# Patient Record
Sex: Male | Born: 1945 | Race: White | Hispanic: No | Marital: Married | Smoking: Never smoker
Health system: Southern US, Community
[De-identification: ages and names within clinical notes are randomized; demographics above are authoritative.]

## PROBLEM LIST (undated history)

## (undated) DIAGNOSIS — I471 Supraventricular tachycardia: Secondary | ICD-10-CM

## (undated) DIAGNOSIS — C61 Malignant neoplasm of prostate: Secondary | ICD-10-CM

## (undated) DIAGNOSIS — G43909 Migraine, unspecified, not intractable, without status migrainosus: Secondary | ICD-10-CM

## (undated) DIAGNOSIS — I10 Essential (primary) hypertension: Secondary | ICD-10-CM

## (undated) DIAGNOSIS — I495 Sick sinus syndrome: Secondary | ICD-10-CM

## (undated) DIAGNOSIS — G250 Essential tremor: Secondary | ICD-10-CM

## (undated) DIAGNOSIS — E785 Hyperlipidemia, unspecified: Secondary | ICD-10-CM

## (undated) DIAGNOSIS — H8110 Benign paroxysmal vertigo, unspecified ear: Secondary | ICD-10-CM

## (undated) DIAGNOSIS — Z9289 Personal history of other medical treatment: Secondary | ICD-10-CM

## (undated) DIAGNOSIS — R32 Unspecified urinary incontinence: Secondary | ICD-10-CM

## (undated) DIAGNOSIS — I440 Atrioventricular block, first degree: Secondary | ICD-10-CM

## (undated) DIAGNOSIS — C801 Malignant (primary) neoplasm, unspecified: Secondary | ICD-10-CM

## (undated) DIAGNOSIS — H93A9 Pulsatile tinnitus, unspecified ear: Secondary | ICD-10-CM

## (undated) DIAGNOSIS — I4892 Unspecified atrial flutter: Principal | ICD-10-CM

## (undated) DIAGNOSIS — I2510 Atherosclerotic heart disease of native coronary artery without angina pectoris: Secondary | ICD-10-CM

## (undated) DIAGNOSIS — Z950 Presence of cardiac pacemaker: Secondary | ICD-10-CM

## (undated) HISTORY — PX: INGUINAL HERNIA REPAIR: SUR1180

## (undated) HISTORY — DX: Pulsatile tinnitus, unspecified ear: H93.A9

## (undated) HISTORY — DX: Hyperlipidemia, unspecified: E78.5

## (undated) HISTORY — DX: Unspecified urinary incontinence: R32

## (undated) HISTORY — PX: CARDIAC CATHETERIZATION: SHX172

## (undated) HISTORY — PX: TONSILLECTOMY AND ADENOIDECTOMY: SUR1326

## (undated) HISTORY — DX: Supraventricular tachycardia: I47.1

## (undated) HISTORY — PX: COLONOSCOPY: SHX174

## (undated) HISTORY — PX: SKIN CANCER EXCISION: SHX779

## (undated) HISTORY — DX: Essential (primary) hypertension: I10

## (undated) HISTORY — PX: SHOULDER SURGERY: SHX246

## (undated) HISTORY — DX: Essential tremor: G25.0

---

## 1979-07-18 HISTORY — PX: HERNIA REPAIR: SHX51

## 1984-02-25 ENCOUNTER — Encounter: Payer: Self-pay | Admitting: Internal Medicine

## 1984-08-19 ENCOUNTER — Encounter: Payer: Self-pay | Admitting: Internal Medicine

## 1984-08-29 ENCOUNTER — Encounter: Payer: Self-pay | Admitting: Internal Medicine

## 2001-01-21 ENCOUNTER — Ambulatory Visit (HOSPITAL_COMMUNITY): Admission: RE | Admit: 2001-01-21 | Discharge: 2001-01-21 | Payer: Self-pay | Admitting: Orthopedic Surgery

## 2001-01-21 ENCOUNTER — Encounter: Payer: Self-pay | Admitting: Orthopedic Surgery

## 2002-12-05 ENCOUNTER — Ambulatory Visit (HOSPITAL_COMMUNITY): Admission: RE | Admit: 2002-12-05 | Discharge: 2002-12-05 | Payer: Self-pay | Admitting: Orthopedic Surgery

## 2002-12-05 ENCOUNTER — Encounter: Payer: Self-pay | Admitting: Orthopedic Surgery

## 2003-02-06 ENCOUNTER — Encounter (INDEPENDENT_AMBULATORY_CARE_PROVIDER_SITE_OTHER): Payer: Self-pay | Admitting: *Deleted

## 2003-02-06 ENCOUNTER — Ambulatory Visit (HOSPITAL_BASED_OUTPATIENT_CLINIC_OR_DEPARTMENT_OTHER): Admission: RE | Admit: 2003-02-06 | Discharge: 2003-02-06 | Payer: Self-pay | Admitting: Orthopedic Surgery

## 2005-06-16 ENCOUNTER — Encounter: Admission: RE | Admit: 2005-06-16 | Discharge: 2005-06-16 | Payer: Self-pay | Admitting: Cardiology

## 2005-11-16 DIAGNOSIS — I471 Supraventricular tachycardia: Secondary | ICD-10-CM

## 2005-11-16 DIAGNOSIS — I4719 Other supraventricular tachycardia: Secondary | ICD-10-CM

## 2005-11-16 DIAGNOSIS — I4892 Unspecified atrial flutter: Principal | ICD-10-CM

## 2005-11-16 HISTORY — DX: Supraventricular tachycardia: I47.1

## 2005-11-16 HISTORY — DX: Other supraventricular tachycardia: I47.19

## 2005-11-16 HISTORY — DX: Unspecified atrial flutter: I48.92

## 2006-01-25 ENCOUNTER — Ambulatory Visit: Payer: Self-pay | Admitting: Gastroenterology

## 2006-01-29 ENCOUNTER — Encounter (INDEPENDENT_AMBULATORY_CARE_PROVIDER_SITE_OTHER): Payer: Self-pay | Admitting: Specialist

## 2006-01-29 ENCOUNTER — Ambulatory Visit: Payer: Self-pay | Admitting: Gastroenterology

## 2006-05-14 ENCOUNTER — Ambulatory Visit: Payer: Self-pay | Admitting: Sports Medicine

## 2006-06-11 ENCOUNTER — Ambulatory Visit (HOSPITAL_COMMUNITY): Admission: RE | Admit: 2006-06-11 | Discharge: 2006-06-11 | Payer: Self-pay | Admitting: Family Medicine

## 2006-06-11 ENCOUNTER — Ambulatory Visit: Payer: Self-pay | Admitting: Sports Medicine

## 2008-11-16 DIAGNOSIS — Z9289 Personal history of other medical treatment: Secondary | ICD-10-CM

## 2008-11-16 HISTORY — DX: Personal history of other medical treatment: Z92.89

## 2009-01-02 ENCOUNTER — Ambulatory Visit (HOSPITAL_COMMUNITY): Admission: RE | Admit: 2009-01-02 | Discharge: 2009-01-02 | Payer: Self-pay | Admitting: Urology

## 2009-02-20 ENCOUNTER — Inpatient Hospital Stay (HOSPITAL_COMMUNITY): Admission: RE | Admit: 2009-02-20 | Discharge: 2009-02-25 | Payer: Self-pay | Admitting: Urology

## 2009-02-20 ENCOUNTER — Encounter (INDEPENDENT_AMBULATORY_CARE_PROVIDER_SITE_OTHER): Payer: Self-pay | Admitting: Urology

## 2009-02-20 HISTORY — PX: PROSTATECTOMY: SHX69

## 2009-06-21 ENCOUNTER — Ambulatory Visit (HOSPITAL_COMMUNITY): Admission: RE | Admit: 2009-06-21 | Discharge: 2009-06-21 | Payer: Self-pay | Admitting: Cardiology

## 2010-02-11 ENCOUNTER — Encounter: Admission: RE | Admit: 2010-02-11 | Discharge: 2010-02-11 | Payer: Self-pay | Admitting: Otolaryngology

## 2010-08-22 ENCOUNTER — Ambulatory Visit: Payer: Self-pay | Admitting: Cardiology

## 2010-10-06 ENCOUNTER — Ambulatory Visit: Payer: Self-pay | Admitting: Cardiology

## 2011-02-22 LAB — PROTIME-INR: INR: 1.5 (ref 0.00–1.49)

## 2011-02-23 ENCOUNTER — Encounter: Payer: Self-pay | Admitting: Gastroenterology

## 2011-02-25 LAB — CBC
HCT: 28.7 % — ABNORMAL LOW (ref 39.0–52.0)
HCT: 29.8 % — ABNORMAL LOW (ref 39.0–52.0)
Hemoglobin: 9.8 g/dL — ABNORMAL LOW (ref 13.0–17.0)
MCHC: 34 g/dL (ref 30.0–36.0)
MCHC: 34.2 g/dL (ref 30.0–36.0)
MCHC: 34.5 g/dL (ref 30.0–36.0)
MCV: 90.4 fL (ref 78.0–100.0)
MCV: 90.5 fL (ref 78.0–100.0)
MCV: 90.9 fL (ref 78.0–100.0)
Platelets: 145 10*3/uL — ABNORMAL LOW (ref 150–400)
Platelets: 161 10*3/uL (ref 150–400)
RBC: 3.18 MIL/uL — ABNORMAL LOW (ref 4.22–5.81)
RBC: 3.2 MIL/uL — ABNORMAL LOW (ref 4.22–5.81)
RBC: 4.5 MIL/uL (ref 4.22–5.81)
RDW: 13.4 % (ref 11.5–15.5)
RDW: 13.4 % (ref 11.5–15.5)
RDW: 13.7 % (ref 11.5–15.5)
WBC: 12.7 10*3/uL — ABNORMAL HIGH (ref 4.0–10.5)
WBC: 8 10*3/uL (ref 4.0–10.5)

## 2011-02-25 LAB — HEMOGLOBIN AND HEMATOCRIT, BLOOD
HCT: 29.5 % — ABNORMAL LOW (ref 39.0–52.0)
Hemoglobin: 10 g/dL — ABNORMAL LOW (ref 13.0–17.0)
Hemoglobin: 11.1 g/dL — ABNORMAL LOW (ref 13.0–17.0)
Hemoglobin: 12 g/dL — ABNORMAL LOW (ref 13.0–17.0)

## 2011-02-25 LAB — DIFFERENTIAL
Basophils Absolute: 0 10*3/uL (ref 0.0–0.1)
Basophils Relative: 0 % (ref 0–1)
Eosinophils Relative: 0 % (ref 0–5)
Lymphocytes Relative: 10 % — ABNORMAL LOW (ref 12–46)
Lymphocytes Relative: 11 % — ABNORMAL LOW (ref 12–46)
Lymphs Abs: 1.4 10*3/uL (ref 0.7–4.0)
Monocytes Relative: 6 % (ref 3–12)
Neutro Abs: 10.6 10*3/uL — ABNORMAL HIGH (ref 1.7–7.7)
Neutrophils Relative %: 83 % — ABNORMAL HIGH (ref 43–77)

## 2011-02-25 LAB — BASIC METABOLIC PANEL
BUN: 8 mg/dL (ref 6–23)
BUN: 9 mg/dL (ref 6–23)
CO2: 27 mEq/L (ref 19–32)
CO2: 27 mEq/L (ref 19–32)
Calcium: 8.1 mg/dL — ABNORMAL LOW (ref 8.4–10.5)
Calcium: 8.3 mg/dL — ABNORMAL LOW (ref 8.4–10.5)
Calcium: 8.5 mg/dL (ref 8.4–10.5)
Chloride: 103 mEq/L (ref 96–112)
Chloride: 105 mEq/L (ref 96–112)
Chloride: 106 mEq/L (ref 96–112)
Creatinine, Ser: 1.11 mg/dL (ref 0.4–1.5)
Creatinine, Ser: 1.14 mg/dL (ref 0.4–1.5)
GFR calc Af Amer: >60 mL/min (ref >60)
GFR calc Af Amer: >60 mL/min (ref >60)
GFR calc Af Amer: >60 mL/min (ref >60)
GFR calc non Af Amer: >60 mL/min (ref >60)
GFR calc non Af Amer: >60 mL/min (ref >60)
GFR calc non Af Amer: >60 mL/min (ref >60)
Glucose, Bld: 135 mg/dL — ABNORMAL HIGH (ref 70–99)
Glucose, Bld: 147 mg/dL — ABNORMAL HIGH (ref 70–99)
Potassium: 4.1 mEq/L (ref 3.5–5.1)
Sodium: 136 mEq/L (ref 135–145)

## 2011-02-25 LAB — CLOSTRIDIUM DIFFICILE EIA

## 2011-02-25 LAB — HEMATOCRIT, BODY FLUID: Hematocrit, Fluid: 16.8 %

## 2011-02-25 LAB — TYPE AND SCREEN

## 2011-02-25 LAB — ABO/RH: ABO/RH(D): O NEG

## 2011-02-25 LAB — PREPARE RBC (CROSSMATCH)

## 2011-02-25 LAB — APTT: aPTT: 26 seconds (ref 24–37)

## 2011-02-26 LAB — URINALYSIS, ROUTINE W REFLEX MICROSCOPIC
Glucose, UA: NEGATIVE mg/dL
Hgb urine dipstick: NEGATIVE
Protein, ur: NEGATIVE mg/dL
Specific Gravity, Urine: 1.013 (ref 1.005–1.030)
pH: 7 (ref 5.0–8.0)

## 2011-02-26 LAB — APTT: aPTT: 31 seconds (ref 24–37)

## 2011-02-26 LAB — CBC
HCT: 45.3 % (ref 39.0–52.0)
Hemoglobin: 15.1 g/dL (ref 13.0–17.0)
MCV: 90.8 fL (ref 78.0–100.0)
RBC: 4.99 MIL/uL (ref 4.22–5.81)
WBC: 4.6 10*3/uL (ref 4.0–10.5)

## 2011-02-26 LAB — COMPREHENSIVE METABOLIC PANEL
Alkaline Phosphatase: 102 U/L (ref 39–117)
BUN: 13 mg/dL (ref 6–23)
CO2: 30 mEq/L (ref 19–32)
Chloride: 104 mEq/L (ref 96–112)
Creatinine, Ser: 1.11 mg/dL (ref 0.4–1.5)
GFR calc non Af Amer: >60 mL/min (ref >60)
Glucose, Bld: 103 mg/dL — ABNORMAL HIGH (ref 70–99)
Total Bilirubin: 2.1 mg/dL — ABNORMAL HIGH (ref 0.3–1.2)

## 2011-02-26 LAB — PROTIME-INR: INR: 1 (ref 0.00–1.49)

## 2011-03-16 ENCOUNTER — Encounter: Payer: Self-pay | Admitting: Cardiology

## 2011-03-16 DIAGNOSIS — I10 Essential (primary) hypertension: Secondary | ICD-10-CM | POA: Insufficient documentation

## 2011-03-16 DIAGNOSIS — R32 Unspecified urinary incontinence: Secondary | ICD-10-CM | POA: Insufficient documentation

## 2011-03-16 DIAGNOSIS — E785 Hyperlipidemia, unspecified: Secondary | ICD-10-CM | POA: Insufficient documentation

## 2011-03-16 DIAGNOSIS — G250 Essential tremor: Secondary | ICD-10-CM | POA: Insufficient documentation

## 2011-03-16 DIAGNOSIS — H93A9 Pulsatile tinnitus, unspecified ear: Secondary | ICD-10-CM | POA: Insufficient documentation

## 2011-03-16 DIAGNOSIS — I471 Supraventricular tachycardia: Secondary | ICD-10-CM | POA: Insufficient documentation

## 2011-03-16 DIAGNOSIS — I4892 Unspecified atrial flutter: Secondary | ICD-10-CM | POA: Insufficient documentation

## 2011-03-23 ENCOUNTER — Ambulatory Visit (INDEPENDENT_AMBULATORY_CARE_PROVIDER_SITE_OTHER): Payer: Medicare Other | Admitting: Cardiology

## 2011-03-23 ENCOUNTER — Encounter: Payer: Self-pay | Admitting: Cardiology

## 2011-03-23 DIAGNOSIS — Z8679 Personal history of other diseases of the circulatory system: Secondary | ICD-10-CM

## 2011-03-23 DIAGNOSIS — I10 Essential (primary) hypertension: Secondary | ICD-10-CM

## 2011-03-23 MED ORDER — PROPRANOLOL HCL 10 MG PO TABS: ORAL_TABLET | ORAL | Status: DC

## 2011-03-23 MED ORDER — TRIAMTERENE-HCTZ 37.5-25 MG PO TABS: 1.0000 | ORAL_TABLET | ORAL | Status: DC

## 2011-03-23 NOTE — Progress Notes (Signed)
Subjective:   [name] is seen today for followup visit. He has a history of atrial flutter with persistence sinus rhythm after cardioversion and flutter ablation in 2007. He's had a history of hyperlipemia as well as hypertension. In general, he's done well. He did have a complicated prostatectomy in April of 2010 and has more or less recovered. He noted that lisinopril caused an unusual side effect of anal sphincter tone reduction. In general, he's been doing well and exercising regularly without complaint .  Few months ago, he had some pauses in his heart rhythm without recurrence.  Current Outpatient Prescriptions  Medication Sig Dispense Refill  . aspirin 325 MG tablet Take 81 mg by mouth daily.       . Cholecalciferol (VITAMIN D PO) Take 1,000 mg by mouth daily.       . Coenzyme Q10 (COQ10 PO) Take by mouth daily.        . Cyanocobalamin (B-12 PO) Take 1,000 mg by mouth daily.       . propranolol (INDERAL) 10 MG tablet Take 10 mg by mouth daily.        Marland Kitchen triamterene-hydrochlorothiazide (MAXZIDE-25) 37.5-25 MG per tablet Take 1 tablet by mouth every other day.          No Known Allergies  Patient Active Problem List  Diagnoses  . Hyperlipidemia  . Pulsatile tinnitus  . Incontinence  . History of atrial flutter  . Familial tremor  . Atrial tachycardia  . Hypertension    History  Smoking status  . Never Smoker   Smokeless tobacco  . Not on file    History  Alcohol Use No    No family history on file.  Review of Systems:   The patient denies any heat or cold intolerance.  No weight gain or weight loss.  The patient denies headaches or blurry vision.  There is no cough or sputum production.  The patient denies dizziness.  There is no hematuria or hematochezia.  The patient denies any muscle aches or arthritis.  The patient denies any rash.  The patient denies frequent falling or instability.  There is no history of depression or anxiety.  All other systems were reviewed and are  negative.   Physical Exam:   Weight is 193. Blood pressure is 130/78 sitting, heart rate 56.The head is normocephalic and atraumatic.  Pupils are equally round and reactive to light.  Sclerae nonicteric.  Conjunctiva is clear.  Oropharynx is unremarkable.  There's adequate oral airway.  Neck is supple there are no masses.  Thyroid is not enlarged.  There is no lymphadenopathy.  Lungs are clear.  Chest is symmetric.  Heart shows a regular rate and rhythm.  S1 and S2 are normal.  There is no murmur click or gallop.  Abdomen is soft normal bowel sounds.  There is no organomegaly.  Genital and rectal deferred.  Extremities are without edema.  Peripheral pulses are adequate.  Neurologically intact.  Full range of motion.  The patient is not depressed.  Skin is warm and dry.  Assessment / Plan:

## 2011-03-23 NOTE — Assessment & Plan Note (Signed)
I will ask him to see Dr. Graciela Husbands in followup in 6 or 8 months. He sees Dr. Theressa Millard for primary care otherwise. No change her current medicines.

## 2011-03-23 NOTE — Assessment & Plan Note (Signed)
Currently stable.

## 2011-03-31 NOTE — Op Note (Signed)
Walter, [name] NO.:  [id number]   MEDICAL RECORD NO.:  [id number]          PATIENT TYPE:  INP   LOCATION:  1443                         FACILITY:  [facility]   PHYSICIAN:  Ronald L. Earlene Plater, M.D.  DATE OF BIRTH:  02/13/1946   DATE OF PROCEDURE:  02/20/2009  DATE OF DISCHARGE:                               OPERATIVE REPORT   DIAGNOSIS:  Adenocarcinoma of the prostate.   OPERATIVE PROCEDURE:  Robotic-assisted laparoscopic radical  prostatectomy with bilateral pelvic lymphadenectomy.   ASSISTANT:  Delia Chimes, NPC.   ANESTHESIA:  General endotracheal.   ESTIMATED BLOOD LOSS:  100 mL.   TUBES:  A large round Blake drain and a 20-French Foley catheter.   COMPLICATIONS:  None.   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  He had a free-to-total  ratio of 19.3%.  he subsequently underwent ultrasound and biopsy of the  prostate which revealed multiple foci of adenocarcinoma.  On the right  side it was primarily Gleason score 6 = 3 +3, and on the left side at  the left lateral mid there was a 10% in one core of 3 + 4 = 7.  There  was another 3 + 3 = 6 from the left side.  His metastatic workup was  negative.  He has been cleared for surgery by Dr. Roger Shelter, his  cardiologist and after understanding the risks, benefits, and  alternatives has elected to proceed with the above procedure.   PROCEDURE IN DETAIL:  The patient was placed in the supine position,  after proper general endotracheal anesthesia, was placed in the  exaggerated lithotomy position and prepped and draped with Betadine in a  sterile fashion.  A 24-French Foley catheter was inserted with a 30-mL  balloon and the bladder was drained.  A periumbilical incision was made  at the proper level.  Sharp dissection was carried down through the  fasciae, and the peritoneum was punctured.  A 12-mm cannula was placed  for the  camera port, and the peritoneum was examined.  There were some  adhesions approximately, but they were not interfering with the  procedure, and no significant abnormalities were noted.  Under direct  vision utilizing the normal procedure, both right and left robotic arms  were placed along with 5-mm and 12-mm right-sided working ports and a  fourth arm was placed in the left abdominal wall.  The robot was  coupled, and the procedure was begun.  Approximately 300 mL of sterile  saline was placed into the bladder.  Both the median and medial  umbilical ligaments were taken down with the hot shears, and the bladder  flap was created.  The space of Retzius was entered and dissected free.  The endopelvic fascia was entered bilaterally and dissected to the apex  of the prostate.  At the left apex there was an accessory artery that  was penetrating, and this had to be clipped and taken to free up the  apex of the prostate.  The  puboprostatic ligaments were partially  incised.  The deep dorsal vein complex was then clamped and cut  utilizing an endovascular stapler, and the prostate was released.  One  ampule of indigo carmine was given IV.   The bladder neck was then taken from the prostate carefully, both  anteriorly and posteriorly.  The ampullae of vas deferens were  identified and incised and the seminal vesicles were carefully  dissected, coagulating them, but carefully avoiding overcoagulation to  prevent damage to the nerves.  The Denonvillier fascia was then taken  down posteriorly, lifting separate to the apex of the prostate.  Both  the right and left nerve spare was then performed with the cold shears  down under the pedicle of the prostate and dissected to the apex of the  prostate.  The pedicles were then taken in serial packets, clipped, and  dissection was carried to the apex of the prostatic.  The urethra was  then sharply incised, and the specimen was removed.  Good hemostasis  was  present.  The area was irrigated.  The rectum was insufflated, and there  were no leaks noted.   Next, the right and left pelvic lymphadenectomy was performed.  Both  obturator and external iliac and hypogastric lymph nodes were taken.  They were clipped proximally and distally.  The vessels and the  obturator nerves were identified and protected bilaterally.  Following  this, the bladder neck/urethral anastomosis was then performed.  The  bladder neck was approximated to the urethra with a holding stitch.  It  might be noted that the bladder neck began to tear.  Therefore, a figure-  of-eight 3-0 Vicryl suture was placed in the bladder neck to buttress it  up, and then with a 2-0 Vicryl suture in the 6 o'clock position, the  bladder neck was approximated to the urethra.  The anastomosis was then  performed with 3-0 Monocryl suture, both dyed and undyed, tied  posteriorly on to the bladder and they were tied anteriorly.  The  anastomosis appeared to be watertight.  It was irrigated.  Blue dye was  seen coming from the bladder.  Good hemostasis was noted to be present.   The specimen was grasped with an EndoCatch bag.  A large round Blake  drain was placed through the fourth arm port and sutured in place with  nylon.  The right 12-mm working port was then closed under direct vision  with 2-0 Vicryl suture with a suture passer, and all ports were removed  under direct vision and good hemostasis was present.  There were no  other abnormalities noted throughout the abdomen, and the drain was in  good position.  The camera was removed, and the specimen was removed  through the camera port.  The fascia of the periumbilical incision was  then closed with a running 2-0 Vicryl suture.  All wounds were  irrigated, injected with 0.25% Marcaine, and closed with skin staples,  and dressed sterilely.  The bladder was noted again easily irrigated  with blue dye from the indigo carmine.  The  patient tolerated the  procedure well.  No complications.  The specimen was submitted  pathology.      Ronald L. Earlene Plater, M.D.  Electronically Signed     RLD/MEDQ  D:  02/20/2009  T:  02/20/2009  Job:  [id number]

## 2011-03-31 NOTE — Cardiovascular Report (Signed)
NAMEKONG, PACKETT NO.:  [id number]   MEDICAL RECORD NO.:  [id number]          PATIENT TYPE:  OIB   LOCATION:  2899                         FACILITY:  [facility]   PHYSICIAN:  Colleen Can. Deborah Chalk, M.D.DATE OF BIRTH:  [birth year]   DATE OF PROCEDURE:  06/21/2009  DATE OF DISCHARGE:                            CARDIAC CATHETERIZATION   PROCEDURE:  Cardioversion.   ANESTHESIA:  Maren Beach, MD, with 60 mg propofol.   PROCEDURE:  Using anterior posterior pads, 20 watt seconds of biphasic  energy were delivered in synchronized manner with conversion to normal  sinus rhythm.  He had underlying first-degree AV block.  Overall, he  tolerated the procedure well.      Colleen Can. Deborah Chalk, M.D.  Electronically Signed     SNT/MEDQ  D:  06/21/2009  T:  06/21/2009  Job:  [id number]

## 2011-03-31 NOTE — H&P (Signed)
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 Deborah Chalk, M.D.DATE OF BIRTH:  [birth year]   DATE OF ADMISSION:  06/21/2009  DATE OF DISCHARGE:                              HISTORY & PHYSICAL   CHIEF COMPLAINT:  Recurrent atrial flutter.   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  He has had previous  ablations in the past.  He presented to our office as a work-in  appointment with recurrent arrhythmia on Wednesday, June 19, 2009.  His  rate has been controlled.  Clinically, he feels fairly well.  He does  note a sensation of palpitations and irregular heartbeat.  He is now  referred for cardioversion.  He has not had any chest pain or shortness  of breath.   PAST MEDICAL HISTORY:  1. He has had a history of atypical chest pain in the past and had      cardiac catheterization many years ago because of a positive stress      study in 1985.  He has had a CT of the coronaries in 2008 which      showed mild nonocclusive calcified plaque in the right coronary and      mild nonocclusive plaque in the mid LAD with distal circumflex      coronary plaque.  2. Hyperlipidemia.  3. Atrial arrhythmias.  He has been referred to [facility] for what was felt to be an ectopic atrial arrhythmia and      had an ablative procedure in 1997.  He has subsequently had an      ablation in 2006 and again in 2007 per Dr. Ronn Melena in [place].  4. Kyphosis.  5. History of T&A in 1950.  6. Inguinal hernia repair in 1946.  7. Umbilical hernia repair in 1989.  8. Prostate cancer status post prostatectomy in April 2010.   ALLERGIES:  Questionable ADVIL but within the setting of taking CECLOR  he subsequently had a rash.   CURRENT MEDICATIONS:  1. Inderal 10  mg a day.  2. Baby aspirin daily.   FAMILY HISTORY:  Father died at 31 with suicide following a stroke.  He  had had a heart attack at the age 45.   SOCIAL HISTORY:  He is married.  He is a retired [employer] social research officer.  He has  no tobacco use.  He has rare alcohol use.   REVIEW OF SYSTEMS:  He denies chest pain or shortness of breath.  He  does note irregular heartbeat and palpitations.  He has had some  incontinence and diarrhea since his prostate surgery.  He has had some  issues with rash.  All other review of systems are negative.   PHYSICAL EXAMINATION:  GENERAL:  He is in no acute distress, he is  pleasant, and conversive.  VITAL SIGNS:  Weight is 195 pounds, blood pressure 120/80 sitting,  118/76 standing.  Heart rate in the 80s and irregular, respirations 80,  and is afebrile.  SKIN:  Warm and dry.  Color is unremarkable.  HEENT:  He is normocephalic and atraumatic.  Pupils are equal and  reactive.  Sclerae is nonicteric.  NECK:  Supple.  No masses.  No JVD.  LUNGS:  Clear.  HEART:  Irregularly irregular rhythm.  ABDOMEN:  Soft, positive bowel sounds, and nontender.  EXTREMITIES:  Without edema.  NEUROLOGIC:  No gross focal deficits.  MUSCULOSKELETAL:  Gait and range of motion to be intact.  Strength is  symmetric.   LABORATORY DATA:  Pertinent labs are pending.   OVERALL IMPRESSION:  1. Recurrent atrial flutter with controlled ventricular response.  2. Past history of ablation.  3. History of prostate cancer status post recent prostatectomy.   PLAN:  We will proceed on with attempts at cardioversion.  We will try  to arrange that for this coming Friday.  He is started on Lovenox 100 mg  b.i.d. as well as Coumadin 5 mg a day.  Procedure has been reviewed in  full detail and he is willing to proceed on Friday, June 21, 2009.      Sharlee Blew, N.P.      Colleen Can. Deborah Chalk, M.D.  Electronically Signed    LC/MEDQ  D:  06/19/2009  T:  06/20/2009  Job:   [id number]

## 2011-03-31 NOTE — Op Note (Signed)
[name], BONEBRAKE NO.:  [id number]   MEDICAL RECORD NO.:  [id number]          PATIENT TYPE:  INP   LOCATION:  1443                         FACILITY:  [facility]   PHYSICIAN:  Ronald L. Earlene Plater, M.D.  DATE OF BIRTH:  July 08, 1946   DATE OF PROCEDURE:  02/21/2009  DATE OF DISCHARGE:                               OPERATIVE REPORT   DIAGNOSIS:  Hematoma post robotic assisted laparoscopic radical  prostatectomy.   OPERATIVE PROCEDURE:  Exploratory laparotomy with evacuation of clot.   SURGEON:  Dr. Gaynelle Arabian.   ASSISTANTS:  Heloise Purpura, MD and Alessandra Bevels. Chase Picket, FNP-C.   ANESTHESIA:  General endotracheal.   ESTIMATED BLOOD LOSS:  500 mL of old clot.   COMPLICATIONS:  None.   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  He underwent a robotic  assisted laparoscopic radical prostatectomy and bilateral pelvic  lymphadenectomy yesterday.  Over the night, his drainage had increased  and his hemoglobin fell to approximately 10.  He became orthostatic and  was transfused.  CT scan revealed a moderate to large hematoma overlying  the bladder.  He had discomfort and with orthostasis and the fact that  we needed to transfuse him, after understanding the risks, benefits and  alternatives, he has elected to proceed with the above procedure.   PROCEDURE IN DETAIL:  The patient was placed in the supine position.  After proper general endotracheal anesthesia, he was placed in the  supine position and prepped and draped with Betadine in a sterile  fashion.  A midline lower abdominal incision was made.  Sharp dissection  was carried down through subcutaneous tissue.  The fascia was incised.  The rectus muscle bellies were retracted laterally and the peritoneum  was entered.  Approximately 500 mL of old clots were noted in the right  and left gutters and posterior to the bladder.  Anterior bladder  and  bladder neck area and anastomotic area appeared to be without clots.  Clots were evacuated and all areas of surgery were examined.  There was  no clot noted in the area of lymph node dissection.  All port sites were  examined and noted to be without clots and there was no active bleeding  noted to be coming from the bladder or the bladder flap.  Thorough  irrigation was performed with warm normal saline and the drain was  maintained in place.  The drain site was also identified and noted be  not actively bleeding.  The bladder flap was placed over the  retroperitoneum.  Again irrigation was performed and the wound was  closed.  The rectus muscle bellies were approximated in the midline with  interrupted 2-0 chromic catgut.  The fascia was closed with a running #1  PDS.  The area was irrigated.  The skin was closed with skin staples.  The  wounds were dressed sterilely and the patient was taken to the recovery  room stable.  All lap, sponge, instrument and needle counts were  correct.  The  Foley was irrigated and noted to be clear.  The patient  was taken to the recovery room stable.      Ronald L. Earlene Plater, M.D.  Electronically Signed     RLD/MEDQ  D:  02/21/2009  T:  02/21/2009  Job:  [id number]

## 2011-04-03 NOTE — Op Note (Signed)
[name], [name]                              ACCOUNT NO.:  [id number]   MEDICAL RECORD NO.:  [id number]                   PATIENT TYPE:  AMB   LOCATION:  DSC                                  FACILITY:  [facility]   PHYSICIAN:  Katy Fitch. Naaman Plummer., M.D.          DATE OF BIRTH:  September 20, 1946   DATE OF PROCEDURE:  02/06/2003  DATE OF DISCHARGE:                                 OPERATIVE REPORT   PREOPERATIVE DIAGNOSIS:  Chronic stage II or III impingement, right shoulder  with plain film and MRI evidence of significant acromioclavicular  arthropathy and significant tendinopathy of the supraspinatus and  infraspinatus tendons.   POSTOPERATIVE DIAGNOSIS:  1. Chronic stage II or III impingement, right shoulder with plain film and     MRI evidence of significant acromioclavicular arthropathy and significant     tendinopathy of the supraspinatus and infraspinatus tendons.  2. Minor adhesive capsulitis and partial deep surface tear of supraspinatus     and infraspinatus tendons.   OPERATION PERFORMED:  1. Diagnostic arthroscopy, right glenohumeral joint with arthroscopic     debridement of minor labral debris, anterior capsular adhesions and     debridement of deep surface supraspinatus and infraspinatus rotator cuff     tears.  2. Arthroscopic subacromial decompression with coracoacromial ligament     resection, acromioplasty and bursectomy.  3. Open resection of distal clavicle, i.e. Mumford procedure.   SURGEON:  Katy Fitch. Sypher, M.D.   ASSISTANT:  Jonni Sanger, P.A.   ANESTHESIA:  General orotracheal anesthesia supplemented by interscalene  block.   SUPERVISING ANESTHESIOLOGIST:  Janetta Hora. Gelene Mink, M.D.   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  He has had a history of  bilateral impingement symptoms and is status post arthroscopic subacromial  decompression and distal clavicle resection on the  left.  He had a  satisfactory response to surgery on the left and recently returned with  symptoms of impingement on the right.   Clinical examination revealed signs of significant AC arthropathy and  chronic stage II impingement.  An MRI of his shoulder was obtained December 09, 2002 at [facility].  This was interpreted to reveal  subacromial spurring with acromial sloping anteriorly and signs of chronic  impingement.  Due to a chronic stage II impingement predicament, the patient  requested proceeding with arthroscopic decompression at this time.   Give the fact that he is right-hand dominant and would like to return to  surgery as soon as possible, I recommended proceeding with open resection of  the distal clavicle and repair of the dead space between the deltoid and  trapezius utilizing Kevlar sutures to facilitate early mobilization of the  shoulder.   DESCRIPTION OF PROCEDURE:  The patient was brought to the operating room and  placed in supine position upon the operating table.  Following interscalene  block in the holding area, anesthesia was satisfactory in the right  forequarter.  Following the induction of general orotracheal anesthesia, he  was carefully positioned in a beach chair position with the aid of a torso  and headholder designed for shoulder arthroscopy.  The right upper extremity  and forequarter were prepped with DuraPrep and draped with impervious  arthroscopy drapes.   The procedure commenced with the open Mumford procedure.  A 2 cm incision  was fashioned directly over the distal clavicle.  The subcutaneous tissue  revealed the capsule of the [facility] joint.  An incision was made and a periosteal  elevator used to expose the distal 2 cm of clavicle.  The clavicle was  carefully exposed with Glorious Peach elevators and a Art therapist followed by  placement of baby Radiation protection practitioner.  An oscillating saw was used to remove  the distal 18 mm of clavicle.   The clavicle was quite degenerative.  The  meniscus was completely lost.  The space created by the distal clavicle  resection was closed with a mattress suture of #2 Kevlar vertical mattress  with knots buried deep.  The wound was then repaired with subdermal sutures  of 2-0 Vicryl and intradermal 3-0 Prolene.   Attention was then directed to the shoulder.  The arthroscope was introduced  with standard blunt technique through a posterior portal.  Diagnostic  arthroscopy revealed intact hyaline intra-articular cartilage surfaces from  the humeral head and glenoid.  The labrum had minor debris anteriorly that  was cleaned with a suction shaver brought into an anterior portal under  direct vision.   Each surface of the rotator cuff had degenerative tearing and some free  fragments of tendon having within the joint.  These were debrided with a 4.5  mm suction shaver.  The anterior glenohumeral ligaments were intact.  The  labrum was intact 360 degrees.  The biceps anchor was intact and the biceps  tendon was normal to the bicipital interval.  The inferior recess was  normal.   The arthroscopic equipment was removed from the glenohumeral joint and  placed in the subacromial space.  A florid bursitis was noted.  After  bursectomy, the anatomy of the coracoacromial arch was reviewed.  The medial  spur at acromioclavicular joint was removed with the suction shaver followed  by release of the coracoacromial ligament.  Hemostasis was achieved with  bipolar cautery.   The acromion was leveled to a type 1 morphology using a suction and bur.  This was accomplished both through lateral and posterior approach.  The  bursa was then debrided and the cuff inspected.  There was considerable  abrasion on the cuff at the site of the previous impingement.  The  hypertrophic bursa was excised.  There was no sign of a full thickness cuff  tear.  Hemostasis was achieved with bipolar cautery followed by  removal of the  arthroscopic equipment.  The portals were repaired with 3-0 Prolene.  0.25%  Marcaine was infiltrated in the portals for postoperative analgesia.  The  patient was then awakened from anesthesia and transferred to recovery room  with stable vital signs.  There were no apparent complications.                                                Katy Fitch Sypher Montez Hageman.,  M.D.    RVS/MEDQ  D:  02/06/2003  T:  02/06/2003  Job:  [id number]

## 2011-04-03 NOTE — Discharge Summary (Signed)
NAMERAFFAELE, DERISE NO.:  [id number]   MEDICAL RECORD NO.:  [id number]          PATIENT TYPE:  INP   LOCATION:  1443                         FACILITY:  [facility]   PHYSICIAN:  Ronald L. Earlene Plater, M.D.  DATE OF BIRTH:  28-Nov-1945   DATE OF ADMISSION:  02/20/2009  DATE OF DISCHARGE:  02/25/2009                               DISCHARGE SUMMARY   ADMISSION DIAGNOSIS:  Adenocarcinoma of the prostate.   DISCHARGE DIAGNOSES:  1. Adenocarcinoma of prostate.  2. Post-hemorrhagic anemia which has resolved.  3. Hyperkalemia which has resolved.   PROCEDURES:  1. Robotic assisted laparoscopic radical prostatectomy.  2. Bilateral pelvic lymphadenectomy.  3. Laparotomy for evacuation of clot hematoma.   HISTORY AND PHYSICAL:  For full details please see admission history and  physical.  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  After  careful consideration regarding management options for treatment, he  elected to proceed with surgical therapy and a robotic-assisted  laparoscopic radical prostatectomy.   HOSPITAL COURSE:  On February 20, 2009, he was taken to the operating room  and underwent the above-named procedure, which he tolerated well and  without complications.  Postoperatively he was able to be transferred to  a regular hospital room following recovery from anesthesia.  Postoperatively he voiced no complaints and vital signs were stable,  although he had an increased output from his pelvic drain and decreased  urine output.  He was hemodynamically stable as noted by his hemoglobin  of 13.8.  Because of the increased output from the pelvic drain, he  remained on bed rest overnight and his hemoglobin and hematocrit were  rechecked later that evening and found to be slightly low at 11.1.  Overnight, he continued to have increased output from his pelvic drain.  Therefore, the Foley catheter was  placed on EZ traction and ultimately  the drainage from the pelvic drain decreased.  His hemoglobin was  rechecked on postoperative day #1 and found to be again slightly lower  than postoperatively at 10.2.  His potassium was also found to be  elevated at 5.5 and his creatinine was normal at 1.20.  Therefore, his  IV fluids were changed to D5-1/2 normal saline to bring down his  potassium.  He was reassessed on the afternoon of postoperative day #1  and had complaints of dizzy and lightheadedness.  His blood pressure was  low, 100s/low 60s, and hemoglobin and hematocrit were rechecked and  found to be lower than earlier that morning at 10.0.  CT of the abdomen  and pelvis was performed, which showed moderate amount of hemoperitoneum  in the urinary bladder dome.  Because of his complaints of  lightheadedness and low blood pressures as well as decreasing  hemoglobin, he was transfused with 2 units of packed red blood cells.  Later that evening he was taken back to the operating room where he  underwent a laparotomy for evacuation of hematoma.  Approximately 500 mL  of clot were removed at that time.  On  postoperative day #2, he was  feeling much better and had no complaints.  He was able to begin  ambulation that day.  His lab work was checked and hemoglobin had  remained stable at 9.8.  His potassium was also stable at 4.3.  He was  able to be transitioned to a clear liquid diet, which he tolerated  without nausea or vomiting.  He also continued to ambulate.  His  hemoglobin remained stable throughout the rest of the hospitalization at  9.9.  The drainage from his pelvic drain did continue to decrease.  Therefore, the drain was removed prior to going home.  He was  transitioned from his PCA for pain medication to oral pain medicine,  which he tolerated well.  His urine output, blood pressure and pulse all  continued to remain stable.  Therefore, he was felt to be stable for  discharge as  he had met all discharge criteria.   DISPOSITION:   DISCHARGE MEDICATIONS:  He was instructed to resume his regular home  medications, consisting of propranolol and his aspirin.  He was to  restart his multivitamins and saw palmetto in 7 days.  He was also  provided a prescription for Vicodin to take as needed for pain and told  the use Colace as a stool softener.  In addition, he was provided a  prescription for antibiotic to take for the next 7 days.   DISCHARGE INSTRUCTIONS:  He was instructed to be ambulatory but  specifically told to refrain from any heavy lifting, strenuous activity  or driving.  He was instructed on routine Foley catheter care and told  to gradually advance his diet over the course of the next few days.   FOLLOWUP:  He will follow up in 5 days for removal of Foley catheter and  skin staples.      Delia Chimes, NP      Lucrezia Starch. Earlene Plater, M.D.  Electronically Signed    MA/MEDQ  D:  04/17/2009  T:  04/17/2009  Job:  [id number]

## 2011-04-28 ENCOUNTER — Encounter: Payer: Self-pay | Admitting: Gastroenterology

## 2011-05-26 ENCOUNTER — Ambulatory Visit (AMBULATORY_SURGERY_CENTER): Payer: Medicare Other

## 2011-05-26 VITALS — Ht 72.0 in | Wt 196.1 lb

## 2011-05-26 DIAGNOSIS — Z800 Family history of malignant neoplasm of digestive organs: Secondary | ICD-10-CM

## 2011-05-26 MED ORDER — PEG-KCL-NACL-NASULF-NA ASC-C 100 G PO SOLR: 1.0000 | Freq: Once | ORAL | Status: AC

## 2011-06-09 ENCOUNTER — Ambulatory Visit (AMBULATORY_SURGERY_CENTER): Payer: Medicare Other | Admitting: Gastroenterology

## 2011-06-09 ENCOUNTER — Encounter: Payer: Self-pay | Admitting: Gastroenterology

## 2011-06-09 VITALS — BP 132/84 | HR 55 | Temp 97.4°F | Resp 18 | Ht 72.0 in | Wt 190.0 lb

## 2011-06-09 DIAGNOSIS — Z800 Family history of malignant neoplasm of digestive organs: Secondary | ICD-10-CM

## 2011-06-09 DIAGNOSIS — D126 Benign neoplasm of colon, unspecified: Secondary | ICD-10-CM

## 2011-06-09 DIAGNOSIS — Z1211 Encounter for screening for malignant neoplasm of colon: Secondary | ICD-10-CM

## 2011-06-09 DIAGNOSIS — K5730 Diverticulosis of large intestine without perforation or abscess without bleeding: Secondary | ICD-10-CM

## 2011-06-09 MED ORDER — SODIUM CHLORIDE 0.9 % IV SOLN: 500.0000 mL | INTRAVENOUS | Status: DC

## 2011-06-09 NOTE — Patient Instructions (Signed)
Please refer to your blue and neon green sheets for instructions regarding diet and activity for the rest of today.  Please eat a diet that is high in fiber.  Repeat Exam in 5 years (2017).

## 2011-06-10 ENCOUNTER — Telehealth: Payer: Self-pay | Admitting: *Deleted

## 2011-06-10 NOTE — Telephone Encounter (Signed)

## 2011-11-02 DIAGNOSIS — N393 Stress incontinence (female) (male): Secondary | ICD-10-CM | POA: Insufficient documentation

## 2011-11-02 DIAGNOSIS — N529 Male erectile dysfunction, unspecified: Secondary | ICD-10-CM | POA: Insufficient documentation

## 2011-11-27 ENCOUNTER — Encounter: Payer: Self-pay | Admitting: Internal Medicine

## 2011-11-27 ENCOUNTER — Ambulatory Visit (INDEPENDENT_AMBULATORY_CARE_PROVIDER_SITE_OTHER): Payer: Medicare Other | Admitting: Internal Medicine

## 2011-11-27 VITALS — BP 140/88 | HR 48 | Ht 72.0 in | Wt 197.8 lb

## 2011-11-27 DIAGNOSIS — I471 Supraventricular tachycardia: Secondary | ICD-10-CM

## 2011-11-27 DIAGNOSIS — I498 Other specified cardiac arrhythmias: Secondary | ICD-10-CM

## 2011-11-27 NOTE — Progress Notes (Signed)
  HPI  [name] is a 66 y.o. male Is seen today to establish arrhythmia followup.  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 Marcus Wharton. He then developed an atrial flutter which was apparently somehow related to the prior ablation underwent catheter ablation and and required re\re ablation 2 years later 2007. He has had recurrent flutter on one occasion in 2010 prompting cardioversion.  He also has multiple other arrhythmia things in the context of his bradycardia. He's had PVCs and other flutters.  His exercise tolerance is quite good not withstanding his resting bradycardia. And first degree AV block His peak heart rate is up to the 130s. His left ventricular function is normal he has no known coronary disease  Past Medical History  Diagnosis Date  . Hyperlipidemia   . Pulsatile tinnitus   . 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  . History of atrial flutter 2007    ABLATION  . Familial tremor   . Atrial tachycardia 2007    ANTEROSEPTAL ABLATION  . Hypertension     Past Surgical History  Procedure Date  . Prostatectomy 02/20/2009    COMPLICATED  . Hernia repair     left side  . Umbilical hernia repair   . Shoulder surgery     Bilateral  . Atrial ablation surgery   . Tonsillectomy and adenoidectomy   . Colonoscopy     Current Outpatient Prescriptions  Medication Sig Dispense Refill  . aspirin 81 MG tablet Take 160 mg by mouth daily.      . Cholecalciferol (VITAMIN D PO) Take 1,000 mg by mouth daily.       . Coenzyme Q10 (COQ10 PO) Take by mouth daily.        . Cyanocobalamin (B-12 PO) Take 1,000 mg by mouth daily.       . doxycycline (DORYX) 100 MG DR capsule Take 100 mg by mouth 2 (two) times daily.      . propranolol (INDERAL) 10 MG tablet 1 tablet three times daily as directed  90 tablet  4  . triamterene-hydrochlorothiazide (MAXZIDE-25)  37.5-25 MG per tablet Take 1 tablet by mouth every other day.  30 tablet  11   Current Facility-Administered Medications  Medication Dose Route Frequency Provider Last Rate Last Dose  . 0.9 %  sodium chloride infusion  500 mL Intravenous Continuous Robert D Kaplan, MD        Allergies  Allergen Reactions  . Ceclor (Cefaclor) Hives    Review of Systems negative except from HPI and PMH  Physical Exam BP 140/88  Pulse 48  Ht 6' (1.829 m)  Wt 197 lb 12.8 oz (89.721 kg)  BMI 26.83 kg/m2 Well developed and well nourished in no acute distress HENT normal E scleral and icterus clear Neck Supple JVP flat; carotids brisk and full Clear to ausculation Regular rate and rhythm, no murmurs gallops or rub Soft with active bowel sounds No clubbing cyanosis none Edema Alert and oriented, grossly normal motor and sensory function Skin Warm and Dry  Sinus rhtyhm with 1AVB Assessment and  Plan  

## 2011-11-27 NOTE — Patient Instructions (Signed)
Your physician recommends that you continue on your current medications as directed. Please refer to the Current Medication list given to you today.  Your physician wants you to follow-up in: 6 months with Dr. Klein. You will receive a reminder letter in the mail two months in advance. If you don't receive a letter, please call our office to schedule the follow-up appointment.  

## 2011-12-03 ENCOUNTER — Encounter (HOSPITAL_COMMUNITY): Payer: Self-pay | Admitting: Pharmacy Technician

## 2011-12-03 ENCOUNTER — Other Ambulatory Visit: Payer: Self-pay

## 2011-12-03 ENCOUNTER — Encounter (HOSPITAL_COMMUNITY): Payer: Self-pay | Admitting: Certified Registered"

## 2011-12-03 ENCOUNTER — Encounter (HOSPITAL_COMMUNITY)
Admission: RE | Disposition: A | Discharge status: Home / Self Care | Payer: Self-pay | Source: Ambulatory Visit | Attending: Internal Medicine

## 2011-12-03 ENCOUNTER — Telehealth: Payer: Self-pay | Admitting: Internal Medicine

## 2011-12-03 ENCOUNTER — Ambulatory Visit (HOSPITAL_COMMUNITY): Payer: Medicare Other | Admitting: Certified Registered"

## 2011-12-03 ENCOUNTER — Ambulatory Visit (INDEPENDENT_AMBULATORY_CARE_PROVIDER_SITE_OTHER): Payer: Medicare Other

## 2011-12-03 ENCOUNTER — Ambulatory Visit (HOSPITAL_COMMUNITY): Payer: Medicare Other

## 2011-12-03 ENCOUNTER — Encounter: Payer: Self-pay | Admitting: Internal Medicine

## 2011-12-03 ENCOUNTER — Encounter: Payer: Self-pay | Admitting: *Deleted

## 2011-12-03 ENCOUNTER — Ambulatory Visit (HOSPITAL_COMMUNITY)
Admission: RE | Admit: 2011-12-03 | Discharge: 2011-12-03 | Disposition: A | Discharge status: Home / Self Care | Payer: Medicare Other | Source: Ambulatory Visit | Attending: Internal Medicine | Admitting: Internal Medicine

## 2011-12-03 VITALS — BP 122/78 | HR 69 | Wt 198.5 lb

## 2011-12-03 DIAGNOSIS — I4892 Unspecified atrial flutter: Secondary | ICD-10-CM | POA: Insufficient documentation

## 2011-12-03 DIAGNOSIS — Z8679 Personal history of other diseases of the circulatory system: Secondary | ICD-10-CM

## 2011-12-03 HISTORY — PX: CARDIOVERSION: SHX1299

## 2011-12-03 LAB — PROTIME-INR: Prothrombin Time: 14.1 seconds (ref 11.6–15.2)

## 2011-12-03 LAB — BASIC METABOLIC PANEL
Chloride: 101 mEq/L (ref 96–112)
GFR calc Af Amer: 88 mL/min — ABNORMAL LOW (ref >90)
Potassium: 4.4 mEq/L (ref 3.5–5.1)
Sodium: 136 mEq/L (ref 135–145)

## 2011-12-03 LAB — DIFFERENTIAL
Basophils Absolute: 0 10*3/uL (ref 0.0–0.1)
Basophils Relative: 0 % (ref 0–1)
Neutro Abs: 3.7 10*3/uL (ref 1.7–7.7)
Neutrophils Relative %: 69 % (ref 43–77)

## 2011-12-03 LAB — CBC
Hemoglobin: 15.3 g/dL (ref 13.0–17.0)
MCHC: 35.8 g/dL (ref 30.0–36.0)
Platelets: 176 10*3/uL (ref 150–400)
RDW: 12.6 % (ref 11.5–15.5)

## 2011-12-03 LAB — MAGNESIUM: Magnesium: 2.1 mg/dL (ref 1.5–2.5)

## 2011-12-03 SURGERY — CARDIOVERSION
Anesthesia: Monitor Anesthesia Care | Wound class: Clean

## 2011-12-03 MED ORDER — PROPOFOL 10 MG/ML IV EMUL
INTRAVENOUS | Status: DC | PRN
  Administered 2011-12-03: 50 mg via INTRAVENOUS

## 2011-12-03 MED ORDER — LACTATED RINGERS IV SOLN
INTRAVENOUS | Status: DC | PRN
  Administered 2011-12-03: 14:00:00 via INTRAVENOUS

## 2011-12-03 MED ORDER — HYDROCORTISONE 1 % EX CREA: 1.0000 "application " | TOPICAL_CREAM | Freq: Three times a day (TID) | CUTANEOUS | Status: DC | PRN

## 2011-12-03 MED ORDER — HYDROCORTISONE 1 % EX CREA
TOPICAL_CREAM | CUTANEOUS | Status: AC
  Filled 2011-12-03: qty 28

## 2011-12-03 MED ORDER — SODIUM CHLORIDE 0.9 % IV SOLN: 250.0000 mL | INTRAVENOUS | Status: DC

## 2011-12-03 MED ORDER — SODIUM CHLORIDE 0.9 % IJ SOLN: 3.0000 mL | INTRAMUSCULAR | Status: DC | PRN

## 2011-12-03 MED ORDER — SODIUM CHLORIDE 0.9 % IV SOLN
INTRAVENOUS | Status: DC
  Administered 2011-12-03: 13:00:00 via INTRAVENOUS

## 2011-12-03 MED ORDER — SODIUM CHLORIDE 0.9 % IJ SOLN: 3.0000 mL | Freq: Two times a day (BID) | INTRAMUSCULAR | Status: DC

## 2011-12-03 NOTE — Transfer of Care (Signed)
Immediate Anesthesia Transfer of Care Note  Patient: [name]  Procedure(s) Performed:  CARDIOVERSION  Patient Location: PACU and Short Stay  Anesthesia Type: MAC  Level of Consciousness: awake  Airway & Oxygen Therapy: Patient Spontanous Breathing  Post-op Assessment: Post -op Vital signs reviewed and stable  Post vital signs: stable Filed Vitals:   12/03/11 1248  BP: 107/71  Pulse: 67  Temp: 36.1 C  Resp: 18    Complications: No apparent anesthesia complications

## 2011-12-03 NOTE — Telephone Encounter (Signed)
The patient walked in to the office. Will document in the nurse room.

## 2011-12-03 NOTE — Anesthesia Preprocedure Evaluation (Addendum)
Anesthesia Evaluation  Patient identified by MRN, date of birth, ID band Patient awake    Reviewed: Allergy & Precautions, H&P , NPO status , Patient's Chart, lab work & pertinent test results  Airway Mallampati: II TM Distance: >3 FB Neck ROM: Full    Dental  (+) Teeth Intact and Dental Advisory Given   Pulmonary neg pulmonary ROS,    Pulmonary exam normal       Cardiovascular hypertension, Pt. on medications + dysrhythmias irregular Normal    Neuro/Psych Negative Neurological ROS  Negative Psych ROS   GI/Hepatic negative GI ROS, Neg liver ROS,   Endo/Other  Negative Endocrine ROS  Renal/GU   Genitourinary negative   Musculoskeletal   Abdominal Normal abdominal exam  (+)   Peds  Hematology negative hematology ROS (+)   Anesthesia Other Findings   Reproductive/Obstetrics                          Anesthesia Physical Anesthesia Plan  ASA: II  Anesthesia Plan: General   Post-op Pain Management:    Induction:   Airway Management Planned:   Additional Equipment:   Intra-op Plan:   Post-operative Plan:   Informed Consent: I have reviewed the patients History and Physical, chart, labs and discussed the procedure including the risks, benefits and alternatives for the proposed anesthesia with the patient or authorized representative who has indicated his/her understanding and acceptance.     Plan Discussed with: Anesthesiologist, CRNA and Surgeon  Anesthesia Plan Comments:        Anesthesia Quick Evaluation

## 2011-12-03 NOTE — Assessment & Plan Note (Signed)
Recurrent but infrequent episodes  Continue current strategy

## 2011-12-03 NOTE — H&P (View-Only) (Signed)
  HPI  [name] is a 66 y.o. male Is seen today to establish arrhythmia followup.  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 Ronn Melena. He then developed an atrial flutter which was apparently somehow related to the prior ablation underwent catheter ablation and and required re\re ablation 2 years later 2007. He has had recurrent flutter on one occasion in 2010 prompting cardioversion.  He also has multiple other arrhythmia things in the context of his bradycardia. He's had PVCs and other flutters.  His exercise tolerance is quite good not withstanding his resting bradycardia. And first degree AV block His peak heart rate is up to the 130s. His left ventricular function is normal he has no known coronary disease  Past Medical History  Diagnosis Date  . Hyperlipidemia   . Pulsatile tinnitus   . 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  . History of atrial flutter 2007    ABLATION  . Familial tremor   . Atrial tachycardia 2007    ANTEROSEPTAL ABLATION  . Hypertension     Past Surgical History  Procedure Date  . Prostatectomy 02/20/2009    COMPLICATED  . Hernia repair     left side  . Umbilical hernia repair   . Shoulder surgery     Bilateral  . Atrial ablation surgery   . Tonsillectomy and adenoidectomy   . Colonoscopy     Current Outpatient Prescriptions  Medication Sig Dispense Refill  . aspirin 81 MG tablet Take 160 mg by mouth daily.      . Cholecalciferol (VITAMIN D PO) Take 1,000 mg by mouth daily.       . Coenzyme Q10 (COQ10 PO) Take by mouth daily.        . Cyanocobalamin (B-12 PO) Take 1,000 mg by mouth daily.       Marland Kitchen doxycycline (DORYX) 100 MG DR capsule Take 100 mg by mouth 2 (two) times daily.      . propranolol (INDERAL) 10 MG tablet 1 tablet three times daily as directed  90 tablet  4  . triamterene-hydrochlorothiazide (MAXZIDE-25)  37.5-25 MG per tablet Take 1 tablet by mouth every other day.  30 tablet  11   Current Facility-Administered Medications  Medication Dose Route Frequency Provider Last Rate Last Dose  . 0.9 %  sodium chloride infusion  500 mL Intravenous Continuous Louis Meckel, MD        Allergies  Allergen Reactions  . Ceclor (Cefaclor) Hives    Review of Systems negative except from HPI and PMH  Physical Exam BP 140/88  Pulse 48  Ht 6' (1.829 m)  Wt 197 lb 12.8 oz (89.721 kg)  BMI 26.83 kg/m2 Well developed and well nourished in no acute distress HENT normal E scleral and icterus clear Neck Supple JVP flat; carotids brisk and full Clear to ausculation Regular rate and rhythm, no murmurs gallops or rub Soft with active bowel sounds No clubbing cyanosis none Edema Alert and oriented, grossly normal motor and sensory function Skin Warm and Dry  Sinus rhtyhm with 1AVB Assessment and  Plan

## 2011-12-03 NOTE — Preoperative (Signed)
Beta Blockers   Reason not to administer Beta Blockers:Not Applicable 

## 2011-12-03 NOTE — Telephone Encounter (Signed)
I spoke with Dr. Lenard Forth. He will follow up with Dr. Graciela Husbands on 01/12/12 at 2:30pm.

## 2011-12-03 NOTE — Telephone Encounter (Signed)
New Msg: Pt calling stating that he is in a-fib/flutter and slightly sob. Please return pt call to discuss further.

## 2011-12-03 NOTE — Interval H&P Note (Signed)
History and Physical Interval Note:  12/03/2011 2:24 PM  [name]  has presented today for surgery, with the diagnosis of a flutter  The various methods of treatment have been discussed with the patient and family. After consideration of risks, benefits and other options for treatment, the patient has consented to  Procedure(s): CARDIOVERSION as a surgical intervention .  The patients' history has been reviewed, patient examined, no change in status, stable for surgery.  I have reviewed the patients' chart and labs.  Questions were answered to the patient's satisfaction.     Sherryl Manges

## 2011-12-03 NOTE — Telephone Encounter (Signed)
New Problem   Please return call to patient who has questions about procedure. Patient can be reach on hm#

## 2011-12-03 NOTE — Telephone Encounter (Signed)
I left a message for the patient to call. 

## 2011-12-03 NOTE — Op Note (Signed)
preop doiagnosis  Aflutter  Post op Afib  Propafol 50 Mg  DCCV synch  50J >>>>SR

## 2011-12-03 NOTE — Anesthesia Postprocedure Evaluation (Signed)
  Anesthesia Post-op Note  Patient: [name]  Procedure(s) Performed:  CARDIOVERSION  Patient Location: PACU and Short Stay  Anesthesia Type: MAC  Level of Consciousness: awake  Airway and Oxygen Therapy: Patient Spontanous Breathing  Post-op Pain: none  Post-op Assessment: Post-op Vital signs reviewed  Post-op Vital Signs: stable  Complications: No apparent anesthesia complications

## 2011-12-03 NOTE — Patient Instructions (Signed)
Your physician has recommended that you have a Cardioversion (DCCV) today. Electrical Cardioversion uses a jolt of electricity to your heart either through paddles or wired patches attached to your chest. This is a controlled, usually prescheduled, procedure. Defibrillation is done under light anesthesia in the hospital, and you usually go home the day of the procedure. This is done to get your heart back into a normal rhythm. You are not awake for the procedure. Please see the instruction sheet given to you today.

## 2011-12-03 NOTE — Progress Notes (Signed)
The patient called the office this morning and then walked in with complaints of not feeling well and being out of rhythm. He states that about 4 hours ago, he woke up feeling like he couldn't breath well and that his heart rate was irregular. EKG today reveals a-flutter with 4:1 AV conduction. I reviewed with  Dr. Graciela Husbands over the phone. He recommends that the patient come for cardioversion today. The patient is willing to proceed with this and has been NPO. He will be cardioverted today at 2:00pm at [facility] Stay.

## 2011-12-04 ENCOUNTER — Encounter (HOSPITAL_COMMUNITY): Payer: Self-pay | Admitting: Internal Medicine

## 2012-01-12 ENCOUNTER — Encounter: Payer: Self-pay | Admitting: Internal Medicine

## 2012-01-12 ENCOUNTER — Ambulatory Visit (INDEPENDENT_AMBULATORY_CARE_PROVIDER_SITE_OTHER): Payer: Medicare Other | Admitting: Internal Medicine

## 2012-01-12 VITALS — BP 132/84 | HR 61 | Ht 72.0 in | Wt 201.0 lb

## 2012-01-12 DIAGNOSIS — R002 Palpitations: Secondary | ICD-10-CM | POA: Insufficient documentation

## 2012-01-12 DIAGNOSIS — I4892 Unspecified atrial flutter: Secondary | ICD-10-CM

## 2012-01-12 DIAGNOSIS — I4891 Unspecified atrial fibrillation: Secondary | ICD-10-CM

## 2012-01-12 NOTE — Patient Instructions (Signed)
Your physician wants you to follow-up in:  6 months. You will receive a reminder letter in the mail two months in advance. If you don't receive a letter, please call our office to schedule the follow-up appointment.   

## 2012-01-12 NOTE — Assessment & Plan Note (Signed)
Recurrent atrial flutter status post cardioversion

## 2012-01-12 NOTE — Progress Notes (Signed)
HPI  [name] is a 66 y.o. male Is seen today to establish arrhythmia followup.  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 Ronn Melena. He then developed an atrial flutter which was apparently somehow related to the prior ablation underwent catheter ablation and and required re\re ablation 2 years later 2007. He has had recurrent flutter on one occasion in 2010 prompting cardioversion and again last month  He also has multiple other arrhythmia things in the context of his bradycardia. He's had PVCs and other flutters.  He also describes  in the evening a sensation of fluttering. When he checks his monitor with the he sees a pause on associated with a change in QRS morphology that appears to be approximately 2 times the PTT in  His exercise tolerance is quite good not withstanding his resting bradycardia. And first degree AV block His peak heart rate is up to the 130s. His left ventricular function is normal he has no known coronary disease  Past Medical History  Diagnosis Date  . Hyperlipidemia   . Pulsatile tinnitus   . 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  . History of atrial flutter 2007    ABLATION  . Familial tremor   . Atrial tachycardia 2007    ANTEROSEPTAL ABLATION  . Hypertension     Past Surgical History  Procedure Date  . Prostatectomy 02/20/2009    COMPLICATED  . Hernia repair     left side  . Umbilical hernia repair   . Shoulder surgery     Bilateral  . Atrial ablation surgery   . Tonsillectomy and adenoidectomy   . Colonoscopy   . Cardioversion 12/03/2011    Procedure: CARDIOVERSION;  Surgeon: Duke Salvia, MD;  Location: [facility] OR;  Service: Cardiovascular;  Laterality: [name];    Current Outpatient Prescriptions  Medication Sig Dispense Refill  . aspirin 81 MG tablet Take 81 mg by mouth daily.       . Cholecalciferol (VITAMIN D  PO) Take 1,000 mg by mouth daily.       . Coenzyme Q10 (COQ10 PO) Take by mouth daily.        . Cyanocobalamin (B-12 PO) Take 1,000 mg by mouth daily.       . propranolol (INDERAL) 10 MG tablet Take 10 mg by mouth every morning. 1 tablet three times daily as directed      . triamterene-hydrochlorothiazide (MAXZIDE-25) 37.5-25 MG per tablet Take 0.5 tablets by mouth daily.       Current Facility-Administered Medications  Medication Dose Route Frequency Provider Last Rate Last Dose  . 0.9 %  sodium chloride infusion  500 mL Intravenous Continuous Louis Meckel, MD        Allergies  Allergen Reactions  . Ceclor (Cefaclor) Hives  . Other     Does not tolerate Statins well    Review of Systems negative except from HPI and PMH  Physical Exam BP 132/84  Pulse 61  Ht 6' (1.829 m)  Wt 201 lb (91.173 kg)  BMI 27.26 kg/m2 Well developed and well nourished in no acute distress HENT normal E scleral and icterus clear Neck Supple JVP flat; carotids brisk and full Clear to ausculation Regular rate and rhythm, quiet S1 Soft with active bowel sounds No clubbing cyanosis none Edema Alert and oriented, grossly normal motor and sensory function Skin Warm and Dry  Sinus rhtyhm with 1AVB PR interval is 350  Assessment and  Plan

## 2012-01-13 ENCOUNTER — Encounter (HOSPITAL_COMMUNITY): Payer: Self-pay | Admitting: Anesthesiology

## 2012-01-13 ENCOUNTER — Other Ambulatory Visit: Payer: Self-pay

## 2012-01-13 ENCOUNTER — Encounter (HOSPITAL_COMMUNITY)
Admission: AD | Disposition: A | Discharge status: Home / Self Care | Payer: Self-pay | Source: Ambulatory Visit | Attending: Cardiology

## 2012-01-13 ENCOUNTER — Encounter: Payer: Self-pay | Admitting: Internal Medicine

## 2012-01-13 ENCOUNTER — Telehealth: Payer: Self-pay | Admitting: Internal Medicine

## 2012-01-13 ENCOUNTER — Ambulatory Visit (HOSPITAL_COMMUNITY)
Admission: AD | Admit: 2012-01-13 | Discharge: 2012-01-13 | Disposition: A | Discharge status: Home / Self Care | Payer: Medicare Other | Source: Ambulatory Visit | Attending: Cardiology | Admitting: Cardiology

## 2012-01-13 ENCOUNTER — Ambulatory Visit (INDEPENDENT_AMBULATORY_CARE_PROVIDER_SITE_OTHER): Payer: Medicare Other | Admitting: Internal Medicine

## 2012-01-13 ENCOUNTER — Telehealth: Payer: Self-pay | Admitting: Physician Assistant

## 2012-01-13 ENCOUNTER — Ambulatory Visit (HOSPITAL_COMMUNITY): Payer: Medicare Other | Admitting: Anesthesiology

## 2012-01-13 VITALS — BP 126/88 | HR 66 | Ht 72.0 in | Wt 201.0 lb

## 2012-01-13 DIAGNOSIS — I4892 Unspecified atrial flutter: Secondary | ICD-10-CM

## 2012-01-13 DIAGNOSIS — E785 Hyperlipidemia, unspecified: Secondary | ICD-10-CM | POA: Insufficient documentation

## 2012-01-13 DIAGNOSIS — I10 Essential (primary) hypertension: Secondary | ICD-10-CM | POA: Insufficient documentation

## 2012-01-13 HISTORY — PX: CARDIOVERSION: SHX1299

## 2012-01-13 LAB — CBC
HCT: 43.3 % (ref 39.0–52.0)
Hemoglobin: 15.3 g/dL (ref 13.0–17.0)
MCV: 86.3 fL (ref 78.0–100.0)
RBC: 5.02 MIL/uL (ref 4.22–5.81)
RDW: 12.7 % (ref 11.5–15.5)
WBC: 7.1 10*3/uL (ref 4.0–10.5)

## 2012-01-13 LAB — BASIC METABOLIC PANEL
CO2: 23 mEq/L (ref 19–32)
Chloride: 101 mEq/L (ref 96–112)
Creatinine, Ser: 0.93 mg/dL (ref 0.50–1.35)
GFR calc Af Amer: >90 mL/min (ref >90)
Potassium: 4.4 mEq/L (ref 3.5–5.1)
Sodium: 135 mEq/L (ref 135–145)

## 2012-01-13 LAB — DIFFERENTIAL
Basophils Absolute: 0 10*3/uL (ref 0.0–0.1)
Lymphocytes Relative: 21 % (ref 12–46)
Lymphs Abs: 1.5 10*3/uL (ref 0.7–4.0)
Monocytes Absolute: 0.6 10*3/uL (ref 0.1–1.0)
Neutro Abs: 4.9 10*3/uL (ref 1.7–7.7)

## 2012-01-13 SURGERY — CARDIOVERSION
Anesthesia: Monitor Anesthesia Care | Wound class: Clean

## 2012-01-13 MED ORDER — SODIUM CHLORIDE 0.9 % IV SOLN
INTRAVENOUS | Status: DC | PRN
  Administered 2012-01-13: 14:00:00 via INTRAVENOUS

## 2012-01-13 MED ORDER — SODIUM CHLORIDE 0.9 % IV SOLN: 250.0000 mL | INTRAVENOUS | Status: DC

## 2012-01-13 MED ORDER — HYDROCORTISONE 1 % EX CREA
1.0000 "application " | TOPICAL_CREAM | Freq: Three times a day (TID) | CUTANEOUS | Status: DC | PRN
  Filled 2012-01-13 (×2): qty 28

## 2012-01-13 MED ORDER — SODIUM CHLORIDE 0.9 % IJ SOLN: 3.0000 mL | INTRAMUSCULAR | Status: DC | PRN

## 2012-01-13 MED ORDER — SODIUM CHLORIDE 0.9 % IJ SOLN: 3.0000 mL | Freq: Two times a day (BID) | INTRAMUSCULAR | Status: DC

## 2012-01-13 MED ORDER — SODIUM CHLORIDE 0.9 % IV SOLN: INTRAVENOUS | Status: DC

## 2012-01-13 MED ORDER — PROPOFOL 10 MG/ML IV BOLUS
INTRAVENOUS | Status: DC | PRN
  Administered 2012-01-13: 80 mg via INTRAVENOUS

## 2012-01-13 NOTE — Preoperative (Signed)
Beta Blockers   Reason not to administer Beta Blockers: not prescribed 

## 2012-01-13 NOTE — Anesthesia Postprocedure Evaluation (Signed)
  Anesthesia Post-op Note  Patient: [name]  Procedure(s) Performed: Procedure(s) (LRB): CARDIOVERSION ([name])  Patient Location: Short Stay  Anesthesia Type: General  Level of Consciousness: awake, alert  and oriented  Airway and Oxygen Therapy: Patient Spontanous Breathing  Post-op Pain: none  Post-op Assessment: Post-op Vital signs reviewed, Patient's Cardiovascular Status Stable, Respiratory Function Stable, Patent Airway, No signs of Nausea or vomiting and Pain level controlled  Post-op Vital Signs: Reviewed and stable  Complications: No apparent anesthesia complications

## 2012-01-13 NOTE — Discharge Instructions (Signed)
General Anesthetic, Adult A doctor specialized in giving anesthesia (anesthesiologist) or a nurse specialized in giving anesthesia (nurse anesthetist) gives medicine that makes you sleep while a procedure is performed (general anesthetic). Once the general anesthetic has been administered, you will be in a sleeplike state in which you feel no pain. After having a general anestheticyou may feel:   Dizzy.   Weak.   Drowsy.   Confused.  These feelings are normal and can be expected to last for up to 24 hours after the procedure is completed.  LET YOUR CAREGIVER KNOW ABOUT:  Allergies you have.   Medications you are taking, including herbs, eye drops, over the counter medications, dietary supplements, and creams.   Previous problems you have had with anesthetics or numbing medicines.   Use of cigarettes, alcohol, or illicit drugs.   Possibility of pregnancy, if this applies.   History of bleeding or blood disorders, including blood clots and clotting disorders.   Previous surgeries you have had and types of anesthetics you have received.   Family medical history, especially anesthetic problems.   Other health problems.  BEFORE THE PROCEDURE  You may brush your teeth on the morning of surgery but you should have no solid food or non-clear liquids for a minimum of 8 hours prior to your procedure. Clear liquids (water, apple juice, black coffee, and tea) are acceptable in small amounts until 2 hours prior to your procedure.   You may take your regular medications the morning of your procedure unless your caregiver indicates otherwise.  AFTER THE PROCEDURE  After surgery, you will be taken to the recovery area where a nurse will monitor your progress. You will be allowed to go home when you are awake, stable, taking fluids well, and without serious pain or complications.   For the first 24 hours following an anesthetic:   Have a responsible person with you.   Do not drive a car.  If you are alone, do not take public transportation.   Do not engage in strenuous activity. You may usually resume normal activities the next day, or as advised by your caregiver.   Do not drink alcohol.   Do not take medicine that has not been prescribed by your caregiver.   Do not sign important papers or make important decisions as your judgement may be impaired.   You may resume a normal diet as directed.   Change bandages (dressings) as directed.   Only take over-the-counter or prescription medicines for pain, discomfort, or fever as directed by your caregiver.  If you have questions or problems that seem related to the anesthetic, call the hospital and ask for the anesthetist, anesthesiologist, or anesthesia department. SEEK IMMEDIATE MEDICAL CARE IF:   You develop a rash.   You have difficulty breathing.   You have chest pain.   You have allergic problems.   You have uncontrolled nausea.   You have uncontrolled vomiting.   You develop any serious bleeding, especially from the incision site.  Document Released: 02/09/2008 Document Revised: 07/15/2011 Document Reviewed: 03/05/2011 [facility] Patient Information 2012 [place], [place].

## 2012-01-13 NOTE — Telephone Encounter (Signed)
Pt wants Dr. Graciela Husbands to know that he would like to go into the hospital on Friday or Saturday of this week to start Tikosyn.  He would like a call back as to what day and other details.

## 2012-01-13 NOTE — Transfer of Care (Signed)
Immediate Anesthesia Transfer of Care Note  Patient: [name]  Procedure(s) Performed: Procedure(s) (LRB): CARDIOVERSION ([name])  Patient Location: Short Stay  Anesthesia Type: General  Level of Consciousness: awake, alert  and oriented  Airway & Oxygen Therapy: Patient Spontanous Breathing  Post-op Assessment: Report given to PACU RN, Post -op Vital signs reviewed and stable and Patient moving all extremities  Post vital signs: Reviewed and stable  Complications: No apparent anesthesia complications

## 2012-01-13 NOTE — Procedures (Signed)
EP Procedure Note  DCCV carried out without immediate complication. [id number].

## 2012-01-13 NOTE — Anesthesia Postprocedure Evaluation (Signed)
  Anesthesia Post-op Note  Patient: [name]  Procedure(s) Performed: Procedure(s) (LRB): CARDIOVERSION ([name])  Patient Location: PACU and Short Stay  Anesthesia Type: General  Level of Consciousness: awake and alert   Airway and Oxygen Therapy: Patient Spontanous Breathing  Post-op Pain: none  Post-op Assessment: Post-op Vital signs reviewed, Patient's Cardiovascular Status Stable, Respiratory Function Stable, Patent Airway, No signs of Nausea or vomiting and Pain level controlled  Post-op Vital Signs: stable  Complications: No apparent anesthesia complications

## 2012-01-13 NOTE — Telephone Encounter (Signed)
New Msg: pt calling to speak to [facility] regarding pt upcoming procedure.  Pt is going in for cardioversion now and stated please return pt call later in the afternoon.

## 2012-01-13 NOTE — Interval H&P Note (Signed)
History and Physical Interval Note:  01/13/2012 2:30 PM  [name]  has presented today for surgery, with the diagnosis of a fib  The various methods of treatment have been discussed with the patient and family. After consideration of risks, benefits and other options for treatment, the patient has consented to  Procedure(s) (LRB): CARDIOVERSION ([name]) as a surgical intervention .  The patients' history has been reviewed, patient examined, no change in status, stable for surgery.  I have reviewed the patients' chart and labs.  Questions were answered to the patient's satisfaction.     Lewayne Bunting

## 2012-01-13 NOTE — Telephone Encounter (Signed)
F/U  Patient [phone] is at the hospital waiting to have a cardioversion at [facility] hospital.  Patient need med info to get started on hospital medication.  Patient asks that the called returned by 1:30pm 01/13/12.

## 2012-01-13 NOTE — Anesthesia Preprocedure Evaluation (Addendum)
Anesthesia Evaluation  Patient identified by MRN, date of birth, ID band Patient awake    Reviewed: Allergy & Precautions, H&P , NPO status , Patient's Chart, lab work & pertinent test results, reviewed documented beta blocker date and time   Airway Mallampati: II TM Distance: >3 FB Neck ROM: Full    Dental  (+) Teeth Intact and Dental Advisory Given   Pulmonary          Cardiovascular hypertension, Pt. on medications + dysrhythmias Atrial Fibrillation     Neuro/Psych    GI/Hepatic   Endo/Other    Renal/GU      Musculoskeletal   Abdominal   Peds  Hematology   Anesthesia Other Findings   Reproductive/Obstetrics                           Anesthesia Physical Anesthesia Plan  ASA: II  Anesthesia Plan: General   Post-op Pain Management:    Induction: Intravenous  Airway Management Planned: Mask  Additional Equipment:   Intra-op Plan:   Post-operative Plan:   Informed Consent: I have reviewed the patients History and Physical, chart, labs and discussed the procedure including the risks, benefits and alternatives for the proposed anesthesia with the patient or authorized representative who has indicated his/her understanding and acceptance.   Dental advisory given  Plan Discussed with: CRNA and Anesthesiologist  Anesthesia Plan Comments:         Anesthesia Quick Evaluation

## 2012-01-13 NOTE — H&P (View-Only) (Signed)
HPI  [name] is a 66 y.o. male Is seen today to establish arrhythmia followup.  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 Marcus Wharton. He then developed an atrial flutter which was apparently somehow related to the prior ablation underwent catheter ablation and and required re\re ablation 2 years later 2007. He has had recurrent flutter on one occasion in 2010 prompting cardioversion and again last month.  That occurred the day after a office visit. He comes in today having been seen yesterday again in an atypical flutter     He also described in the evening a sensation of fluttering. When he checks his monitor with the he sees a pause on associated with a change in QRS morphology that appears to be approximately 2 times the PTT in  His exercise tolerance is quite good not withstanding his resting bradycardia. And first degree AV block His peak heart rate is up to the 130s. His left ventricular function is normal he has no known coronary disease  Past Medical History  Diagnosis Date  . Hyperlipidemia   . Pulsatile tinnitus   . 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  . History of atrial flutter 2007    ABLATION  . Familial tremor   . Atrial tachycardia 2007    ANTEROSEPTAL ABLATION  . Hypertension     Past Surgical History  Procedure Date  . Prostatectomy 02/20/2009    COMPLICATED  . Hernia repair     left side  . Umbilical hernia repair   . Shoulder surgery     Bilateral  . Atrial ablation surgery   . Tonsillectomy and adenoidectomy   . Colonoscopy   . Cardioversion 12/03/2011    Procedure: CARDIOVERSION;  Surgeon: Tarika Mckethan C Daymon Hora, MD;  Location: [facility] OR;  Service: Cardiovascular;  Laterality: [name];    Current Outpatient Prescriptions  Medication Sig Dispense Refill  . aspirin 81 MG tablet Take 81 mg by mouth daily.       . Cholecalciferol  (VITAMIN D PO) Take 1,000 mg by mouth daily.       . Coenzyme Q10 (COQ10 PO) Take by mouth daily.        . Cyanocobalamin (B-12 PO) Take 1,000 mg by mouth daily.       . propranolol (INDERAL) 10 MG tablet Take 10 mg by mouth every morning. 1 tablet three times daily as directed      . triamterene-hydrochlorothiazide (MAXZIDE-25) 37.5-25 MG per tablet Take 0.5 tablets by mouth daily.       Current Facility-Administered Medications  Medication Dose Route Frequency Provider Last Rate Last Dose  . 0.9 %  sodium chloride infusion  500 mL Intravenous Continuous Robert D Kaplan, MD        Allergies  Allergen Reactions  . Ceclor (Cefaclor) Hives  . Other     Does not tolerate Statins well    Review of Systems negative except from HPI and PMH  Physical Exam BP 126/88  Pulse 66  Ht 6' (1.829 m)  Wt 201 lb (91.173 kg)  BMI 27.26 kg/m2 Well developed and well nourished in no acute distress HENT normal E scleral and icterus clear Neck Supple JVP flat with futter waves Clear to ausculation Regular rate and rhythm, quiet S1 Soft with active bowel sounds No clubbing cyanosis none Edema Alert and oriented, grossly normal motor and sensory function Skin   Warm and Dry  Atrial flutter with a flutter cycle length of 240 ms with 4:1 conduction  Assessment and  Plan  

## 2012-01-13 NOTE — Assessment & Plan Note (Signed)
The patient has recurrent post ablation atypical flutter. He has had 3 prior ablation procedures. His quite symptomatic. He notes that this started last night. We will plan to undertake cardioversion today. We've also discussed antiarrhythmic therapy as an alternative to repeated cardioversions. Given the increasing frequency of the events, we will plan next week and to admit him for dofetilide. He is not a candidate for 1C therapy or amiodarone or sotalol because of the conduction slowing effects that we would anticipate. I am a little bit concerned about these being aggravated also by dofetilide and we'll have to keep an eye on this at discharge.

## 2012-01-13 NOTE — Patient Instructions (Signed)
Do not eat or drink anything this morning.  Go to the short stay center at [facility]  Cardioversion scheduled for today at [facility].

## 2012-01-13 NOTE — Telephone Encounter (Signed)
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 He feels generally very bad but no presyncope/syncope and no chest pain. Offered to get the ofc to call him and get him in to be seen or let the ER know he was coming but he states he will come to the ofc at 8 am and wait to be worked in.

## 2012-01-13 NOTE — Progress Notes (Signed)
HPI  [name] is a 66 y.o. male Is seen today to establish arrhythmia followup.  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 Ronn Melena. He then developed an atrial flutter which was apparently somehow related to the prior ablation underwent catheter ablation and and required re\re ablation 2 years later 2007. He has had recurrent flutter on one occasion in 2010 prompting cardioversion and again last month.  That occurred the day after a office visit. He comes in today having been seen yesterday again in an atypical flutter     He also described in the evening a sensation of fluttering. When he checks his monitor with the he sees a pause on associated with a change in QRS morphology that appears to be approximately 2 times the PTT in  His exercise tolerance is quite good not withstanding his resting bradycardia. And first degree AV block His peak heart rate is up to the 130s. His left ventricular function is normal he has no known coronary disease  Past Medical History  Diagnosis Date  . Hyperlipidemia   . Pulsatile tinnitus   . 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  . History of atrial flutter 2007    ABLATION  . Familial tremor   . Atrial tachycardia 2007    ANTEROSEPTAL ABLATION  . Hypertension     Past Surgical History  Procedure Date  . Prostatectomy 02/20/2009    COMPLICATED  . Hernia repair     left side  . Umbilical hernia repair   . Shoulder surgery     Bilateral  . Atrial ablation surgery   . Tonsillectomy and adenoidectomy   . Colonoscopy   . Cardioversion 12/03/2011    Procedure: CARDIOVERSION;  Surgeon: Duke Salvia, MD;  Location: [facility] OR;  Service: Cardiovascular;  Laterality: [name];    Current Outpatient Prescriptions  Medication Sig Dispense Refill  . aspirin 81 MG tablet Take 81 mg by mouth daily.       . Cholecalciferol  (VITAMIN D PO) Take 1,000 mg by mouth daily.       . Coenzyme Q10 (COQ10 PO) Take by mouth daily.        . Cyanocobalamin (B-12 PO) Take 1,000 mg by mouth daily.       . propranolol (INDERAL) 10 MG tablet Take 10 mg by mouth every morning. 1 tablet three times daily as directed      . triamterene-hydrochlorothiazide (MAXZIDE-25) 37.5-25 MG per tablet Take 0.5 tablets by mouth daily.       Current Facility-Administered Medications  Medication Dose Route Frequency Provider Last Rate Last Dose  . 0.9 %  sodium chloride infusion  500 mL Intravenous Continuous Louis Meckel, MD        Allergies  Allergen Reactions  . Ceclor (Cefaclor) Hives  . Other     Does not tolerate Statins well    Review of Systems negative except from HPI and PMH  Physical Exam BP 126/88  Pulse 66  Ht 6' (1.829 m)  Wt 201 lb (91.173 kg)  BMI 27.26 kg/m2 Well developed and well nourished in no acute distress HENT normal E scleral and icterus clear Neck Supple JVP flat with futter waves Clear to ausculation Regular rate and rhythm, quiet S1 Soft with active bowel sounds No clubbing cyanosis none Edema Alert and oriented, grossly normal motor and sensory function Skin  Warm and Dry  Atrial flutter with a flutter cycle length of 240 ms with 4:1 conduction  Assessment and  Plan

## 2012-01-13 NOTE — Addendum Note (Signed)
Addendum  created 01/13/12 1428 by Elizbeth Squires, CRNA   Modules edited:Anesthesia Events, Anesthesia Flowsheet

## 2012-01-14 ENCOUNTER — Encounter (HOSPITAL_COMMUNITY): Payer: Self-pay | Admitting: Cardiology

## 2012-01-14 NOTE — Telephone Encounter (Signed)
Spoke with pt.  I have answered several of his questions regarding Tikosyn including which abx he will be able to take and the use of local anesthetics during an upcoming eye procedure.  He is going to come to the clinic on 3/8 at 8:30 to do pre-hospitalization work-up with Tikosyn clinic.

## 2012-01-14 NOTE — Telephone Encounter (Signed)
I spoke with Dr. Graciela Husbands to find out what is going on with the patient. He states he had spoken with him about Tikosyn and explained he would be on next weekend, but if he wants to go this weekend, would be ok. Sherri Rad, RN, BSN  I spoke with the patient. He states he will wait until next weekend when Dr. Graciela Husbands in around to initiate Tikosyn. He will go in on Friday 3/8. He has several questions regarding Tikosyn. I explained that Dr. Graciela Husbands is not in the office today, but I will forward him a message and ask that he call the patient. I explained that Weston Brass, pharm D also handles our Tikosyn admissions and she may can answer his questions. He would like a call from her as well. I will ask that she call him regarding Tikosyn, and also let her know that he would like to go in on 3/8 for Tikosyn initiation. He had a bmp yesterday at the hospital, but I do not see a magnesium level. Sherri Rad, RN, BSN

## 2012-01-14 NOTE — Telephone Encounter (Signed)
Fu call Pt had questions Please call him back

## 2012-01-14 NOTE — Op Note (Signed)
[name], BRASHIER NO.:  [id number]  MEDICAL RECORD NO.:  [id number]  LOCATION:  [facility]                         FACILITY:  [facility]  PHYSICIAN:  Doylene Canning. [name], MD    DATE OF BIRTH:  November 22, 1945  DATE OF PROCEDURE:  01/13/2012 DATE OF DISCHARGE:  01/13/2012                              OPERATIVE REPORT   SURGEON:  Doylene Canning. [name], MD  PROCEDURE PERFORMED:  DC cardioversion.  INDICATION:  Symptomatic atrial flutter.  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  He underwent cardioversion approximately 6-8 weeks ago and has developed recurrent atrial flutter.  He is pending initiation of Tikosyn therapy in several days.  He is brought in now to undergo DC cardioversion because of symptomatic atrial flutter.  PROCEDURE:  After informed consent was obtained, the patient was prepped in an usual manner.  The electrodispersive pad was placed in the anterior-posterior position.  He was sedated under the direction of Dr. Gypsy Balsam with propofol.  A 200 joules of synchronized biphasic energy was subsequently delivered through the electrodispersive pad, placed in the anterior-posterior position resulting in termination of atrial flutter and restoration of sinus rhythm.  The patient tolerated this quite nicely and was recovered and discharged home in satisfactory condition.  COMPLICATIONS:  There were no immediate procedure complications.  RESULTS:  This demonstrate successful DC cardioversion in a patient with atrial flutter.     Doylene Canning. [name], MD     GWT/MEDQ  D:  01/13/2012  T:  01/14/2012  Job:  [id number]  cc:   Duke Salvia, MD, [facility]

## 2012-01-20 ENCOUNTER — Inpatient Hospital Stay (HOSPITAL_COMMUNITY)
Admission: AD | Admit: 2012-01-20 | Discharge: 2012-01-23 | DRG: 310 | Disposition: A | Discharge status: Home / Self Care | Payer: Medicare Other | Source: Ambulatory Visit | Attending: Internal Medicine | Admitting: Internal Medicine

## 2012-01-20 ENCOUNTER — Other Ambulatory Visit: Payer: Self-pay

## 2012-01-20 ENCOUNTER — Encounter (HOSPITAL_COMMUNITY): Payer: Self-pay | Admitting: Internal Medicine

## 2012-01-20 ENCOUNTER — Ambulatory Visit (INDEPENDENT_AMBULATORY_CARE_PROVIDER_SITE_OTHER): Payer: Medicare Other | Admitting: Pharmacist

## 2012-01-20 VITALS — HR 52 | Wt 201.0 lb

## 2012-01-20 DIAGNOSIS — E785 Hyperlipidemia, unspecified: Secondary | ICD-10-CM | POA: Diagnosis present

## 2012-01-20 DIAGNOSIS — I4891 Unspecified atrial fibrillation: Secondary | ICD-10-CM

## 2012-01-20 DIAGNOSIS — G250 Essential tremor: Secondary | ICD-10-CM | POA: Diagnosis present

## 2012-01-20 DIAGNOSIS — R002 Palpitations: Secondary | ICD-10-CM | POA: Insufficient documentation

## 2012-01-20 DIAGNOSIS — I442 Atrioventricular block, complete: Secondary | ICD-10-CM | POA: Insufficient documentation

## 2012-01-20 DIAGNOSIS — Z888 Allergy status to other drugs, medicaments and biological substances status: Secondary | ICD-10-CM

## 2012-01-20 DIAGNOSIS — I471 Supraventricular tachycardia: Secondary | ICD-10-CM

## 2012-01-20 DIAGNOSIS — Z79899 Other long term (current) drug therapy: Secondary | ICD-10-CM

## 2012-01-20 DIAGNOSIS — I4892 Unspecified atrial flutter: Principal | ICD-10-CM | POA: Insufficient documentation

## 2012-01-20 DIAGNOSIS — I10 Essential (primary) hypertension: Secondary | ICD-10-CM | POA: Insufficient documentation

## 2012-01-20 DIAGNOSIS — I440 Atrioventricular block, first degree: Secondary | ICD-10-CM | POA: Diagnosis present

## 2012-01-20 HISTORY — DX: Atrioventricular block, first degree: I44.0

## 2012-01-20 HISTORY — DX: Benign paroxysmal vertigo, unspecified ear: H81.10

## 2012-01-20 HISTORY — DX: Unspecified atrial flutter: I48.92

## 2012-01-20 LAB — BASIC METABOLIC PANEL
BUN: 11 mg/dL (ref 6–23)
CO2: 27 mEq/L (ref 19–32)
Calcium: 9.4 mg/dL (ref 8.4–10.5)
Chloride: 104 mEq/L (ref 96–112)
Creat: 1.09 mg/dL (ref 0.50–1.35)
GFR calc Af Amer: 82 mL/min — ABNORMAL LOW (ref >90)
GFR calc non Af Amer: 71 mL/min — ABNORMAL LOW (ref >90)
Glucose, Bld: 87 mg/dL (ref 70–99)
Potassium: 4.4 mEq/L (ref 3.5–5.1)
Sodium: 137 mEq/L (ref 135–145)

## 2012-01-20 MED ORDER — ZOLPIDEM TARTRATE 5 MG PO TABS: 5.0000 mg | ORAL_TABLET | Freq: Every evening | ORAL | Status: DC | PRN

## 2012-01-20 MED ORDER — ACETAMINOPHEN 325 MG PO TABS: 650.0000 mg | ORAL_TABLET | Freq: Four times a day (QID) | ORAL | Status: DC | PRN

## 2012-01-20 MED ORDER — SODIUM CHLORIDE 0.9 % IV SOLN: 250.0000 mL | INTRAVENOUS | Status: DC | PRN

## 2012-01-20 MED ORDER — MECLIZINE HCL 25 MG PO TABS
25.0000 mg | ORAL_TABLET | Freq: Three times a day (TID) | ORAL | Status: DC | PRN
  Filled 2012-01-20: qty 1

## 2012-01-20 MED ORDER — DOFETILIDE 500 MCG PO CAPS
500.0000 ug | ORAL_CAPSULE | Freq: Two times a day (BID) | ORAL | Status: DC
  Administered 2012-01-20: 500 ug via ORAL
  Filled 2012-01-20 (×4): qty 1

## 2012-01-20 MED ORDER — SODIUM CHLORIDE 0.9 % IJ SOLN: 3.0000 mL | Freq: Two times a day (BID) | INTRAMUSCULAR | Status: DC

## 2012-01-20 MED ORDER — SODIUM CHLORIDE 0.9 % IJ SOLN: 3.0000 mL | INTRAMUSCULAR | Status: DC | PRN

## 2012-01-20 MED ORDER — ACETAMINOPHEN 650 MG RE SUPP: 650.0000 mg | Freq: Four times a day (QID) | RECTAL | Status: DC | PRN

## 2012-01-20 MED ORDER — SODIUM CHLORIDE 0.9 % IJ SOLN
3.0000 mL | Freq: Two times a day (BID) | INTRAMUSCULAR | Status: DC
  Administered 2012-01-20 – 2012-01-22 (×5): 3 mL via INTRAVENOUS

## 2012-01-20 NOTE — H&P (Signed)
History and Physical  Patient ID: [name] MRN: [id number], SOB: 06-Sep-1946 66 y.o. Date of Encounter: 01/20/2012, 5:04 PM  Primary Physician: Darnelle Bos, MD, MD Primary Cardiologist: sk Primary Electrophysiologist:  sk  Chief Complaint: [name] loading  History of Present Illness: [name] is a 66 y.o. male  admitted for the initiation of cephalad.  He has a history of ectopic atrial tachycardia and is status post catheter ablation initially in the late 90s by Dr. Gwenevere Abbot at [facility]. He then developed atrial flutter and underwent catheter ablation in 2005 and in 2007. He had repeat arrhythmia and 2010 and in t he last 2 months has had 2 episodes of recurrent atrial flutter. He is now admitted for Tikosyn.  He also has symptomatic pauses which have been identified on a That he uses on his forearm. The mechanism is not clear (oral medicines until now).  His exercise tolerance has been quite good not withstanding his resting bradycardia and first degree AV block. A heart rate of 130 has been noted. Left ventricular function is normal in the past; it has not been reevaluated recently  Past Medical History  Diagnosis Date  . Hyperlipidemia   . Pulsatile tinnitus   . 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  . History of atrial flutter 2007    ABLATION  . Familial tremor   . Atrial tachycardia 2007    ANTEROSEPTAL ABLATION  . Hypertension      Past Surgical History  Procedure Date  . Prostatectomy 02/20/2009    COMPLICATED  . Hernia repair     left side  . Umbilical hernia repair   . Shoulder surgery     Bilateral  . Atrial ablation surgery   . Tonsillectomy and adenoidectomy   . Colonoscopy   . Cardioversion 12/03/2011    Procedure: CARDIOVERSION;  Surgeon: Duke Salvia, MD;  Location: [facility] OR;  Service: Cardiovascular;  Laterality: [name];  . Cardioversion 01/13/2012    Procedure: CARDIOVERSION;  Surgeon: Lewayne Bunting, MD;   Location: [facility] OR;  Service: Cardiovascular;  Laterality: [name];      No current facility-administered medications for this encounter.     Allergies: Allergies  Allergen Reactions  . Ace Inhibitors     Does not take due to incontinence   . Ceclor (Cefaclor) Hives  . Other     Does not tolerate Statins well  . Statins     Muscle weakness     History  Substance Use Topics  . Smoking status: Never Smoker   . Smokeless tobacco: Never Used  . Alcohol Use: 1.5 oz/week    3 drink(s) per week      Family History  Problem Relation Age of Onset  . Colon cancer Sister       ROS:  Please see the history of present illness.     All other systems reviewed and negative.   Vital Signs: Blood pressure 139/89, pulse 53, temperature 98.4 F (36.9 C), temperature source Oral, resp. rate 18, height 6' (1.829 m), weight 195 lb 1.7 oz (88.5 kg), SpO2 98.00%.  PHYSICAL EXAM: General:  Well nourished, well developed male in no acute distress  HEENT: normal Lymph: no adenopathy Neck: no JVD Endocrine:  No thryomegaly Vascular: No carotid bruits;   Cardiac:  Diminished S1 normal S2; RRR; no murmur Back: without kyphosis/scoliosis,   Lungs:  clear to auscultation bilaterally, no wheezing, rhonchi or rales Abd: soft, nontender,   Ext: no  edema Musculoskeletal:  No deformities, BUE and BLE strength normal and equal Skin: warm and dry Neuro:  CNs 2-12 intact, no focal abnormalities noted Psych:  Normal affect   EKG:  Pending but QT presumable checked in the office today and within normal limits for tikosyn  Repeat pending  Labs:   Lab Results  Component Value Date   WBC 7.1 01/13/2012   HGB 15.3 01/13/2012   HCT 43.3 01/13/2012   MCV 86.3 01/13/2012   PLT 213 01/13/2012    Lab 01/20/12 0940  NA 139  K 4.3  CL 104  CO2 27  BUN 11  CREATININE 1.09  CALCIUM 9.6  PROT --  BILITOT --  ALKPHOS --  ALT --  AST --  GLUCOSE 87   Mg 2.3   ASSESSMENT AND PLAN  Patient Active  Hospital Problem List: Atrial flutter ()   Assessment: RECURREENT     Plan: begin tikosyn  Alternative agents are not available as bradycardia precludes some and 1AVB precludes the others   Palpitations (01/12/2012)   Assessment: these turn out to be related to blocked PACs   Plan: observe on tikosyn First degree AV block ()   Assessment: this may worsen, although it shouldn't with tikosyn, a drug affecting K currents and primarily repolarization   Plan: this conduction issue will almost certainly buy pacing at some and hopefully more distant point.  This would solve the problem(probably) of symptoms related to his pauses although he may be one of those people who unfortunately is aware of ventricular pacing. This is another reason not to rush in here  Hypertension ()   Assessment: Stay    Plan: *follow

## 2012-01-20 NOTE — Assessment & Plan Note (Signed)
Reviewed pt's labwork.  K- 4.3 (goal>4.0), Magnesium- 2.3 (goal>1.8).  Pt is not currently on anticoagulation so INR monitoring not necessary.  QTc acceptable at 396 msec.  SCr- 1.09.  CrCl- 84.6 mL/min.  Will start pt on BID and adjust dose based on QTc. ```

## 2012-01-20 NOTE — Progress Notes (Signed)
HPI [name] is a 66 y.o. male who is at the clinic today for Tikosyn work-up prior to admission.  He was diagnosed with atrial fibrillation in the mid 1990s and has undergone several ablations and cardioversions.  His most recent cardioversion was 2/27. At that time, Dr. Graciela Husbands discussed the addition of an antiarrhythmic medication to help prevent frequent need for cardioversions.  Dr. Graciela Husbands preferred dofetilide over class 1c agents or amiodarone and sotalol due to their conduction slowing effects.   Reviewed pt's medication list.  He stopped his triamterene/HCTZ last week after his cardioversion.   He is not currently taking any QTc prolongating medications. Discussed compliance and potential side effects with pt.    EKG today showed sinus bradycardia with 1st degree AV block.  HR- 52.  QTc- 396.   Will check K, Mg and SCr today at lab.   Current Outpatient Prescriptions  Medication Sig Dispense Refill  . aspirin 81 MG tablet Take 81 mg by mouth daily.       . Cholecalciferol (VITAMIN D PO) Take 1,000 mg by mouth daily.       . Coenzyme Q10 (COQ10 PO) Take by mouth daily.        . Cyanocobalamin (B-12 PO) Take 1,000 mg by mouth daily.       . propranolol (INDERAL) 10 MG tablet Take 10 mg by mouth daily. For tremor      . propranolol (INDERAL) 10 MG tablet Take 5 mg by mouth as needed. For PVCs       Current Facility-Administered Medications  Medication Dose Route Frequency Provider Last Rate Last Dose  . DISCONTD: 0.9 %  sodium chloride infusion  500 mL Intravenous Continuous Louis Meckel, MD       Allergies  Allergen Reactions  . Ace Inhibitors     Does not take due to incontinence   . Ceclor (Cefaclor) Hives  . Other     Does not tolerate Statins well  . Statins     Muscle weakness

## 2012-01-21 ENCOUNTER — Encounter (HOSPITAL_COMMUNITY): Payer: Self-pay | Admitting: Internal Medicine

## 2012-01-21 ENCOUNTER — Other Ambulatory Visit: Payer: Self-pay

## 2012-01-21 LAB — BASIC METABOLIC PANEL
Calcium: 8.4 mg/dL (ref 8.4–10.5)
Creatinine, Ser: 1.17 mg/dL (ref 0.50–1.35)
GFR calc non Af Amer: 63 mL/min — ABNORMAL LOW (ref >90)
Glucose, Bld: 98 mg/dL (ref 70–99)
Sodium: 138 mEq/L (ref 135–145)

## 2012-01-21 LAB — MAGNESIUM: Magnesium: 2.2 mg/dL (ref 1.5–2.5)

## 2012-01-21 MED ORDER — DOFETILIDE 250 MCG PO CAPS
375.0000 ug | ORAL_CAPSULE | Freq: Two times a day (BID) | ORAL | Status: DC
  Administered 2012-01-21 (×2): 375 ug via ORAL
  Filled 2012-01-21 (×4): qty 1

## 2012-01-21 MED ORDER — POTASSIUM CHLORIDE CRYS ER 20 MEQ PO TBCR
40.0000 meq | EXTENDED_RELEASE_TABLET | Freq: Once | ORAL | Status: AC
  Administered 2012-01-21: 40 meq via ORAL
  Filled 2012-01-21: qty 2

## 2012-01-21 NOTE — Progress Notes (Signed)
  Patient Name: [name]      SUBJECTIVE:dizziness following dose of Tikosyn  With vertigo  Has history of BPV  Past Medical History  Diagnosis Date  . Hyperlipidemia   . Pulsatile tinnitus   . 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  . Atrial flutter -atypical 2007    ABLATION x2 [facility]  . Familial tremor   . Atrial tachycardia 2007    ANTEROSEPTAL ABLATION [facility]  . Hypertension   . First degree AV block   . Sinus pause assoc with blocked pwave   . Benign paroxysmal vertigo     PHYSICAL EXAM Filed Vitals:   01/20/12 1531 01/20/12 1933 01/20/12 2100 01/21/12 0520  BP: 139/89 146/82 144/78 127/80  Pulse: 53  56 57  Temp: 98.4 F (36.9 C)  97.3 F (36.3 C) 98.2 F (36.8 C)  TempSrc: Oral  Oral Oral  Resp: 18  18 16   Height:      Weight: 195 lb 1.7 oz (88.5 kg)     SpO2: 98%  99% 97%    Well developed and nourished in no acute distress HENT normal Neck supple with JVP-flat Clear Regular rate and rhythm, no murmurs or gallops Abd-soft with active BS No Clubbing cyanosis edema Skin-warm and dry A & Oriented  Grossly normal sensory and motor function   TELEMETRY: Reviewed telemetry pt in nsr with blcoked pacs:    Intake/Output Summary (Last 24 hours) at 01/21/12 0841 Last data filed at 01/20/12 2100  Gross per 24 hour  Intake    600 ml  Output      0 ml  Net    600 ml    LABS: Basic Metabolic Panel:  Lab 01/21/12 1610 01/20/12 1833 01/20/12 0940  NA 138 137 139  K 3.9 4.4 4.3  CL 104 102 104  CO2 28 28 27   GLUCOSE 98 94 87  BUN 11 11 11   CREATININE 1.17 1.06 1.09  CALCIUM 8.4 9.4 --  MG 2.2 2.2 --  PHOS -- -- --     ASSESSMENT AND PLAN:  Patient Active Hospital Problem List: Atrial flutter ()  Palpitations (01/12/2012)  First degree AV block ()  Hypertension ()  Dizziness following tikosyn  Dizziness may be 2/2 to [place]  Will try a lower dose and see what happens Pt agrreable    Signed, Sherryl Manges MD  01/21/2012

## 2012-01-21 NOTE — Progress Notes (Signed)
Patient QTC 460 after dose of tikosyn this am.  Notified PA of increase in QTC. Also notified of potassium 3.9 and orders for supplement given. Will continue to monitor. Nickolas Madrid

## 2012-01-21 NOTE — Progress Notes (Signed)
Pt felt dizzy for approximately 45 minutes-1hr along with short spells of vertigo between 7:30-8:30.  Pt stated that he is sensitive to medications and feels this is a reaction to Tikosyn.  He would feel more comfortable with a decrease in the dose.  Notified Ward Givens who stated that it is ok to hold 0600 dose of Tikosyn and re-assess a dose change this morning.  Will continue to monitor.  Arva Chafe

## 2012-01-21 NOTE — Progress Notes (Signed)
UR Completed. [name], Walter Blau F [phone]  

## 2012-01-21 NOTE — Progress Notes (Signed)
   CARE MANAGEMENT NOTE 01/21/2012  Patient:  [name]   Account Number:  [id number]  Date Initiated:  01/21/2012  Documentation initiated by:  GRAVES-BIGELOW,Rowe Warman  Subjective/Objective Assessment:   Pt admitted for Tikosyn initiation. Pt uses [facility] [address] will call to make sure they can order or have medication available. [facility] is not enrolled in [place] program.     Action/Plan:   Anticipated DC Date:  01/24/2012   Anticipated DC Plan:  HOME/SELF CARE      DC Planning Services  CM consult      Choice offered to / List presented to:             Status of service:  Completed, signed off Medicare Important Message given?   (If response is "NO", the following Medicare IM given date fields will be blank) Date Medicare IM given:   Date Additional Medicare IM given:    Discharge Disposition:  HOME/SELF CARE  Per UR Regulation:    Comments:  01-21-12 [address] Tomi Bamberger, RN,BSN [phone] [facility] [address] and [facility] are enrolled in the program. CM will make pt aware of location and benefits check when completed. Pt will receive a 7 day supply when he leaves via the main pharmacy tikosyn fund. MD please write rx for tikosyn  no refills and then the original  rx with refills. RN please call care manager for assistance with 7 day supply of tikosyn when pt is ready to d/c. Thanks

## 2012-01-22 ENCOUNTER — Ambulatory Visit: Payer: Medicare Other

## 2012-01-22 ENCOUNTER — Other Ambulatory Visit: Payer: Self-pay

## 2012-01-22 DIAGNOSIS — I4892 Unspecified atrial flutter: Principal | ICD-10-CM

## 2012-01-22 DIAGNOSIS — I498 Other specified cardiac arrhythmias: Secondary | ICD-10-CM

## 2012-01-22 DIAGNOSIS — I440 Atrioventricular block, first degree: Secondary | ICD-10-CM

## 2012-01-22 LAB — BASIC METABOLIC PANEL
Calcium: 8.6 mg/dL (ref 8.4–10.5)
GFR calc Af Amer: 75 mL/min — ABNORMAL LOW (ref >90)
GFR calc non Af Amer: 65 mL/min — ABNORMAL LOW (ref >90)
Glucose, Bld: 97 mg/dL (ref 70–99)
Potassium: 4.1 mEq/L (ref 3.5–5.1)
Sodium: 138 mEq/L (ref 135–145)

## 2012-01-22 MED ORDER — DOFETILIDE 250 MCG PO CAPS
375.0000 ug | ORAL_CAPSULE | Freq: Two times a day (BID) | ORAL | Status: DC
  Administered 2012-01-22 – 2012-01-23 (×3): 375 ug via ORAL
  Filled 2012-01-22 (×5): qty 1

## 2012-01-22 NOTE — Progress Notes (Signed)
Patient ID: [name], male   DOB: September 16, 1946, 66 y.o.   MRN: [id number] @ Subjective:  Denies SSCP, palpitations or Dyspnea No dizzyness on lower dose Tikosyn  Objective:  Filed Vitals:   01/21/12 0520 01/21/12 1355 01/21/12 2137 01/22/12 0546  BP: 127/80 157/83 145/90 116/66  Pulse: 57 55 63 51  Temp: 98.2 F (36.8 C) 97.6 F (36.4 C) 97.3 F (36.3 C)   TempSrc: Oral Oral Oral   Resp: 16 18 18 18   Height:      Weight:      SpO2: 97% 99% 98% 94%    Intake/Output from previous day:  Intake/Output Summary (Last 24 hours) at 01/22/12 0841 Last data filed at 01/21/12 2207  Gross per 24 hour  Intake   1083 ml  Output      0 ml  Net   1083 ml    Physical Exam: Affect appropriate Healthy:  appears stated age HEENT: normal Neck supple with no adenopathy JVP normal no bruits no thyromegaly Lungs clear with no wheezing and good diaphragmatic motion Heart:  S1/S2 no murmur, no rub, gallop or click PMI normal Abdomen: benighn, BS positve, no tenderness, no AAA no bruit.  No HSM or HJR Distal pulses intact with no bruits No edema Neuro non-focal Skin warm and dry No muscular weakness   Lab Results: Basic Metabolic Panel:  Basename 01/22/12 0600 01/21/12 0520  NA 138 138  K 4.1 3.9  CL 104 104  CO2 27 28  GLUCOSE 97 98  BUN 11 11  CREATININE 1.14 1.17  CALCIUM 8.6 8.4  MG 2.1 2.2  PHOS -- --    Imaging: No results found.  Cardiac Studies:  ECG: 3/7  SB 54 PR 320 QTc 462   3/8 SB 54 PR 320 QTc 468   Telemetry: Still with pauses caused by PAC;s  Medications:     . dofetilide  375 mcg Oral Q12H  . potassium chloride  40 mEq Oral Once  . sodium chloride  3 mL Intravenous Q12H  . DISCONTD: dofetilide  375 mcg Oral Q12H  . DISCONTD: dofetilide  500 mcg Oral Q12H       Assessment/Plan:  Arrhythmia:  Tolerating lower dose of Tikosyn with no dizzyness.  QT ok.  Multiple previous ablations with long PR makes blocked PAC;s leading to pauses more likely.   Patient leary of taking too much K supplements as he thinks he use to run high Constipation:  Told patient he could take fibercon  Charlton Haws 01/22/2012, 8:41 AM

## 2012-01-23 ENCOUNTER — Other Ambulatory Visit: Payer: Self-pay

## 2012-01-23 LAB — BASIC METABOLIC PANEL
CO2: 29 mEq/L (ref 19–32)
Chloride: 104 mEq/L (ref 96–112)
GFR calc Af Amer: 79 mL/min — ABNORMAL LOW (ref >90)
Potassium: 4.6 mEq/L (ref 3.5–5.1)
Sodium: 139 mEq/L (ref 135–145)

## 2012-01-23 LAB — MAGNESIUM: Magnesium: 2.1 mg/dL (ref 1.5–2.5)

## 2012-01-23 MED ORDER — DOFETILIDE 125 MCG PO CAPS: 125.0000 ug | ORAL_CAPSULE | Freq: Two times a day (BID) | ORAL | Status: DC

## 2012-01-23 MED ORDER — DOFETILIDE 250 MCG PO CAPS: 250.0000 ug | ORAL_CAPSULE | Freq: Two times a day (BID) | ORAL | Status: DC

## 2012-01-23 NOTE — Discharge Instructions (Signed)
***  PLEASE REMEMBER TO BRING ALL OF YOUR MEDICATIONS TO EACH OF YOUR FOLLOW-UP OFFICE VISITS.  

## 2012-01-23 NOTE — Progress Notes (Signed)
Pt IV and monitor D/C'd pt education completed, discharge instructions and follow up appointment given, medication given, pt verbalized understanding, denies questions or needs. pt d/c to home with wife

## 2012-01-23 NOTE — Progress Notes (Signed)
   Patient Name: [name] Date of Encounter: 01/23/2012     Principal Problem:  *Atrial flutter Active Problems:  Hypertension  Palpitations  First degree AV block    SUBJECTIVE  No c/p, sob.  Says that after doses of tikosyn he feels a little "drugged," but that this is tolerable.  QTc stable last night @ 443.  CURRENT MEDS    . dofetilide  375 mcg Oral Q12H  . sodium chloride  3 mL Intravenous Q12H    OBJECTIVE  Filed Vitals:   01/22/12 0546 01/22/12 1420 01/22/12 2202 01/23/12 0530  BP: 116/66 146/85 149/94 116/73  Pulse: 51 53 56 54  Temp:  98 F (36.7 C) 97.7 F (36.5 C) 97.8 F (36.6 C)  TempSrc:  Oral Oral Oral  Resp: 18 18 18 18   Height:      Weight:      SpO2: 94% 97% 97% 96%    Intake/Output Summary (Last 24 hours) at 01/23/12 0914 Last data filed at 01/22/12 1700  Gross per 24 hour  Intake    720 ml  Output      7 ml  Net    713 ml   Filed Weights   01/20/12 1531  Weight: 195 lb 1.7 oz (88.5 kg)    PHYSICAL EXAM  General: Pleasant, NAD. Neuro: Alert and oriented X 3. Moves all extremities spontaneously. Psych: Normal affect. HEENT:  Normal  Neck: Supple without bruits or JVD. Lungs:  Resp regular and unlabored, CTA. Heart: RRR no s3, s4, or murmurs. Abdomen: Soft, non-tender, non-distended, BS + x 4.  Extremities: No clubbing, cyanosis or edema. DP/PT/Radials 2+ and equal bilaterally.  Accessory Clinical Findings  Basic Metabolic Panel  Basename 01/23/12 0700 01/22/12 0600  NA 139 138  K 4.6 4.1  CL 104 104  CO2 29 27  GLUCOSE 98 97  BUN 11 11  CREATININE 1.09 1.14  CALCIUM 9.3 8.6  MG 2.1 2.1  PHOS -- --    TELE  Sinus, 1st deg avb, pac's w/ pauses < 3 secs following pac's.  ECG  Sinus brady, qtc 443 2 hrs after last nights dose.  Radiology/Studies  No results found.  ASSESSMENT AND PLAN  1. Paroxysmal atrial flutter:  Maintaining sinus on tikosyn.  QTc stable last night @ 443.  Received dose this am and  f/u ECG pending @ 10:15 am.  Provided that this looks ok, plan to d/c home today on current dose (375 mcg q12h).  Resume home dose of asa (not on any other anticoagulation).  Signed, Nicolasa Ducking NP  Patient seen and examined with Ward Givens, NP. We discussed all aspects of the encounter. I agree with the assessment and plan as stated above. ECG reviewed. QT is fine. Will plan d/c home today.   Alma Mohiuddin,MD 10:51 AM

## 2012-01-23 NOTE — Discharge Summary (Signed)
Patient ID: [name],  MRN: [id number], DOB/AGE: 1946-06-10 66 y.o.  Admit date: 01/20/2012 Discharge date: 01/23/2012  Primary Care Provider: Darnelle Bos, MD Primary Cardiologist: Odessa Fleming, MD  Discharge Diagnoses Principal Problem:  *Atrial flutter Active Problems:  Hypertension  Palpitations  First degree AV block   Allergies Allergies  Allergen Reactions  . Ace Inhibitors     Does not take due to incontinence   . Ceclor (Cefaclor) Hives  . Other     Does not tolerate Statins well  . Statins     Muscle weakness    Procedures  None  History of Present Illness  66 y/o male with h/o atrial arrhythmias including paroxysmal atrial flutter who was recently seen in clinic by Dr. Graciela Husbands with decision made to admit for elective Tikosyn loading.  Hospital Course  Pt was admitted on 3/6.  Baseline QTc was acceptable and pt was placed on tikosyn 500 mcg q 12 hours.  Unfortunately, following his first dose, he developed vertiginous symptoms and requested a lower dose.  His dose was reduced to 375 mcg q 12 hours.  He has tolerated this dose and his QTc has remained stable.  Following his sixth dose this am, his QTc is 431 msec.  He will be discharged home today in good condition.  Other than PAC's pt has not had any sustained atrial arrhythmias during this admission.  Discharge Vitals Blood pressure 116/73, pulse 54, temperature 97.8 F (36.6 C), temperature source Oral, resp. rate 18, height 6' (1.829 m), weight 195 lb 1.7 oz (88.5 kg), SpO2 96.00%.  Filed Weights   01/20/12 1531  Weight: 195 lb 1.7 oz (88.5 kg)   Labs  Basic Metabolic Panel  Basename 01/23/12 0700 01/22/12 0600  NA 139 138  K 4.6 4.1  CL 104 104  CO2 29 27  GLUCOSE 98 97  BUN 11 11  CREATININE 1.09 1.14  CALCIUM 9.3 8.6  MG 2.1 2.1  PHOS -- --   Disposition  Pt is being discharged home today in good condition.  Follow-up Plans & Appointments  Follow-up Information    Follow up with  Sherryl Manges, MD. (3-4 wks, we will arrange)    Contact information:   [address], [address] [place] [address] [phone]         Discharge Medications  Medication List  As of 01/23/2012 10:55 AM   TAKE these medications         aspirin 81 MG tablet   Take 81 mg by mouth daily.      B-12 PO   Take 1,000 mg by mouth daily.      COQ10 PO   Take by mouth daily.      dofetilide 125 MCG capsule   Commonly known as: TIKOSYN   Take 1 capsule (125 mcg total) by mouth every 12 (twelve) hours. Take in addition to 250 mcg tablet for a total of 375 mcg every 12 hrs      dofetilide 250 MCG capsule   Commonly known as: TIKOSYN   Take 1 capsule (250 mcg total) by mouth every 12 (twelve) hours. Take in addition to 125 mcg tablet for a total of 375 mcg every 12 hrs      propranolol 10 MG tablet   Commonly known as: INDERAL   Take 5 mg by mouth as needed. For PVCs      propranolol 10 MG tablet   Commonly known as: INDERAL  Take 10 mg by mouth daily. For tremor      VITAMIN D PO   Take 1,000 mg by mouth daily.           Outstanding Labs/Studies  None  Duration of Discharge Encounter   Greater than 30 minutes including physician time.  Signed, Nicolasa Ducking NP 01/23/2012, 10:55 AM   Patient seen and examined independently. Gilford Raid, NP note reviewed carefully - agree with his assessment and plan. I have edited the note based on my findings. Please see my rounding note from today for full details.  Teresina Bugaj,MD 5:40 PM

## 2012-01-24 NOTE — Progress Notes (Signed)
   CARE MANAGEMENT NOTE 01/24/2012  Patient:  [name], [name]   Account Number:  [id number]  Date Initiated:  01/21/2012  Documentation initiated by:  GRAVES-BIGELOW,BRENDA  Subjective/Objective Assessment:   Pt admitted for Tikosyn initiation. [facility] uses [facility] on [address] will call to make sure they can order or have medication available. [facility] is not enrolled in [place] program.     Action/Plan:   Anticipated DC Date:  01/24/2012   Anticipated DC Plan:  HOME/SELF CARE      DC Planning Services  CM consult      Choice offered to / List presented to:             Status of service:  Completed, signed off Medicare Important Message given?   (If response is "NO", the following Medicare IM given date fields will be blank) Date Medicare IM given:   Date Additional Medicare IM given:    Discharge Disposition:  HOME/SELF CARE  Per UR Regulation:    Comments:  01/24/2012 1515 (late entry 01/23/2012) Pt was given 7 day Tikosyn from main pharmacy by Unit RN at time of d/c. Verified with main pharmacy.  Isidoro Donning RN CCM Case Mgmt phone [phone]  01-21-12 [address], [place] [phone] Per rep @ [employer] [place], medication(Tier 4) is covered with a copay of $70.00 for a 30 day supply. No prior auth required. Pt had Blue Medicare Enhanced. Will make pt aware.  01-21-12 [address], [place] [phone] [facility] [address] and [facility] are enrolled in the program. CM will make pt aware of location and benefits check when completed. Pt will receive a 7 day supply when he leaves via the main pharmacy tikosyn fund. MD please write rx for tikosyn  no refills and then the original  rx with refills. RN please call care manager for assistance with 7 day supply of tikosyn when pt is ready to d/c. Thanks

## 2012-01-25 ENCOUNTER — Telehealth: Payer: Self-pay | Admitting: Internal Medicine

## 2012-01-25 DIAGNOSIS — I10 Essential (primary) hypertension: Secondary | ICD-10-CM

## 2012-01-25 DIAGNOSIS — I4891 Unspecified atrial fibrillation: Secondary | ICD-10-CM

## 2012-01-25 MED ORDER — DOFETILIDE 125 MCG PO CAPS: ORAL_CAPSULE | ORAL | Status: DC

## 2012-01-25 NOTE — Telephone Encounter (Signed)
I spoke with the patient. He states that he was given a prescription for Tikosyn 250 mcg BID and Tikosyn 125 mcg BID for a total of 375 mcg BID. He states that is giving him 2 co-pays. He would a prescription for Tikosyn 125 mcg three capsules BID. I will print this and mail to him.  He has other questions: 1) His maxide was discontinued. He reports am BP's in the hospital were ok, but the pm was in the 160-170's systolic. He is continuing with this problem at home. He states he has lisinopril 10 mg at home, but due to a prostatectomy ACE-I cause stress incontinence. He would be willing to try lisinopril 5 mg daily. He wants to avoid beta blockers to due his AV block? 2) He wants to know when he can undergo cataract surgery? 3) He wants to know does he need a stress test as he thought Dr. Graciela Husbands had mentioned that to him? 4) When should he follow up?

## 2012-01-25 NOTE — Telephone Encounter (Signed)
New msg Pt wants to talk to you about his meds. He said he just got out of hospital

## 2012-01-25 NOTE — Telephone Encounter (Signed)
Per Dr. Graciela Husbands, 1) start Cozaar 25 mg once daily if he has no problems with ARB's, if this is a problem, start amlodipine 2.5 mg once daily.  2) He may undergo cataract surgery at any time.  3) we will order a stress myoview for the patient.  4) follow up in 3-4 weeks with Dr. Graciela Husbands.  I have left a message for the patient to call.

## 2012-01-26 NOTE — Telephone Encounter (Signed)
I spoke with the patient about Dr. Odessa Fleming recommendations. He would like for me to review a letter he brought to the office today with Dr. Graciela Husbands regarding BP meds. I will call him back after reviewing with Dr. Graciela Husbands.

## 2012-01-28 ENCOUNTER — Telehealth: Payer: Self-pay | Admitting: Internal Medicine

## 2012-01-28 NOTE — Telephone Encounter (Signed)
Previous message open regarding this same thing. Will close this encounter.

## 2012-01-28 NOTE — Telephone Encounter (Signed)
Pt calling re medication, pls call [phone]

## 2012-01-28 NOTE — Telephone Encounter (Signed)
[name] 01/28/2012 2:17 PM Signed  Pt calling re medication, pls call [phone]  I left a message for the patient that Dr. Graciela Husbands reviewed his letter regarding ARB's. Per Dr. Graciela Husbands, he is uncertain what would be the most appropriate therapy for him at this point. We will review calcium channel blockers and A II blockers with [name]. She is aware and will let me know what might be appropriate. I will call the patient back next week with this information. Sherri Rad, RN, BSN

## 2012-02-02 ENCOUNTER — Telehealth: Payer: Self-pay | Admitting: Internal Medicine

## 2012-02-02 ENCOUNTER — Encounter (HOSPITAL_COMMUNITY): Payer: Medicare Other

## 2012-02-02 NOTE — Telephone Encounter (Signed)
Previous phone note already opened regarding this issue. Will close this and continue on previous note.

## 2012-02-02 NOTE — Telephone Encounter (Signed)
Melissa A Tatum 02/02/2012 10:09 AM Signed  Pt calling re still waiting to hear about his BP med, pls call

## 2012-02-02 NOTE — Telephone Encounter (Signed)
Pt calling re still waiting to hear about his BP med, pls call

## 2012-02-03 MED ORDER — AMLODIPINE BESYLATE 2.5 MG PO TABS: 2.5000 mg | ORAL_TABLET | Freq: Every day | ORAL | Status: DC

## 2012-02-03 NOTE — Telephone Encounter (Signed)
[name] spoke with me today and stated she found a study where calcium channel blockers should not affect stress incontinence. I have called and spoken with the patient's wife and made her aware as the patient is out and has been waiting for this call. I explained we will start amlodipine 2.5 mg once daily. I have advised the patient keep a track on his BP readings twice daily and call us should his night time readings still read a elevated.

## 2012-02-08 ENCOUNTER — Telehealth: Payer: Self-pay | Admitting: Internal Medicine

## 2012-02-08 DIAGNOSIS — I10 Essential (primary) hypertension: Secondary | ICD-10-CM

## 2012-02-08 DIAGNOSIS — I4891 Unspecified atrial fibrillation: Secondary | ICD-10-CM

## 2012-02-08 NOTE — Telephone Encounter (Signed)
I spoke with the patient and made him aware that Dr. Graciela Husbands had reviewed his BP readings that he dropped off this morning:  Thursday (am)- 140/92   (pm)- 150/85 Friday      (am)- 130/70   (pm)- 155/95 Saturday  (am)- 120/80   (pm)- 155/90 Sunday    (am)- 130/80   (pm)- 175/100 Monday    (am)- 150/90  The patient's note also questioned if Dr. Graciela Husbands would be ok with him increasing his amlodipine to 5 mg daily and then 7.5 mg daily if needed for BP control. Per Dr. Graciela Husbands, he is agreeable with this. I also spoke with the nuc med dept regarding BP cut off's for testing. Per Brett Canales in nuc med, if the DBP is > 100, they can convert him to a chemical stress test. I have relayed this to the patient as he states he called the nuc dept this morning, but wasn't told about his test being converted. He will go ahead and take an additional 2.5 mg of amlodipine now and he can take 5 mg in the morning. He is agreeable.

## 2012-02-08 NOTE — Telephone Encounter (Signed)
Pt states he left message re BP and may need to cxl stress test, pls call

## 2012-02-09 ENCOUNTER — Ambulatory Visit (HOSPITAL_COMMUNITY): Payer: Medicare Other | Attending: Cardiology | Admitting: Radiology

## 2012-02-09 VITALS — BP 144/87 | Ht 72.0 in | Wt 192.0 lb

## 2012-02-09 DIAGNOSIS — I10 Essential (primary) hypertension: Secondary | ICD-10-CM | POA: Insufficient documentation

## 2012-02-09 DIAGNOSIS — I4949 Other premature depolarization: Secondary | ICD-10-CM

## 2012-02-09 DIAGNOSIS — I4892 Unspecified atrial flutter: Secondary | ICD-10-CM | POA: Insufficient documentation

## 2012-02-09 DIAGNOSIS — R002 Palpitations: Secondary | ICD-10-CM | POA: Insufficient documentation

## 2012-02-09 DIAGNOSIS — I4891 Unspecified atrial fibrillation: Secondary | ICD-10-CM | POA: Insufficient documentation

## 2012-02-09 DIAGNOSIS — E785 Hyperlipidemia, unspecified: Secondary | ICD-10-CM | POA: Insufficient documentation

## 2012-02-09 MED ORDER — TECHNETIUM TC 99M TETROFOSMIN IV KIT
11.0000 | PACK | Freq: Once | INTRAVENOUS | Status: AC | PRN
  Administered 2012-02-09: 11 via INTRAVENOUS

## 2012-02-09 MED ORDER — TECHNETIUM TC 99M TETROFOSMIN IV KIT
33.0000 | PACK | Freq: Once | INTRAVENOUS | Status: AC | PRN
  Administered 2012-02-09: 33 via INTRAVENOUS

## 2012-02-09 NOTE — Progress Notes (Signed)
[facility] SITE 3 NUCLEAR MED [address] [place] [address] [phone]  Cardiology Nuclear Med Study  [name] is a 66 y.o. male     MRN : [id number]     DOB: Sep 30, 1946  Procedure Date: 02/09/2012  Nuclear Med Background Indication for Stress Test:  Evaluation for Ischemia History:  1980's QMV:HQION (+)>Cath:Normal; Multiple Ablations, Last '07; 2/13 Cardioversion; Atrial  Flutter  Cardiac Risk Factors: Hypertension and Lipids  Symptoms:  Palpitations   Nuclear Pre-Procedure Caffeine/Decaff Intake:  None NPO After: 8:00pm   Lungs:  Clear. IV 0.9% NS with Angio Cath:  20g  IV Site: R Antecubital  IV Started by:  Stanton Kidney, EMT-P  Chest Size (in):  44 Cup Size: n/a  Height: 6' (1.829 m)  Weight:  192 lb (87.091 kg)  BMI:  Body mass index is 26.04 kg/(m^2). Tech Comments:  Propranolol Held > 24 hours, per patient.    Nuclear Med Study 1 or 2 day study: 1 day  Stress Test Type:  Stress  Reading MD: Olga Millers, MD  Order Authorizing Provider:  [name], MD  Resting Radionuclide: Technetium 30m Tetrofosmin  Resting Radionuclide Dose: 11.0 mCi   Stress Radionuclide:  Technetium 2m Tetrofosmin  Stress Radionuclide Dose: 33.0 mCi           Stress Protocol Rest HR: 45 Stress HR: 148  Rest BP: 144/87 Stress BP: 212/95  Exercise Time (min): 12:00 METS: 13.4   Predicted Max HR: 153 bpm % Max HR: 96.73 bpm Rate Pressure Product: 62952   Dose of Adenosine (mg):  n/a Dose of Lexiscan: n/a mg  Dose of Atropine (mg): n/a Dose of Dobutamine: n/a mcg/kg/min (at max HR)  Stress Test Technologist: Smiley Houseman, CMA-N  Nuclear Technologist:  Doyne Keel, CNMT     Rest Procedure:  Myocardial perfusion imaging was performed at rest 45 minutes following the intravenous administration of Technetium 18m Tetrofosmin.  Rest ECG: Marked sinus bradycardia with blocked PAC's and nonspecific T-wave changes.  Stress Procedure:  The patient walked on the  treadmill for twelve minutes utilizing the Bruce protocol. He then stopped due to fatigue and denied any chest pain.  There were nonspecific ST-T wave changes, occasional PAC's, PVC's with couplets and atrial fibrillation immediately post exercise.  He also had a hypertensive response, 212/95.  Technetium 73m Tetrofosmin was injected at peak exercise and myocardial perfusion imaging was performed after a brief delay.  Stress ECG: No significant ST segment change suggestive of ischemia.  QPS Raw Data Images:  Acquisition technically good; normal left ventricular size. Stress Images:  There is decreased uptake in the distal anterior wall. Rest Images:  There is decreased uptake in the distal anterior wall, less prominent compared to the stress images. Subtraction (SDS):  These findings are consistent with mild distal anterior ischemia. Transient Ischemic Dilatation (Normal <1.22):  0.90 Lung/Heart Ratio (Normal <0.45):  0.26  Quantitative Gated Spect Images QGS EDV:  112 ml QGS ESV:  42 ml  Impression Exercise Capacity:  Excellent exercise capacity. BP Response:  Hypertensive blood pressure response. Clinical Symptoms:  No significant symptoms noted. ECG Impression:  No significant ST segment change suggestive of ischemia. Comparison with Prior Nuclear Study: No images to compare  Overall Impression:  Abnormal stress nuclear study with a small, mild, partially reversible distal anterior wall defect consistent with soft tissue attenuation and mild distal anterior ischemia.  LV Ejection Fraction: 62%.  LV Wall Motion:  NL LV Function; NL Wall  Motion    Olga Millers

## 2012-02-16 ENCOUNTER — Telehealth: Payer: Self-pay | Admitting: Internal Medicine

## 2012-02-16 DIAGNOSIS — I10 Essential (primary) hypertension: Secondary | ICD-10-CM

## 2012-02-16 DIAGNOSIS — I4891 Unspecified atrial fibrillation: Secondary | ICD-10-CM

## 2012-02-16 MED ORDER — AMLODIPINE BESYLATE 2.5 MG PO TABS: ORAL_TABLET | ORAL | Status: DC

## 2012-02-16 NOTE — Telephone Encounter (Signed)
New msg Pt said he will run out of amlodipine before refill is due because dose was adjusted Pt said he left BP readings at front desk  on Monday Please call him back

## 2012-02-16 NOTE — Telephone Encounter (Signed)
I spoke with the patient. I explained I had his BP readings for Dr. Graciela Husbands to review, but he was unfortunately called to the hospital for an emergency. In reviewing his BP readings, he is still ranging from 125-160/85-95. I explained we could have him increase his amlodipine to 7.5 mg daily. He would like to do this. I will send in his RX for amlodipine 2.5 mg three tablets daily. He is agreeable.

## 2012-02-25 ENCOUNTER — Encounter: Payer: Medicare Other | Admitting: Internal Medicine

## 2012-02-25 ENCOUNTER — Encounter: Payer: Self-pay | Admitting: Internal Medicine

## 2012-02-25 ENCOUNTER — Ambulatory Visit (INDEPENDENT_AMBULATORY_CARE_PROVIDER_SITE_OTHER): Payer: Medicare Other | Admitting: Internal Medicine

## 2012-02-25 VITALS — BP 133/74 | HR 48 | Ht 72.0 in | Wt 198.8 lb

## 2012-02-25 DIAGNOSIS — I10 Essential (primary) hypertension: Secondary | ICD-10-CM

## 2012-02-25 DIAGNOSIS — R9439 Abnormal result of other cardiovascular function study: Secondary | ICD-10-CM | POA: Insufficient documentation

## 2012-02-25 DIAGNOSIS — I4891 Unspecified atrial fibrillation: Secondary | ICD-10-CM

## 2012-02-25 NOTE — Progress Notes (Signed)
HPI  [name] is a 66 y.o. male Seen in [place] for atrial arrhythmias   He was recently admitted for tikosyn initiation which he his not tolerating well  He is having less blocked PACs.    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 Ronn Melena. He then developed an atrial flutter which was apparently somehow related to the prior ablation underwent catheter ablation and and required re\re ablation 2 years later 2007. He has had recurrent flutter on one occasion in 2010 prompting cardioversion and again last month  He also has multiple other arrhythmia things in the context of his bradycardia. He's had PVCs and other flutters.  He also describes in the evening a sensation of fluttering. When he checks his monitor with the he sees a pause on associated with a change in QRS morphology that appears to be approximately 2 times the PTT in  His exercise tolerance is quite good not withstanding his resting bradycardia. And first degree AV block His peak heart rate is up to the 130s. His left ventricular function is normal he has no known coronary disease  He under went thallium scanning in the 80s. False positive test and underwent catheterization that was normal.   Past Medical History  Diagnosis Date  . Hyperlipidemia   . Pulsatile tinnitus   . 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  . Atrial flutter -atypical 2007    ABLATION x2 [facility]  . Familial tremor   . Atrial tachycardia 2007    ANTEROSEPTAL ABLATION [facility]  . Hypertension   . First degree AV block   . Sinus pause assoc with blocked pwave   . Benign paroxysmal vertigo     Past Surgical History  Procedure Date  . Prostatectomy 02/20/2009    COMPLICATED  . Hernia repair     left side  . Umbilical hernia repair   . Shoulder surgery     Bilateral  . Atrial ablation surgery   . Tonsillectomy and  adenoidectomy   . Colonoscopy   . Cardioversion 12/03/2011    Procedure: CARDIOVERSION;  Surgeon: Duke Salvia, MD;  Location: [facility] OR;  Service: Cardiovascular;  Laterality: [name];  . Cardioversion 01/13/2012    Procedure: CARDIOVERSION;  Surgeon: Lewayne Bunting, MD;  Location: [facility] OR;  Service: Cardiovascular;  Laterality: [name];    Current Outpatient Prescriptions  Medication Sig Dispense Refill  . amLODipine (NORVASC) 2.5 MG tablet Take three tablets by mouth once daily.  90 tablet  6  . aspirin 81 MG tablet Take 81 mg by mouth daily.       . Cholecalciferol (VITAMIN D PO) Take 1,000 mg by mouth daily.       . Coenzyme Q10 (COQ10 PO) Take by mouth daily.        . Cyanocobalamin (B-12 PO) Take 1,000 mg by mouth daily.       Marland Kitchen dofetilide (TIKOSYN) 125 MCG capsule Take three capsules by mouth twice daily  180 capsule  6  . propranolol (INDERAL) 10 MG tablet Take 10 mg by mouth daily. For tremor      . propranolol (INDERAL) 10 MG tablet Take 5 mg by mouth as needed. For PVCs        Allergies  Allergen Reactions  . Ace Inhibitors     Does not take due to incontinence   . Ceclor (Cefaclor) Hives  .  Other     Does not tolerate Statins well  . Statins     Muscle weakness    Review of Systems negative except from HPI and PMH  Physical Exam There were no vitals taken for this visit. Well developed and well nourished in no acute distress HENT normal E scleral and icterus clear Neck Supple JVP flat; carotids brisk and full Clear to ausculation Regular rate and rhythm, no murmurs gallops or rub Soft with active bowel sounds No clubbing cyanosis none Edema Alert and oriented, grossly normal motor and sensory function Skin Warm and Dry  Sinus rhythm  At 44   Intervals .35/10/48  QTc42  Assessment and  Plan

## 2012-02-25 NOTE — Assessment & Plan Note (Signed)
The patient has had recurrent atrial arrhythmias currently on dofetilide. He thinks that he is tolerating very poorly. I have been in contact with Dr. Delena Serve. To be considered for ablation.  In terms of the fatigue that he attributes the dofetilide, we will try a three-day exclusion of his amlodipine first, in the event that this has not worked ecreae th  dofetilede from 375-250 twice daily

## 2012-02-25 NOTE — Patient Instructions (Signed)
Your physician recommends that you continue on your current medications as directed. Please refer to the Current Medication list given to you today.  We will be back in touch with you after Dr. Graciela Husbands speaks to Dr. Delena Serve and also discusses your stress test further.

## 2012-02-25 NOTE — Assessment & Plan Note (Signed)
We will try an exclusion of amlodipine. The event that this is associated with improvement in symptoms he did try to find a different antihypertensive but does not involve the angiotensin system because of bladder relaxation, conduction system because of first degree AV block.

## 2012-02-25 NOTE — Assessment & Plan Note (Signed)
I will review his Myoview scan with Dr. Marsa Aris. He has known coronary artery calcification for catheterization might be the next step Next

## 2012-02-29 ENCOUNTER — Telehealth: Payer: Self-pay | Admitting: Internal Medicine

## 2012-02-29 NOTE — Telephone Encounter (Signed)
Patient called no answer.LMTC. 

## 2012-02-29 NOTE — Telephone Encounter (Signed)
LMTCB ./CY 

## 2012-02-29 NOTE — Telephone Encounter (Signed)
Patient called,stated he has been holding norvasc and his side effects are better.States he was told Dr.Klein will be checking to see what B/P medication to prescribe for him.Patient wants to be called back today.

## 2012-02-29 NOTE — Telephone Encounter (Signed)
Pt calling requesting a call, did not wish to elaborate as to what the call is regarding

## 2012-03-01 ENCOUNTER — Other Ambulatory Visit: Payer: Self-pay | Admitting: *Deleted

## 2012-03-01 DIAGNOSIS — I10 Essential (primary) hypertension: Secondary | ICD-10-CM

## 2012-03-01 MED ORDER — NIFEDIPINE ER OSMOTIC RELEASE 30 MG PO TB24: 30.0000 mg | ORAL_TABLET | Freq: Every day | ORAL | Status: DC

## 2012-03-01 NOTE — Telephone Encounter (Signed)
Started on procardia xl 30 mg once daily.

## 2012-03-01 NOTE — Telephone Encounter (Signed)
Need problem:  patient was seen on yesterday, returning call back to nurse Wynona Canes

## 2012-03-01 NOTE — Telephone Encounter (Signed)
Will forward to Dr. Klein. 

## 2012-03-02 NOTE — Telephone Encounter (Signed)
He called me this am and said he found data regarding drug interaction between nifedipine and dofetilide  I am unaware of this and haviin gdone a pubmed search with these twpo drugs i can find nothing Dr Abner Greenspan has graciously picked up the ball;  She will also search and get up with me and Dr Lenard Forth

## 2012-03-02 NOTE — Telephone Encounter (Signed)
I have done a literature review as well and cannot find any information on this interaction.  I called the patient.  He found the information on drugs.com.  I reviewed what the website said "Coadministration with inhibitors of CYP450 3A4 may increase the plasma concentrations of dofetilide, which is partially metabolized by the isoenzyme".  I was unable to determine what reference this information came from, but suspect it is inferred due to common routes of metabolism.  Dofetilide is metabolized by a small extent by the CYP3A4 isoenzyme but is not an inducer.  Nifedipine is a major substrate of CYP3A4 but does not induce or inhibit 3A4.  Based on this information, I would expected the potential interaction to be clinically insignificant.   The patient has called [employer] as well and they were unable to identify any reports of an interaction in their database.   Dr. Lenard Forth is agreeable to start nifedipine and will fill the prescription today.  He will let us know if he receives any differing information from [employer].

## 2012-03-07 ENCOUNTER — Telehealth: Payer: Self-pay | Admitting: Internal Medicine

## 2012-03-07 NOTE — Telephone Encounter (Signed)
Pt to See Dr Delena Serve @ [facility] app 5/16 and has ablation 5/17, he will be starting Xarelto 20 mg this week and stopping tikosyn 5 days prior to ablation. He wants to know if he can change/stop  his Nifedipine and go back to diazide at that point? Pt aware Dr/Nurse out of office today and back tomorrow, please call pt to advise.

## 2012-03-07 NOTE — Telephone Encounter (Signed)
Please return call to patient at [phone]  Patient has questions concerning upcoming ablation in [facility], he can be reached at [phone].

## 2012-03-07 NOTE — Telephone Encounter (Signed)
Yes  Once off tikosyn he can resume his dzyaide

## 2012-03-08 NOTE — Telephone Encounter (Signed)
Bid is a good idea

## 2012-03-08 NOTE — Telephone Encounter (Signed)
I spoke with the patient regarding Dr. Odessa Fleming recommendations. He states that in the interim, his morning BP's have started to rise along with his evening BP. He reports running 140/90 in the mornings. He relates that Dr. Graciela Husbands had discussed possibly increasing his nifedipine to twice daily. He is currently on 30 mg once daily. I will forward to Dr. Graciela Husbands. I will also forward records to Dr. Delena Serve.

## 2012-03-09 NOTE — Telephone Encounter (Addendum)
I spoke with the patient. He is aware of Dr. Odessa Fleming recommendations to increase his nifedipine to 30 mg BID. His records have also been faxed to Dr. Alfonso Patten office at [facility].

## 2012-03-16 HISTORY — PX: INSERT / REPLACE / REMOVE PACEMAKER: SUR710

## 2012-03-18 ENCOUNTER — Telehealth: Payer: Self-pay | Admitting: Internal Medicine

## 2012-03-18 DIAGNOSIS — I10 Essential (primary) hypertension: Secondary | ICD-10-CM

## 2012-03-18 MED ORDER — NIFEDIPINE ER OSMOTIC RELEASE 30 MG PO TB24: 60.0000 mg | ORAL_TABLET | Freq: Every day | ORAL | Status: DC

## 2012-03-18 NOTE — Telephone Encounter (Signed)
Refill-NIFEdipine (PROCARDIA XL/ADALAT-CC) 30 MG 24 hr tablet   Patient was told to increase med to 2x daily about 10 days ago.  Patient running out of medication due to new dosage instructions. Verified preferred as [facility], Patient can be reached at [phone] today  for additional information.

## 2012-03-31 ENCOUNTER — Encounter: Payer: Self-pay | Admitting: *Deleted

## 2012-03-31 ENCOUNTER — Encounter: Payer: Self-pay | Admitting: Internal Medicine

## 2012-03-31 DIAGNOSIS — H8110 Benign paroxysmal vertigo, unspecified ear: Secondary | ICD-10-CM | POA: Insufficient documentation

## 2012-03-31 DIAGNOSIS — I4819 Other persistent atrial fibrillation: Secondary | ICD-10-CM | POA: Insufficient documentation

## 2012-03-31 DIAGNOSIS — I440 Atrioventricular block, first degree: Secondary | ICD-10-CM | POA: Insufficient documentation

## 2012-03-31 DIAGNOSIS — I495 Sick sinus syndrome: Secondary | ICD-10-CM | POA: Insufficient documentation

## 2012-04-01 HISTORY — PX: ATRIAL ABLATION SURGERY: SHX560

## 2012-04-04 ENCOUNTER — Telehealth: Payer: Self-pay | Admitting: Internal Medicine

## 2012-04-04 NOTE — Telephone Encounter (Signed)
I spoke with the patient. He is s/p a-fib ablation and PPM implant on 5/17 with Dr. Delena Serve at [facility]. He states he is supposed to f/u within 2 weeks with Dr. Graciela Husbands. [place] for the beach on 6[place]. We will have the patient come on 5/31 for his appointment with Dr. Graciela Husbands at 9 am.

## 2012-04-04 NOTE — Telephone Encounter (Signed)
Pt had ablation and pacer in [place] on the 17th and he needs to talk about f/u appt

## 2012-04-05 ENCOUNTER — Other Ambulatory Visit: Payer: Self-pay | Admitting: Internal Medicine

## 2012-04-05 DIAGNOSIS — I10 Essential (primary) hypertension: Secondary | ICD-10-CM

## 2012-04-05 MED ORDER — NIFEDIPINE ER OSMOTIC RELEASE 30 MG PO TB24
60.0000 mg | ORAL_TABLET | Freq: Every day | ORAL | Status: DC
Start: 2012-04-05 — End: 2012-08-17

## 2012-04-05 NOTE — Telephone Encounter (Signed)
New msg Pt called about last nifedipine 30 mg was two tablets daily . He needs it resent for 60 tablets to [facility]

## 2012-04-15 ENCOUNTER — Encounter: Payer: Self-pay | Admitting: Internal Medicine

## 2012-04-15 ENCOUNTER — Ambulatory Visit (INDEPENDENT_AMBULATORY_CARE_PROVIDER_SITE_OTHER): Payer: Medicare Other | Admitting: Internal Medicine

## 2012-04-15 ENCOUNTER — Encounter: Payer: Self-pay | Admitting: *Deleted

## 2012-04-15 VITALS — BP 136/96 | HR 63 | Ht 72.0 in | Wt 195.8 lb

## 2012-04-15 DIAGNOSIS — Z950 Presence of cardiac pacemaker: Secondary | ICD-10-CM

## 2012-04-15 DIAGNOSIS — I10 Essential (primary) hypertension: Secondary | ICD-10-CM

## 2012-04-15 DIAGNOSIS — I4891 Unspecified atrial fibrillation: Secondary | ICD-10-CM

## 2012-04-15 DIAGNOSIS — I4892 Unspecified atrial flutter: Secondary | ICD-10-CM

## 2012-04-15 DIAGNOSIS — I498 Other specified cardiac arrhythmias: Secondary | ICD-10-CM

## 2012-04-15 LAB — PACEMAKER DEVICE OBSERVATION
AL AMPLITUDE: 2.5 mv
ATRIAL PACING PM: 39.9
BAMS-0001: 170 {beats}/min
BATTERY VOLTAGE: 3.0603 V
VENTRICULAR PACING PM: 4.16

## 2012-04-15 NOTE — Assessment & Plan Note (Signed)
The patient underwent complex multi-target ablation at [facility]. Thereafter he had no further spontaneous firing. He is on dofetilide is to stay on it for another 4 weeks or so. He is tolerating it okay from a dizziness point of view. He is on Rivaroxaban and this became continued for 4 weeks.

## 2012-04-15 NOTE — Progress Notes (Signed)
HPI  [name] is a 66 y.o. male Seen in followup for recurrent atrial arrhythmias. He has intercurrently been in [facility] and undergone another ablation and subsequent pacemaker implantation. The target of his ablation included a left anteroseptal cyst. Crista terminalis, mid right atrial septum, coronary sinus, wide circumferential encircling and of the pulmonary veins with a left atrial isthmus and roof line as well as isolation of the superior vena cava.  PR prolongation was noted following the procedure associated with intra-atrial conduction delay with normal AH HV intervals and he underwent pacemaker implantation.  His dofetilide dose was changed from 375->>250 secondary to lightheadedness. With recommendations that he continue it for 6 weeks post ablation and  Rivaroxaban for now. Past Medical History  Diagnosis Date  . Hyperlipidemia   . Pulsatile tinnitus   . 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  . Atrial flutter -atypical 2007    ABLATION x2 [facility]  . Familial tremor   . Atrial tachycardia 2007    ANTEROSEPTAL ABLATION [facility]  . Hypertension   . First degree AV block   . Sinus pause assoc with blocked pwave   . Benign paroxysmal vertigo     Past Surgical History  Procedure Date  . Prostatectomy 02/20/2009    COMPLICATED  . Hernia repair     left side  . Umbilical hernia repair   . Shoulder surgery     Bilateral  . Atrial ablation surgery   . Tonsillectomy and adenoidectomy   . Colonoscopy   . Cardioversion 12/03/2011    Procedure: CARDIOVERSION;  Surgeon: Duke Salvia, MD;  Location: [facility] OR;  Service: Cardiovascular;  Laterality: [name];  . Cardioversion 01/13/2012    Procedure: CARDIOVERSION;  Surgeon: Lewayne Bunting, MD;  Location: [facility] OR;  Service: Cardiovascular;  Laterality: [name];    Current Outpatient Prescriptions  Medication Sig Dispense Refill  . acyclovir (ZOVIRAX) 200 MG capsule Take 200 mg by mouth as needed.       .  Cholecalciferol (VITAMIN D PO) Take 1,000 mg by mouth daily.       . Coenzyme Q10 (COQ10 PO) Take 200 mg by mouth daily.       . Cyanocobalamin (B-12 PO) Take 1,000 mg by mouth daily.       Marland Kitchen dofetilide (TIKOSYN) 125 MCG capsule Take 250 mcg by mouth 2 (two) times daily. Take three capsules by mouth twice daily      . NIFEdipine (PROCARDIA XL/ADALAT-CC) 30 MG 24 hr tablet Take 2 tablets (60 mg total) by mouth daily.  60 tablet  6  . propranolol (INDERAL) 10 MG tablet Take 10 mg by mouth daily. For tremor      . XARELTO 20 MG TABS Take 20 mg by mouth daily.       Marland Kitchen DISCONTD: dofetilide (TIKOSYN) 125 MCG capsule Take three capsules by mouth twice daily  180 capsule  6  . aspirin 81 MG tablet Take 81 mg by mouth daily.       Marland Kitchen DISCONTD: propranolol (INDERAL) 10 MG tablet Take 5 mg by mouth as needed. For PVCs        Allergies  Allergen Reactions  . Ace Inhibitors     Does not take due to incontinence   . Ceclor (Cefaclor) Hives  . Other     Does not tolerate Statins well  . Statins     Muscle weakness    Review of Systems negative except from HPI and PMH  Physical Exam BP 136/96  Pulse 63  Ht 6' (1.829 m)  Wt 195 lb 12.8 oz (88.814 kg)  BMI 26.56 kg/m2 Well developed and well nourished in no acute distress HENT normal E scleral and icterus clear Neck Supple JVP flat; carotids brisk and full Clear to ausculation Pocket pocket well-healed  Regular rhythm  no murmurs gallops or rub Soft with active bowel sounds No clubbing cyanosis none Edema Alert and oriented, grossly normal motor and sensory function Skin Warm and Dry   ECG today demonstrates sinus rhythm at 63 intervals 0.40/09/42   Assessment and  Plan \

## 2012-04-15 NOTE — Assessment & Plan Note (Signed)
Evaluated and has 4% ventricular pacing at the lower rate limit. I am not sure this is related to blocked PACs or intermittent heart block.

## 2012-04-15 NOTE — Patient Instructions (Signed)
Your physician recommends that you continue on your current medications as directed. Please refer to the Current Medication list given to you today.  Your physician recommends that you schedule a follow-up appointment in: 6 weeks with Dr. Klein.   

## 2012-04-15 NOTE — Assessment & Plan Note (Signed)
We'll habit changes nifedinpine to bid

## 2012-04-25 ENCOUNTER — Ambulatory Visit (HOSPITAL_COMMUNITY): Payer: Medicare Other | Attending: Cardiology | Admitting: Radiology

## 2012-04-25 ENCOUNTER — Telehealth: Payer: Self-pay | Admitting: *Deleted

## 2012-04-25 DIAGNOSIS — R011 Cardiac murmur, unspecified: Secondary | ICD-10-CM

## 2012-04-25 DIAGNOSIS — I4891 Unspecified atrial fibrillation: Secondary | ICD-10-CM | POA: Insufficient documentation

## 2012-04-25 DIAGNOSIS — I440 Atrioventricular block, first degree: Secondary | ICD-10-CM | POA: Insufficient documentation

## 2012-04-25 DIAGNOSIS — I10 Essential (primary) hypertension: Secondary | ICD-10-CM | POA: Insufficient documentation

## 2012-04-25 DIAGNOSIS — E785 Hyperlipidemia, unspecified: Secondary | ICD-10-CM | POA: Insufficient documentation

## 2012-04-25 DIAGNOSIS — I4892 Unspecified atrial flutter: Secondary | ICD-10-CM | POA: Insufficient documentation

## 2012-04-25 DIAGNOSIS — I517 Cardiomegaly: Secondary | ICD-10-CM | POA: Insufficient documentation

## 2012-04-25 NOTE — Telephone Encounter (Signed)
Follow up on previous call:  Patient spoke with Dr. Graciela Husbands over the weekend regarding set up an echo appt.

## 2012-04-25 NOTE — Telephone Encounter (Signed)
Per Dr. Graciela Husbands, Dr. Lenard Forth discovered a new heart murmur on himself this weekend. He needs to have an echo done. I will contact the patient to schedule. I have left a message for Falecha in echo scheduling to see if I can have the open 2:00 pm slot today.

## 2012-04-25 NOTE — Telephone Encounter (Signed)
OK per Falecha to use the 2:00 pm echo slot today. The patient is aware and agreeable.

## 2012-04-25 NOTE — Progress Notes (Signed)
Echocardiogram performed.  

## 2012-04-27 ENCOUNTER — Encounter (HOSPITAL_COMMUNITY)
Admission: AD | Disposition: A | Discharge status: Home / Self Care | Payer: Self-pay | Source: Ambulatory Visit | Attending: Cardiology

## 2012-04-27 ENCOUNTER — Encounter: Payer: Self-pay | Admitting: Physician Assistant

## 2012-04-27 ENCOUNTER — Encounter: Payer: Self-pay | Admitting: Internal Medicine

## 2012-04-27 ENCOUNTER — Other Ambulatory Visit: Payer: Self-pay

## 2012-04-27 ENCOUNTER — Telehealth: Payer: Self-pay | Admitting: Nurse Practitioner

## 2012-04-27 ENCOUNTER — Encounter (HOSPITAL_COMMUNITY): Payer: Self-pay | Admitting: Anesthesiology

## 2012-04-27 ENCOUNTER — Ambulatory Visit (HOSPITAL_COMMUNITY): Payer: Medicare Other | Admitting: Anesthesiology

## 2012-04-27 ENCOUNTER — Ambulatory Visit (INDEPENDENT_AMBULATORY_CARE_PROVIDER_SITE_OTHER): Payer: Medicare Other | Admitting: Physician Assistant

## 2012-04-27 ENCOUNTER — Ambulatory Visit (HOSPITAL_COMMUNITY)
Admission: AD | Admit: 2012-04-27 | Discharge: 2012-04-27 | Disposition: A | Discharge status: Home / Self Care | Payer: Medicare Other | Source: Ambulatory Visit | Attending: Cardiology | Admitting: Cardiology

## 2012-04-27 ENCOUNTER — Ambulatory Visit (INDEPENDENT_AMBULATORY_CARE_PROVIDER_SITE_OTHER): Payer: Medicare Other | Admitting: *Deleted

## 2012-04-27 VITALS — BP 112/77 | HR 85 | Ht 72.0 in | Wt 191.0 lb

## 2012-04-27 DIAGNOSIS — I471 Supraventricular tachycardia: Secondary | ICD-10-CM

## 2012-04-27 DIAGNOSIS — I498 Other specified cardiac arrhythmias: Secondary | ICD-10-CM

## 2012-04-27 DIAGNOSIS — I4892 Unspecified atrial flutter: Secondary | ICD-10-CM

## 2012-04-27 HISTORY — PX: CARDIOVERSION: SHX1299

## 2012-04-27 LAB — PACEMAKER DEVICE OBSERVATION
AL AMPLITUDE: 0.5 mv
AL IMPEDENCE PM: 456 Ohm
ATRIAL PACING PM: 33.3
BAMS-0001: 170 {beats}/min
RV LEAD AMPLITUDE: 12 mv
RV LEAD IMPEDENCE PM: 551 Ohm
RV LEAD THRESHOLD: 1 V

## 2012-04-27 SURGERY — CARDIOVERSION
Anesthesia: Monitor Anesthesia Care

## 2012-04-27 SURGERY — CARDIOVERSION
Anesthesia: Monitor Anesthesia Care | Wound class: Clean

## 2012-04-27 MED ORDER — LACTATED RINGERS IV SOLN
INTRAVENOUS | Status: DC | PRN
  Administered 2012-04-27: 14:00:00 via INTRAVENOUS

## 2012-04-27 MED ORDER — SODIUM CHLORIDE 0.9 % IJ SOLN: 3.0000 mL | INTRAMUSCULAR | Status: DC | PRN

## 2012-04-27 MED ORDER — PROPOFOL 10 MG/ML IV EMUL
INTRAVENOUS | Status: DC | PRN
  Administered 2012-04-27 (×2): 50 mg via INTRAVENOUS

## 2012-04-27 MED ORDER — HYDROCORTISONE 1 % EX CREA
1.0000 "application " | TOPICAL_CREAM | Freq: Three times a day (TID) | CUTANEOUS | Status: DC | PRN
  Filled 2012-04-27 (×2): qty 28

## 2012-04-27 MED ORDER — SODIUM CHLORIDE 0.9 % IV SOLN: 250.0000 mL | INTRAVENOUS | Status: DC

## 2012-04-27 MED ORDER — LIDOCAINE HCL 1 % IJ SOLN
INTRAMUSCULAR | Status: DC | PRN
  Administered 2012-04-27: 50 mg via INTRADERMAL

## 2012-04-27 MED ORDER — SODIUM CHLORIDE 0.9 % IJ SOLN: 3.0000 mL | Freq: Two times a day (BID) | INTRAMUSCULAR | Status: DC

## 2012-04-27 NOTE — Patient Instructions (Addendum)
YOU HAVE BEEN INSTRUCTED TODAY TO GO TO SHORT STAY AT [place] AND REGISTER AND THEN GO TO THE CATH LAB HOLDING AREA. NOTHING TO EAT OR DRINK AS OF NOW

## 2012-04-27 NOTE — Transfer of Care (Signed)
Immediate Anesthesia Transfer of Care Note  Patient: [name]  Procedure(s) Performed: Procedure(s) (LRB): CARDIOVERSION ([name])  Patient Location: PACU and Short Stay  Anesthesia Type: MAC  Level of Consciousness: awake, alert  and oriented  Airway & Oxygen Therapy: Patient Spontanous Breathing and Patient connected to nasal cannula oxygen  Post-op Assessment: Report given to PACU RN  Post vital signs: Reviewed and stable  Complications: No apparent anesthesia complications

## 2012-04-27 NOTE — Discharge Instructions (Signed)
Electrical Cardioversion Cardioversion is the delivery of a jolt of electricity to change the rhythm of the heart. Sticky patches or metal paddles are placed on the chest to deliver the electricity from a special device. This is done to restore a normal rhythm. A rhythm that is too fast or not regular keeps the heart from pumping well. Compared to medicines used to change an abnormal rhythm, cardioversion is faster and works better. It is also unpleasant and may dislodge blood clots from the heart. WHEN WOULD THIS BE DONE?  In an emergency:   There is low or no blood pressure as a result of the heart rhythm.   Normal rhythm must be restored as fast as possible to protect the brain and heart from further damage.   It may save a life.   For less serious heart rhythms, such as atrial fibrillation or flutter, in which:   The heart is beating too fast or is not regular.   The heart is still able to pump enough blood, but not as well as it should.   Medicine to change the rhythm has not worked.   It is safe to wait in order to allow time for preparation.  LET YOUR CAREGIVER KNOW ABOUT:   Every medicine you are taking. It is very important to do this! Know when to take or stop taking any of them.   Any time in the past that you have felt your heart was not beating normally.  RISKS AND COMPLICATIONS   Clots may form in the chambers of the heart if it is beating too fast. These clots may be dislodged during the procedure and travel to other parts of the body.   There is risk of a stroke during and after the procedure if a clot moves. Blood thinners lower this risk.   You may have a special test of your heart (TEE) to make sure there are no clots in your heart.  BEFORE THE PROCEDURE   You may have some tests to see how well your heart is working.   You may start taking blood thinners so your blood does not clot as easily.   Other drugs may be given to help your heart work better.   PROCEDURE (SCHEDULED)  The procedure is typically done in a hospital by a heart doctor (cardiologist).   You will be told when and where to go.   You may be given some medicine through an intravenous (IV) access to reduce discomfort and make you sleepy before the procedure.   Your whole body may move when the shock is delivered. Your chest may feel sore.   You may be able to go home after a few hours. Your heart rhythm will be watched to make sure it does not change.  HOME CARE INSTRUCTIONS   Only take medicine as directed by your caregiver. Be sure you understand how and when to take your medicine.   Learn how to feel your pulse and check it often.   Limit your activity for 48 hours.   Avoid caffeine and other stimulants as directed.  SEEK MEDICAL CARE IF:   You feel like your heart is beating too fast or your pulse is not regular.   You have any questions about your medicines.   You have bleeding that will not stop.  SEEK IMMEDIATE MEDICAL CARE IF:   You are dizzy or feel faint.   It is hard to breathe or you feel short of breath.     There is a change in discomfort in your chest.   Your speech is slurred or you have trouble moving your arm or leg on one side.   You get a muscle cramp.   Your fingers or toes turn cold or blue.  MAKE SURE YOU:   Understand these instructions.   Will watch your condition.   Will get help right away if you are not doing well or get worse.  Document Released: 10/23/2002 Document Revised: 10/22/2011 Document Reviewed: 02/22/2008 [employer] Patient Information 2012 [employer]. 

## 2012-04-27 NOTE — Preoperative (Signed)
Beta Blockers   Reason not to administer Beta Blockers:Not Applicable 

## 2012-04-27 NOTE — Progress Notes (Signed)
[address] [place], [place]  [address] Phone: [phone] Fax:  [phone]  Date:  04/27/2012   Name:  [name]   DOB:  1945-12-13   MRN:  [id number]  PCP:  Darnelle Bos, MD  Primary Cardiologist/Primary Electrophysiologist:  Dr. Sherryl Manges    History of Present Illness: [name] is a 66 y.o. male who returns for evaluation of palpitations.    He has a h/o recurrent atrial arrhythmias.  He has had several ablative procedures and DCCVs.  Placed on Tikosyn in 01/2012.  Recently underwent another ablation procedure and subsequent pacer implant at [facility]. His Tikosyn dose at [facility] was changed from 375 to 250 due to lightheadedness.  He is on Rivaroxaban.    Last echo 04/25/12: EF 55-60%, grade 2 diast dysfxn, trivial AI, dilated ao root (43 mm), trivial MR, mod LAE, mild RAE, PASP 26.  Myoview 01/2012:  EF 62%, small mild partially reversible distal ant defect c/w soft tissue atten and mild distal ant ischemia.  Notes indicate Dr. Sherryl Manges would review findings with Dr. Olga Millers.  Of note, patient has a hx of a false + nuclear test with subsequent cardiac cath without significant obstructive CAD in the 1980s.    Patient noted feeling fatigued this am around 5.  Has a home tele monitor and looked to be out of rhythm.  Also noted a heart murmur himself last week and had the above echo done.  He denies chest pain or significant dyspnea.  No syncope.  No orthopnea, PND or edema.  Feels his heart is irregular.  Did have pacer interrogated this am.  It was suspected he is in 2:1 AFlutter with VP and he was added on to my schedule.  He has not missed his Rivaroxaban since his ablation last month.    Wt Readings from Last 3 Encounters:  04/27/12 191 lb (86.637 kg)  04/15/12 195 lb 12.8 oz (88.814 kg)  02/25/12 198 lb 12.8 oz (90.175 kg)     Potassium  Date/Time Value Range Status  01/23/2012  7:00 AM 4.6  3.5 - 5.1 mEq/L Final     Creat    Date/Time Value Range Status  01/20/2012  9:40 AM 1.09  0.50 - 1.35 mg/dL Final     Creatinine, Ser  Date/Time Value Range Status  01/23/2012  7:00 AM 1.09  0.50 - 1.35 mg/dL Final     ALT  Date/Time Value Range Status  02/12/2009  9:22 AM 21  0 - 53 U/L Final     Hemoglobin  Date/Time Value Range Status  01/13/2012 12:29 PM 15.3  13.0 - 17.0 g/dL Final   Magnesium  Date/Time Value Range Status  01/23/2012  7:00 AM 2.1  1.5 - 2.5 mg/dL Final    Past Medical History  Diagnosis Date  . Hyperlipidemia   . Pulsatile tinnitus   . 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  . Atrial flutter -atypical 2007    ABLATION x2 [facility]  . Familial tremor   . Atrial tachycardia 2007    ANTEROSEPTAL ABLATION [facility]  . Hypertension   . First degree AV block   . Sinus pause assoc with blocked pwave   . Benign paroxysmal vertigo     Current Outpatient Prescriptions  Medication Sig Dispense Refill  . acyclovir (ZOVIRAX) 200 MG capsule Take 200 mg by mouth as needed.       Marland Kitchen aspirin 81 MG tablet Take 81  mg by mouth daily.       . Cholecalciferol (VITAMIN D PO) Take 1,000 mg by mouth daily.       . Coenzyme Q10 (COQ10 PO) Take 200 mg by mouth daily.       . Cyanocobalamin (B-12 PO) Take 1,000 mg by mouth daily.       Marland Kitchen dofetilide (TIKOSYN) 125 MCG capsule Take 250 mcg by mouth 2 (two) times daily. Take three capsules by mouth twice daily      . NIFEdipine (PROCARDIA XL/ADALAT-CC) 30 MG 24 hr tablet Take 2 tablets (60 mg total) by mouth daily.  60 tablet  6  . propranolol (INDERAL) 10 MG tablet Take 10 mg by mouth daily. For tremor      . XARELTO 20 MG TABS Take 20 mg by mouth daily.         Allergies: Allergies  Allergen Reactions  . Ace Inhibitors     Does not take due to incontinence   . Ceclor (Cefaclor) Hives  . Other     Does not tolerate Statins well  . Statins     Muscle weakness    History  Substance Use Topics  . Smoking status: Never Smoker   .  Smokeless tobacco: Never Used  . Alcohol Use: 1.5 oz/week    3 drink(s) per week     ROS:  Please see the history of present illness.   No fevers, chills, cough, melena, hematochezia, vomiting or diarrhea.  All other systems reviewed and negative.   PHYSICAL EXAM: VS:  BP 112/77  Pulse 85  Ht 6' (1.829 m)  Wt 191 lb (86.637 kg)  BMI 25.90 kg/m2 Well nourished, well developed, in no acute distress HEENT: normal Neck: no JVD Cardiac:  normal S1, S2; irregular rhythm; 1/6 systolic murmur along LSB Lungs:  clear to auscultation bilaterally, no wheezing, rhonchi or rales Abd: soft, nontender, no hepatomegaly Ext: no edema Skin: warm and dry Neuro:  CNs 2-12 intact, no focal abnormalities noted  EKG:   V paced, HR 80   ASSESSMENT AND PLAN:  1.  Atrial Fibrillation/Flutter His device was interrogated again.  He is still in 2:1 Flutter. Seen with Dr. Hillis Range.  He tried to pace him out of AFlutter into NSR several times without success. He has remained on Xarelto. Patient is interested in proceeding with DCCV today. Will review with our hospital team and try to get DCCV arrange for today. . . Will proceed with DCCV today. Follow up with Dr. Sherryl Manges as directed.   2.  Hypertension Controlled.  Continue current therapy.    [name], PA-C  10:52 AM 04/27/2012

## 2012-04-27 NOTE — Anesthesia Postprocedure Evaluation (Signed)
  Anesthesia Post-op Note  Patient: [name]  Procedure(s) Performed: Procedure(s) (LRB): CARDIOVERSION ([name])  Patient Location: PACU and Short Stay  Anesthesia Type: MAC  Level of Consciousness: awake, alert  and oriented  Airway and Oxygen Therapy: Patient Spontanous Breathing and Patient connected to nasal cannula oxygen  Post-op Pain: none  Post-op Assessment: Post-op Vital signs reviewed, Patient's Cardiovascular Status Stable, Respiratory Function Stable, Patent Airway and No signs of Nausea or vomiting  Post-op Vital Signs: Reviewed and stable  Complications: No apparent anesthesia complications

## 2012-04-27 NOTE — Telephone Encounter (Signed)
Received call from [name] this AM stating that he noted on his home tele monitor this AM that his rate was elevated in the 80's without evidence of p waves.  He's concerned that he had afib.  Later he noted V pacing above his lower set rate.  He plans to walk-in @ the office this AM for interrogation.  He is currently asymptomatic.  I advised that that would be appropriate.

## 2012-04-27 NOTE — CV Procedure (Signed)
Electrical Cardioversion Procedure Note [name] [id number] 1945/12/25  Procedure: Electrical Cardioversion Indications:  Atrial Flutter  Procedure Details Consent: Risks of procedure as well as the alternatives and risks of each were explained to the (patient/caregiver).  Consent for procedure obtained. Time Out: Verified patient identification, verified procedure, site/side was marked, verified correct patient position, special equipment/implants available, medications/allergies/relevent history reviewed, required imaging and test results available.  Performed  Patient placed on cardiac monitor, pulse oximetry, supplemental oxygen as necessary.  Sedation given: Etomidate Pacer pads placed anterior and posterior chest.  Cardioverted 2 time(s).  Cardioverted at 150J. Unsuccessful.  Evaluation Findings: Post procedure EKG shows: Atrial Flutter Complications: None Patient did tolerate procedure well.   Cassell Clement 04/27/2012, 2:40 PM

## 2012-04-27 NOTE — Progress Notes (Signed)
Pacer interrogation only   

## 2012-04-27 NOTE — Anesthesia Preprocedure Evaluation (Addendum)
Anesthesia Evaluation  Patient identified by MRN, date of birth, ID band Patient awake    Reviewed: Allergy & Precautions, H&P , NPO status , Patient's Chart, lab work & pertinent test results, reviewed documented beta blocker date and time   Airway Mallampati: II TM Distance: >3 FB Neck ROM: full    Dental  (+) Dental Advidsory Given and Teeth Intact   Pulmonary          Cardiovascular hypertension, On Home Beta Blockers + dysrhythmias Atrial Fibrillation + pacemaker Rhythm:irregular Rate:Normal     Neuro/Psych    GI/Hepatic   Endo/Other    Renal/GU      Musculoskeletal   Abdominal   Peds  Hematology   Anesthesia Other Findings   Reproductive/Obstetrics                          Anesthesia Physical Anesthesia Plan  ASA: III  Anesthesia Plan: General   Post-op Pain Management:    Induction: Intravenous  Airway Management Planned: Mask  Additional Equipment:   Intra-op Plan:   Post-operative Plan:   Informed Consent: I have reviewed the patients History and Physical, chart, labs and discussed the procedure including the risks, benefits and alternatives for the proposed anesthesia with the patient or authorized representative who has indicated his/her understanding and acceptance.   Dental Advisory Given  Plan Discussed with: CRNA, Anesthesiologist and Surgeon  Anesthesia Plan Comments:        Anesthesia Quick Evaluation

## 2012-04-28 ENCOUNTER — Telehealth: Payer: Self-pay | Admitting: Internal Medicine

## 2012-04-28 NOTE — Anesthesia Postprocedure Evaluation (Signed)
  Anesthesia Post-op Note  Patient: [name]  Procedure(s) Performed: Procedure(s) (LRB): CARDIOVERSION ([name])  Patient Location: Short Stay  Anesthesia Type: General  Level of Consciousness: awake, alert , oriented and patient cooperative  Airway and Oxygen Therapy: Patient Spontanous Breathing and Patient connected to nasal cannula oxygen  Post-op Pain: none  Post-op Assessment: Post-op Vital signs reviewed, Patient's Cardiovascular Status Stable, Respiratory Function Stable, Patent Airway, No signs of Nausea or vomiting and Pain level controlled  Post-op Vital Signs: stable  Complications: No apparent anesthesia complications

## 2012-04-28 NOTE — Telephone Encounter (Signed)
New Problem:    Patient called because Dr. Delena Serve would like Dr. Graciela Husbands to call him. Cell [phone].  Patient would like to know when Dr. Graciela Husbands would like to follow-up with him because he had a cardioversion.  Please call back.

## 2012-04-28 NOTE — Telephone Encounter (Signed)
Dr. Graciela Husbands called and spoke with Dr. Delena Serve. Dr. Delena Serve will call the patient and have him increase his Tikosyn. The patient will need to be cardioverted next week. Dr. Graciela Husbands would like to do this on Tuesday. This will need to be set up with the reader. Dr. Graciela Husbands will call and speak with the patient about this and his echo results. I will forward to triage and ask that they call Dr. Lenard Forth tomorrow to set up a DCCV for 6/18 since I am out of the office tomorrow. Dr. Graciela Husbands will want to know what time this is set for.

## 2012-04-29 ENCOUNTER — Encounter (HOSPITAL_COMMUNITY): Payer: Self-pay | Admitting: Cardiology

## 2012-04-29 ENCOUNTER — Encounter: Payer: Self-pay | Admitting: *Deleted

## 2012-04-29 ENCOUNTER — Other Ambulatory Visit: Payer: Medicare Other

## 2012-04-29 NOTE — Telephone Encounter (Signed)
Pt very reluctant to have cardioversion, states he just had ablation and was cardioverted within three weeks at [facility]. Pt reluctant to have necessary lab work since he has had done last week, I don't see record of it here- it was done elsewhere, pt very frustrated with process. Pt agreed to have labs if he goes through with procedure, he is to meet today with Pacer rep at hospital for interrogation and a discission will be made, pt aware to have labs done here and get his cardioversion letter. Dr Graciela Husbands informed of change of date for procedure due to unavailability of anesthesia for Tuesday.

## 2012-04-29 NOTE — Telephone Encounter (Signed)
I will cancel cardioversion for Monday, I will forward msg to Sherri Rad RN to see when to put pt into schedule/ Dr Graciela Husbands only here Monday. Discussed with Marlowe Kays, send to nurse.

## 2012-04-29 NOTE — Telephone Encounter (Signed)
Pt came to hosp  Aflutte/tach at HR 180 with 2:1 conduction  Atrial undersensing at 0.34mv and oversensing at 0.15 mv .  In last 48 hrs, pt has atrially paced about 40% of the time which I infer is sinus as his own sinus rate is normally in the 50s. Device programmed DDI at 70 on the higher dose of tikosyn and we will see next week as to if he is in and out.  If so, Joice Lofts would be a failure and will try a IC agent.  If not will cardiovert on friday

## 2012-05-02 NOTE — H&P (Signed)
History and Physical  Date: 04/27/2012   Name: [name]   DOB: 1946/08/29   MRN: [id number]   PCP: Darnelle Bos, MD  Primary Cardiologist/Primary Electrophysiologist: Dr. Sherryl Manges   History of Present Illness:  [name] is a 66 y.o. male who returns for evaluation of palpitations.   He has a h/o recurrent atrial arrhythmias. He has had several ablative procedures and DCCVs. Placed on Tikosyn in 01/2012. Recently underwent another ablation procedure and subsequent pacer implant at [facility]. His Tikosyn dose at [facility] was changed from 375 to 250 due to lightheadedness. He is on Rivaroxaban.   Last echo 04/25/12: EF 55-60%, grade 2 diast dysfxn, trivial AI, dilated ao root (43 mm), trivial MR, mod LAE, mild RAE, PASP 26.  Myoview 01/2012: EF 62%, small mild partially reversible distal ant defect c/w soft tissue atten and mild distal ant ischemia. Notes indicate Dr. Sherryl Manges would review findings with Dr. Olga Millers. Of note, patient has a hx of a false + nuclear test with subsequent cardiac cath without significant obstructive CAD in the 1980s.   Patient noted feeling fatigued this am around 5. Has a home tele monitor and looked to be out of rhythm. Also noted a heart murmur himself last week and had the above echo done. He denies chest pain or significant dyspnea. No syncope. No orthopnea, PND or edema. Feels his heart is irregular. Did have pacer interrogated this am. It was suspected he is in 2:1 AFlutter with VP and he was added on to my schedule. He has not missed his Rivaroxaban since his ablation last month.    Wt Readings from Last 3 Encounters:   04/27/12  191 lb (86.637 kg)   04/15/12  195 lb 12.8 oz (88.814 kg)   02/25/12  198 lb 12.8 oz (90.175 kg)    Potassium   Date/Time  Value  Range  Status   01/23/2012 7:00 AM  4.6  3.5 - 5.1 mEq/L  Final      Creat   Date/Time  Value  Range  Status   01/20/2012 9:40 AM  1.09  0.50 - 1.35 mg/dL  Final      Creatinine, Ser    Date/Time  Value  Range  Status   01/23/2012 7:00 AM  1.09  0.50 - 1.35 mg/dL  Final      ALT   Date/Time  Value  Range  Status   02/12/2009 9:22 AM  21  0 - 53 U/L  Final      Hemoglobin   Date/Time  Value  Range  Status   01/13/2012 12:29 PM  15.3  13.0 - 17.0 g/dL  Final    Magnesium   Date/Time  Value  Range  Status   01/23/2012 7:00 AM  2.1  1.5 - 2.5 mg/dL  Final     Past Medical History   Diagnosis  Date   .  Hyperlipidemia    .  Pulsatile tinnitus    .  Incontinence      DECREASING INCONTINENCE RELATED TO PROSTATECTOMY.   .  Atrial flutter -atypical  2007     ABLATION x2 [facility]   .  Familial tremor    .  Atrial tachycardia  2007     ANTEROSEPTAL ABLATION [facility]   .  Hypertension    .  First degree AV block    .  Sinus pause assoc with blocked pwave    .  Benign paroxysmal vertigo  Current Outpatient Prescriptions   Medication  Sig  Dispense  Refill   .  acyclovir (ZOVIRAX) 200 MG capsule  Take 200 mg by mouth as needed.     Marland Kitchen  aspirin 81 MG tablet  Take 81 mg by mouth daily.     .  Cholecalciferol (VITAMIN D PO)  Take 1,000 mg by mouth daily.     .  Coenzyme Q10 (COQ10 PO)  Take 200 mg by mouth daily.     .  Cyanocobalamin (B-12 PO)  Take 1,000 mg by mouth daily.     Marland Kitchen  dofetilide (TIKOSYN) 125 MCG capsule  Take 250 mcg by mouth 2 (two) times daily. Take three capsules by mouth twice daily     .  NIFEdipine (PROCARDIA XL/ADALAT-CC) 30 MG 24 hr tablet  Take 2 tablets (60 mg total) by mouth daily.  60 tablet  6   .  propranolol (INDERAL) 10 MG tablet  Take 10 mg by mouth daily. For tremor     .  XARELTO 20 MG TABS  Take 20 mg by mouth daily.       Allergies:  Allergies   Allergen  Reactions   .  Ace Inhibitors      Does not take due to incontinence   .  Ceclor (Cefaclor)  Hives   .  Other      Does not tolerate Statins well   .  Statins      Muscle weakness     History   Substance Use Topics   .  Smoking status:  Never Smoker   .  Smokeless tobacco:   Never Used   .  Alcohol Use:  1.5 oz/week     3 drink(s) per week     ROS: Please see the history of present illness. No fevers, chills, cough, melena, hematochezia, vomiting or diarrhea. All other systems reviewed and negative.   PHYSICAL EXAM:  VS: BP 112/77  Pulse 85  Ht 6' (1.829 m)  Wt 191 lb (86.637 kg)  BMI 25.90 kg/m2  Well nourished, well developed, in no acute distress  HEENT: normal  Neck: no JVD  Cardiac: normal S1, S2; irregular rhythm; 1/6 systolic murmur along LSB  Lungs: clear to auscultation bilaterally, no wheezing, rhonchi or rales  Abd: soft, nontender, no hepatomegaly  Ext: no edema  Skin: warm and dry  Neuro: CNs 2-12 intact, no focal abnormalities noted   EKG: V paced, HR 80   ASSESSMENT AND PLAN:   1. Atrial Fibrillation/Flutter  His device was interrogated again. He is still in 2:1 Flutter.  Seen with Dr. Hillis Range. He tried to pace him out of AFlutter into NSR several times without success.  He has remained on Xarelto.  Patient is interested in proceeding with DCCV today.  Will review with our hospital team and try to get DCCV arrange for today. . . Will proceed with DCCV today.  Follow up with Dr. Sherryl Manges as directed.   2. Hypertension  Controlled. Continue current therapy.    [name], PA-C 10:52 AM 04/27/2012  Jarold Song

## 2012-05-02 NOTE — Telephone Encounter (Signed)
Per my discussion with Dr. Graciela Husbands today, the patient is supposed to be in touch with Gwendolyn Grant on Thursday to reassess his a-fib.

## 2012-05-05 ENCOUNTER — Telehealth: Payer: Self-pay | Admitting: *Deleted

## 2012-05-05 DIAGNOSIS — I4891 Unspecified atrial fibrillation: Secondary | ICD-10-CM

## 2012-05-05 NOTE — Telephone Encounter (Signed)
Call received from Dr. Graciela Husbands stating the patient needs a 48 hour holter for a-fib today if possible. I have spoken with Windell Moulding and she is waiting on holters to come in hopefully today. I have notified the patient of this. He is aware he may not get this until tomorrow. I will place the order and have Windell Moulding call him back.

## 2012-05-06 ENCOUNTER — Telehealth: Payer: Self-pay | Admitting: Internal Medicine

## 2012-05-06 NOTE — Telephone Encounter (Signed)
Pt calling back, saying he left a message at 900a today for ruth to call him, I do not see this message, pt would like a call today re monitor

## 2012-05-10 ENCOUNTER — Encounter (INDEPENDENT_AMBULATORY_CARE_PROVIDER_SITE_OTHER): Payer: Medicare Other

## 2012-05-10 DIAGNOSIS — I4891 Unspecified atrial fibrillation: Secondary | ICD-10-CM

## 2012-05-12 ENCOUNTER — Telehealth: Payer: Self-pay | Admitting: Internal Medicine

## 2012-05-12 DIAGNOSIS — I4891 Unspecified atrial fibrillation: Secondary | ICD-10-CM

## 2012-05-12 NOTE — Telephone Encounter (Signed)
I left a message at the patient's home # that I have his monitor for Dr. Graciela Husbands to review. He will be in tomorrow.

## 2012-05-12 NOTE — Telephone Encounter (Signed)
Please return call to patient at [phone] or [phone]  Patient returning holter monitor 05/12/12, would like to hear from Dr. Graciela Husbands his decision on cardioversion or other treatment.

## 2012-05-13 ENCOUNTER — Encounter: Payer: Self-pay | Admitting: Internal Medicine

## 2012-05-13 NOTE — Telephone Encounter (Signed)
I discussed the Myoview results in the context of his Holter. The latter shows intermittent atrial pacing consistent with intermittent atrial arrhythmias supporting the hypothesis that dofetilide is ineffective or incompletely effective in managing his atrial fibrillation.  I will be in touch with Dr. Johnnette Litter regarding continuing that dofetilide or considering an alternative agent. Because of the abnormal Myoview scan, he will need to undergo catheterization prior to utilizing a 1C antiarrhythmic. He is agreeable and we'll schedule with Dr. [name]

## 2012-05-17 ENCOUNTER — Encounter: Payer: Self-pay | Admitting: *Deleted

## 2012-05-17 NOTE — Telephone Encounter (Signed)
I spoke with the patient regarding his heart catheterization. He is scheduled for 05/23/12 at 9:30 am with Dr. Riley Kill. He will come on 7/3 for his pre-procedure labs and to pick up a copy of his instructions. Sherri Rad, RN, BSN  The patient also wanted to know if Dr. Graciela Husbands was able to speak with Dr. Delena Serve at [facility]. I explained he had not mentioned to me that he had spoken with him. I will ask him tomorrow. He also wanted to know if Dr. Graciela Husbands had found out if there is a contraindication between Tikosyn and Xylocaine/Epi for dental work. Sherri Rad, RN, BSN

## 2012-05-18 ENCOUNTER — Telehealth: Payer: Self-pay | Admitting: Internal Medicine

## 2012-05-18 ENCOUNTER — Other Ambulatory Visit (INDEPENDENT_AMBULATORY_CARE_PROVIDER_SITE_OTHER): Payer: Medicare Other

## 2012-05-18 DIAGNOSIS — I4891 Unspecified atrial fibrillation: Secondary | ICD-10-CM

## 2012-05-18 LAB — CBC WITH DIFFERENTIAL/PLATELET
Basophils Relative: 0.6 % (ref 0.0–3.0)
Hemoglobin: 13.8 g/dL (ref 13.0–17.0)
Lymphocytes Relative: 18.9 % (ref 12.0–46.0)
MCHC: 33.1 g/dL (ref 30.0–36.0)
Monocytes Relative: 9.9 % (ref 3.0–12.0)
Neutro Abs: 3.6 10*3/uL (ref 1.4–7.7)
RBC: 4.55 Mil/uL (ref 4.22–5.81)

## 2012-05-18 LAB — BASIC METABOLIC PANEL
BUN: 15 mg/dL (ref 6–23)
CO2: 28 mEq/L (ref 19–32)
Chloride: 104 mEq/L (ref 96–112)
Creatinine, Ser: 1.2 mg/dL (ref 0.4–1.5)
Glucose, Bld: 99 mg/dL (ref 70–99)

## 2012-05-18 NOTE — Telephone Encounter (Signed)
Prior phone note open. Will close this encounter.

## 2012-05-18 NOTE — Telephone Encounter (Signed)
[name] 05/18/2012 8:09 AM Signed  Please return call to patient at [phone] regarding Tikosyn dosage reduction.   The patient is aware of Dr. Odessa Fleming recommendations. I will be back in touch with him on Friday to let him know if Dr. Graciela Husbands has spoken with Dr. Delena Serve.

## 2012-05-18 NOTE — Telephone Encounter (Signed)
Have left message with Dr. Delena Serve. Right now I would keep his dofetilide dose is tolerated.   I think 3 literature compliance although 1 agents together i.e. !a 1b  and 1c;  the concern of QT prolongation as Tikosyn is a class III drug. 1a prolonged QT intervals but class IB and class IC drugs do not. He should be able to take a lidocaine with his Tikosyn.

## 2012-05-18 NOTE — Telephone Encounter (Signed)
Please return call to patient at [phone] regarding Tikosyn dosage reduction.

## 2012-05-20 NOTE — Telephone Encounter (Signed)
Per Dr. Graciela Husbands, he has not heard from Dr. Delena Serve as of today. I inquired if the patient could drop his dose of Tikosyn back down to 250 mcg BID if he felt better on this dose, per Dr. Graciela Husbands, this should be ok since just changing the dose should not affect his a-fib very much. He has already failed the tikosyn, but Dr. Graciela Husbands would like to consult with Dr. Delena Serve to see if leaving him on it a little longer may help with his a-fib after he is completely healed from his ablation. I have left a message for the patient to call.

## 2012-05-23 ENCOUNTER — Inpatient Hospital Stay (HOSPITAL_BASED_OUTPATIENT_CLINIC_OR_DEPARTMENT_OTHER)
Admission: RE | Admit: 2012-05-23 | Discharge: 2012-05-23 | Disposition: A | Discharge status: Home / Self Care | Payer: Medicare Other | Source: Ambulatory Visit | Attending: Cardiology | Admitting: Cardiology

## 2012-05-23 ENCOUNTER — Encounter (HOSPITAL_BASED_OUTPATIENT_CLINIC_OR_DEPARTMENT_OTHER)
Admission: RE | Disposition: A | Discharge status: Home / Self Care | Payer: Self-pay | Source: Ambulatory Visit | Attending: Cardiology

## 2012-05-23 DIAGNOSIS — I2510 Atherosclerotic heart disease of native coronary artery without angina pectoris: Secondary | ICD-10-CM

## 2012-05-23 DIAGNOSIS — I10 Essential (primary) hypertension: Secondary | ICD-10-CM | POA: Insufficient documentation

## 2012-05-23 DIAGNOSIS — Z950 Presence of cardiac pacemaker: Secondary | ICD-10-CM | POA: Insufficient documentation

## 2012-05-23 DIAGNOSIS — I440 Atrioventricular block, first degree: Secondary | ICD-10-CM | POA: Insufficient documentation

## 2012-05-23 DIAGNOSIS — I4891 Unspecified atrial fibrillation: Secondary | ICD-10-CM | POA: Insufficient documentation

## 2012-05-23 DIAGNOSIS — E785 Hyperlipidemia, unspecified: Secondary | ICD-10-CM | POA: Insufficient documentation

## 2012-05-23 SURGERY — JV LEFT HEART CATHETERIZATION WITH CORONARY ANGIOGRAM
Anesthesia: Moderate Sedation

## 2012-05-23 MED ORDER — ACETAMINOPHEN 325 MG PO TABS: 650.0000 mg | ORAL_TABLET | ORAL | Status: DC | PRN

## 2012-05-23 MED ORDER — SODIUM CHLORIDE 0.9 % IV SOLN
INTRAVENOUS | Status: DC
  Administered 2012-05-23: 09:00:00 via INTRAVENOUS

## 2012-05-23 MED ORDER — SODIUM CHLORIDE 0.9 % IV SOLN: INTRAVENOUS | Status: DC

## 2012-05-23 NOTE — Progress Notes (Signed)
Bedrest begins @ 1100.  Tegaderm dressing applied by Army Melia

## 2012-05-23 NOTE — Interval H&P Note (Signed)
History and Physical Interval Note:  05/23/2012 9:50 AM  [name]  has presented today for surgery, with the diagnosis of abn ST  The various methods of treatment have been discussed with the patient. After consideration of risks, benefits and other options for treatment, the patient has consented to  * No procedures listed * as a surgical intervention .  The patient's history has been reviewed, patient examined, no change in status, stable for surgery.  I have reviewed the patients' chart and labs.  Questions were answered to the patient's satisfaction.     [name]

## 2012-05-23 NOTE — H&P (Signed)
HPI: [name] was sent in by [name] for cardiac cath.  He has an abnormal radionuclide study, similar to that many years ago.  He is asymptomatic.  He has had four prior ablations for atrial fib, but the last did not work, and Dr. Graciela Husbands has decided to use a 1C drug, necessitating the exclusion of CAD.  He spoke with him by phone.  [name] has had a prior cath study.  He stopped his Xarelto on Friday and has not had any since.   Current Facility-Administered Medications  Medication Dose Route Frequency Provider Last Rate Last Dose  . 0.9 %  sodium chloride infusion   Intravenous Continuous Herby Abraham, MD 75 mL/hr at 05/23/12 0900      Allergies  Allergen Reactions  . Ace Inhibitors Other (See Comments)    Does not take due to incontinence   . Ceclor (Cefaclor) Hives  . Statins Other (See Comments)    Muscle weakness    Past Medical History  Diagnosis Date  . Hyperlipidemia   . Pulsatile tinnitus   . 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  . Atrial flutter -atypical 2007    ABLATION x2 [facility]  . Familial tremor   . Atrial tachycardia 2007    ANTEROSEPTAL ABLATION [facility]  . Hypertension   . First degree AV block   . Sinus pause assoc with blocked pwave   . Benign paroxysmal vertigo   . Abnormal stress test     3/13    Past Surgical History  Procedure Date  . Prostatectomy 02/20/2009    COMPLICATED  . Hernia repair     left side  . Umbilical hernia repair   . Shoulder surgery     Bilateral  . Atrial ablation surgery   . Tonsillectomy and adenoidectomy   . Colonoscopy   . Cardioversion 12/03/2011    Procedure: CARDIOVERSION;  Surgeon: [facility] Salvia, MD;  Location: [facility] OR;  Service: Cardiovascular;  Laterality: [name];  . Cardioversion 01/13/2012    Procedure: CARDIOVERSION;  Surgeon: Lewayne Bunting, MD;  Location: [facility] OR;  Service: Cardiovascular;  Laterality: [name];  . Cardioversion 04/27/2012    Procedure: CARDIOVERSION;  Surgeon: Cassell Clement, MD;  Location: [facility] OR;  Service: Cardiovascular;  Laterality: [name];    Family History  Problem Relation Age of Onset  . Colon cancer Sister     History   Social History  . Marital Status: Married    Spouse Name: [name]    Number of Children: [name]  . Years of Education: [name]   Occupational History  . Not on file.   Social History Main Topics  . Smoking status: Never Smoker   . Smokeless tobacco: Never Used  . Alcohol Use: 1.5 oz/week    3 drink(s) per week  . Drug Use: No  . Sexually Active: Not on file   Other Topics Concern  . Not on file   Social History Narrative  . No narrative on file    ROS: Please see the HPI.  All other systems reviewed and negative.  PHYSICAL EXAM:  BP 123/96  Pulse 79  Resp 16  Ht 6' (1.829 m)  Wt 191 lb (86.637 kg)  BMI 25.90 kg/m2  SpO2 99%  General: Well developed, well nourished, in no acute distress. Head:  Normocephalic and atraumatic. Neck: no JVD.  No carotid bruits.   Lungs: Clear to auscultation and percussion. Heart: Normal S1 and S2.  No murmur,  rubs or gallops.  Abdomen:  Normal bowel sounds; soft; non tender; no organomegaly Pulses: Pulses normal in all 4 extremities. Extremities: No clubbing or cyanosis. No edema. Neurologic: Alert and oriented x 3.  EKG:  ASSESSMENT AND PLAN:  1.  Recurrent atrial fibrillation sp ablation times four.  2.  Abnormal myoview scan 3.  History of some coronary calcification by cath in past.    Plan cardiac cath study.  He is agreeable to proceed.

## 2012-05-23 NOTE — CV Procedure (Signed)
   Cardiac Catheterization Procedure Note  Name: [name] MRN: [id number] DOB: 1946/11/11  Procedure: Left Heart Cath, Selective Coronary Angiography, LV angiography  Indication: abnormal nuclear scan in asymptomatic patient with need for consideration of 1C AA drug therapy.  Patient stopped Xarelto several days ago.    Procedural details: The right groin was prepped, draped, and anesthetized with 1% lidocaine. Using modified Seldinger technique, a 4 French sheath was introduced into the right femoral artery. Standard Judkins catheters were used for coronary angiography and left ventriculography. Catheter exchanges were performed over a guidewire. There were no immediate procedural complications. The patient was transferred to the post catheterization recovery area for further monitoring.  Dr. Graciela Husbands reviewed the films and suggested possible lead dislodgment of the atrial lead.    Procedural Findings: Hemodynamics:  AO 116/72 (91) LV 106/10   Coronary angiography: Coronary dominance: right  Left mainstem: Mild ostial tapering without obstruction  Left anterior descending (LAD): Mild luminal irregularity with segmental plaque of 50% or less near the proximal mid junction.  The distal vessel is smaller  Left circumflex (LCx): Provides a very large OM and then modest size AV circumflex.  The circumflex has an 80% leading into and crossing the bifurcation and the AV circumflex has a 95% eccentric proximal stenosis.    Right coronary artery (RCA): large caliber vessel with heavily calcified 60% eccentric plaque in the distal vessel.    Left ventriculography: Left ventricular systolic function is normal, LVEF is estimated at 55-65%, there is no significant mitral regurgitation   Final Conclusions:   1.  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  2.  Moderate stenosis of the RCA 3.  Mild diffuse plaque of the LAD 4.  Recurrent atrial  fibrillation unresponsive to ablation and AA therapy 5.  Possible lead dislodgment of the atrial lead post pacer implant.    Recommendations:  1.  Have reviewed with Dr. Graciela Husbands and Clifton James, and will review with Dr. Fuller Mandril today for the team approach to decision making 2.  He will likely need lead revision.    [name] 05/23/2012, 12:09 PM

## 2012-05-23 NOTE — Telephone Encounter (Signed)
I spoke with the patient and he is aware of Dr. Graciela Husbands stating it is ok to decrease the dose of Tikosyn back to 250 mcg bid if he feels better on that dose. I explained Dr. Graciela Husbands had not heard back from Dr. Delena Serve as of Friday afternoon. We will be back in touch with the patient after hearing from Dr. Delena Serve. He is agreeable. He is for cath today with Dr. Riley Kill.

## 2012-05-24 ENCOUNTER — Telehealth: Payer: Self-pay | Admitting: *Deleted

## 2012-05-24 ENCOUNTER — Other Ambulatory Visit: Payer: Self-pay | Admitting: Internal Medicine

## 2012-05-24 DIAGNOSIS — I4891 Unspecified atrial fibrillation: Secondary | ICD-10-CM

## 2012-05-24 DIAGNOSIS — T82190A Other mechanical complication of cardiac electrode, initial encounter: Secondary | ICD-10-CM

## 2012-05-24 MED ORDER — PROPRANOLOL HCL 10 MG PO TABS: ORAL_TABLET | ORAL | Status: DC

## 2012-05-24 NOTE — Telephone Encounter (Signed)
Dr. Graciela Husbands left another message for Dr. Delena Serve today to please call and discuss Tikosyn.

## 2012-05-24 NOTE — Telephone Encounter (Signed)
Will forward to Dr. Graciela Husbands- is it ok to refill propranolol 10 mg one tablet TID as needed?

## 2012-05-24 NOTE — Telephone Encounter (Signed)
Left message for patient to call.  RA lead revision scheduled for 05-25-2012 with Dr [name]. Patient should be NPO after midnight tonight.  He should be at short stay at [facility] tomorrow.  Ok to take medications with water morning of procedure.  When patient calls back, please forward message to Julieta Gutting, RN to review instructions.

## 2012-05-24 NOTE — Telephone Encounter (Signed)
I spoke with the pt and made him aware of instructions that Amber had given in previous message.  The pt also said that he wanted to get a new Rx sent to [facility]) for a 30 day supply of Propranolol 10mg  take one by mouth three times a day as needed. The pt states Dr Graciela Husbands has filled propranolol this way in the past because the pt also uses it to treat tremors and migraines.  I will forward this message to [facility] and Dr Graciela Husbands to review and then authorize prescription.

## 2012-05-24 NOTE — Telephone Encounter (Signed)
OK per Dr. Graciela Husbands to fill propranolol 10 mg TID as needed.

## 2012-05-25 ENCOUNTER — Encounter (HOSPITAL_COMMUNITY): Payer: Self-pay | Admitting: Pharmacy Technician

## 2012-05-25 ENCOUNTER — Ambulatory Visit (HOSPITAL_COMMUNITY)
Admission: RE | Admit: 2012-05-25 | Discharge: 2012-05-26 | Disposition: A | Discharge status: Home / Self Care | Payer: Medicare Other | Source: Ambulatory Visit | Attending: Internal Medicine | Admitting: Internal Medicine

## 2012-05-25 ENCOUNTER — Encounter (HOSPITAL_COMMUNITY)
Admission: RE | Disposition: A | Discharge status: Home / Self Care | Payer: Self-pay | Source: Ambulatory Visit | Attending: Internal Medicine

## 2012-05-25 ENCOUNTER — Encounter (HOSPITAL_COMMUNITY): Payer: Self-pay | Admitting: General Practice

## 2012-05-25 DIAGNOSIS — I471 Supraventricular tachycardia: Secondary | ICD-10-CM | POA: Diagnosis present

## 2012-05-25 DIAGNOSIS — I4891 Unspecified atrial fibrillation: Secondary | ICD-10-CM

## 2012-05-25 DIAGNOSIS — T82190A Other mechanical complication of cardiac electrode, initial encounter: Secondary | ICD-10-CM

## 2012-05-25 DIAGNOSIS — I4892 Unspecified atrial flutter: Secondary | ICD-10-CM

## 2012-05-25 DIAGNOSIS — I440 Atrioventricular block, first degree: Secondary | ICD-10-CM | POA: Insufficient documentation

## 2012-05-25 DIAGNOSIS — I2510 Atherosclerotic heart disease of native coronary artery without angina pectoris: Secondary | ICD-10-CM

## 2012-05-25 DIAGNOSIS — Y831 Surgical operation with implant of artificial internal device as the cause of abnormal reaction of the patient, or of later complication, without mention of misadventure at the time of the procedure: Secondary | ICD-10-CM | POA: Insufficient documentation

## 2012-05-25 DIAGNOSIS — E785 Hyperlipidemia, unspecified: Secondary | ICD-10-CM | POA: Diagnosis present

## 2012-05-25 DIAGNOSIS — I10 Essential (primary) hypertension: Secondary | ICD-10-CM

## 2012-05-25 DIAGNOSIS — T82120A Displacement of cardiac electrode, initial encounter: Secondary | ICD-10-CM

## 2012-05-25 HISTORY — PX: LEAD REVISION: SHX5945

## 2012-05-25 HISTORY — PX: INSERT / REPLACE / REMOVE PACEMAKER: SUR710

## 2012-05-25 HISTORY — DX: Sick sinus syndrome: I49.5

## 2012-05-25 HISTORY — DX: Personal history of other medical treatment: Z92.89

## 2012-05-25 HISTORY — DX: Atherosclerotic heart disease of native coronary artery without angina pectoris: I25.10

## 2012-05-25 HISTORY — DX: Malignant neoplasm of prostate: C61

## 2012-05-25 HISTORY — DX: Migraine, unspecified, not intractable, without status migrainosus: G43.909

## 2012-05-25 HISTORY — DX: Malignant (primary) neoplasm, unspecified: C80.1

## 2012-05-25 LAB — SURGICAL PCR SCREEN: Staphylococcus aureus: NEGATIVE

## 2012-05-25 SURGERY — LEAD REVISION
Anesthesia: LOCAL

## 2012-05-25 MED ORDER — PROPRANOLOL HCL 40 MG PO TABS
40.0000 mg | ORAL_TABLET | Freq: Two times a day (BID) | ORAL | Status: DC
  Filled 2012-05-25 (×3): qty 1

## 2012-05-25 MED ORDER — VITAMIN B-12 1000 MCG PO TABS
1000.0000 ug | ORAL_TABLET | Freq: Every day | ORAL | Status: DC
  Administered 2012-05-26: 1000 ug via ORAL
  Filled 2012-05-25: qty 1

## 2012-05-25 MED ORDER — MIDAZOLAM HCL 5 MG/5ML IJ SOLN
INTRAMUSCULAR | Status: AC
  Filled 2012-05-25: qty 5

## 2012-05-25 MED ORDER — SODIUM CHLORIDE 0.9 % IV SOLN
250.0000 mL | INTRAVENOUS | Status: DC
  Administered 2012-05-25: 15:00:00 via INTRAVENOUS

## 2012-05-25 MED ORDER — FENTANYL CITRATE 0.05 MG/ML IJ SOLN
INTRAMUSCULAR | Status: AC
  Filled 2012-05-25: qty 2

## 2012-05-25 MED ORDER — SODIUM CHLORIDE 0.9 % IJ SOLN: 3.0000 mL | Freq: Two times a day (BID) | INTRAMUSCULAR | Status: DC

## 2012-05-25 MED ORDER — ASPIRIN EC 81 MG PO TBEC
81.0000 mg | DELAYED_RELEASE_TABLET | Freq: Every day | ORAL | Status: DC
  Administered 2012-05-26: 81 mg via ORAL
  Filled 2012-05-25: qty 1

## 2012-05-25 MED ORDER — VITAMIN D3 25 MCG (1000 UNIT) PO TABS
1000.0000 [IU] | ORAL_TABLET | Freq: Every day | ORAL | Status: DC
  Administered 2012-05-26: 1000 [IU] via ORAL
  Filled 2012-05-25: qty 1

## 2012-05-25 MED ORDER — CHLORHEXIDINE GLUCONATE 4 % EX LIQD: 60.0000 mL | Freq: Once | CUTANEOUS | Status: DC

## 2012-05-25 MED ORDER — MUPIROCIN 2 % EX OINT
TOPICAL_OINTMENT | CUTANEOUS | Status: AC
  Administered 2012-05-25: 1
  Filled 2012-05-25: qty 22

## 2012-05-25 MED ORDER — MUPIROCIN 2 % EX OINT
TOPICAL_OINTMENT | Freq: Two times a day (BID) | CUTANEOUS | Status: DC
  Filled 2012-05-25: qty 22

## 2012-05-25 MED ORDER — OXYCODONE-ACETAMINOPHEN 5-325 MG PO TABS
1.0000 | ORAL_TABLET | Freq: Four times a day (QID) | ORAL | Status: DC | PRN
  Administered 2012-05-25 – 2012-05-26 (×2): 1 via ORAL
  Filled 2012-05-25 (×2): qty 1

## 2012-05-25 MED ORDER — LIDOCAINE HCL (PF) 1 % IJ SOLN
INTRAMUSCULAR | Status: AC
  Filled 2012-05-25: qty 60

## 2012-05-25 MED ORDER — DOFETILIDE 250 MCG PO CAPS
250.0000 ug | ORAL_CAPSULE | Freq: Two times a day (BID) | ORAL | Status: DC
  Administered 2012-05-25 – 2012-05-26 (×2): 250 ug via ORAL
  Filled 2012-05-25 (×3): qty 1

## 2012-05-25 MED ORDER — NIFEDIPINE ER 60 MG PO TB24
60.0000 mg | ORAL_TABLET | Freq: Every day | ORAL | Status: DC
  Administered 2012-05-26: 60 mg via ORAL
  Filled 2012-05-25: qty 1

## 2012-05-25 MED ORDER — SODIUM CHLORIDE 0.45 % IV SOLN
INTRAVENOUS | Status: DC
  Administered 2012-05-25: 15:00:00 via INTRAVENOUS

## 2012-05-25 MED ORDER — GENTAMICIN SULFATE 40 MG/ML IJ SOLN
80.0000 mg | INTRAMUSCULAR | Status: DC
  Filled 2012-05-25: qty 2

## 2012-05-25 MED ORDER — VANCOMYCIN HCL IN DEXTROSE 1-5 GM/200ML-% IV SOLN: 1000.0000 mg | INTRAVENOUS | Status: DC

## 2012-05-25 MED ORDER — ACETAMINOPHEN 325 MG PO TABS: 325.0000 mg | ORAL_TABLET | ORAL | Status: DC | PRN

## 2012-05-25 MED ORDER — VANCOMYCIN HCL 1000 MG IV SOLR
1500.0000 mg | Freq: Two times a day (BID) | INTRAVENOUS | Status: AC
  Administered 2012-05-26: 1500 mg via INTRAVENOUS
  Filled 2012-05-25: qty 1500

## 2012-05-25 MED ORDER — PROPRANOLOL HCL 10 MG PO TABS
10.0000 mg | ORAL_TABLET | Freq: Once | ORAL | Status: AC
  Administered 2012-05-25: 23:00:00 10 mg via ORAL
  Filled 2012-05-25: qty 1

## 2012-05-25 MED ORDER — SODIUM CHLORIDE 0.9 % IJ SOLN: 3.0000 mL | INTRAMUSCULAR | Status: DC | PRN

## 2012-05-25 MED ORDER — ACYCLOVIR 200 MG PO CAPS
200.0000 mg | ORAL_CAPSULE | Freq: Two times a day (BID) | ORAL | Status: DC
  Filled 2012-05-25 (×3): qty 1

## 2012-05-25 MED ORDER — ONDANSETRON HCL 4 MG/2ML IJ SOLN: 4.0000 mg | Freq: Four times a day (QID) | INTRAMUSCULAR | Status: DC | PRN

## 2012-05-25 NOTE — H&P (Signed)
[name] is an 66 y.o. male.   Chief Complaint: " I am here for PPM lead revision" HPI: 66 yo man with a h/o recurrent atrial arrhythmias who has undergone 4 catheter ablations and on the last developed heart block. He underwent PPM insertion and has developed an atrial lead dislodgement and now prevents for atrial lead revision.  Past Medical History  Diagnosis Date  . Hyperlipidemia   . Pulsatile tinnitus   . 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  . Atrial flutter -atypical 2007    ABLATION x2 [facility]  . Familial tremor   . Atrial tachycardia 2007    ANTEROSEPTAL ABLATION [facility]  . Hypertension   . First degree AV block   . Sinus pause assoc with blocked pwave   . Benign paroxysmal vertigo   . Abnormal stress test     3/13    Past Surgical History  Procedure Date  . Prostatectomy 02/20/2009    COMPLICATED  . Hernia repair     left side  . Umbilical hernia repair   . Shoulder surgery     Bilateral  . Atrial ablation surgery   . Tonsillectomy and adenoidectomy   . Colonoscopy   . Cardioversion 12/03/2011    Procedure: CARDIOVERSION;  Surgeon: [facility] Salvia, MD;  Location: [facility] OR;  Service: Cardiovascular;  Laterality: [name];  . Cardioversion 01/13/2012    Procedure: CARDIOVERSION;  Surgeon: Lewayne Bunting, MD;  Location: [facility] OR;  Service: Cardiovascular;  Laterality: [name];  . Cardioversion 04/27/2012    Procedure: CARDIOVERSION;  Surgeon: Cassell Clement, MD;  Location: [facility] OR;  Service: Cardiovascular;  Laterality: [name];    Family History  Problem Relation Age of Onset  . Colon cancer Sister    Social History:  reports that he has never smoked. He has never used smokeless tobacco. He reports that he drinks about 1.5 ounces of alcohol per week. He reports that he does not use illicit drugs.  Allergies:  Allergies  Allergen Reactions  . Ace Inhibitors Other (See Comments)    Does not take due to incontinence   . Ceclor (Cefaclor) Hives  .  Statins Other (See Comments)    Muscle weakness    Medications Prior to Admission  Medication Sig Dispense Refill  . acyclovir (ZOVIRAX) 200 MG capsule Take 200 mg by mouth as needed. For outbreak      . aspirin EC 81 MG tablet Take 81 mg by mouth daily.      . cholecalciferol (VITAMIN D) 1000 UNITS tablet Take 1,000 Units by mouth daily.      Marland Kitchen dofetilide (TIKOSYN) 250 MCG capsule Take 250 mcg by mouth 2 (two) times daily.      Marland Kitchen NIFEdipine (PROCARDIA XL/ADALAT-CC) 30 MG 24 hr tablet Take 2 tablets (60 mg total) by mouth daily.  60 tablet  6  . propranolol (INDERAL) 10 MG tablet Take one tablet by mouth three times daily as needed  90 tablet  2  . vitamin B-12 (CYANOCOBALAMIN) 1000 MCG tablet Take 1,000 mcg by mouth daily.        No results found for this or any previous visit (from the past 48 hour(s)). No results found.    See PACU blood pressures Well appearing NAD HEENT: Unremarkable Neck:  No JVD, no thyromegally Lymphatics:  No adenopathy Back:  No CVA tenderness Lungs:  Clear HEART:  Regular rate rhythm, no murmurs, no rubs, no clicks Abd:  Flat, positive bowel sounds, no  organomegally, no rebound, no guarding Ext:  2 plus pulses, no edema, no cyanosis, no clubbing Skin:  No rashes no nodules Neuro:  CN II through XII intact, motor grossly intact  Assessment/Plan 1. Atrial lead dislodgement - I have discussed the risks/benefits/goals/expectations of PPM lead revision with the patient and he wishes to proceed. These include but are not limited to perforation, pneumothorax, infection and bleeding.  Lewayne Bunting, M.D.  Lewayne Bunting 05/25/2012, 5:36 PM

## 2012-05-25 NOTE — Op Note (Signed)
DDD PPM lead revision carried out without immediate complication. R#[id number].

## 2012-05-26 ENCOUNTER — Ambulatory Visit (HOSPITAL_COMMUNITY): Payer: Medicare Other

## 2012-05-26 ENCOUNTER — Encounter (HOSPITAL_COMMUNITY): Payer: Self-pay | Admitting: Nurse Practitioner

## 2012-05-26 DIAGNOSIS — I2510 Atherosclerotic heart disease of native coronary artery without angina pectoris: Secondary | ICD-10-CM

## 2012-05-26 DIAGNOSIS — T82120A Displacement of cardiac electrode, initial encounter: Secondary | ICD-10-CM

## 2012-05-26 DIAGNOSIS — I4892 Unspecified atrial flutter: Secondary | ICD-10-CM

## 2012-05-26 MED ORDER — YOU HAVE A PACEMAKER BOOK
Freq: Once | Status: AC
  Administered 2012-05-26: 12:00:00
  Filled 2012-05-26: qty 1

## 2012-05-26 NOTE — Discharge Summary (Signed)
Patient ID: [name],  MRN: [id number], DOB/AGE: 08-06-1946 66 y.o.  Admit date: 05/25/2012 Discharge date: 05/26/2012  Primary Care Provider: Darnelle Bos Primary Cardiologist: Odessa Fleming, MD  Discharge Diagnoses Principal Problem:  *Atrial pacemaker lead displacement  **s/p revision and placement of new atrial lead this admission.  Active Problems: Symptomatic tachy-brady syndrome  Hyperlipidemia  Atypical Atrial flutter  **s/p RFCA x 2 @ [facility]   Atrial tachycardia  **s/p RFCA @ [facility]  Hypertension  CAD (coronary artery disease)  **known LCX/OM dzs pending PCI  Allergies Allergies  Allergen Reactions  . Ceclor (Cefaclor) Hives  . Statins Other (See Comments)    Muscle weakness  . Ace Inhibitors Other (See Comments)    Does not take due to "angiotensin is responsible for maintaining bladder tone"    Procedures  Right Atrial Lead revision with placement of a new Medtronic 52-cm 5086 MRI compatible active fixation pacing lead, serial number [id number]  History of Present Illness  66 y/o male with the above problem list.  He has a h/o atrial arrhythmias and earlier this year underwent RFCA @ [facility] complicated by heart block requiring PPM placement. Unfortunately, pt has continued to have intermittent atrial arrhythmias and required repeat DCCV in mid June, which was unsuccessful.  He was then discovered to have undersensing on his RA lead with the finding that it had dislodged.  Decision was made to pursue RA lead revision however in the setting of abnormal myoview earlier this year it was felt prudent to pursue cath first.  This revealed complex left circumflex and obtuse marginal disease.  He is pending PCI next week.    Hospital Course  Pt presented to the [facility] EP lab on 7/10 and underwent successful RA lead revision with placement of a new lead.  He tolerated procedure well and post-procedure cxr shows stable lead placement and no evidence of pneumothorax.  He is  stable for d/c today.  We have arranged for f/u in our office with Dr. Riley Kill on 7/15 with plan for PCI later next week.  Of note, pt has been on xarelto related to his atrial arrhythmias however this has been on hold since prior to his catheterization on 7/8.  Discharge Vitals Blood pressure 126/82, pulse 61, temperature 97.5 F (36.4 C), temperature source Oral, resp. rate 15, height 6' (1.829 m), weight 187 lb 13.3 oz (85.2 kg), SpO2 98.00%.  Filed Weights   05/26/12 0820  Weight: 187 lb 13.3 oz (85.2 kg)   Labs  None  Disposition  Pt is being discharged home today in good condition.  Follow-up Plans & Appointments  Follow-up Information    Follow up with Sherryl Manges, MD on 07/15/2012. (3:30)    Contact information:   [address] [place] [address] [phone]       Follow up with [name], MD on 05/30/2012. (9:15)    Contact information:   [address] [place] [address] [phone]       Follow up with [facility] on 06/06/2012. (10 am)    Contact information:   [phone]       Discharge Medications  Medication List  As of 05/26/2012  6:33 PM   TAKE these medications         acyclovir 200 MG capsule   Commonly known as: ZOVIRAX   Take 200 mg by mouth as needed. For outbreak      aspirin  EC 81 MG tablet   Take 81 mg by mouth daily.      cholecalciferol 1000 UNITS tablet   Commonly known as: VITAMIN D   Take 1,000 Units by mouth daily.      dofetilide 250 MCG capsule   Commonly known as: TIKOSYN   Take 250 mcg by mouth 2 (two) times daily.      NIFEdipine 30 MG 24 hr tablet   Commonly known as: PROCARDIA-XL/ADALAT-CC/NIFEDICAL-XL   Take 2 tablets (60 mg total) by mouth daily.      propranolol 10 MG tablet   Commonly known as: INDERAL   Take one tablet by mouth three times daily as needed      vitamin B-12 1000 MCG tablet   Commonly known as:  CYANOCOBALAMIN   Take 1,000 mcg by mouth daily.          Outstanding Labs/Studies  None  Duration of Discharge Encounter   Greater than 30 minutes including physician time.  Signed, Nicolasa Ducking NP 05/26/2012, 6:33 PM

## 2012-05-26 NOTE — Op Note (Signed)
[name], Walter NO.:  [id number]  MEDICAL RECORD NO.:  [id number]  LOCATION:  6525                         FACILITY:  [facility]  PHYSICIAN:  Doylene Canning. Walter Ridgel, MD    DATE OF BIRTH:  02-06-1946  DATE OF PROCEDURE:  05/25/2012 DATE OF DISCHARGE:                              OPERATIVE REPORT   PROCEDURE PERFORMED:  Removal of a previously implanted atrial pacing lead, which had dislodged and was sitting on the tricuspid valve anulus followed by insertion of a new atrial pacing lead with pacemaker pocket revision.  INTRODUCTION:  The patient is a 66 year old man who has a history of recurrent atrial arrhythmias including atrial fibrillation and atrial tachycardia.  He has undergone four catheter ablation procedures at outside institution.  The patient developed high-grade heart block after his most recent procedure and underwent permanent pacemaker insertion at the [facility].  Postoperatively, he was stable.  However, he developed recurrent atrial arrhythmias and was subsequently found to have recurrent atrial fibrillation.  Consideration for initiation of flecainide therapy was carried out, but a stress test was obtained beforehand, which was positive and the catheterization demonstrated that he had very high-grade left circumflex disease.  In addition, he was found to have dislodgement of the atrial lead and is now referred for atrial lead revision.  PROCEDURE:  After informed consent was obtained, the patient was taken to the Diagnostic EP Lab in the fasting state.  After usual preparation and draping, intravenous fentanyl and midazolam was given for sedation. A 30 mL of lidocaine was infiltrated into the left infraclavicular region.  A 5-cm incision was carried out near the old pacemaker incision and electrocautery was utilized to dissect down to the fascial plane. Care was taken initially not to enter the pacemaker pocket.  The  left subclavian vein was then punctured and the Medtronic 52-cm 5086 MRI compatible active fixation pacing lead, serial number [id number] was advanced into the right atrium.  Mapping was carried out at the anterolateral portion of the right atrium.  P-waves measured 5 millivolts and with the lead actively fixed, the pacing threshold was a volt at 0.5 milliseconds.  The 10-volt pacing did not stimulate the diaphragm, and there was a modest injury current present with active fixation of the lead.  The pacing impedance was 600 ohms and 10-volt pacing did not stimulate the diaphragm.  With these satisfactory parameters, the lead was secured to the subpectoral fascia with a figure- of-eight silk suture.  Sewing sleeve was secured with silk suture.  At this point, electrocautery was utilized to enter the pacemaker pocket. The generator was removed with gentle traction.  Evaluation of the atrial and ventricular leads demonstrated very marked fibrous scar tissue present.  At least, half of the lead was scarred down.  At this point, electrocautery was carefully utilized to free up the dense fibrous adhesions.  The atrial lead was then removed after the active fixation mechanism was retracted back.  There were no hemodynamic consequences with this.  The new atrial lead was connected to the old pacemaker and ventricular pacing leads were placed back into the subcutaneous pocket.  The pocket  was irrigated with antibiotic irrigation, and electrocautery was utilized to assure hemostasis and the incision was closed with 2-0 and 3-0 Vicryl.  Benzoin and Steri-Strips were painted on the skin, pressure dressing was applied, and the patient was returned to his room in satisfactory condition.  COMPLICATIONS:  There were no immediate procedure complications.  RESULTS:  This demonstrates successful atrial lead revision in the patient with symptomatic tachybrady syndrome with bradycardia developing following  his most recent catheter ablation procedure.     Doylene Canning. Walter Ridgel, MD     GWT/MEDQ  D:  05/25/2012  T:  05/26/2012  Job:  [id number]  cc:   [facility] Salvia, MD, [facility]

## 2012-05-26 NOTE — Progress Notes (Signed)
Patient ID: [name], male   DOB: Sep 10, 1946, 66 y.o.   MRN: [id number] Subjective:  No chest pain. Minimal incisional pain.  Objective:  Vital Signs in the last 24 hours: Temp:  [97.5 F (36.4 C)-98.2 F (36.8 C)] 97.5 F (36.4 C) (07/11 0820) Pulse Rate:  [60-69] 61  (07/11 0820) Resp:  [11-19] 15  (07/11 0820) BP: (96-127)/(62-85) 126/82 mmHg (07/11 0820) SpO2:  [96 %-98 %] 98 % (07/11 0820) Weight:  [187 lb 13.3 oz (85.2 kg)] 187 lb 13.3 oz (85.2 kg) (07/11 0820)  Intake/Output from previous day: 07/10 0701 - 07/11 0700 In: 1196.7 [I.V.:696.7; IV Piggyback:500] Out: 650 [Urine:650] Intake/Output from this shift:    Physical Exam: Well appearing NAD HEENT: Unremarkable Neck:  No JVD, no thyromegally Lungs:  Clear with no wheezes. No hematoma. HEART:  Regular rate rhythm, no murmurs, no rubs, no clicks Abd:  Flat, positive bowel sounds, no organomegally, no rebound, no guarding Ext:  2 plus pulses, no edema, no cyanosis, no clubbing Skin:  No rashes no nodules Neuro:  CN II through XII intact, motor grossly intact  Lab Results: No results found for this basename: WBC:2,HGB:2,PLT:2 in the last 72 hours No results found for this basename: NA:2,K:2,CL:2,CO2:2,GLUCOSE:2,BUN:2,CREATININE:2 in the last 72 hours No results found for this basename: TROPONINI:2,CK,MB:2 in the last 72 hours Hepatic Function Panel No results found for this basename: PROT,ALBUMIN,AST,ALT,ALKPHOS,BILITOT,BILIDIR,IBILI in the last 72 hours No results found for this basename: CHOL in the last 72 hours No results found for this basename: PROTIME in the last 72 hours  Imaging: Dg Chest 2 View  05/26/2012  *RADIOLOGY REPORT*  Clinical Data: Post pacemaker insertion  CHEST - 2 VIEW  Comparison: Chest x-ray of 12/03/2011  Findings: A dual lead permanent pacemaker is now present.  No pneumothorax is seen.  Mild left basilar atelectasis is noted. Cardiomegaly is stable.  Thoracolumbar scoliosis is noted.   IMPRESSION: Permanent pacemaker now present.  No pneumothorax.  Original Report Authenticated By: Juline Patch, M.D.    Cardiac Studies: Tele - NSR with atrial pacing Assessment/Plan:  1. Atrial lead dislodgement 2. S/p lead revision 3. Atrial tachy/fib Rec: ok to discharge home. His device is working normally and CXR demonstrated no dislodgement.  LOS: 1 day    Buel Ream.D. 05/26/2012, 11:03 AM

## 2012-05-30 ENCOUNTER — Encounter: Payer: Self-pay | Admitting: *Deleted

## 2012-05-30 ENCOUNTER — Encounter (HOSPITAL_COMMUNITY): Payer: Self-pay | Admitting: Pharmacy Technician

## 2012-05-30 ENCOUNTER — Encounter: Payer: Self-pay | Admitting: Cardiology

## 2012-05-30 ENCOUNTER — Ambulatory Visit (INDEPENDENT_AMBULATORY_CARE_PROVIDER_SITE_OTHER): Payer: Medicare Other | Admitting: Cardiology

## 2012-05-30 VITALS — BP 114/72 | HR 70 | Ht 72.0 in | Wt 193.0 lb

## 2012-05-30 DIAGNOSIS — I2510 Atherosclerotic heart disease of native coronary artery without angina pectoris: Secondary | ICD-10-CM

## 2012-05-30 LAB — BASIC METABOLIC PANEL
BUN: 11 mg/dL (ref 6–23)
Calcium: 9.4 mg/dL (ref 8.4–10.5)
Creatinine, Ser: 1 mg/dL (ref 0.4–1.5)
GFR: 78.05 mL/min (ref >60.00)

## 2012-05-30 LAB — CBC WITH DIFFERENTIAL/PLATELET
Basophils Absolute: 0 10*3/uL (ref 0.0–0.1)
Basophils Relative: 0.5 % (ref 0.0–3.0)
Eosinophils Absolute: 0.3 10*3/uL (ref 0.0–0.7)
HCT: 41.7 % (ref 39.0–52.0)
Lymphocytes Relative: 14.6 % (ref 12.0–46.0)
Lymphs Abs: 0.8 10*3/uL (ref 0.7–4.0)
MCHC: 33.5 g/dL (ref 30.0–36.0)
MCV: 91.3 fl (ref 78.0–100.0)
Monocytes Absolute: 0.6 10*3/uL (ref 0.1–1.0)
Neutro Abs: 3.8 10*3/uL (ref 1.4–7.7)
RDW: 13.3 % (ref 11.5–14.6)
WBC: 5.6 10*3/uL (ref 4.5–10.5)

## 2012-05-30 LAB — PROTIME-INR: INR: 1 ratio (ref 0.8–1.0)

## 2012-05-30 MED ORDER — CLOPIDOGREL BISULFATE 75 MG PO TABS: 75.0000 mg | ORAL_TABLET | Freq: Every day | ORAL | Status: DC

## 2012-05-30 NOTE — Telephone Encounter (Signed)
Patient was calling to clarify Plavix instructions given today by Dr Riley Kill.  Verified with Dr Riley Kill and patient will get Plavix 75 mg 2 tablets until stent as discussed at office visit

## 2012-05-30 NOTE — Progress Notes (Signed)
HPI:  The patient comes in today for a followup visit, in preparation for possible percutaneous coronary intervention. He's undergone multiple ablation procedures for atrial fibrillation in the past. More recently, he has had recurrence of atrial fibrillation after her recent ablation procedure. He also had a permanent pacemaker placed for sinus node dysfunction. Because of a goal of using Flecainide, he underwent nuclear imaging, and then last week cardiac cath procedure.  He had anterior ischemia, and notably, at cath had a high grade bifurcation stenosis noted at the OM, AV circ interface.  The AV Circ is 95% obstructed and appears as though it would be hard to enter.  It supplies a PL area.  The large OM has 80-90% daylight focal narrowing leading into a large area.  I reviewed the films with Dr. Graciela Husbands, then with Drs. Elyn Peers, and [place].  The patient had been purportedly asymtpomatic, but has had intermittent chest pan.  Most recently, with emotional stress, he felt it again.  However, he has been able to walk.  The territory supplied by this is quite large.  The consensus, shared with and by the patient, has been to avoid CABG, and also consider an attempt at complex intervention.  The patient at cath also had noted a lead dislodgement, and on Thursday Dr. [name] did a lead extraction, replacement.  He has done well since, and the site looks good. We brought him back today in preparation.  He and I, along with his wife, reviewed the films in the room, and I reviewed with him the interventional strategy, possible outcomes, and planned approach.  We agreed to put this off until Thursday to minimized the risk of pocket hematoma, especially since he has been stable.  He will plavix load during that period.  He agreed he would be uncomfortable with a simple medical approach to his lesions, given the size, presence of ischemia, etc.  I have reviewed with him elements of the [facility] data.   Current  Outpatient Prescriptions  Medication Sig Dispense Refill  . acyclovir (ZOVIRAX) 200 MG capsule Take 200 mg by mouth as needed. For outbreak      . aspirin EC 81 MG tablet Take 81 mg by mouth daily.      . cholecalciferol (VITAMIN D) 1000 UNITS tablet Take 1,000 Units by mouth daily.      Marland Kitchen dofetilide (TIKOSYN) 250 MCG capsule Take 250 mcg by mouth 2 (two) times daily.      Marland Kitchen NIFEdipine (PROCARDIA XL/ADALAT-CC) 30 MG 24 hr tablet Take 2 tablets (60 mg total) by mouth daily.  60 tablet  6  . propranolol (INDERAL) 10 MG tablet Take one tablet by mouth three times daily as needed  90 tablet  2  . vitamin B-12 (CYANOCOBALAMIN) 1000 MCG tablet Take 1,000 mcg by mouth daily.      . clopidogrel (PLAVIX) 75 MG tablet Take 1 tablet (75 mg total) by mouth daily.  30 tablet  6    Allergies  Allergen Reactions  . Ceclor (Cefaclor) Hives  . Statins Other (See Comments)    Muscle weakness  . Ace Inhibitors Other (See Comments)    Does not take due to "angiotensin is responsible for maintaining bladder tone"  . Chlorhexidine Itching and Rash    Past Medical History  Diagnosis Date  . Hyperlipidemia   . Pulsatile tinnitus   . 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  . Familial tremor   . Hypertension   .  Benign paroxysmal vertigo   . Heart murmur   . Atrial flutter -atypical 2007    ABLATION x2 [facility]  . Atrial tachycardia 2007    ABLATION [facility]  . First degree AV block   . Tachy-brady syndrome     a. developed after RFCA @ [facility] 2013-> MDT PPM;  b. s/p RA lead revision 05/2012  . History of blood transfusion 2010    "2nd day after prostatectomy"  . Migraines   . Prostate cancer   . Carcinoma     "off my chest"  . CAD (coronary artery disease)     a. abnl mv 01/2012;  b. cath 05/23/2012 LCX 80@ bifurcation w/ OM, 95% @ AV groove, otw nonobs dzs, EF 55-65%;    Past Surgical History  Procedure Date  . Prostatectomy 02/20/2009    COMPLICATED  . Shoulder surgery      Bilateral; "boney spurs removed"  . Atrial ablation surgery 04/01/12    "4th time I had one"  . Colonoscopy   . Cardioversion 12/03/2011    Procedure: CARDIOVERSION;  Surgeon: Duke Salvia, MD;  Location: [facility] OR;  Service: Cardiovascular;  Laterality: [name];  . Cardioversion 01/13/2012    Procedure: CARDIOVERSION;  Surgeon: Lewayne Bunting, MD;  Location: [facility] OR;  Service: Cardiovascular;  Laterality: [name];  . Cardioversion 04/27/2012    Procedure: CARDIOVERSION;  Surgeon: Cassell Clement, MD;  Location: [facility] OR;  Service: Cardiovascular;  Laterality: [name];  . Insert / replace / remove pacemaker 03/2012    initial placement  . Insert / replace / remove pacemaker 05/25/12    "atrial lead change"  . Inguinal hernia repair 1945    left side  . Hernia repair 1980's  . Tonsillectomy and adenoidectomy     "as a child"  . Cardiac catheterization   . Skin cancer excision     "in situ; right chest"    Family History  Problem Relation Age of Onset  . Colon cancer Sister     History   Social History  . Marital Status: Married    Spouse Name: [name]    Number of Children: [name]  . Years of Education: [name]   Occupational History  . Not on file.   Social History Main Topics  . Smoking status: Never Smoker   . Smokeless tobacco: Never Used  . Alcohol Use: 1.5 oz/week    3 drink(s) per week     05/25/12 "last alcohol 02/2012"  . Drug Use: No  . Sexually Active: Not Currently   Other Topics Concern  . Not on file   Social History Narrative  . No narrative on file    ROS: Please see the HPI.  All other systems reviewed and negative.  PHYSICAL EXAM:  BP 114/72  Pulse 70  Ht 6' (1.829 m)  Wt 193 lb (87.544 kg)  BMI 26.18 kg/m2  General: Well developed, well nourished, in no acute distress. Head:  Normocephalic and atraumatic. Neck: no JVD. Pacer site looks good.    EKG:  ASSESSMENT AND PLAN:  This is somewhat complex situation. The patient clearly does not have class III  symptoms, although he has had some difficulty in separating out some symptoms that he has had recently. He does have an abnormal nuclear study demonstrating ischemia anteriorly, most likely in the distribution of the circumflex vessel. Complex bifurcational stenosis is a grey-white lesion leading into a very large myocardial territory. The patient does need to be on anti-coagulant therapy going forward because  of his multiple ablations, and we discussed the strategy of bare-metal versus drug-eluting. More than likely, we will lean towards a drug-eluting stent. He's been loaded with aspirin and Plavix. I reviewed extensively the films with the patient and his wife, we talked about the various strategies. I reviewed the films with my colleagues as well.  The consensus opinion has been to attempt percutaneous intervention with the understanding that there is a slight increased risk. However, the entire territory is really quite large and the area ischemic.  As such, the patient and his wife wish to proceed with the full understanding of the nature of the procedure, and options available.  He will be loaded with clopidogrel over the next three days, then we will plan a femoral based procedure on Thursday, likely with a 54F Guiding catheter.  He is agreeable.  TS

## 2012-05-30 NOTE — Telephone Encounter (Signed)
New problem:  Has a question regarding dosage of rx that was given.

## 2012-05-30 NOTE — Patient Instructions (Addendum)
Your physician has requested that you have a cardiac catheterization. Cardiac catheterization is used to diagnose and/or treat various heart conditions. Doctors may recommend this procedure for a number of different reasons. The most common reason is to evaluate chest pain. Chest pain can be a symptom of coronary artery disease (CAD), and cardiac catheterization can show whether plaque is narrowing or blocking your heart's arteries. This procedure is also used to evaluate the valves, as well as measure the blood flow and oxygen levels in different parts of your heart. For further information please visit [email or web address] Please follow instruction sheet, as given. Scheduled for July 18,2013   Your physician has recommended you make the following change in your medication: Start Plavix 75 mg by mouth daily.

## 2012-05-31 ENCOUNTER — Encounter: Payer: Medicare Other | Admitting: Internal Medicine

## 2012-06-01 ENCOUNTER — Telehealth: Payer: Self-pay | Admitting: Cardiology

## 2012-06-01 NOTE — Telephone Encounter (Signed)
Spoke with pt who states he took 2 tablets Plavix on Monday, Tuesday and Wednesday. He is asking how many he should take tomorrow prior to procedure. I reviewed with Weston Brass, PharmD and pt should take 2 tablets on Thursday to total 600 mg and to let cath lab staff know amount taken. I spoke with pt and gave him this information.

## 2012-06-01 NOTE — Telephone Encounter (Signed)
Please return call to patient at [phone], he has a question about his medication before surgery.

## 2012-06-02 ENCOUNTER — Encounter (HOSPITAL_COMMUNITY)
Admission: RE | Disposition: A | Discharge status: Home / Self Care | Payer: Self-pay | Source: Ambulatory Visit | Attending: Cardiology

## 2012-06-02 ENCOUNTER — Encounter (HOSPITAL_COMMUNITY): Payer: Self-pay | Admitting: General Practice

## 2012-06-02 ENCOUNTER — Ambulatory Visit (HOSPITAL_COMMUNITY)
Admission: RE | Admit: 2012-06-02 | Discharge: 2012-06-03 | Disposition: A | Discharge status: Home / Self Care | Payer: Medicare Other | Source: Ambulatory Visit | Attending: Cardiology | Admitting: Cardiology

## 2012-06-02 DIAGNOSIS — Z955 Presence of coronary angioplasty implant and graft: Secondary | ICD-10-CM

## 2012-06-02 DIAGNOSIS — T82120A Displacement of cardiac electrode, initial encounter: Secondary | ICD-10-CM

## 2012-06-02 DIAGNOSIS — I10 Essential (primary) hypertension: Secondary | ICD-10-CM | POA: Insufficient documentation

## 2012-06-02 DIAGNOSIS — I4891 Unspecified atrial fibrillation: Secondary | ICD-10-CM

## 2012-06-02 DIAGNOSIS — E785 Hyperlipidemia, unspecified: Secondary | ICD-10-CM

## 2012-06-02 DIAGNOSIS — Z950 Presence of cardiac pacemaker: Secondary | ICD-10-CM | POA: Insufficient documentation

## 2012-06-02 DIAGNOSIS — I2510 Atherosclerotic heart disease of native coronary artery without angina pectoris: Secondary | ICD-10-CM

## 2012-06-02 DIAGNOSIS — I4892 Unspecified atrial flutter: Secondary | ICD-10-CM

## 2012-06-02 HISTORY — PX: PERCUTANEOUS CORONARY STENT INTERVENTION (PCI-S): SHX5485

## 2012-06-02 HISTORY — PX: CORONARY ANGIOPLASTY WITH STENT PLACEMENT: SHX49

## 2012-06-02 LAB — POCT ACTIVATED CLOTTING TIME: Activated Clotting Time: 334 seconds

## 2012-06-02 SURGERY — PERCUTANEOUS CORONARY STENT INTERVENTION (PCI-S)
Anesthesia: LOCAL

## 2012-06-02 MED ORDER — SODIUM CHLORIDE 0.9 % IV SOLN
1.7500 mg/kg/h | INTRAVENOUS | Status: DC
  Filled 2012-06-02: qty 250

## 2012-06-02 MED ORDER — SODIUM CHLORIDE 0.9 % IV SOLN: INTRAVENOUS | Status: AC

## 2012-06-02 MED ORDER — CLOPIDOGREL BISULFATE 75 MG PO TABS
75.0000 mg | ORAL_TABLET | Freq: Every day | ORAL | Status: DC
  Filled 2012-06-02: qty 1

## 2012-06-02 MED ORDER — ONDANSETRON HCL 4 MG/2ML IJ SOLN: 4.0000 mg | Freq: Four times a day (QID) | INTRAMUSCULAR | Status: DC | PRN

## 2012-06-02 MED ORDER — BIVALIRUDIN 250 MG IV SOLR
INTRAVENOUS | Status: AC
  Filled 2012-06-02: qty 250

## 2012-06-02 MED ORDER — SODIUM CHLORIDE 0.9 % IJ SOLN: 3.0000 mL | Freq: Two times a day (BID) | INTRAMUSCULAR | Status: DC

## 2012-06-02 MED ORDER — HEPARIN (PORCINE) IN NACL 2-0.9 UNIT/ML-% IJ SOLN
INTRAMUSCULAR | Status: AC
  Filled 2012-06-02: qty 2000

## 2012-06-02 MED ORDER — ASPIRIN EC 81 MG PO TBEC
81.0000 mg | DELAYED_RELEASE_TABLET | Freq: Every day | ORAL | Status: DC
  Filled 2012-06-02: qty 1

## 2012-06-02 MED ORDER — LIDOCAINE HCL (PF) 1 % IJ SOLN
INTRAMUSCULAR | Status: AC
  Filled 2012-06-02: qty 30

## 2012-06-02 MED ORDER — ASPIRIN 81 MG PO CHEW
CHEWABLE_TABLET | ORAL | Status: AC
  Filled 2012-06-02: qty 4

## 2012-06-02 MED ORDER — VITAMIN D3 25 MCG (1000 UNIT) PO TABS
1000.0000 [IU] | ORAL_TABLET | Freq: Every day | ORAL | Status: DC
Start: 2012-06-03 — End: 2012-06-03
  Filled 2012-06-02: qty 1

## 2012-06-02 MED ORDER — SODIUM CHLORIDE 0.9 % IV SOLN: INTRAVENOUS | Status: DC

## 2012-06-02 MED ORDER — ASPIRIN 81 MG PO CHEW
324.0000 mg | CHEWABLE_TABLET | ORAL | Status: AC
  Administered 2012-06-02: 324 mg via ORAL

## 2012-06-02 MED ORDER — NITROGLYCERIN 0.2 MG/ML ON CALL CATH LAB
INTRAVENOUS | Status: AC
  Filled 2012-06-02: qty 1

## 2012-06-02 MED ORDER — SODIUM CHLORIDE 0.9 % IJ SOLN: 3.0000 mL | INTRAMUSCULAR | Status: DC | PRN

## 2012-06-02 MED ORDER — MIDAZOLAM HCL 2 MG/2ML IJ SOLN
INTRAMUSCULAR | Status: AC
  Filled 2012-06-02: qty 2

## 2012-06-02 MED ORDER — VITAMIN B-12 1000 MCG PO TABS
1000.0000 ug | ORAL_TABLET | Freq: Every day | ORAL | Status: DC
  Filled 2012-06-02: qty 1

## 2012-06-02 MED ORDER — ACETAMINOPHEN 325 MG PO TABS: 650.0000 mg | ORAL_TABLET | ORAL | Status: DC | PRN

## 2012-06-02 MED ORDER — FENTANYL CITRATE 0.05 MG/ML IJ SOLN
INTRAMUSCULAR | Status: AC
  Filled 2012-06-02: qty 2

## 2012-06-02 MED ORDER — SODIUM CHLORIDE 0.9 % IV SOLN: 250.0000 mL | INTRAVENOUS | Status: DC | PRN

## 2012-06-02 MED ORDER — ACYCLOVIR 200 MG PO CAPS
200.0000 mg | ORAL_CAPSULE | Freq: Every day | ORAL | Status: DC | PRN
  Filled 2012-06-02: qty 1

## 2012-06-02 MED ORDER — ASPIRIN 81 MG PO CHEW: 324.0000 mg | CHEWABLE_TABLET | ORAL | Status: DC

## 2012-06-02 NOTE — CV Procedure (Signed)
   CARDIAC CATH NOTE  Name: [name] MRN: [id number] DOB: 1946/09/03  Procedure: PTCA and stenting of the mid CFX artery  Indication: Please see prior notes.  The patient has had multiple ablation procedures.  More recently, in an attempt to use Flecainide, a nuclear study was done and demonstrated anterior ischemia. Cath was recommended by Dr. Graciela Husbands, and this revealed a high grade bifurcation lesion in a large vascular territory.  He has had some intermittent chest pain, especially with stress, but it has been hard to determine an anginal class.  Multiple providers reviewed the films, and the consensus was to consider PCI given the very large vascular territory.  The patient also favored this after discussion of options of medical therapy or PCI.  The interventional approach, and possible outcomes were laid out to the patient and his wife, and it was his desire to proceed.    Procedural Details: The right groin was prepped, draped, and anesthetized with 1% lidocaine. Using the modified Seldinger technique, a 7 Fr sheath was introduced into the right femoral artery.  We used a 39F approach in order to be prepared for possible bifurcation stenting.    Weight-based bivalirudin was given for anticoagulation. Once a therapeutic ACT was achieved, a 7 [place] CLS 3.5  guide catheter was inserted.  A short [place] coronary guidewire was used to cross the lesion.   We then attempt to cross the side branch, which we knew was difficult, with a [place] wire.  Bends were fashioned on the wire in multiple shapes, but we were unable to cross.   The lesion was predilated with a 2.10mm balloon, and then a 2.5 [place] balloon.  We then again tried to recross with this wire, and then with a sharp bend Luge wire.  We attempt for about 7-10 minutes of fluoro time.  This strategy was then abandoned. There was TIMI 2 flow at this point in the smaller AV branch  with no pain. The lesion was then stented with a 2.75 by 20 Promus  Element stent.  The stent was postdilated with a 2.75, 3.0 and 3.25  noncompliant balloon to higher pressures.  Following PCI, there was 10% residual stenosis  (maybe due to bend at lesion site and TIMI-3 flow. Final angiography confirmed an excellent result.   The AV branch was occluded but appeared to be collateralized from the main OM, and there was no pain.  The patient tolerated the procedure well. There were no immediate procedural complications. Femoral sheath was sutured into place and he was pain free.  I reviewed the films with his wife in detail.  . The patient was transferred to the post catheterization recovery area for further monitoring.  Lesion Data: Vessel: CFX Percent stenosis (pre): 80-90 TIMI-flow (pre):  3 Stent:  Promus Element  (2.75 post dil to 3.25  ---    20mm length Percent stenosis (post): 0-10% TIMI-flow (post): 3 Side branch occlusion with collaterals to the side branch  Conclusions:  1.  Successful PCI with DES stenting of the large OM. 2.  Side branch occlusion with left to left collateral filling 3.  Recurrent atrial arrhythmias requiring multiple ablations and medications   Recommendations:  1.  DAPT for 6 months-12 months, then single APT thereafter in combination with antithrombin.  [name] 06/02/2012, 9:50 AM

## 2012-06-02 NOTE — Progress Notes (Signed)
Patient stable post procedure.  Groin no hematoma.   PRU consistent with responsiveness to clopidogrel. Would consider delaying use of xarelto for longer period.   Plan dc in am

## 2012-06-02 NOTE — Interval H&P Note (Signed)
History and Physical Interval Note:  06/02/2012 9:43 AM  [name]  has presented today for surgery, with the diagnosis of CAD  The various methods of treatment have been discussed with the patient and family. After consideration of risks, benefits and other options for treatment, the patient has consented to  Procedure(s) (LRB): PERCUTANEOUS CORONARY STENT INTERVENTION (PCI-S) ([name]) as a surgical intervention .  The patient's history has been reviewed, patient examined, no change in status, stable for surgery.  I have reviewed the patients' chart and labs.  Questions were answered to the patient's satisfaction.   See my notes.  I met with them in the office and again this am to review the procedure and plans, and possible outcomes.  The patient wanted to proceed.     [name]

## 2012-06-02 NOTE — Progress Notes (Signed)
Site area: right groin  Site Prior to Removal:  Level 0  Pressure Applied For 65 MINUTES    Minutes Beginning at 1149  Manual:   yes  Patient Status During Pull:  stable  Post Pull Groin Site:  Level 0  Post Pull Instructions Given:  yes  Post Pull Pulses Present:  yes  Dressing Applied:  yes  Comments:  Dr. Riley Kill pulled sheath and held pressure for first 20 minutes then continued holding an additional 45 minutes. Patient tolerated well, blood pressure stable and site level 0.  Dr. Riley Kill into see patient at 12 and site assessed.

## 2012-06-02 NOTE — H&P (Signed)
[name], MD 05/30/2012 6:47 PM Signed  HPI: The patient comes in today for a followup visit, in preparation for possible percutaneous coronary intervention. He's undergone multiple ablation procedures for atrial fibrillation in the past. More recently, he has had recurrence of atrial fibrillation after her recent ablation procedure. He also had a permanent pacemaker placed for sinus node dysfunction. Because of a goal of using Flecainide, he underwent nuclear imaging, and then last week cardiac cath procedure. He had anterior ischemia, and notably, at cath had a high grade bifurcation stenosis noted at the OM, AV circ interface. The AV Circ is 95% obstructed and appears as though it would be hard to enter. It supplies a PL area. The large OM has 80-90% daylight focal narrowing leading into a large area. I reviewed the films with Dr. Graciela Husbands, then with Drs. Elyn Peers, and [place]. The patient had been purportedly asymtpomatic, but has had intermittent chest pan. Most recently, with emotional stress, he felt it again. However, he has been able to walk. The territory supplied by this is quite large. The consensus, shared with and by the patient, has been to avoid CABG, and also consider an attempt at complex intervention. The patient at cath also had noted a lead dislodgement, and on Thursday Dr. [name] did a lead extraction, replacement. He has done well since, and the site looks good. We brought him back today in preparation. He and I, along with his wife, reviewed the films in the room, and I reviewed with him the interventional strategy, possible outcomes, and planned approach. We agreed to put this off until Thursday to minimized the risk of pocket hematoma, especially since he has been stable. He will plavix load during that period. He agreed he would be uncomfortable with a simple medical approach to his lesions, given the size, presence of ischemia, etc. I have reviewed with him elements of the  [facility] data.  Current Outpatient Prescriptions   Medication  Sig  Dispense  Refill   .  acyclovir (ZOVIRAX) 200 MG capsule  Take 200 mg by mouth as needed. For outbreak     .  aspirin EC 81 MG tablet  Take 81 mg by mouth daily.     .  cholecalciferol (VITAMIN D) 1000 UNITS tablet  Take 1,000 Units by mouth daily.     Marland Kitchen  dofetilide (TIKOSYN) 250 MCG capsule  Take 250 mcg by mouth 2 (two) times daily.     Marland Kitchen  NIFEdipine (PROCARDIA XL/ADALAT-CC) 30 MG 24 hr tablet  Take 2 tablets (60 mg total) by mouth daily.  60 tablet  6   .  propranolol (INDERAL) 10 MG tablet  Take one tablet by mouth three times daily as needed  90 tablet  2   .  vitamin B-12 (CYANOCOBALAMIN) 1000 MCG tablet  Take 1,000 mcg by mouth daily.     .  clopidogrel (PLAVIX) 75 MG tablet  Take 1 tablet (75 mg total) by mouth daily.  30 tablet  6    Allergies   Allergen  Reactions   .  Ceclor (Cefaclor)  Hives   .  Statins  Other (See Comments)     Muscle weakness   .  Ace Inhibitors  Other (See Comments)     Does not take due to "angiotensin is responsible for maintaining bladder tone"   .  Chlorhexidine  Itching and Rash    Past Medical History   Diagnosis  Date   .  Hyperlipidemia    .  Pulsatile tinnitus    .  Incontinence      DECREASING INCONTINENCE RELATED TO PROSTATECTOMY.   .  Familial tremor    .  Hypertension    .  Benign paroxysmal vertigo    .  Heart murmur    .  Atrial flutter -atypical  2007     ABLATION x2 [facility]   .  Atrial tachycardia  2007     ABLATION [facility]   .  First degree AV block    .  Tachy-brady syndrome      a. developed after RFCA @ [facility] 2013-> MDT PPM; b. s/p RA lead revision 05/2012   .  History of blood transfusion  2010     "2nd day after prostatectomy"   .  Migraines    .  Prostate cancer    .  Carcinoma      "off my chest"   .  CAD (coronary artery disease)      a. abnl mv 01/2012; b. cath 05/23/2012 LCX 80@ bifurcation w/ OM, 95% @ AV groove, otw nonobs dzs, EF 55-65%;    Past  Surgical History   Procedure  Date   .  Prostatectomy  02/20/2009     COMPLICATED   .  Shoulder surgery      Bilateral; "boney spurs removed"   .  Atrial ablation surgery  04/01/12     "4th time I had one"   .  Colonoscopy    .  Cardioversion  12/03/2011     Procedure: CARDIOVERSION; Surgeon: Duke Salvia, MD; Location: [facility] OR; Service: Cardiovascular; Laterality: [name];   .  Cardioversion  01/13/2012     Procedure: CARDIOVERSION; Surgeon: Lewayne Bunting, MD; Location: [facility] OR; Service: Cardiovascular; Laterality: [name];   .  Cardioversion  04/27/2012     Procedure: CARDIOVERSION; Surgeon: Cassell Clement, MD; Location: [facility] OR; Service: Cardiovascular; Laterality: [name];   .  Insert / replace / remove pacemaker  03/2012     initial placement   .  Insert / replace / remove pacemaker  05/25/12     "atrial lead change"   .  Inguinal hernia repair  1945     left side   .  Hernia repair  1980's   .  Tonsillectomy and adenoidectomy      "as a child"   .  Cardiac catheterization    .  Skin cancer excision      "in situ; right chest"    Family History   Problem  Relation  Age of Onset   .  Colon cancer  Sister     History    Social History   .  Marital Status:  Married     Spouse Name:  [name]     Number of Children:  [name]   .  Years of Education:  [name]    Occupational History   .  Not on file.    Social History Main Topics   .  Smoking status:  Never Smoker   .  Smokeless tobacco:  Never Used   .  Alcohol Use:  1.5 oz/week     3 drink(s) per week      05/25/12 "last alcohol 02/2012"   .  Drug Use:  No   .  Sexually Active:  Not Currently    Other Topics  Concern   .  Not on file    Social History Narrative   .  No narrative on file  ROS:  Please see the HPI. All other systems reviewed and negative.  PHYSICAL EXAM:  BP 114/72  Pulse 70  Ht 6' (1.829 m)  Wt 193 lb (87.544 kg)  BMI 26.18 kg/m2  General: Well developed, well nourished, in no acute distress.  Head: Normocephalic  and atraumatic.  Neck: no JVD. Pacer site looks good.  EKG:  ASSESSMENT AND PLAN:  This is somewhat complex situation. The patient clearly does not have class 3 symptoms, although he has had some difficulty in separating out some symptoms that he has had recently. He does have an abnormal nuclear study demonstrating ischemia anteriorly, most likely in the distribution of the circumflex vessel. Complex bifurcational stenosis is a complex lesion leading into a very large myocardial territory. The patient does need to be on anti-coagulant therapy going forward because of his multiple ablations, and we discussed the strategy of bare-metal versus drug-eluting. More than likely, we will lean towards a drug-eluting stent. He's been loaded with aspirin and Plavix. I reviewed extensively the films with the patient and his wife, we talked about the various strategies. I reviewed the films with my colleagues as well. The consensus opinion has been to attempt percutaneous intervention with the understanding that there is a slight increased risk of myocardial infarcion.  The strategy will be an attempt to stent the MV with provisional side branch treatment. However, the entire territory is really quite large and the area ischemic. As such, the patient and his wife wish to proceed with the full understanding of the nature of the procedure, and options available. He will be loaded with clopidogrel over the next three days, then we will plan a femoral based procedure on Thursday, likely with a 57F Guiding catheter. He is agreeable. TS

## 2012-06-03 DIAGNOSIS — I2510 Atherosclerotic heart disease of native coronary artery without angina pectoris: Secondary | ICD-10-CM

## 2012-06-03 LAB — BASIC METABOLIC PANEL
BUN: 8 mg/dL (ref 6–23)
Calcium: 8.9 mg/dL (ref 8.4–10.5)
Chloride: 99 mEq/L (ref 96–112)
Creatinine, Ser: 1.01 mg/dL (ref 0.50–1.35)
GFR calc Af Amer: 86 mL/min — ABNORMAL LOW (ref >90)
GFR calc non Af Amer: 74 mL/min — ABNORMAL LOW (ref >90)

## 2012-06-03 LAB — CBC
MCHC: 34.9 g/dL (ref 30.0–36.0)
MCV: 86.1 fL (ref 78.0–100.0)
Platelets: 176 10*3/uL (ref 150–400)
RDW: 12.6 % (ref 11.5–15.5)
WBC: 6.1 10*3/uL (ref 4.0–10.5)

## 2012-06-03 MED ORDER — NITROGLYCERIN 0.4 MG SL SUBL: 0.4000 mg | SUBLINGUAL_TABLET | SUBLINGUAL | Status: DC | PRN

## 2012-06-03 MED ORDER — CLOPIDOGREL BISULFATE 75 MG PO TABS: 75.0000 mg | ORAL_TABLET | Freq: Every day | ORAL | Status: DC

## 2012-06-03 MED ORDER — DOFETILIDE 250 MCG PO CAPS
250.0000 ug | ORAL_CAPSULE | Freq: Two times a day (BID) | ORAL | Status: DC
  Administered 2012-06-03: 250 ug via ORAL
  Filled 2012-06-03 (×3): qty 1

## 2012-06-03 MED FILL — Dextrose Inj 5%: INTRAVENOUS | Qty: 50 | Status: AC

## 2012-06-03 NOTE — Discharge Summary (Signed)
CARDIOLOGY DISCHARGE SUMMARY   Patient ID: [name] MRN: [id number] DOB/AGE: 1946-06-08 66 y.o.  Admit date: 06/02/2012 Discharge date: 06/03/2012  Primary Discharge Diagnosis:  Crescendo angina, status post Promus Element 2.75 (post dil to 3.25) x 20mm DES             to the circumflex                                                 Secondary Discharge Diagnosis:  Past Medical History  Diagnosis Date  . Hyperlipidemia   . Pulsatile tinnitus   . 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  . Familial tremor   . Hypertension   . Benign paroxysmal vertigo   . Heart murmur   . Atrial flutter -atypical 2007    ABLATION x2 [facility]  . Atrial tachycardia 2007    ABLATION [facility]  . First degree AV block   . Tachy-brady syndrome     a. developed after RFCA @ [facility] 2013-> MDT PPM;  b. s/p RA lead revision 05/2012  . History of blood transfusion 2010    "2nd day after prostatectomy"  . Migraines   . Prostate cancer   . Carcinoma     "off my chest"  . CAD (coronary artery disease)     a. abnl mv 01/2012;  b. 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 He also has a history of atrial fibrillation and has a pacemaker. He required a lead revision and was seen in the office in followup. He was continuing to have chest pain in the chest pain was more frequent. He was felt to be far enough out from his pacemaker procedure that the risk of pocket hematoma was low. He had Plavix added to his medication regimen and came in for percutaneous intervention on 06/02/2012.  He had the procedures described below and tolerated it well. On 06/03/2012, he was seen by Dr. Riley Kill. His post procedure labs were stable. He was seen by cardiac rehabilitation and educated in cardiac risk-factor reduction as well  as increasing his activity. His anti-thrombin therapy was discussed and he is to be on dual antibiotics platelet therapy for one month, then clopidogrel in combo with Xarelto for six months, then ASA with Xarelto after that. He was ambulating without chest pain or shortness of breath and considered stable for discharge, to follow up as scheduled.   Labs:  Lab Results  Component Value Date   WBC 6.1 06/03/2012   HGB 13.2 06/03/2012   HCT 37.8* 06/03/2012   MCV 86.1 06/03/2012   PLT 176 06/03/2012    Lab 06/03/12 0645  NA 134*  K 3.6  CL 99  CO2 26  BUN 8  CREATININE 1.01  CALCIUM 8.9  PROT --  BILITOT --  ALKPHOS --  ALT --  AST --  GLUCOSE 105*    Radiology: Dg Chest 2 View 05/26/2012  *RADIOLOGY REPORT*  Clinical Data: Post pacemaker insertion  CHEST - 2 VIEW  Comparison: Chest x-ray of 12/03/2011  Findings: A dual lead permanent pacemaker is now present.  No pneumothorax is seen.  Mild left  basilar atelectasis is noted. Cardiomegaly is stable.  Thoracolumbar scoliosis is noted.  IMPRESSION: Permanent pacemaker now present.  No pneumothorax.  Original Report Authenticated By: Juline Patch, M.D.    Cardiac Cath: 06/02/2012 Lesion Data:  Vessel: CFX  Percent stenosis (pre): 80-90  TIMI-flow (pre): 3  Stent: Promus Element (2.75 post dil to 3.25 --- 20mm length  Percent stenosis (post): 0-10%  TIMI-flow (post): 3  Side branch occlusion with collaterals to the side branch  Conclusions:  1. Successful PCI with DES stenting of the large OM.  2. Side branch occlusion with left to left collateral filling  3. Recurrent atrial arrhythmias requiring multiple ablations and medications   EKG: 03-Jun-2012 03:31:04  Atrial-paced rhythm with prolonged AV conduction Vent. rate 60 BPM PR interval 376 ms QRS duration 100 ms QT/QTc 430/430 ms P-R-T axes * 26 31  FOLLOW UP PLANS AND APPOINTMENTS Allergies  Allergen Reactions  . Ceclor (Cefaclor) Hives  . Chlorhexidine Itching and Rash    . Statins Other (See Comments)    Muscle weakness  . Ace Inhibitors Other (See Comments)    Does not take due to "angiotensin is responsible for maintaining bladder tone"   Medication List  As of 06/03/2012 10:03 AM   TAKE these medications         acyclovir 200 MG capsule   Commonly known as: ZOVIRAX   Take 200 mg by mouth as needed. For outbreak      aspirin EC 81 MG tablet   Take 81 mg by mouth daily.      cholecalciferol 1000 UNITS tablet   Commonly known as: VITAMIN D   Take 1,000 Units by mouth daily.      clopidogrel 75 MG tablet   Commonly known as: PLAVIX   Take 1 tablet (75 mg total) by mouth daily.      dofetilide 250 MCG capsule   Commonly known as: TIKOSYN   Take 250 mcg by mouth 2 (two) times daily.      NIFEdipine 30 MG 24 hr tablet   Commonly known as: PROCARDIA-XL/ADALAT-CC/NIFEDICAL-XL   Take 2 tablets (60 mg total) by mouth daily.      nitroGLYCERIN 0.4 MG SL tablet   Commonly known as: NITROSTAT   Place 1 tablet (0.4 mg total) under the tongue every 5 (five) minutes as needed for chest pain.      propranolol 10 MG tablet   Commonly known as: INDERAL   Take one tablet by mouth three times daily as needed      vitamin B-12 1000 MCG tablet   Commonly known as: CYANOCOBALAMIN   Take 1,000 mcg by mouth daily.             Discharge Orders    Future Appointments: Provider: Department: Dept Phone: Center:   06/06/2012 10:00 AM [facility] [place] [phone] [facility]   07/15/2012 3:30 PM Duke Salvia, MD [facility] [phone] [facility]     Future Orders Please Complete By Expires   Amb Referral to Cardiac Rehabilitation         Follow-up Information    Follow up with [facility] (Device and wound check, 06/06/2012 at 10 AM)    Contact information:   [address] [place] [address]       Follow up with Sherryl Manges, MD. (07/15/2012 at 3:30 PM)    Contact  information:   [address] [place]  [address] [phone]       Follow up with [name], MD. (Groin check June 06, 2012 at 10:00 am during device check.)    Contact information:   [address] [place] [address] [phone]           BRING ALL MEDICATIONS WITH YOU TO FOLLOW UP APPOINTMENTS  Time spent with patient to include physician time: 33 min Signed: Theodore Demark 06/03/2012, 8:42 AM Co-Sign MD

## 2012-06-03 NOTE — Telephone Encounter (Signed)
Dr. Graciela Husbands, did you ever hear back from Dr. Delena Serve about Tikosyn.

## 2012-06-03 NOTE — Progress Notes (Signed)
CARDIAC REHAB PHASE I   PRE:  Rate/Rhythm: 65 afib  BP:  Supine: 135/81 Sitting:   Standing:    SaO2:   MODE:  Ambulation: 500 ft   POST:  Rate/Rhythem: 74 afib  BP:  Supine:   Sitting: 132/75  Standing:    SaO2:  0750-0840 Pt walked 500 ft on RA with steady gait. Tolerated well. Education completed. Permission given to refer to [facility] Phase 2. Kaitlynd Phillips DunlapRN  Duanne Limerick

## 2012-06-03 NOTE — Progress Notes (Signed)
Subjective:  Stable at present.  Felt flutter last pm.  Tikosyn held last pm.    Objective:  Vital Signs in the last 24 hours: Temp:  [97.8 F (36.6 C)-99.5 F (37.5 C)] 98.1 F (36.7 C) (07/19 0839) Pulse Rate:  [59-67] 64  (07/19 0839) Resp:  [9-25] 23  (07/19 0839) BP: (98-149)/(60-86) 132/65 mmHg (07/19 0839) SpO2:  [97 %-100 %] 99 % (07/19 0839) Weight:  [186 lb 15.2 oz (84.8 kg)] 186 lb 15.2 oz (84.8 kg) (07/19 0432)  Intake/Output from previous day: 07/18 0701 - 07/19 0700 In: 225 [P.O.:225] Out: 1000 [Urine:1000]   Physical Exam: General: Well developed, well nourished, in no acute distress. Head:  Normocephalic and atraumatic. No visible hematoma.  Small pulsatile knot with no bruit noted.   Lab Results:  [facility] 06/03/12 0645  WBC 6.1  HGB 13.2  PLT 176    Basename 06/03/12 0645  NA 134*  K 3.6  CL 99  CO2 26  GLUCOSE 105*  BUN 8  CREATININE 1.01   No results found for this basename: TROPONINI:2,CK,MB:2 in the last 72 hours Hepatic Function Panel No results found for this basename: PROT,ALBUMIN,AST,ALT,ALKPHOS,BILITOT,BILIDIR,IBILI in the last 72 hours No results found for this basename: CHOL in the last 72 hours No results found for this basename: PROTIME in the last 72 hours  Imaging: No results found.  EKG:  Atrial pacing, no acute changes    Assessment/Plan:  CAD   Stable without chest pain following PCI of OM.  PRU 114   Cased discussed with patient and Dr. Graciela Husbands.  Is quite responsive to clopidogrel.  We discussed antithrombin therapy and decided on DAPT for one month, then clopidogrel in combo with Xarelto for six months, then ASA with Xarelto after that.   Atrial flutter   Continue Tikosyn.  Given this am.   Home today.  I will check his groin on Monday      [name], MD, [facility], [facility] 06/03/2012, 8:48 AM

## 2012-06-06 ENCOUNTER — Encounter: Payer: Self-pay | Admitting: Internal Medicine

## 2012-06-06 ENCOUNTER — Ambulatory Visit (INDEPENDENT_AMBULATORY_CARE_PROVIDER_SITE_OTHER): Payer: Medicare Other | Admitting: *Deleted

## 2012-06-06 DIAGNOSIS — I4892 Unspecified atrial flutter: Secondary | ICD-10-CM

## 2012-06-06 DIAGNOSIS — I471 Supraventricular tachycardia: Secondary | ICD-10-CM

## 2012-06-06 DIAGNOSIS — I498 Other specified cardiac arrhythmias: Secondary | ICD-10-CM

## 2012-06-06 LAB — PACEMAKER DEVICE OBSERVATION
AL AMPLITUDE: 4 mv
AL THRESHOLD: 0.875 V
BAMS-0001: 400 {beats}/min
RV LEAD AMPLITUDE: 10.9 mv
RV LEAD THRESHOLD: 1 V

## 2012-06-06 NOTE — Progress Notes (Signed)
Wound check pacer in clinic  

## 2012-06-07 ENCOUNTER — Telehealth: Payer: Self-pay | Admitting: Cardiology

## 2012-06-07 NOTE — Telephone Encounter (Signed)
Please return call to patient [phone]   Pt had stent procedure last week and would like to know if there are any restrictions  As far as his dental care.  Please return call to discuss.

## 2012-06-07 NOTE — Telephone Encounter (Signed)
I spoke with the pt and he is scheduled for a routine dental cleaning.  I made him aware that Dr Riley Kill recommends that he hold off on any routine dental work for at least 2 months due to recent stent and device procedure. The pt agrees and will reschedule cleaning. The pt does not require SBE per Dr Riley Kill.

## 2012-06-18 NOTE — Discharge Summary (Signed)
See my note on the day of discharge.  Patient also seen by Dr. Graciela Husbands today, and treatment strategy discussed.  He is very sensitive to plavix as noted.  See note.

## 2012-06-20 ENCOUNTER — Telehealth: Payer: Self-pay | Admitting: Internal Medicine

## 2012-06-20 NOTE — Telephone Encounter (Signed)
Will forward to [place] and Gunnar Fusi to contact the patient.

## 2012-06-20 NOTE — Telephone Encounter (Signed)
Spoke w/pt and scheduled for device check 06-21-12 @ 900/kwb

## 2012-06-20 NOTE — Telephone Encounter (Signed)
Please return call to patient [phone]  Patient c/o of ventricular pacing 88 BPM (8hrs on Sat).

## 2012-06-21 ENCOUNTER — Encounter: Payer: Self-pay | Admitting: Internal Medicine

## 2012-06-21 ENCOUNTER — Ambulatory Visit (INDEPENDENT_AMBULATORY_CARE_PROVIDER_SITE_OTHER): Payer: Medicare Other | Admitting: *Deleted

## 2012-06-21 DIAGNOSIS — I498 Other specified cardiac arrhythmias: Secondary | ICD-10-CM

## 2012-06-21 LAB — PACEMAKER DEVICE OBSERVATION
AL AMPLITUDE: 3.75 mv
AL IMPEDENCE PM: 494 Ohm
BATTERY VOLTAGE: 3.0413 V
RV LEAD AMPLITUDE: 10.875 mv
RV LEAD IMPEDENCE PM: 532 Ohm

## 2012-06-21 NOTE — Progress Notes (Signed)
Pacer check in clinic  

## 2012-06-23 ENCOUNTER — Ambulatory Visit (HOSPITAL_COMMUNITY): Payer: Medicare Other

## 2012-06-27 ENCOUNTER — Encounter (HOSPITAL_COMMUNITY): Payer: Medicare Other

## 2012-06-28 NOTE — Telephone Encounter (Signed)
Per Dr. Graciela Husbands, he did hear from Dr. Delena Serve- Multaq was suggested, but after the patient's atrial lead was fixed he has felt much better.

## 2012-06-29 ENCOUNTER — Encounter (HOSPITAL_COMMUNITY): Payer: Medicare Other

## 2012-06-30 ENCOUNTER — Encounter (HOSPITAL_COMMUNITY)
Admission: RE | Admit: 2012-06-30 | Discharge: 2012-06-30 | Disposition: A | Discharge status: Home / Self Care | Payer: Medicare Other | Source: Ambulatory Visit | Attending: Cardiology | Admitting: Cardiology

## 2012-06-30 DIAGNOSIS — I2510 Atherosclerotic heart disease of native coronary artery without angina pectoris: Secondary | ICD-10-CM | POA: Insufficient documentation

## 2012-06-30 DIAGNOSIS — Z5189 Encounter for other specified aftercare: Secondary | ICD-10-CM | POA: Insufficient documentation

## 2012-06-30 DIAGNOSIS — I4891 Unspecified atrial fibrillation: Secondary | ICD-10-CM | POA: Insufficient documentation

## 2012-06-30 NOTE — Progress Notes (Signed)
Cardiac Rehab Medication Review by a Pharmacist  Does the patient  feel that his/her medications are working for him/her?  Overall yes, but the dofetilide is needing many dose adjustments  Has the patient been experiencing any side effects to the medications prescribed?  Yes, dofetilide  Does the patient measure his/her own blood pressure or blood glucose at home?  no   Does the patient have any problems obtaining medications due to transportation or finances?   no  Understanding of regimen: excellent Understanding of indications: excellent Potential of compliance: excellent    Pharmacist comments: Patient was very well informed on his medications and is very proactive about reading about his medications and had good questions for me.   Vania Rea. Darin Engels.D. Clinical Pharmacist Pager [phone] Phone [phone] 06/30/2012 8:52 AM

## 2012-07-01 ENCOUNTER — Encounter (HOSPITAL_COMMUNITY): Payer: Medicare Other

## 2012-07-01 ENCOUNTER — Telehealth: Payer: Self-pay | Admitting: *Deleted

## 2012-07-01 ENCOUNTER — Telehealth (HOSPITAL_COMMUNITY): Payer: Self-pay | Admitting: Cardiac Rehabilitation

## 2012-07-01 NOTE — Telephone Encounter (Signed)
I called the patient per Dr. Graciela Husbands to follow up on an app he has for tracking his heart rhythm at home. The patient stated he was going to call us. He states his ventricular rates were 88 about 2 weeks ago. He came in for interrogation and thinks some reprogramming was done. He states he is now noticing ventricular rates at 80-88 and having palpitations. I informed him I would forward to device clinic to see if any thing different needs to be done prior to his appointment on 8/30 with Dr. Graciela Husbands.

## 2012-07-04 ENCOUNTER — Encounter: Payer: Self-pay | Admitting: Internal Medicine

## 2012-07-04 ENCOUNTER — Encounter (HOSPITAL_COMMUNITY): Payer: Self-pay

## 2012-07-04 ENCOUNTER — Encounter (HOSPITAL_COMMUNITY)
Admission: RE | Admit: 2012-07-04 | Discharge: 2012-07-04 | Disposition: A | Discharge status: Home / Self Care | Payer: Medicare Other | Source: Ambulatory Visit | Attending: Cardiology | Admitting: Cardiology

## 2012-07-04 ENCOUNTER — Ambulatory Visit (INDEPENDENT_AMBULATORY_CARE_PROVIDER_SITE_OTHER): Payer: Medicare Other | Admitting: Internal Medicine

## 2012-07-04 ENCOUNTER — Ambulatory Visit (INDEPENDENT_AMBULATORY_CARE_PROVIDER_SITE_OTHER): Payer: Medicare Other | Admitting: *Deleted

## 2012-07-04 ENCOUNTER — Telehealth: Payer: Self-pay | Admitting: Internal Medicine

## 2012-07-04 VITALS — BP 126/72 | HR 78 | Ht 72.5 in | Wt 192.0 lb

## 2012-07-04 DIAGNOSIS — I471 Supraventricular tachycardia: Secondary | ICD-10-CM

## 2012-07-04 DIAGNOSIS — I498 Other specified cardiac arrhythmias: Secondary | ICD-10-CM

## 2012-07-04 DIAGNOSIS — I4892 Unspecified atrial flutter: Secondary | ICD-10-CM

## 2012-07-04 NOTE — Telephone Encounter (Signed)
Pt has questions and concerns regarding his pacer and wants to give [place] information. He didn't want to talk to me about it because it is to complicated

## 2012-07-04 NOTE — Assessment & Plan Note (Addendum)
We spent about 45 minutes talking about options related to his atrial flutter. 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 Hurman Horn at [facility] says that this would not preclude this procedure #3 surgical maze #4 live with it. #5 amiodarone Given his relative youth, #4 is not very appealing nor is #5. I am not a fan of multaq.  He is scheduled to see Dr. Delena Serve in [place] in September. He will review his options with him at that time and for right now continue taking dofetilide

## 2012-07-04 NOTE — Progress Notes (Signed)
HPI  [name] is a 66 y.o. male Seen because of recurrent atrial flutter following catheter ablation with relatively rapid ventricular pacing associated with it.  Earlier this morning his device had been reprogrammed DDIR. While he was here I attempted to pace terminate atrial flutter without success he terminated spontaneously during our manipulations and I re\re programmed in DDDR and lower his mode switch rate from 180-150.  Past Medical History  Diagnosis Date  . Hyperlipidemia   . Pulsatile tinnitus   . 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  . Familial tremor   . Hypertension   . Benign paroxysmal vertigo   . Heart murmur   . Atrial flutter -atypical 2007    ABLATION x2 [facility]  . Atrial tachycardia 2007    ABLATION [facility]  . First degree AV block   . Tachy-brady syndrome     a. developed after RFCA @ [facility] 2013-> MDT PPM;  b. s/p RA lead revision 05/2012  . History of blood transfusion 2010    "2nd day after prostatectomy"  . Migraines   . Prostate cancer   . Carcinoma     "off my chest"  . CAD (coronary artery disease)     a. abnl mv 01/2012;  b. cath 05/23/2012 LCX 80@ bifurcation w/ OM, 95% @ AV groove, otw nonobs dzs, EF 55-65%;    Past Surgical History  Procedure Date  . Prostatectomy 02/20/2009    COMPLICATED  . Shoulder surgery     Bilateral; "boney spurs removed"  . Atrial ablation surgery 04/01/12    "4th time I had one"  . Colonoscopy   . Cardioversion 12/03/2011    Procedure: CARDIOVERSION;  Surgeon: Duke Salvia, MD;  Location: [facility] OR;  Service: Cardiovascular;  Laterality: [name];  . Cardioversion 01/13/2012    Procedure: CARDIOVERSION;  Surgeon: Lewayne Bunting, MD;  Location: [facility] OR;  Service: Cardiovascular;  Laterality: [name];  . Cardioversion 04/27/2012    Procedure: CARDIOVERSION;  Surgeon: Cassell Clement, MD;  Location[facility] OR;  Service: Cardiovascular;  Laterality: [name];  . Insert / replace / remove pacemaker 03/2012   initial placement  . Insert / replace / remove pacemaker 05/25/12    "atrial lead change"  . Inguinal hernia repair 1945    left side  . Hernia repair 1980's  . Tonsillectomy and adenoidectomy     "as a child"  . Skin cancer excision     "in situ; right chest"  . Cardiac catheterization   . Coronary angioplasty with stent placement 06/02/12    "1; first one ever"    Current Outpatient Prescriptions  Medication Sig Dispense Refill  . acyclovir (ZOVIRAX) 200 MG capsule Take 200 mg by mouth as needed. For outbreak      . aspirin EC 81 MG tablet Take 81 mg by mouth daily.      . cholecalciferol (VITAMIN D) 1000 UNITS tablet Take 1,000 Units by mouth daily.      . clopidogrel (PLAVIX) 75 MG tablet Take 1 tablet (75 mg total) by mouth daily.  30 tablet  6  . dofetilide (TIKOSYN) 250 MCG capsule Take 250 mcg by mouth 2 (two) times daily.      Marland Kitchen NIFEdipine (PROCARDIA XL/ADALAT-CC) 30 MG 24 hr tablet Take 2 tablets (60 mg total) by mouth daily.  60 tablet  6  . nitroGLYCERIN (NITROSTAT) 0.4 MG SL tablet Place 1 tablet (0.4 mg total) under the tongue every 5 (five) minutes as needed  for chest pain.  25 tablet  3  . propranolol (INDERAL) 10 MG tablet Take one tablet by mouth three times daily as needed  90 tablet  2  . vitamin B-12 (CYANOCOBALAMIN) 1000 MCG tablet Take 1,000 mcg by mouth daily.        Allergies  Allergen Reactions  . Ceclor (Cefaclor) Hives  . Chlorhexidine Itching and Rash  . Statins Other (See Comments)    Muscle weakness  . Ace Inhibitors Other (See Comments)    Does not take due to "angiotensin is responsible for maintaining bladder tone"    Review of Systems negative except from HPI and PMH  Physical Exam BP 126/72  Pulse 78  Ht 6' 0.5" (1.842 m)  Wt 192 lb (87.091 kg)  BMI 25.68 kg/m2 Well developed and well nourished in no acute distress HENT normal Suture retained and removed    Assessment and  Plan

## 2012-07-04 NOTE — Telephone Encounter (Signed)
Pt seen in clinic today/kwb

## 2012-07-04 NOTE — Progress Notes (Addendum)
Pt started cardiac rehab today.  Pt tolerated light exercise without difficulty.  Asymptomatic. VSS, telemetry-SR, V pacing.  Occasional inappropriate pacing spike noted, perhaps due to oversensing.  Device clinic made aware.  Rhythm strip scanned in for review.  Pt oriented to exercise equipment and routine.  Understanding verbalized.

## 2012-07-04 NOTE — Telephone Encounter (Signed)
Dr Lenard Forth has questions about his device that i can't answer--informatuon passed along to amber siler to call dr Lenard Forth back

## 2012-07-06 ENCOUNTER — Encounter (HOSPITAL_COMMUNITY)
Admission: RE | Admit: 2012-07-06 | Discharge: 2012-07-06 | Disposition: A | Discharge status: Home / Self Care | Payer: Medicare Other | Source: Ambulatory Visit | Attending: Cardiology | Admitting: Cardiology

## 2012-07-07 ENCOUNTER — Encounter: Payer: Self-pay | Admitting: Internal Medicine

## 2012-07-07 DIAGNOSIS — R0989 Other specified symptoms and signs involving the circulatory and respiratory systems: Secondary | ICD-10-CM

## 2012-07-07 LAB — PACEMAKER DEVICE OBSERVATION: BAMS-0001: 180 {beats}/min

## 2012-07-08 ENCOUNTER — Encounter (HOSPITAL_COMMUNITY): Payer: Medicare Other

## 2012-07-11 ENCOUNTER — Encounter (HOSPITAL_COMMUNITY): Admission: RE | Admit: 2012-07-11 | Payer: Medicare Other | Source: Ambulatory Visit

## 2012-07-13 ENCOUNTER — Encounter (HOSPITAL_COMMUNITY)
Admission: RE | Admit: 2012-07-13 | Discharge: 2012-07-13 | Disposition: A | Discharge status: Home / Self Care | Payer: Medicare Other | Source: Ambulatory Visit | Attending: Cardiology | Admitting: Cardiology

## 2012-07-15 ENCOUNTER — Encounter: Payer: Self-pay | Admitting: Internal Medicine

## 2012-07-15 ENCOUNTER — Ambulatory Visit (INDEPENDENT_AMBULATORY_CARE_PROVIDER_SITE_OTHER): Payer: Medicare Other | Admitting: Internal Medicine

## 2012-07-15 ENCOUNTER — Encounter (HOSPITAL_COMMUNITY)
Admission: RE | Admit: 2012-07-15 | Discharge: 2012-07-15 | Disposition: A | Discharge status: Home / Self Care | Payer: Medicare Other | Source: Ambulatory Visit | Attending: Cardiology | Admitting: Cardiology

## 2012-07-15 VITALS — BP 126/80 | HR 69 | Ht 72.0 in | Wt 191.0 lb

## 2012-07-15 DIAGNOSIS — I498 Other specified cardiac arrhythmias: Secondary | ICD-10-CM

## 2012-07-15 DIAGNOSIS — I471 Supraventricular tachycardia: Secondary | ICD-10-CM

## 2012-07-15 LAB — PACEMAKER DEVICE OBSERVATION

## 2012-07-15 NOTE — Assessment & Plan Note (Addendum)
Atrial arrhythmias are better. We'll continue for pacing mode. We had a lengthy discussion regarding next steps. We will wait for symptoms to define that. We discussed catheter ablationProcedure. We discussed cut and sew maze as an incompletely curative procedure. We'll see  how  he does.  We were in touch with Dr. Delena Serve down at [facility] and the patient will be seen there to consider the above options

## 2012-07-15 NOTE — Progress Notes (Signed)
Patient Care Team: Darnelle Bos, MD as PCP - General (Internal Medicine)   HPI  [name] is a 66 y.o. male Seen in followup for incessant atrial flutter. We have reprogrammed his pacemaker considerably trying to help him avoid ventricular pacing. He has prolonged first degree AV block as well as sinus node dysfunction he does incessant atrial flutter he went back to see Dr. Delena Serve at [place]. He elected not to pursue further catheter ablation because of concerns about the likelihood of progressive heart block becoming complete and lead dislodgment. 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  It was suggested that he had more symptoms that he could take multaqc    He also reprogrammed his device to a short AV delay so as to prevent intrinsic conduction and thereby limit the likelihood atrial contraction in the setting of ventricular contraction. He has felt considerably better since then.  Past Medical History  Diagnosis Date  . Hyperlipidemia   . Pulsatile tinnitus   . 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  . Familial tremor   . Hypertension   . Benign paroxysmal vertigo   . Heart murmur   . Atrial flutter -atypical 2007    ABLATION x2 [facility]  . Atrial tachycardia 2007    ABLATION [facility]  . First degree AV block   . Tachy-brady syndrome     a. developed after RFCA @ [facility] 2013-> MDT PPM;  b. s/p RA lead revision 05/2012  . History of blood transfusion 2010    "2nd day after prostatectomy"  . Migraines   . Prostate cancer   . Carcinoma     "off my chest"  . CAD (coronary artery disease)     a. abnl mv 01/2012;  b. cath 05/23/2012 LCX 80@ bifurcation w/ OM, 95% @ AV groove, otw nonobs dzs, EF 55-65%;    Past Surgical History  Procedure Date  . Prostatectomy 02/20/2009    COMPLICATED  . Shoulder surgery     Bilateral; "boney spurs removed"  . Atrial  ablation surgery 04/01/12    "4th time I had one"  . Colonoscopy   . Cardioversion 12/03/2011    Procedure: CARDIOVERSION;  Surgeon: Duke Salvia, MD;  Location: [facility] OR;  Service: Cardiovascular;  Laterality: [name];  . Cardioversion 01/13/2012    Procedure: CARDIOVERSION;  Surgeon: Lewayne Bunting, MD;  Location: [facility] OR;  Service: Cardiovascular;  Laterality: [name];  . Cardioversion 04/27/2012    Procedure: CARDIOVERSION;  Surgeon: Cassell Clement, MD;  Location: [facility] OR;  Service: Cardiovascular;  Laterality: [name];  . Insert / replace / remove pacemaker 03/2012    initial placement  . Insert / replace / remove pacemaker 05/25/12    "atrial lead change"  . Inguinal hernia repair 1945    left side  . Hernia repair 1980's  . Tonsillectomy and adenoidectomy     "as a child"  . Skin cancer excision     "in situ; right chest"  . Cardiac catheterization   . Coronary angioplasty with stent placement 06/02/12    "1; first one ever"    Current Outpatient Prescriptions  Medication Sig Dispense Refill  . acyclovir (ZOVIRAX) 200 MG capsule Take 200 mg by mouth as needed. For outbreak      . aspirin EC 81 MG tablet Take 81 mg by mouth daily.      . cholecalciferol (VITAMIN D)  1000 UNITS tablet Take 1,000 Units by mouth daily.      . clopidogrel (PLAVIX) 75 MG tablet Take 1 tablet (75 mg total) by mouth daily.  30 tablet  6  . dofetilide (TIKOSYN) 250 MCG capsule Take 250 mcg by mouth 2 (two) times daily.      Marland Kitchen NIFEdipine (PROCARDIA XL/ADALAT-CC) 30 MG 24 hr tablet Take 2 tablets (60 mg total) by mouth daily.  60 tablet  6  . nitroGLYCERIN (NITROSTAT) 0.4 MG SL tablet Place 1 tablet (0.4 mg total) under the tongue every 5 (five) minutes as needed for chest pain.  25 tablet  3  . propranolol (INDERAL) 10 MG tablet Take one tablet by mouth three times daily as needed  90 tablet  2  . vitamin B-12 (CYANOCOBALAMIN) 1000 MCG tablet Take 1,000 mcg by mouth daily.        Allergies  Allergen Reactions  .  Ceclor (Cefaclor) Hives  . Chlorhexidine Itching and Rash  . Statins Other (See Comments)    Muscle weakness  . Ace Inhibitors Other (See Comments)    Does not take due to "angiotensin is responsible for maintaining bladder tone"    Review of Systems negative except from HPI and PMH  Physical Exam BP 126/80  Pulse 69  Ht 6' (1.829 m)  Wt 191 lb (86.637 kg)  BMI 25.90 kg/m2 Well developed and nourished in no acute distress HENT normal Neck supple with JVP-flat Clear Regular rate and rhythm, no murmurs or gallops Abd-soft with active BS No Clubbing cyanosis edema Skin-warm and dry A & Oriented  Grossly normal sensory and motor function   electorocardiogram demonstrates AV pacing  Assessment and  Plan

## 2012-07-15 NOTE — Progress Notes (Signed)
Reviewed home exercise guidelines with patient including endpoints, calling 911, temperature guidelines, THR and RPE scale. Pt is walking for his home exercise and [place] prior to his cardiac event. Pt voices understanding of instructions given.

## 2012-07-18 ENCOUNTER — Encounter (HOSPITAL_COMMUNITY): Payer: Medicare Other

## 2012-07-20 ENCOUNTER — Encounter (HOSPITAL_COMMUNITY)
Admission: RE | Admit: 2012-07-20 | Discharge: 2012-07-20 | Disposition: A | Discharge status: Home / Self Care | Payer: Medicare Other | Source: Ambulatory Visit | Attending: Cardiology | Admitting: Cardiology

## 2012-07-20 DIAGNOSIS — I2510 Atherosclerotic heart disease of native coronary artery without angina pectoris: Secondary | ICD-10-CM | POA: Insufficient documentation

## 2012-07-20 DIAGNOSIS — Z5189 Encounter for other specified aftercare: Secondary | ICD-10-CM | POA: Insufficient documentation

## 2012-07-20 DIAGNOSIS — I318 Other specified diseases of pericardium: Secondary | ICD-10-CM | POA: Insufficient documentation

## 2012-07-21 ENCOUNTER — Telehealth: Payer: Self-pay | Admitting: Internal Medicine

## 2012-07-21 DIAGNOSIS — I4891 Unspecified atrial fibrillation: Secondary | ICD-10-CM

## 2012-07-21 NOTE — Telephone Encounter (Signed)
I left a message for the patient that I would call back in the morning.

## 2012-07-21 NOTE — Telephone Encounter (Signed)
Pt has stopped tykosin and you call him and yall can talk about it

## 2012-07-22 ENCOUNTER — Encounter (HOSPITAL_COMMUNITY): Payer: Medicare Other

## 2012-07-22 MED ORDER — PROPRANOLOL HCL 10 MG PO TABS: ORAL_TABLET | ORAL | Status: DC

## 2012-07-22 NOTE — Telephone Encounter (Signed)
Patient returning nurse call, he can be reached at hm# °

## 2012-07-22 NOTE — Telephone Encounter (Signed)
I left a message for the patient to call. 

## 2012-07-22 NOTE — Telephone Encounter (Signed)
Per Dr. Graciela Husbands- ok to take inderal 10 mg TID. The patient is aware. I will update his RX at [facility] on [address].

## 2012-07-22 NOTE — Telephone Encounter (Signed)
I spoke with the patient. He states that he was feeling bad on tikosyn. He stopped this about 3 days ago. He states he feels better than he has in 6 months since stopping this. Per the patient, he has had minimal arrhythmias noted. He stated that Dr. Delena Serve explained dronedarone could be a next option for him. He wonders if it may be better to be off the [place] a little longer and let his symptoms subside prior to starting this is Dr. Graciela Husbands feels this is appropriate. He also states Dr. Graciela Husbands has told him he has blocked PAC's. He wanted to know in the interim if he should be on inderal TID. He previously had a prescription for 10 mg TID prn. He doesn't know if the dose needs to be adjusted. I advised I would review with Dr. Graciela Husbands and call him back.

## 2012-07-25 ENCOUNTER — Encounter (HOSPITAL_COMMUNITY)
Admission: RE | Admit: 2012-07-25 | Discharge: 2012-07-25 | Disposition: A | Discharge status: Home / Self Care | Payer: Medicare Other | Source: Ambulatory Visit | Attending: Cardiology | Admitting: Cardiology

## 2012-07-27 ENCOUNTER — Encounter (HOSPITAL_COMMUNITY)
Admission: RE | Admit: 2012-07-27 | Discharge: 2012-07-27 | Disposition: A | Discharge status: Home / Self Care | Payer: Medicare Other | Source: Ambulatory Visit | Attending: Cardiology | Admitting: Cardiology

## 2012-07-29 ENCOUNTER — Encounter (HOSPITAL_COMMUNITY)
Admission: RE | Admit: 2012-07-29 | Discharge: 2012-07-29 | Disposition: A | Discharge status: Home / Self Care | Payer: Medicare Other | Source: Ambulatory Visit | Attending: Cardiology | Admitting: Cardiology

## 2012-07-29 NOTE — Progress Notes (Signed)
[name] 66 y.o. male Nutrition Note Spoke with pt.  Nutrition Plan, Nutrition Survey, and cholesterol goals reviewed with pt. Pt is following Step 2 of the Therapeutic Lifestyle Changes diet. Pt intolerant to statin medications. Pt reports he has tried [employer] supplements and gotten muscle weakness/fatigue. Increasing soluble fiber to a minimum of 10 gm daily discussed to help with lipid management. Pt expressed understanding. Nutrition Diagnosis Food-and nutrition-related knowledge deficit related to lack of exposure to information as related to diagnosis of: ? CVD  Nutrition RX/ Estimated Daily Nutrition Needs for: wt maintenance 2500-2700 Kcal, 80-90 gm fat, 16-18 gm sat fat, 2.5-2.7 gm trans-fat, <1500 mg sodium  Nutrition Intervention   Pt's individual nutrition plan including cholesterol goals reviewed with pt.   Benefits of adopting Therapeutic Lifestyle Changes discussed when Medficts reviewed.   Pt to attend the Portion Distortion class   Pt to attend the  ? Nutrition I class                     ? Nutrition II class    Pt given handouts for: high fiber nutrition therapy   Continue client-centered nutrition education by RD, as part of interdisciplinary care. Goal(s)   Pt to describe the potential benefits of increasing soluble fiber in the diet to help improve lipids   Pt to identify and increase food sources containing soluble fiber in the diet. Monitor and Evaluate progress toward nutrition goal with team. Nutrition Risk: Low

## 2012-08-01 ENCOUNTER — Encounter (HOSPITAL_COMMUNITY)
Admission: RE | Admit: 2012-08-01 | Discharge: 2012-08-01 | Disposition: A | Discharge status: Home / Self Care | Payer: Medicare Other | Source: Ambulatory Visit | Attending: Cardiology | Admitting: Cardiology

## 2012-08-02 ENCOUNTER — Telehealth: Payer: Self-pay | Admitting: Cardiology

## 2012-08-02 NOTE — Telephone Encounter (Signed)
Left message to call back  

## 2012-08-02 NOTE — Telephone Encounter (Signed)
I spoke with the pt and he is scheduled for dental cleaning on 08/10/12.  Per 06/07/12 phone note Dr Riley Kill wanted the pt to wait 2 months before cleaning. I made the pt aware that it is okay to proceed.

## 2012-08-02 NOTE — Telephone Encounter (Signed)
Plz return call to pt at hm# regarding upcoming dental appnt.

## 2012-08-03 ENCOUNTER — Encounter (HOSPITAL_COMMUNITY)
Admission: RE | Admit: 2012-08-03 | Discharge: 2012-08-03 | Disposition: A | Discharge status: Home / Self Care | Payer: Medicare Other | Source: Ambulatory Visit | Attending: Cardiology | Admitting: Cardiology

## 2012-08-05 ENCOUNTER — Encounter (HOSPITAL_COMMUNITY)
Admission: RE | Admit: 2012-08-05 | Discharge: 2012-08-05 | Disposition: A | Discharge status: Home / Self Care | Payer: Medicare Other | Source: Ambulatory Visit | Attending: Cardiology | Admitting: Cardiology

## 2012-08-08 ENCOUNTER — Telehealth (HOSPITAL_COMMUNITY): Payer: Self-pay | Admitting: Cardiac Rehabilitation

## 2012-08-08 ENCOUNTER — Encounter (HOSPITAL_COMMUNITY)
Admission: RE | Admit: 2012-08-08 | Discharge: 2012-08-08 | Disposition: A | Discharge status: Home / Self Care | Payer: Medicare Other | Source: Ambulatory Visit | Attending: Cardiology | Admitting: Cardiology

## 2012-08-08 NOTE — Telephone Encounter (Signed)
Pt called to report he has dentist appt 08/10/12 will be absent from cardiac rehab

## 2012-08-10 ENCOUNTER — Encounter (HOSPITAL_COMMUNITY): Payer: Medicare Other

## 2012-08-12 ENCOUNTER — Encounter (HOSPITAL_COMMUNITY)
Admission: RE | Admit: 2012-08-12 | Discharge: 2012-08-12 | Disposition: A | Discharge status: Home / Self Care | Payer: Medicare Other | Source: Ambulatory Visit | Attending: Cardiology | Admitting: Cardiology

## 2012-08-12 NOTE — Progress Notes (Signed)
Pt notified rehab staff at the end of the exercise session that he would not be returning this would be his last day to attend.  Pt stated that he was doing more exercise at home than he was presently doing in rehab.  Offered to pt to return on Monday to complete discharge  assessmens.  Pt declined.  Medication list reconciled.

## 2012-08-15 ENCOUNTER — Encounter (HOSPITAL_COMMUNITY): Payer: Medicare Other

## 2012-08-15 ENCOUNTER — Telehealth: Payer: Self-pay | Admitting: Cardiology

## 2012-08-15 NOTE — Telephone Encounter (Signed)
New problem:  C/o fatigue, headache, can he switch back to  Hctz/ triam .

## 2012-08-15 NOTE — Telephone Encounter (Signed)
I spoke with the pt and he would like to stop Nifedipine and go back to Triamterene HCTZ 37.5/25mg  one-half tablet daily.  The pt states that he has been off of Tikosyn for one month. The pt feels like Nifedipine is causing his fatigue and headache. I will discuss this pt with Dr Riley Kill for further recommendations.

## 2012-08-16 NOTE — Telephone Encounter (Signed)
Per Dr Riley Kill the pt can stop Nifedipine and restart Triamterene HCT.  The pt is taking Nifedipine 60mg  daily.  Dr Riley Kill would like the pt to decrease Nifedipine to 30mg  daily for 2-3 days and then stop this medication. The pt can restart Triamterene HCT at previous dose.  Dr Riley Kill would also like the pt to move up his appointment to next week.  I left a message on the pt's voicemail and will call him again on Wednesday.

## 2012-08-17 ENCOUNTER — Encounter (HOSPITAL_COMMUNITY): Payer: Medicare Other

## 2012-08-17 NOTE — Telephone Encounter (Signed)
I spoke with the pt and he is currently only taking Nifedipine 30mg  daily. The pt will stop this medication and restart Triamterene HCT 37.5/25mg  take one-half tablet by mouth daily.  The pt knows to monitor his BP. I have also rescheduled the pt's appointment to 08/22/12 with Dr Riley Kill.

## 2012-08-17 NOTE — Telephone Encounter (Signed)
F/U   Returning call back to nurse.   

## 2012-08-19 ENCOUNTER — Encounter (HOSPITAL_COMMUNITY): Payer: Medicare Other

## 2012-08-22 ENCOUNTER — Encounter: Payer: Self-pay | Admitting: Cardiology

## 2012-08-22 ENCOUNTER — Encounter (HOSPITAL_COMMUNITY): Payer: Medicare Other

## 2012-08-22 ENCOUNTER — Ambulatory Visit (INDEPENDENT_AMBULATORY_CARE_PROVIDER_SITE_OTHER): Payer: Medicare Other | Admitting: Cardiology

## 2012-08-22 VITALS — BP 114/82 | HR 76 | Ht 72.0 in | Wt 194.0 lb

## 2012-08-22 DIAGNOSIS — I4892 Unspecified atrial flutter: Secondary | ICD-10-CM

## 2012-08-22 DIAGNOSIS — I498 Other specified cardiac arrhythmias: Secondary | ICD-10-CM

## 2012-08-22 DIAGNOSIS — I2510 Atherosclerotic heart disease of native coronary artery without angina pectoris: Secondary | ICD-10-CM

## 2012-08-22 LAB — BASIC METABOLIC PANEL
BUN: 12 mg/dL (ref 6–23)
Calcium: 9.2 mg/dL (ref 8.4–10.5)
GFR: 82.71 mL/min (ref >60.00)
Glucose, Bld: 93 mg/dL (ref 70–99)

## 2012-08-22 LAB — CBC WITH DIFFERENTIAL/PLATELET
Basophils Relative: 0.5 % (ref 0.0–3.0)
Eosinophils Relative: 2 % (ref 0.0–5.0)
HCT: 43.7 % (ref 39.0–52.0)
Lymphs Abs: 1.1 10*3/uL (ref 0.7–4.0)
MCV: 90.3 fl (ref 78.0–100.0)
Monocytes Absolute: 0.7 10*3/uL (ref 0.1–1.0)
Neutro Abs: 3.9 10*3/uL (ref 1.4–7.7)
Platelets: 199 10*3/uL (ref 150.0–400.0)
RBC: 4.84 Mil/uL (ref 4.22–5.81)
WBC: 5.8 10*3/uL (ref 4.5–10.5)

## 2012-08-22 LAB — URINALYSIS
Hgb urine dipstick: NEGATIVE
Total Protein, Urine: NEGATIVE
Urine Glucose: NEGATIVE

## 2012-08-22 LAB — HEPATIC FUNCTION PANEL: Albumin: 4.3 g/dL (ref 3.5–5.2)

## 2012-08-22 NOTE — Patient Instructions (Addendum)
Your physician recommends that you return for lab work in: CBC, BMP, LIVER and Urinalysis  Your physician recommends that you schedule a follow-up appointment in: 2 MONTHS with Dr Riley Kill

## 2012-08-23 ENCOUNTER — Other Ambulatory Visit: Payer: Self-pay | Admitting: Internal Medicine

## 2012-08-23 ENCOUNTER — Ambulatory Visit
Admission: RE | Admit: 2012-08-23 | Discharge: 2012-08-23 | Disposition: A | Discharge status: Home / Self Care | Payer: Medicare Other | Source: Ambulatory Visit | Attending: Internal Medicine | Admitting: Internal Medicine

## 2012-08-23 DIAGNOSIS — R51 Headache: Secondary | ICD-10-CM

## 2012-08-24 ENCOUNTER — Encounter (HOSPITAL_COMMUNITY): Payer: Medicare Other

## 2012-08-26 ENCOUNTER — Other Ambulatory Visit: Payer: Self-pay

## 2012-08-26 ENCOUNTER — Encounter (HOSPITAL_COMMUNITY): Payer: Medicare Other

## 2012-08-29 ENCOUNTER — Encounter (HOSPITAL_COMMUNITY): Payer: Medicare Other

## 2012-08-31 ENCOUNTER — Encounter (HOSPITAL_COMMUNITY): Payer: Medicare Other

## 2012-09-02 ENCOUNTER — Encounter (HOSPITAL_COMMUNITY): Payer: Medicare Other

## 2012-09-05 ENCOUNTER — Encounter (HOSPITAL_COMMUNITY): Payer: Medicare Other

## 2012-09-06 ENCOUNTER — Encounter: Payer: Medicare Other | Admitting: Cardiology

## 2012-09-06 ENCOUNTER — Encounter: Payer: Self-pay | Admitting: Internal Medicine

## 2012-09-06 ENCOUNTER — Ambulatory Visit (INDEPENDENT_AMBULATORY_CARE_PROVIDER_SITE_OTHER): Payer: Medicare Other | Admitting: Internal Medicine

## 2012-09-06 VITALS — BP 136/94 | HR 78 | Ht 72.0 in | Wt 193.4 lb

## 2012-09-06 DIAGNOSIS — E871 Hypo-osmolality and hyponatremia: Secondary | ICD-10-CM | POA: Insufficient documentation

## 2012-09-06 DIAGNOSIS — I4892 Unspecified atrial flutter: Secondary | ICD-10-CM

## 2012-09-06 DIAGNOSIS — Z950 Presence of cardiac pacemaker: Secondary | ICD-10-CM

## 2012-09-06 LAB — BASIC METABOLIC PANEL
CO2: 29 mEq/L (ref 19–32)
Calcium: 9.2 mg/dL (ref 8.4–10.5)
Chloride: 100 mEq/L (ref 96–112)
Sodium: 135 mEq/L (ref 135–145)

## 2012-09-06 NOTE — Assessment & Plan Note (Signed)
The patient's device was interrogated.  The information was reviewed. No changes were made in the programming.    

## 2012-09-06 NOTE — Assessment & Plan Note (Signed)
As above.

## 2012-09-06 NOTE — Patient Instructions (Signed)
BMET Today.  Your physician wants you to follow-up in: 6 months with Dr. Graciela Husbands.  You will receive a reminder letter in the mail two months in advance. If you don't receive a letter, please call our office to schedule the follow-up appointment.

## 2012-09-06 NOTE — Assessment & Plan Note (Signed)
Stable but a little elevated

## 2012-09-06 NOTE — Assessment & Plan Note (Signed)
The patient has had no significant atrial arrhythmia. He is off anticoagulation. I will need to review with Dr. Riley Kill issues related to platelet inhibition and anticoagulants given his stenting.

## 2012-09-06 NOTE — Progress Notes (Signed)
Patient Care Team: Darnelle Bos, MD as PCP - General (Internal Medicine)   HPI  [name] is a 66 y.o. male Seen in followup for incessant atrial flutter. We have reprogrammed his pacemaker considerably trying to help him avoid ventricular pacing. He has prolonged first degree AV block as well as sinus node dysfunction he does incessant atrial flutter he went back to see Dr. Delena Serve at [facility]. He elected not to pursue further catheter ablation because of concerns about the likelihood of progressive heart block becoming complete  t. 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   He has coronary artery disease who was found to have a high-grade bifurcational circumflex lesion 7/13 and underwent DES stenting 06/02/12 with the recommendation of antiplatelet therapy for 6-12 months. It is recommended that he continues his naticoagulation  It was suggested that he had more symptoms that he could take multaq    He also reprogrammed his device to a short AV delay so as to prevent intrinsic conduction and thereby limit the likelihood atrial contraction in the setting of ventricular contraction. He has felt considerably better since then.  He has hypertension and was transitioned from nifedipine to triamterene. This was followed temporally with the development of hyponatremia associated with headaches. It was Dr. Earl Gala thought that this might be RMSF and he treated the patient with doxycycline with improvement in both sodium and headaches. The headaches have recurred.  Heart rhythm issues are largely quiet at this time.  His CHADSVASc score is 3 he is currently on double antiplatelet therapy   Past Medical History  Diagnosis Date  . Hyperlipidemia   . Pulsatile tinnitus   . 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  . Familial tremor   . Hypertension   . Benign paroxysmal  vertigo   . Heart murmur   . Atrial flutter -atypical 2007    ABLATION x2 [facility]  . Atrial tachycardia 2007    ABLATION [facility]  . First degree AV block   . Tachy-brady syndrome     a. developed after RFCA @ [facility] 2013-> MDT PPM;  b. s/p RA lead revision 05/2012  . History of blood transfusion 2010    "2nd day after prostatectomy"  . Migraines   . Prostate cancer   . Carcinoma     "off my chest"  . CAD (coronary artery disease)     a. abnl mv 01/2012;  b. cath 05/23/2012 LCX 80@ bifurcation w/ OM, 95% @ AV groove, otw nonobs dzs, EF 55-65%;    Past Surgical History  Procedure Date  . Prostatectomy 02/20/2009    COMPLICATED  . Shoulder surgery     Bilateral; "boney spurs removed"  . Atrial ablation surgery 04/01/12    "4th time I had one"  . Colonoscopy   . Cardioversion 12/03/2011    Procedure: CARDIOVERSION;  Surgeon: Duke Salvia, MD;  Location: [facility] OR;  Service: Cardiovascular;  Laterality: [name];  . Cardioversion 01/13/2012    Procedure: CARDIOVERSION;  Surgeon: Lewayne Bunting, MD;  Location: [facility] OR;  Service: Cardiovascular;  Laterality: [name];  . Cardioversion 04/27/2012    Procedure: CARDIOVERSION;  Surgeon: Cassell Clement, MD;  Location: [facility] OR;  Service: Cardiovascular;  Laterality: [name];  . Insert / replace / remove pacemaker 03/2012    initial placement  . Insert / replace / remove pacemaker 05/25/12    "atrial lead change"  .  Inguinal hernia repair 1945    left side  . Hernia repair 1980's  . Tonsillectomy and adenoidectomy     "as a child"  . Skin cancer excision     "in situ; right chest"  . Cardiac catheterization   . Coronary angioplasty with stent placement 06/02/12    "1; first one ever"    Current Outpatient Prescriptions  Medication Sig Dispense Refill  . acyclovir (ZOVIRAX) 200 MG capsule Take 200 mg by mouth as needed. For outbreak      . aspirin EC 81 MG tablet Take 81 mg by mouth daily.      . cholecalciferol (VITAMIN D) 1000 UNITS tablet Take 1,000 Units by  mouth daily.      . clopidogrel (PLAVIX) 75 MG tablet Take 1 tablet (75 mg total) by mouth daily.  30 tablet  6  . nitroGLYCERIN (NITROSTAT) 0.4 MG SL tablet Place 1 tablet (0.4 mg total) under the tongue every 5 (five) minutes as needed for chest pain.  25 tablet  3  . propranolol (INDERAL) 10 MG tablet Take one tablet by mouth three times daily as needed  90 tablet  6  . vitamin B-12 (CYANOCOBALAMIN) 1000 MCG tablet Take 1,000 mcg by mouth daily.        Allergies  Allergen Reactions  . Ceclor (Cefaclor) Hives  . Chlorhexidine Itching and Rash  . Statins Other (See Comments)    Muscle weakness  . Ace Inhibitors Other (See Comments)    Does not take due to "angiotensin is responsible for maintaining bladder tone"    Review of Systems negative except from HPI and PMH  Physical Exam BP 136/94  Pulse 78  Ht 6' (1.829 m)  Wt 193 lb 6.4 oz (87.726 kg)  BMI 26.23 kg/m2 Well developed and well nourished in no acute distress HENT normal E scleral and icterus clear Neck Supple JVP flat; carotids brisk and full Clear to ausculation Device pocket well healed; without hematoma or erythema  regular rate and rhythm, no murmurs gallops or rub Soft with active bowel sounds No clubbing cyanosis none Edema Alert and oriented, grossly normal motor and sensory function Skin Warm and Dry    Assessment and  Plan

## 2012-09-06 NOTE — Assessment & Plan Note (Signed)
The patient had hyponatremia associated with headaches. Headaches have recurred. We will recheck his sodium today.

## 2012-09-07 ENCOUNTER — Encounter (HOSPITAL_COMMUNITY): Payer: Medicare Other

## 2012-09-09 ENCOUNTER — Encounter (HOSPITAL_COMMUNITY): Payer: Medicare Other

## 2012-09-09 NOTE — Progress Notes (Signed)
HPI:  [name] returns for follow up.  He has not had much in the way of arrhythmia that he can tell.  He has had some discomfort around the patch site, but nothing else to suggest ischemia.  He feels out of shape.  He has had a headache nearly every day for the past two weeks.  He had a neck injury with wrestling, and a history of migraines.  He also has seen Tilman Neat in the past for headaches.  He denies head trauma but does feel terrible.  He started with this about two days after a teeth cleaning.  The headaches are frontal.  He has an appointment to see Dr. Clovis Riley in the next couple of days.  He denies fever.    Current Outpatient Prescriptions  Medication Sig Dispense Refill  . acyclovir (ZOVIRAX) 200 MG capsule Take 200 mg by mouth as needed. For outbreak      . aspirin EC 81 MG tablet Take 81 mg by mouth daily.      . cholecalciferol (VITAMIN D) 1000 UNITS tablet Take 1,000 Units by mouth daily.      . clopidogrel (PLAVIX) 75 MG tablet Take 1 tablet (75 mg total) by mouth daily.  30 tablet  6  . nitroGLYCERIN (NITROSTAT) 0.4 MG SL tablet Place 1 tablet (0.4 mg total) under the tongue every 5 (five) minutes as needed for chest pain.  25 tablet  3  . propranolol (INDERAL) 10 MG tablet Take one tablet by mouth three times daily as needed  90 tablet  6  . vitamin B-12 (CYANOCOBALAMIN) 1000 MCG tablet Take 1,000 mcg by mouth daily.        Allergies  Allergen Reactions  . Ceclor (Cefaclor) Hives  . Chlorhexidine Itching and Rash  . Statins Other (See Comments)    Muscle weakness  . Ace Inhibitors Other (See Comments)    Does not take due to "angiotensin is responsible for maintaining bladder tone"    Past Medical History  Diagnosis Date  . Hyperlipidemia   . Pulsatile tinnitus   . 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  . Familial tremor   . Hypertension   . Benign paroxysmal vertigo   . Heart murmur   . Atrial flutter -atypical 2007    ABLATION x2  [facility]  . Atrial tachycardia 2007    ABLATION [facility]  . First degree AV block   . Tachy-brady syndrome     a. developed after RFCA @ [facility] 2013-> MDT PPM;  b. s/p RA lead revision 05/2012  . History of blood transfusion 2010    "2nd day after prostatectomy"  . Migraines   . Prostate cancer   . Carcinoma     "off my chest"  . CAD (coronary artery disease)     a. abnl mv 01/2012;  b. cath 05/23/2012 LCX 80@ bifurcation w/ OM, 95% @ AV groove, otw nonobs dzs, EF 55-65%;    Past Surgical History  Procedure Date  . Prostatectomy 02/20/2009    COMPLICATED  . Shoulder surgery     Bilateral; "boney spurs removed"  . Atrial ablation surgery 04/01/12    "4th time I had one"  . Colonoscopy   . Cardioversion 12/03/2011    Procedure: CARDIOVERSION;  Surgeon: Duke Salvia, MD;  Location: [facility] OR;  Service: Cardiovascular;  Laterality: [name];  . Cardioversion 01/13/2012    Procedure: CARDIOVERSION;  Surgeon: Lewayne Bunting, MD;  Location: [facility] OR;  Service:  Cardiovascular;  Laterality: [name];  . Cardioversion 04/27/2012    Procedure: CARDIOVERSION;  Surgeon: Cassell Clement, MD;  Location: [facility] OR;  Service: Cardiovascular;  Laterality: [name];  . Insert / replace / remove pacemaker 03/2012    initial placement  . Insert / replace / remove pacemaker 05/25/12    "atrial lead change"  . Inguinal hernia repair 1945    left side  . Hernia repair 1980's  . Tonsillectomy and adenoidectomy     "as a child"  . Skin cancer excision     "in situ; right chest"  . Cardiac catheterization   . Coronary angioplasty with stent placement 06/02/12    "1; first one ever"    Family History  Problem Relation Age of Onset  . Colon cancer Sister     History   Social History  . Marital Status: Married    Spouse Name: [name]    Number of Children: [name]  . Years of Education: [name]   Occupational History  . Not on file.   Social History Main Topics  . Smoking status: Never Smoker   . Smokeless tobacco: Never Used  .  Alcohol Use: 1.5 oz/week    3 drink(s) per week     05/25/12 "last alcohol 02/2012"  . Drug Use: No  . Sexually Active: Not Currently   Other Topics Concern  . Not on file   Social History Narrative  . No narrative on file    ROS: Please see the HPI.  All other systems reviewed and negative.  PHYSICAL EXAM:  BP 114/82  Pulse 76  Ht 6' (1.829 m)  Wt 194 lb (87.998 kg)  BMI 26.31 kg/m2  General: Well developed, well nourished, in no acute distress. Head:  Normocephalic and atraumatic. Neck: no JVD Lungs: Clear to auscultation and percussion. Heart: Normal S1 and S2.  No murmur, rubs or gallops.  Pulses: Pulses normal in all 4 extremities. Extremities: No clubbing or cyanosis. No edema. Neurologic: Alert and oriented x 3.  EKG:  ASSESSMENT AND PLAN:  1.  Headaches   -  Uncertain etiology.  Consider further studies.  Labs today. 2.  CAD   -   Appears stable. 3.  PAF  ---  Off tikosyn for now.  Chads score is fairly low, but will defer decision to Dr. Graciela Husbands regarding treatment.   4.  Multiple drug intolerances.    He will see Dr. Clovis Riley on Thursday.

## 2012-09-09 NOTE — Assessment & Plan Note (Signed)
Seems to be better controlled these days.  He is on DAPT for now.  We have talked about antithrombin treatment, but will defer to Dr. Graciela Husbands regarding this.  He is a responder to clopidogrel.

## 2012-09-12 ENCOUNTER — Encounter (HOSPITAL_COMMUNITY): Payer: Medicare Other

## 2012-09-14 ENCOUNTER — Encounter (HOSPITAL_COMMUNITY): Payer: Medicare Other

## 2012-09-16 ENCOUNTER — Encounter (HOSPITAL_COMMUNITY): Payer: Medicare Other

## 2012-09-19 ENCOUNTER — Encounter (HOSPITAL_COMMUNITY): Payer: Medicare Other

## 2012-09-21 ENCOUNTER — Encounter (HOSPITAL_COMMUNITY): Payer: Medicare Other

## 2012-09-21 IMAGING — CR DG CHEST 2V
2 series · 2 of 2 positions shown · non-contrast
Comparison: Chest x-ray of 12/03/2011

CLINICAL DATA: Post pacemaker insertion

CHEST - 2 VIEW

[w chest pa]
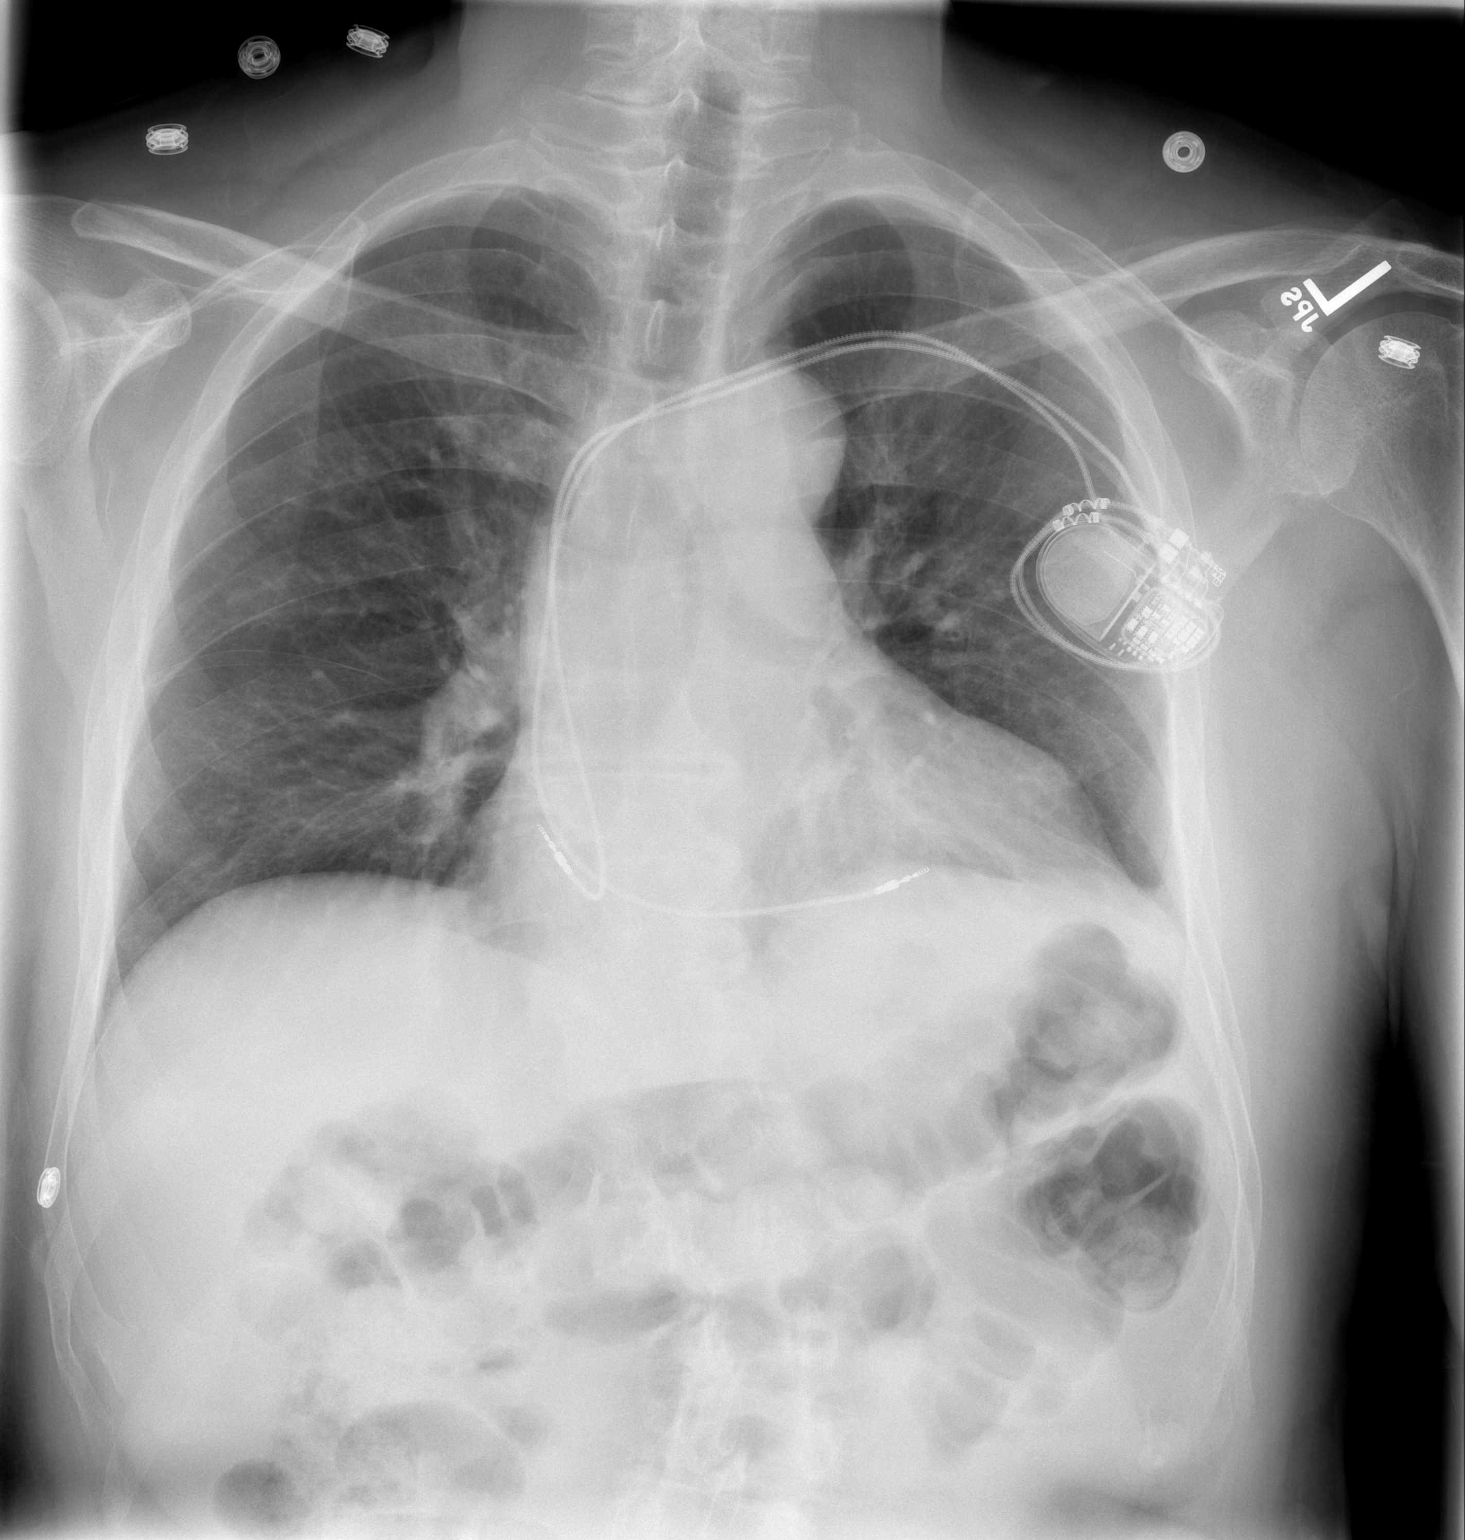

[w chest lat]
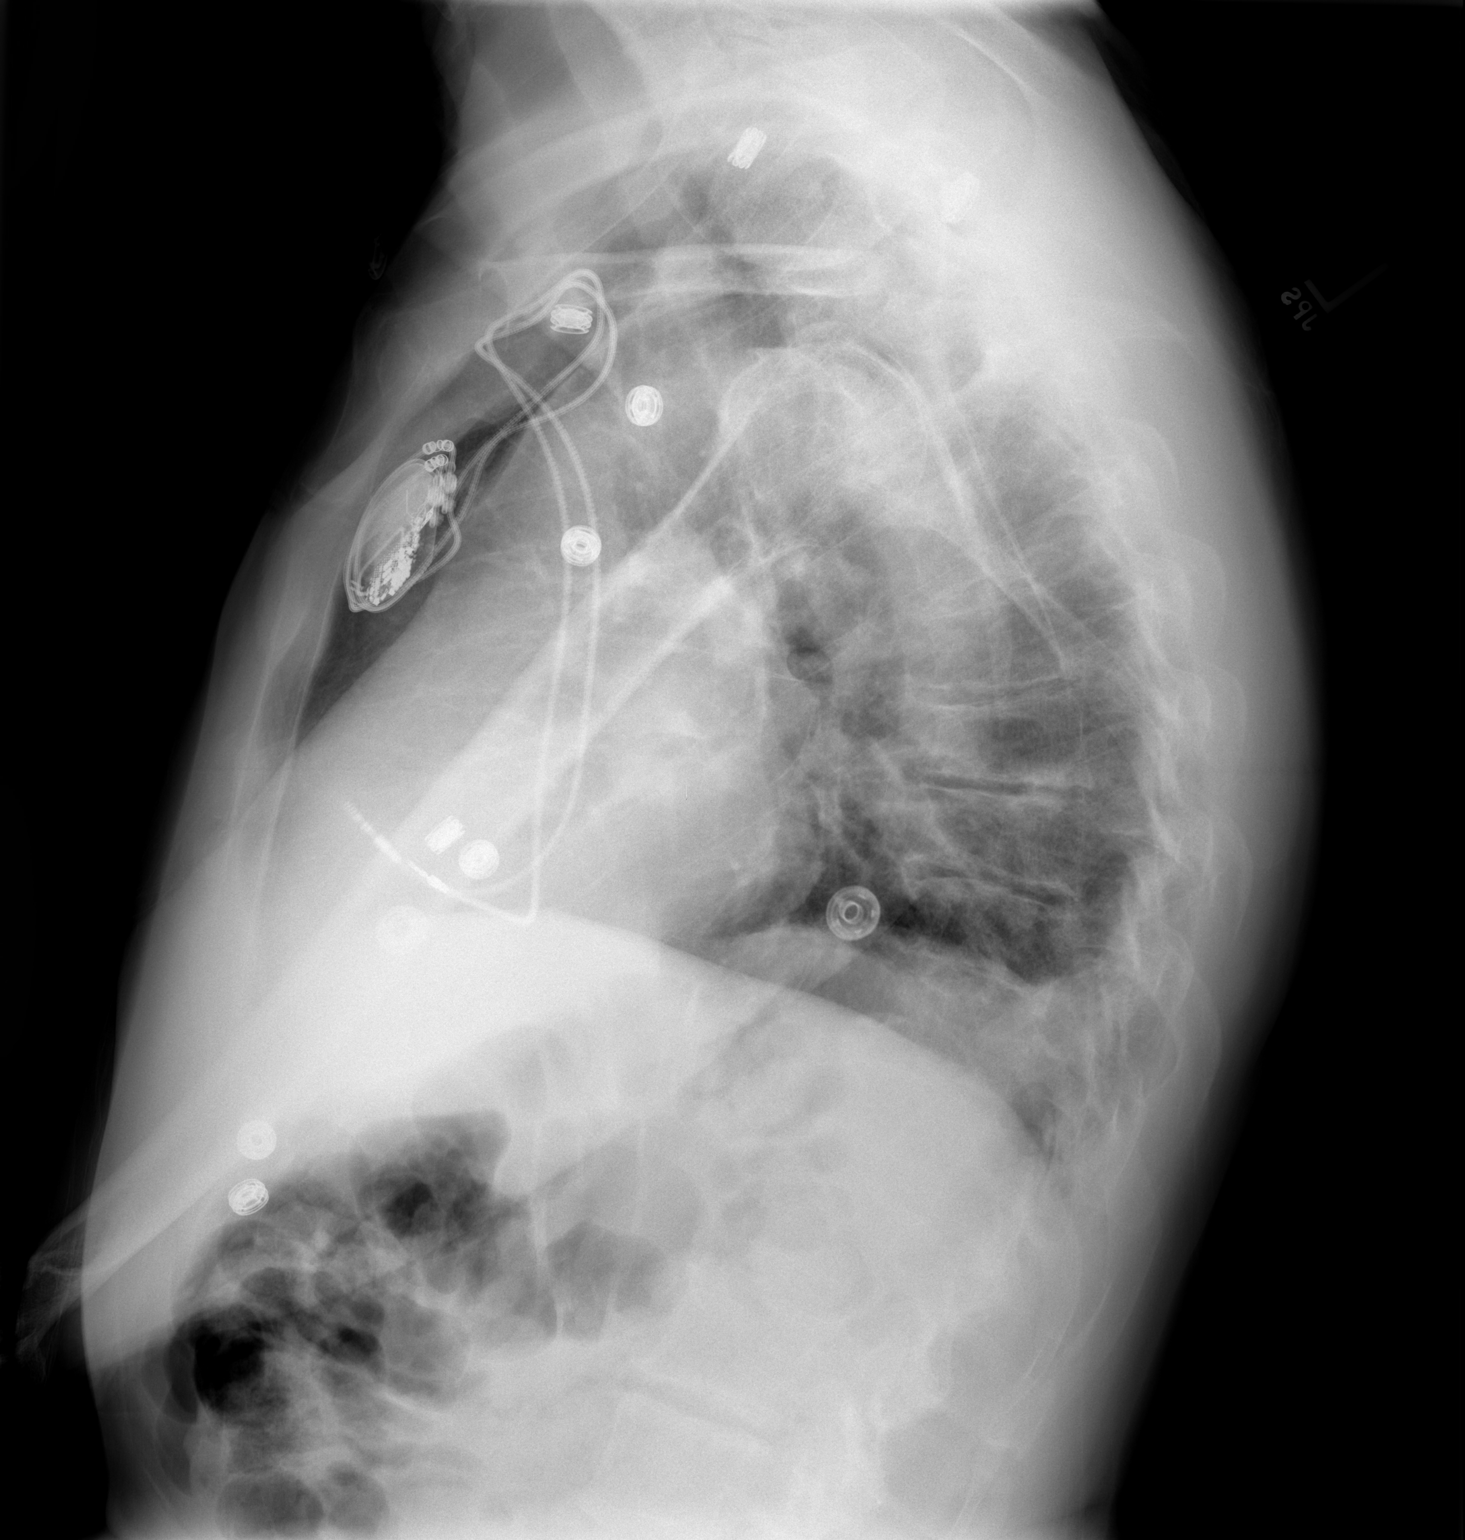

[2 of 2 positions shown; findings below may reference images not displayed]

FINDINGS: A dual lead permanent pacemaker is now present.  No
pneumothorax is seen.  Mild left basilar atelectasis is noted.
Cardiomegaly is stable.  Thoracolumbar scoliosis is noted.
IMPRESSION: Permanent pacemaker now present.  No pneumothorax.

## 2012-09-23 ENCOUNTER — Encounter (HOSPITAL_COMMUNITY): Payer: Medicare Other

## 2012-09-26 ENCOUNTER — Encounter (HOSPITAL_COMMUNITY): Payer: Medicare Other

## 2012-09-28 ENCOUNTER — Encounter (HOSPITAL_COMMUNITY): Payer: Medicare Other

## 2012-09-30 ENCOUNTER — Encounter (HOSPITAL_COMMUNITY): Payer: Medicare Other

## 2012-10-04 ENCOUNTER — Ambulatory Visit: Payer: Medicare Other | Admitting: Cardiology

## 2012-10-24 ENCOUNTER — Ambulatory Visit (INDEPENDENT_AMBULATORY_CARE_PROVIDER_SITE_OTHER): Payer: Medicare Other | Admitting: Cardiology

## 2012-10-24 ENCOUNTER — Encounter: Payer: Self-pay | Admitting: Cardiology

## 2012-10-24 VITALS — BP 126/86 | HR 72 | Ht 72.0 in | Wt 196.0 lb

## 2012-10-24 DIAGNOSIS — E871 Hypo-osmolality and hyponatremia: Secondary | ICD-10-CM

## 2012-10-24 DIAGNOSIS — E785 Hyperlipidemia, unspecified: Secondary | ICD-10-CM

## 2012-10-24 DIAGNOSIS — I2510 Atherosclerotic heart disease of native coronary artery without angina pectoris: Secondary | ICD-10-CM

## 2012-10-24 DIAGNOSIS — I4892 Unspecified atrial flutter: Secondary | ICD-10-CM

## 2012-10-24 LAB — PACEMAKER DEVICE OBSERVATION

## 2012-10-24 LAB — BASIC METABOLIC PANEL
BUN: 13 mg/dL (ref 6–23)
Calcium: 9.2 mg/dL (ref 8.4–10.5)
GFR: 73.73 mL/min (ref >60.00)
Potassium: 4.4 mEq/L (ref 3.5–5.1)
Sodium: 135 mEq/L (ref 135–145)

## 2012-10-24 NOTE — Patient Instructions (Addendum)
Your physician recommends that you have lab work today: [facility]  Your physician recommends that you continue on your current medications as directed. Please refer to the Current Medication list given to you today.  Your physician recommends that you schedule a follow-up appointment in: [facility] 2014

## 2012-10-24 NOTE — Progress Notes (Signed)
HPI:  [name] returns for a follow up visit.  In general, he is perhaps a bit more fatigued since I last saw him.  He denies any typical chest pain.  He did have reprogramming of his AV delay, and was interrogated in the offce today.    Current Outpatient Prescriptions  Medication Sig Dispense Refill  . acyclovir (ZOVIRAX) 200 MG capsule Take 200 mg by mouth as needed. For outbreak      . amLODipine (NORVASC) 5 MG tablet       . aspirin EC 81 MG tablet Take 81 mg by mouth daily.      . cholecalciferol (VITAMIN D) 1000 UNITS tablet Take 1,000 Units by mouth daily.      . clopidogrel (PLAVIX) 75 MG tablet Take 1 tablet (75 mg total) by mouth daily.  30 tablet  6  . nitroGLYCERIN (NITROSTAT) 0.4 MG SL tablet Place 1 tablet (0.4 mg total) under the tongue every 5 (five) minutes as needed for chest pain.  25 tablet  3  . propranolol (INDERAL) 10 MG tablet Take one tablet by mouth three times daily as needed  90 tablet  6  . vitamin B-12 (CYANOCOBALAMIN) 1000 MCG tablet Take 1,000 mcg by mouth daily.        Allergies  Allergen Reactions  . Ceclor (Cefaclor) Hives  . Chlorhexidine Itching and Rash  . Statins Other (See Comments)    Muscle weakness  . Ace Inhibitors Other (See Comments)    Does not take due to "angiotensin is responsible for maintaining bladder tone"    Past Medical History  Diagnosis Date  . Hyperlipidemia   . Pulsatile tinnitus   . 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  . Familial tremor   . Hypertension   . Benign paroxysmal vertigo   . Heart murmur   . Atrial flutter -atypical 2007    ABLATION x2 [facility]  . Atrial tachycardia 2007    ABLATION [facility]  . First degree AV block   . Tachy-brady syndrome     a. developed after RFCA @ [facility] 2013-> MDT PPM;  b. s/p RA lead revision 05/2012  . History of blood transfusion 2010    "2nd day after prostatectomy"  . Migraines   . Prostate cancer   . Carcinoma     "off my chest"  . CAD (coronary  artery disease)     a. abnl mv 01/2012;  b. cath 05/23/2012 LCX 80@ bifurcation w/ OM, 95% @ AV groove, otw nonobs dzs, EF 55-65%;    Past Surgical History  Procedure Date  . Prostatectomy 02/20/2009    COMPLICATED  . Shoulder surgery     Bilateral; "boney spurs removed"  . Atrial ablation surgery 04/01/12    "4th time I had one"  . Colonoscopy   . Cardioversion 12/03/2011    Procedure: CARDIOVERSION;  Surgeon: Duke Salvia, MD;  Location: [facility] OR;  Service: Cardiovascular;  Laterality: [name];  . Cardioversion 01/13/2012    Procedure: CARDIOVERSION;  Surgeon: Lewayne Bunting, MD;  Location: [facility] OR;  Service: Cardiovascular;  Laterality: [name];  . Cardioversion 04/27/2012    Procedure: CARDIOVERSION;  Surgeon: Cassell Clement, MD;  Location: [facility] OR;  Service: Cardiovascular;  Laterality: [name];  . Insert / replace / remove pacemaker 03/2012    initial placement  . Insert / replace / remove pacemaker 05/25/12    "atrial lead change"  . Inguinal hernia repair 1945    left side  .  Hernia repair 1980's  . Tonsillectomy and adenoidectomy     "as a child"  . Skin cancer excision     "in situ; right chest"  . Cardiac catheterization   . Coronary angioplasty with stent placement 06/02/12    "1; first one ever"    Family History  Problem Relation Age of Onset  . Colon cancer Sister     History   Social History  . Marital Status: Married    Spouse Name: [name]    Number of Children: [name]  . Years of Education: [name]   Occupational History  . Not on file.   Social History Main Topics  . Smoking status: Never Smoker   . Smokeless tobacco: Never Used  . Alcohol Use: 1.5 oz/week    3 drink(s) per week     Comment: 05/25/12 "last alcohol 02/2012"  . Drug Use: No  . Sexually Active: Not Currently   Other Topics Concern  . Not on file   Social History Narrative  . No narrative on file    ROS: Please see the HPI.  All other systems reviewed and negative.  PHYSICAL EXAM:  BP 126/86  Pulse  72  Ht 6' (1.829 m)  Wt 196 lb (88.905 kg)  BMI 26.58 kg/m2  SpO2 99%  General: Well developed, well nourished, in no acute distress. Head:  Normocephalic and atraumatic. Neck: no JVD Lungs: Clear to auscultation and percussion. Heart: Normal S1 and S2.  No murmur, rubs or gallops.  Extremities: No clubbing or cyanosis. No edema. Neurologic: Alert and oriented x 3.  EKG:  A pacing.  V pacing and pseudofusion.   ASSESSMENT AND PLAN:

## 2012-11-06 NOTE — Assessment & Plan Note (Signed)
Defer to primary care 

## 2012-11-06 NOTE — Assessment & Plan Note (Signed)
He is perhaps more fatigued, but he has not had typically ischemic symptoms despite side branch closure.  As noted, I would favor just 8-9 months of DAPT, then move to antithrombin with ASA alone as PRU suggests very responsive to plavix, thus perhaps (Trials split on this issue) increased bleeding risk with clopidogrel and antithrombin.

## 2012-11-06 NOTE — Assessment & Plan Note (Signed)
Case was discussed with Dr. Graciela Husbands.  At present, he is currently paced after making changes in his AV delay.  He feels more fatigued at present.  This may need to be adjusted.  Will also discussed his currently level of anticoagulation, and the options.   Because his PRU is so responsive, my leaning would be in the direction of medical therapy with combination of Eliquis perhaps or Xarelto, and use of low dose ASA as the antiplatelet agent of choice.  We will monitor him clinically through nine months and Dr. Graciela Husbands will address the issue of his pacing.

## 2012-11-06 NOTE — Assessment & Plan Note (Signed)
Recheck BMET 

## 2012-11-25 ENCOUNTER — Ambulatory Visit (INDEPENDENT_AMBULATORY_CARE_PROVIDER_SITE_OTHER): Payer: Medicare Other | Admitting: Internal Medicine

## 2012-11-25 ENCOUNTER — Encounter: Payer: Self-pay | Admitting: Internal Medicine

## 2012-11-25 VITALS — BP 133/85 | HR 85 | Ht 72.0 in | Wt 195.8 lb

## 2012-11-25 DIAGNOSIS — I440 Atrioventricular block, first degree: Secondary | ICD-10-CM

## 2012-11-25 DIAGNOSIS — I2510 Atherosclerotic heart disease of native coronary artery without angina pectoris: Secondary | ICD-10-CM

## 2012-11-25 DIAGNOSIS — R0609 Other forms of dyspnea: Secondary | ICD-10-CM

## 2012-11-25 DIAGNOSIS — I498 Other specified cardiac arrhythmias: Secondary | ICD-10-CM

## 2012-11-25 DIAGNOSIS — Z950 Presence of cardiac pacemaker: Secondary | ICD-10-CM

## 2012-11-25 DIAGNOSIS — I471 Supraventricular tachycardia: Secondary | ICD-10-CM

## 2012-11-25 LAB — PACEMAKER DEVICE OBSERVATION
AL AMPLITUDE: 4.125 mv
AL IMPEDENCE PM: 513 Ohm
AL THRESHOLD: 0.875 V
RV LEAD AMPLITUDE: 12.625 mv

## 2012-11-25 NOTE — Assessment & Plan Note (Signed)
Ongoing. We will continue his current anticoagulation regime and when he is Plavix is up in a couple of months we will begin him on a NOAC

## 2012-11-25 NOTE — Assessment & Plan Note (Signed)
This is prompted reprogramming of his device to a DDD mode. This has helped with his symptoms.

## 2012-11-25 NOTE — Patient Instructions (Signed)
Your physician has requested that you have an echocardiogram. Echocardiography is a painless test that uses sound waves to create images of your heart. It provides your doctor with information about the size and shape of your heart and how well your heart's chambers and valves are working. This procedure takes approximately one hour. There are no restrictions for this procedure.  Your physician wants you to follow-up in: 6 months with Dr. Klein. You will receive a reminder letter in the mail two months in advance. If you don't receive a letter, please call our office to schedule the follow-up appointment.  

## 2012-11-25 NOTE — Assessment & Plan Note (Addendum)
This may be related to programming. We'll increase the heart rate. We do this with some trepidation in patients with prior stenting and coronary disease because we worry about increased oxygen demand with faster heart rates. He has been having atypical chest pains. They have been presumed not to be related to ischemia as air focused physically around his pacemaker site and are not exertional. In the event that they worsen with this reprogramming change we'll need to consider reevaluation of his coronary anatomy.  In the interim because of enhanced right ventricular apical pacing and his worsening dyspnea will get an echo.

## 2012-11-25 NOTE — Assessment & Plan Note (Signed)
As above.

## 2012-11-25 NOTE — Progress Notes (Signed)
[name] Patient Care Team: Darnelle Bos, MD as PCP - General (Internal Medicine)   HPI  [name] is a 67 y.o. male Seen in followup for atrial arrhythmias for which he takes no antiarrhythmic currently. He is on aspirin and Plavix for another couple of months. He has has problems with symptomatic palpitations related to long AV delays and felt much better with a shortening of his AV delay. Unfortunately he still complains of significant exercise intolerance which is worsened. Interrogation of his pacemaker today demonstrates a max sensor rate and a max tracking rate of 100 beats per minute. He also is atrial flutter at the rate of about 350-60 ms  He has some vague episodes of atypical chest pain mostly related anatomically to his pacemaker site to which time he also dates them.  Past Medical History  Diagnosis Date  . Hyperlipidemia   . Pulsatile tinnitus   . 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  . Familial tremor   . Hypertension   . Benign paroxysmal vertigo   . Heart murmur   . Atrial flutter -atypical 2007    ABLATION x2 [facility]  . Atrial tachycardia 2007    ABLATION [facility]  . First degree AV block   . Tachy-brady syndrome     a. developed after RFCA @ [facility] 2013-> MDT PPM;  b. s/p RA lead revision 05/2012  . History of blood transfusion 2010    "2nd day after prostatectomy"  . Migraines   . Prostate cancer   . Carcinoma     "off my chest"  . CAD (coronary artery disease)     a. abnl mv 01/2012;  b. cath 05/23/2012 LCX 80@ bifurcation w/ OM, 95% @ AV groove, otw nonobs dzs, EF 55-65%;    Past Surgical History  Procedure Date  . Prostatectomy 02/20/2009    COMPLICATED  . Shoulder surgery     Bilateral; "boney spurs removed"  . Atrial ablation surgery 04/01/12    "4th time I had one"  . Colonoscopy   . Cardioversion 12/03/2011    Procedure: CARDIOVERSION;  Surgeon: Duke Salvia, MD;  Location: [facility] OR;  Service: Cardiovascular;  Laterality:  [name];  . Cardioversion 01/13/2012    Procedure: CARDIOVERSION;  Surgeon: Lewayne Bunting, MD;  Location: [facility] OR;  Service: Cardiovascular;  Laterality: [name];  . Cardioversion 04/27/2012    Procedure: CARDIOVERSION;  Surgeon: Cassell Clement, MD;  Location: [facility] OR;  Service: Cardiovascular;  Laterality: [name];  . Insert / replace / remove pacemaker 03/2012    initial placement  . Insert / replace / remove pacemaker 05/25/12    "atrial lead change"  . Inguinal hernia repair 1945    left side  . Hernia repair 1980's  . Tonsillectomy and adenoidectomy     "as a child"  . Skin cancer excision     "in situ; right chest"  . Cardiac catheterization   . Coronary angioplasty with stent placement 06/02/12    "1; first one ever"    Current Outpatient Prescriptions  Medication Sig Dispense Refill  . acyclovir (ZOVIRAX) 200 MG capsule Take 200 mg by mouth as needed. For outbreak      . amLODipine (NORVASC) 5 MG tablet       . aspirin EC 81 MG tablet Take 81 mg by mouth daily.      . cholecalciferol (VITAMIN D) 1000 UNITS tablet Take 1,000 Units by mouth daily.      . clopidogrel (PLAVIX)  75 MG tablet Take 1 tablet (75 mg total) by mouth daily.  30 tablet  6  . nitroGLYCERIN (NITROSTAT) 0.4 MG SL tablet Place 1 tablet (0.4 mg total) under the tongue every 5 (five) minutes as needed for chest pain.  25 tablet  3  . propranolol (INDERAL) 10 MG tablet Take one tablet by mouth three times daily as needed  90 tablet  6  . vitamin B-12 (CYANOCOBALAMIN) 1000 MCG tablet Take 1,000 mcg by mouth daily.        Allergies  Allergen Reactions  . Ceclor (Cefaclor) Hives  . Chlorhexidine Itching and Rash  . Statins Other (See Comments)    Muscle weakness  . Ace Inhibitors Other (See Comments)    Does not take due to "angiotensin is responsible for maintaining bladder tone"    Review of Systems negative except from HPI and PMH  Physical Exam BP 133/85  Pulse 85  Ht 6' (1.829 m)  Wt 195 lb 12.8 oz (88.814 kg)   BMI 26.56 kg/m2 Well developed and well nourished in no acute distress HENT normal E scleral and icterus clear Neck Supple JVP flat; carotids brisk and full Clear to ausculation  *Regular rate and rhythm, no murmurs gallops or rub Soft with active bowel sounds No clubbing cyanosis  none Edema Alert and oriented, grossly normal motor and sensory function Skin Warm and Dry  ECG demonstrates AV pacing  Assessment and  Plan

## 2012-11-26 NOTE — Assessment & Plan Note (Signed)
Device was reprogrammed as noted above to increase a max sensor rate and a max tracking rate. I reviewed extensively with the patient basis for his changes

## 2012-12-01 ENCOUNTER — Ambulatory Visit (HOSPITAL_COMMUNITY): Payer: Medicare Other | Attending: Internal Medicine | Admitting: Radiology

## 2012-12-01 DIAGNOSIS — I2510 Atherosclerotic heart disease of native coronary artery without angina pectoris: Secondary | ICD-10-CM | POA: Insufficient documentation

## 2012-12-01 DIAGNOSIS — I059 Rheumatic mitral valve disease, unspecified: Secondary | ICD-10-CM

## 2012-12-01 DIAGNOSIS — I471 Supraventricular tachycardia: Secondary | ICD-10-CM

## 2012-12-01 DIAGNOSIS — I080 Rheumatic disorders of both mitral and aortic valves: Secondary | ICD-10-CM | POA: Insufficient documentation

## 2012-12-01 DIAGNOSIS — I440 Atrioventricular block, first degree: Secondary | ICD-10-CM

## 2012-12-01 DIAGNOSIS — I379 Nonrheumatic pulmonary valve disorder, unspecified: Secondary | ICD-10-CM | POA: Insufficient documentation

## 2012-12-01 DIAGNOSIS — R000 Tachycardia, unspecified: Secondary | ICD-10-CM | POA: Insufficient documentation

## 2012-12-01 DIAGNOSIS — I10 Essential (primary) hypertension: Secondary | ICD-10-CM | POA: Insufficient documentation

## 2012-12-01 DIAGNOSIS — I369 Nonrheumatic tricuspid valve disorder, unspecified: Secondary | ICD-10-CM | POA: Insufficient documentation

## 2012-12-01 NOTE — Progress Notes (Signed)
Echocardiogram performed.  

## 2012-12-19 IMAGING — CT CT HEAD W/O CM
2 series · 16 of 30 positions shown, 20 images · non-contrast
Comparison: MRI 02/11/2010.

CLINICAL DATA: Headache.  Decreased sodium levels.  History of
prostate cancer.

CT HEAD WITHOUT CONTRAST
TECHNIQUE: Contiguous axial images were obtained from the base of
the skull through the vertex without contrast.

[Series 3: head bone · axial · 0.49mm/px · z∈[+32,+79]mm · 3 of 32 slices shown]
[im 3/32  bone]
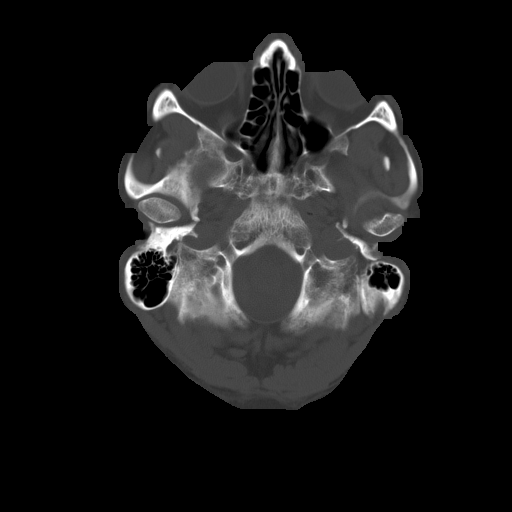
[im 7/32  bone]
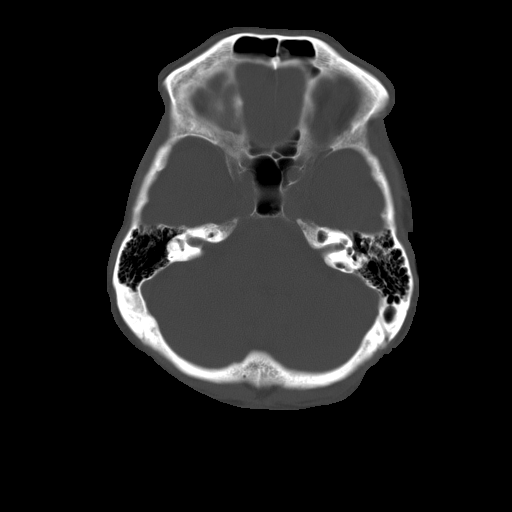
[im 12/32  bone]
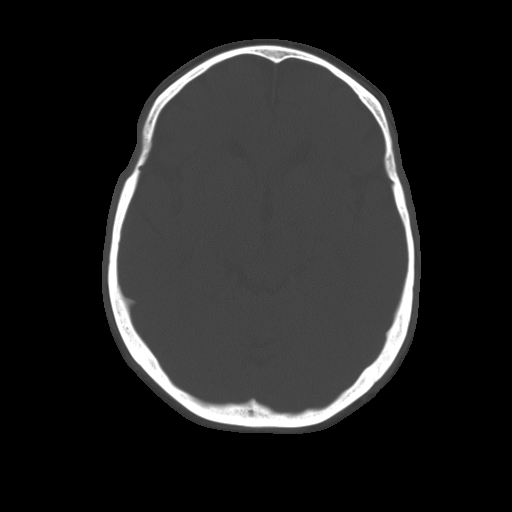

[Series 32: 3d filtered head w/o · axial · non-contrast · 0.49mm/px · z∈[+32,+169]mm · 13 of 32 slices shown, 17 images]
[im 3/32  brain]
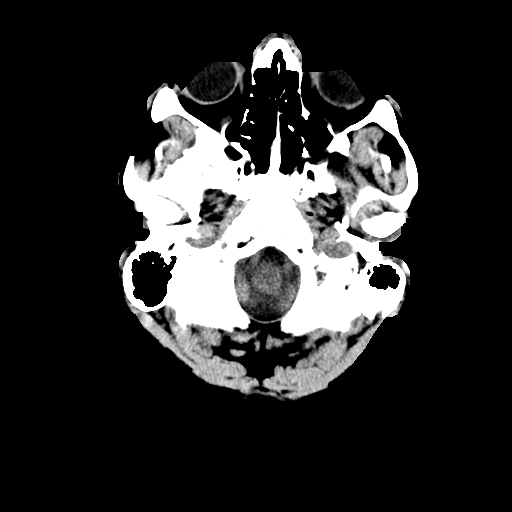
[im 3/32  bone]
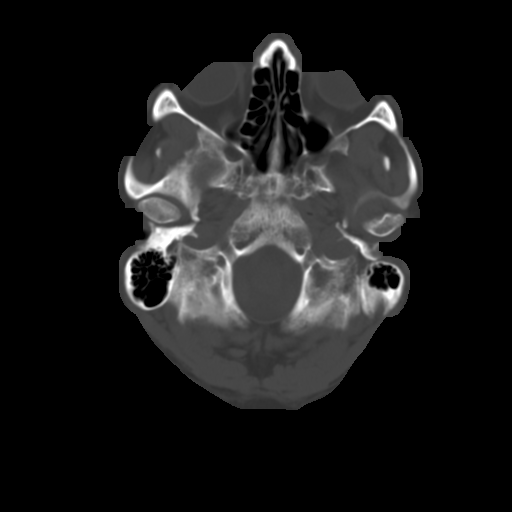
[im 5/32  brain]
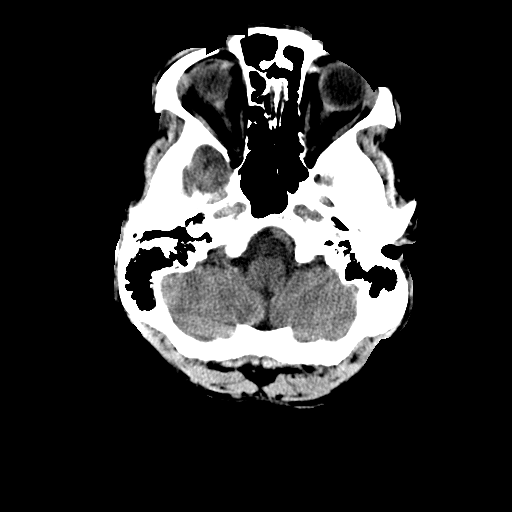
[im 7/32  brain]
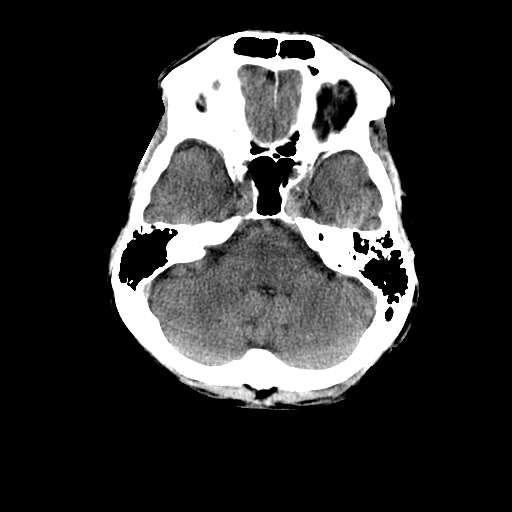
[im 9/32  brain]
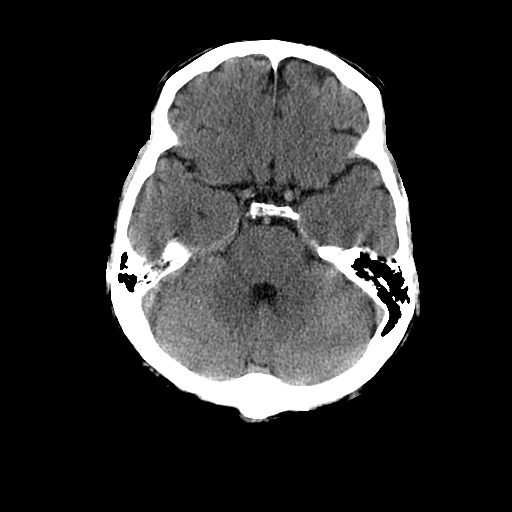
[im 12/32  brain]
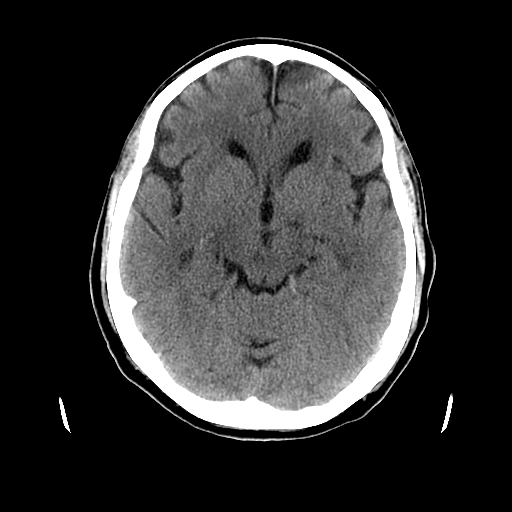
[im 12/32  bone]
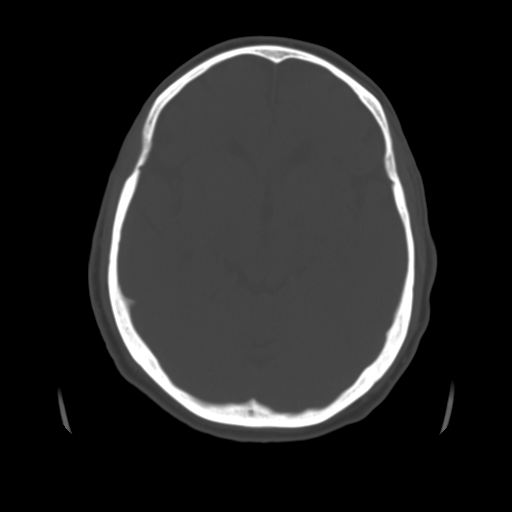
[im 14/32  brain]
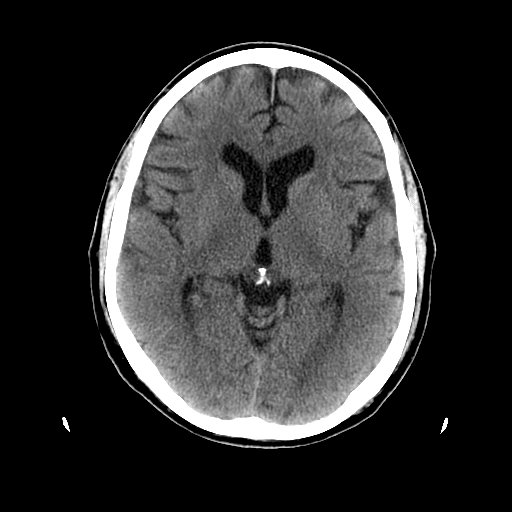
[im 16/32  brain]
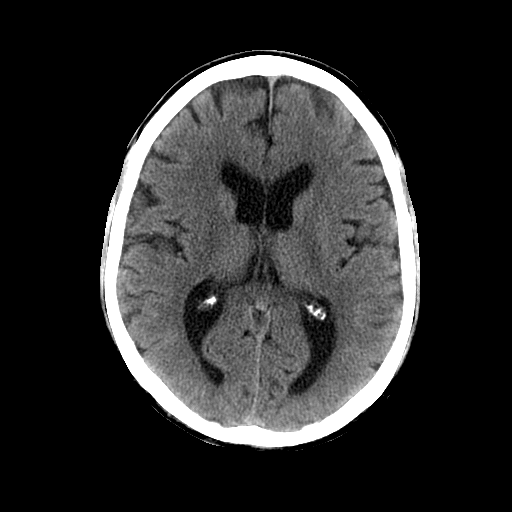
[im 18/32  brain]
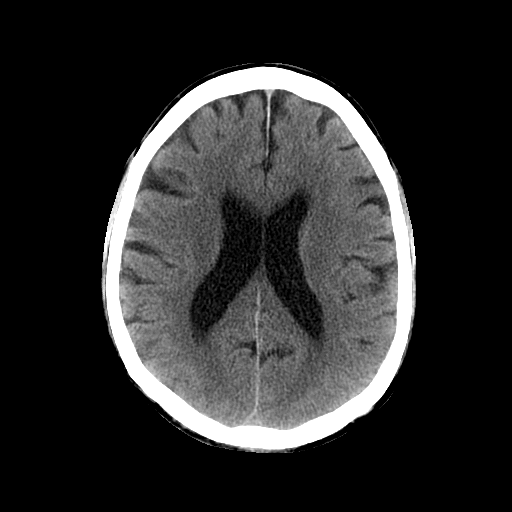
[im 20/32  brain]
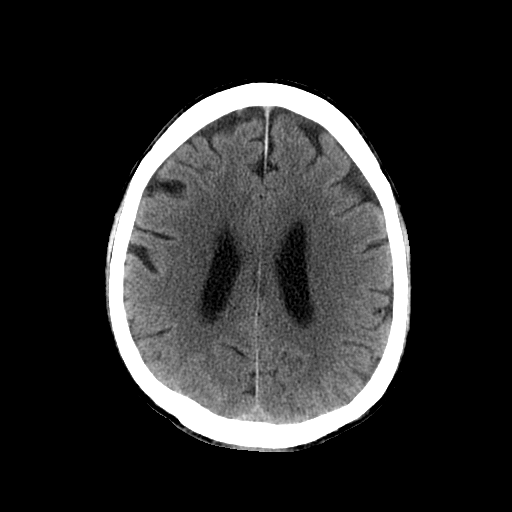
[im 20/32  bone]
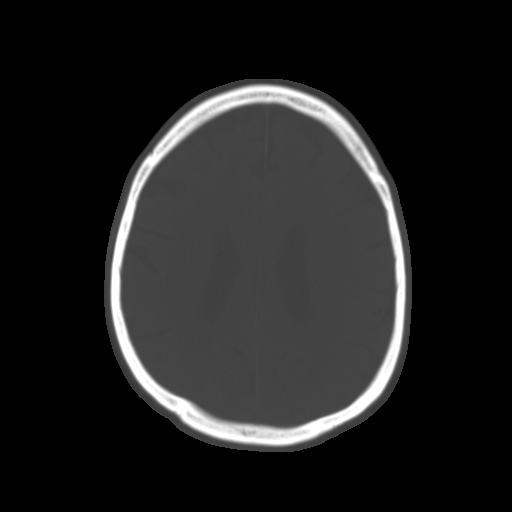
[im 23/32  brain]
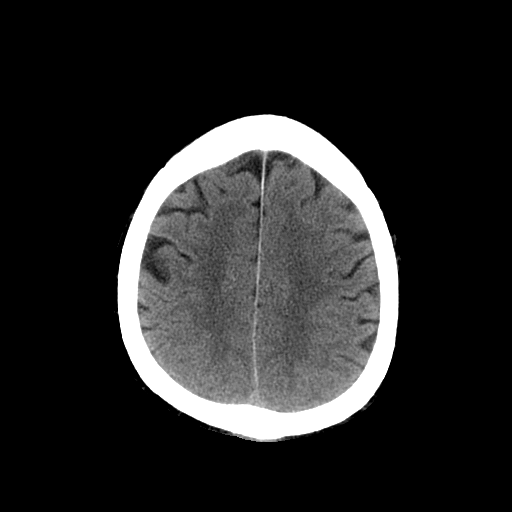
[im 25/32  brain]
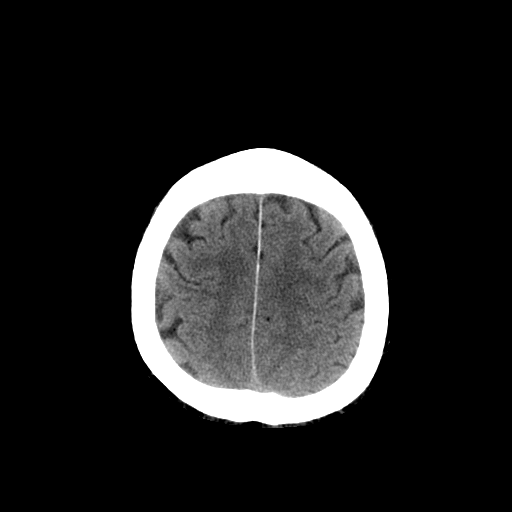
[im 27/32  brain]
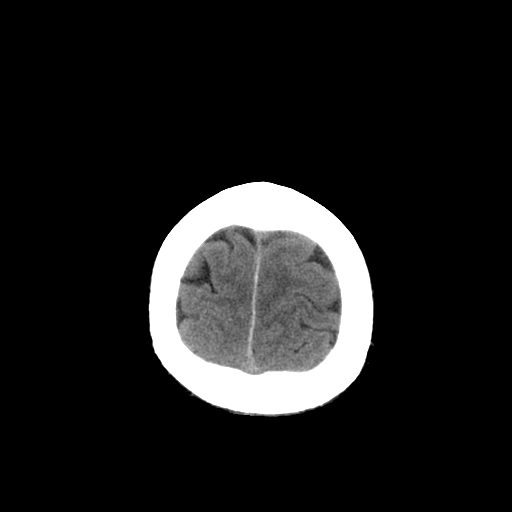
[im 29/32  brain]
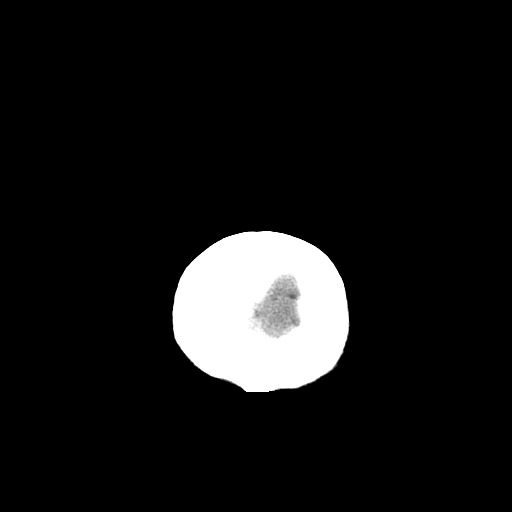
[im 29/32  bone]
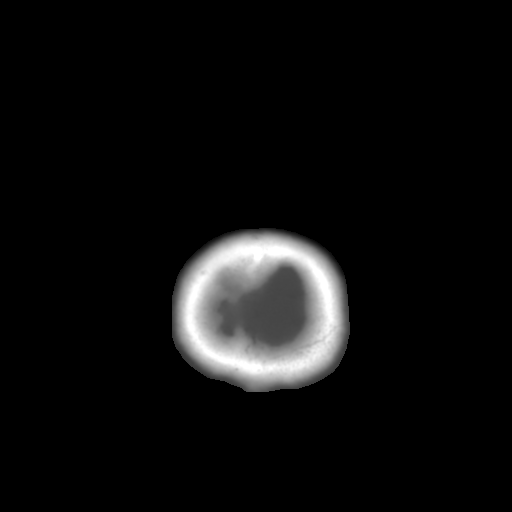

[16 of 30 positions shown; findings below may reference images not displayed]

FINDINGS: No mass lesion, mass effect, midline shift,
hydrocephalus, hemorrhage.  No territorial ischemia or acute
infarction.  Mastoid air cells normal.  A tiny amount of mucosal
thickening is present in the left frontal sinus.
IMPRESSION: Negative CT head.

## 2012-12-21 ENCOUNTER — Encounter: Payer: Self-pay | Admitting: Internal Medicine

## 2013-01-16 ENCOUNTER — Ambulatory Visit (INDEPENDENT_AMBULATORY_CARE_PROVIDER_SITE_OTHER): Payer: Medicare Other | Admitting: Cardiology

## 2013-01-16 ENCOUNTER — Encounter: Payer: Self-pay | Admitting: Cardiology

## 2013-01-16 VITALS — BP 110/72 | HR 74 | Ht 72.0 in | Wt 197.0 lb

## 2013-01-16 DIAGNOSIS — I2510 Atherosclerotic heart disease of native coronary artery without angina pectoris: Secondary | ICD-10-CM

## 2013-01-16 DIAGNOSIS — I4892 Unspecified atrial flutter: Secondary | ICD-10-CM

## 2013-01-16 DIAGNOSIS — I10 Essential (primary) hypertension: Secondary | ICD-10-CM

## 2013-01-16 MED ORDER — RIVAROXABAN 20 MG PO TABS: 20.0000 mg | ORAL_TABLET | Freq: Every day | ORAL | Status: DC

## 2013-01-16 NOTE — Assessment & Plan Note (Signed)
See Dr. Koren Bound prior note.  He has had antiplatelets now for more than 6 months.  We have discussed options.  He has a second gen stent and several risk factors for stroke.  He is a hyperresponder by P2Y12 testing, so would favor ASA and a novel anticoagulant.  He has taken Xarelto before and we will start this after three days of stopping clopidogrel.  No new symtpoms.

## 2013-01-16 NOTE — Patient Instructions (Addendum)
Your physician has recommended you make the following change in your medication: STOP Plavix, Please start Xarelto 20mg  take one by mouth daily with your evening meal (START Friday 01/20/13)  Your physician wants you to follow-up in: 6 MONTHS with Dr Excell Seltzer (previous pt of Dr Riley Kill).  You will receive a reminder letter in the mail two months in advance. If you don't receive a letter, please call our office to schedule the follow-up appointment.

## 2013-01-16 NOTE — Assessment & Plan Note (Signed)
Seems to be stable.  Had side branch occlusion with collaterals, could be source of occasional chest pain.

## 2013-01-16 NOTE — Progress Notes (Signed)
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 He occasionally has episodes of chest discomfort, but they seemed to be almost never related to exertion. He awoke one night and did take a nitroglycerin which helped the discomfort. He seen Dr. Graciela Husbands. He is noted to be in atrial flutter today with backup pacing. He's not having any symptoms today. He does note lightheadedness from time to time.  His head bothers him at times----feels like pounding when he drives and hits a hard bump.   Current Outpatient Prescriptions  Medication Sig Dispense Refill  . acyclovir (ZOVIRAX) 200 MG capsule Take 200 mg by mouth as needed. For outbreak      . amLODipine (NORVASC) 5 MG tablet       . aspirin EC 81 MG tablet Take 81 mg by mouth daily.      . cholecalciferol (VITAMIN D) 1000 UNITS tablet Take 1,000 Units by mouth daily.      . nitroGLYCERIN (NITROSTAT) 0.4 MG SL tablet Place 1 tablet (0.4 mg total) under the tongue every 5 (five) minutes as needed for chest pain.  25 tablet  3  . propranolol (INDERAL) 10 MG tablet Take one tablet by mouth three times daily as needed  90 tablet  6  . vitamin B-12 (CYANOCOBALAMIN) 1000 MCG tablet Take 1,000 mcg by mouth daily.      . Rivaroxaban (XARELTO) 20 MG TABS Take 1 tablet (20 mg total) by mouth daily with supper.  30 tablet  11   No current facility-administered medications for this visit.    Allergies  Allergen Reactions  . Ceclor (Cefaclor) Hives  . Chlorhexidine Itching and Rash  . Statins Other (See Comments)    Muscle weakness  . Ace Inhibitors Other (See Comments)    Does not take due to "angiotensin is responsible for maintaining bladder tone"    Past Medical History  Diagnosis Date  . Hyperlipidemia   . Pulsatile tinnitus   . 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  . Familial tremor   . Hypertension   . Benign paroxysmal vertigo   . Heart murmur   . Atrial flutter -atypical 2007    ABLATION x2 [facility]  . Atrial tachycardia  2007    ABLATION [facility]  . First degree AV block   . Tachy-brady syndrome     a. developed after RFCA @ [facility] 2013-> MDT [facility]PM;  b. s/p RA lead revision 05/2012  . History of blood transfusion 2010    "2nd day after prostatectomy"  . Migraines   . Prostate cancer   . Carcinoma     "off my chest"  . CAD (coronary artery disease)     a. abnl mv 01/2012;  b. cath 05/23/2012 LCX 80@ bifurcation w/ OM, 95% @ AV groove, otw nonobs dzs, EF 55-65%;    Past Surgical History  Procedure Laterality Date  . Prostatectomy  02/20/2009    COMPLICATED  . Shoulder surgery      Bilateral; "boney spurs removed"  . Atrial ablation surgery  04/01/12    "4th time I had one"  . Colonoscopy    . Cardioversion  12/03/2011    Procedure: CARDIOVERSION;  Surgeon: Duke Salvia, MD;  Location: [facility] OR;  Service: Cardiovascular;  Laterality: [name];  . Cardioversion  01/13/2012    Procedure: CARDIOVERSION;  Surgeon: Lewayne Bunting, MD;  Location: [facility] OR;  Service: Cardiovascular;  Laterality: [name];  . Cardioversion  04/27/2012    Procedure: CARDIOVERSION;  Surgeon: Cassell Clement, MD;  Location: [facility] OR;  Service: Cardiovascular;  Laterality: [name];  . Insert / replace / remove pacemaker  03/2012    initial placement  . Insert / replace / remove pacemaker  05/25/12    "atrial lead change"  . Inguinal hernia repair  1945    left side  . Hernia repair  1980's  . Tonsillectomy and adenoidectomy      "as a child"  . Skin cancer excision      "in situ; right chest"  . Cardiac catheterization    . Coronary angioplasty with stent placement  06/02/12    "1; first one ever"    Family History  Problem Relation Age of Onset  . Colon cancer Sister     History   Social History  . Marital Status: Married    Spouse Name: [name]    Number of Children: [name]  . Years of Education: [name]   Occupational History  . Not on file.   Social History Main Topics  . Smoking status: Never Smoker   . Smokeless tobacco: Never Used  . Alcohol  Use: 1.5 oz/week    3 drink(s) per week     Comment: 05/25/12 "last alcohol 02/2012"  . Drug Use: No  . Sexually Active: Not Currently   Other Topics Concern  . Not on file   Social History Narrative  . No narrative on file    ROS: Please see the HPI.  All other systems reviewed and negative.  PHYSICAL EXAM:  BP 110/72  Pulse 74  Ht 6' (1.829 m)  Wt 197 lb (89.359 kg)  BMI 26.71 kg/m2  SpO2 99%  General: Well developed, well nourished, in no acute distress. Head:  Normocephalic and atraumatic. Neck: no JVD Lungs: Clear to auscultation and percussion. Heart: Normal S1 and S2.  No murmur, rubs or gallops.  Abdomen:  Normal bowel sounds; soft; non tender; no organomegaly Extremities: No clubbing or cyanosis. No edema. Neurologic: Alert and oriented x 3.  EKG:  Atrial flutter and ventricular pacing.    ASSESSMENT AND PLAN:

## 2013-01-16 NOTE — Assessment & Plan Note (Signed)
Well controlled at present

## 2013-02-01 ENCOUNTER — Other Ambulatory Visit: Payer: Self-pay | Admitting: Dermatology

## 2013-03-14 ENCOUNTER — Telehealth: Payer: Self-pay | Admitting: Internal Medicine

## 2013-03-14 NOTE — Telephone Encounter (Signed)
New problem    Patient calling   1. Does he need any follow up appt. Please advise

## 2013-03-14 NOTE — Telephone Encounter (Signed)
Spoke with pt, 6 month follow up appt scheduled

## 2013-03-29 ENCOUNTER — Encounter (INDEPENDENT_AMBULATORY_CARE_PROVIDER_SITE_OTHER): Payer: Self-pay | Admitting: Surgery

## 2013-03-29 ENCOUNTER — Ambulatory Visit (INDEPENDENT_AMBULATORY_CARE_PROVIDER_SITE_OTHER): Payer: Medicare Other | Admitting: Surgery

## 2013-03-29 VITALS — BP 124/81 | HR 75 | Temp 97.4°F | Resp 16 | Ht 72.0 in | Wt 192.4 lb

## 2013-03-29 DIAGNOSIS — R109 Unspecified abdominal pain: Secondary | ICD-10-CM

## 2013-03-29 NOTE — Progress Notes (Signed)
Patient ID: [name], male   DOB: 04/28/1946, 67 y.o.   MRN: [id number]  Chief Complaint  Patient presents with  . Abdominal Pain    HPI [name] is a 67 y.o. male.   HPI This is a pleasant gentleman who presents with abdominal pain, bloating, and intermittent nausea. This initially started in his right groin pain. This is been going on for about 5 weeks. He now describes bloating and upper abdominal pain which is intermittent. He is passing flatus and having bowel movements. Most the pain occurs in the morning and is cramping in nature. Past Medical History  Diagnosis Date  . Hyperlipidemia   . Pulsatile tinnitus   . 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  . Familial tremor   . Hypertension   . Benign paroxysmal vertigo   . Heart murmur   . Atrial flutter -atypical 2007    ABLATION x2 [facility]  . Atrial tachycardia 2007    ABLATION [facility]  . First degree AV block   . Tachy-brady syndrome     a. developed after RFCA @ [facility] 2013-> MDT PPM;  b. s/p RA lead revision 05/2012  . History of blood transfusion 2010    "2nd day after prostatectomy"  . Migraines   . Prostate cancer   . Carcinoma     "off my chest"  . CAD (coronary artery disease)     a. abnl mv 01/2012;  b. cath 05/23/2012 LCX 80@ bifurcation w/ OM, 95% @ AV groove, otw nonobs dzs, EF 55-65%;    Past Surgical History  Procedure Laterality Date  . Prostatectomy  02/20/2009    COMPLICATED  . Shoulder surgery      Bilateral; "boney spurs removed"  . Atrial ablation surgery  04/01/12    "4th time I had one"  . Colonoscopy    . Cardioversion  12/03/2011    Procedure: CARDIOVERSION;  Surgeon: Duke Salvia, MD;  Location: [facility] OR;  Service: Cardiovascular;  Laterality: [name];  . Cardioversion  01/13/2012    Procedure: CARDIOVERSION;  Surgeon: Lewayne Bunting, MD;  Location[facility] OR;  Service: Cardiovascular;  Laterality: [name];  . Cardioversion  04/27/2012    Procedure: CARDIOVERSION;  Surgeon: Cassell Clement, MD;  Location: [facility] OR;  Service: Cardiovascular;  Laterality: [name];  . Insert / replace / remove pacemaker  03/2012    initial placement  . Insert / replace / remove pacemaker  05/25/12    "atrial lead change"  . Inguinal hernia repair  1945    left side  . Hernia repair  1980's  . Tonsillectomy and adenoidectomy      "as a child"  . Skin cancer excision      "in situ; right chest"  . Cardiac catheterization    . Coronary angioplasty with stent placement  06/02/12    "1; first one ever"    Family History  Problem Relation Age of Onset  . Colon cancer Sister     Social History History  Substance Use Topics  . Smoking status: Never Smoker   . Smokeless tobacco: Never Used  . Alcohol Use: 1.5 oz/week    3 drink(s) per week     Comment: 05/25/12 "last alcohol 02/2012"    Allergies  Allergen Reactions  . Ceclor (Cefaclor) Hives  . Chlorhexidine Itching and Rash  . Statins Other (See Comments)    Muscle weakness  . Ace Inhibitors Other (See Comments)    Does not  take due to "angiotensin is responsible for maintaining bladder tone"    Current Outpatient Prescriptions  Medication Sig Dispense Refill  . acyclovir (ZOVIRAX) 200 MG capsule Take 200 mg by mouth as needed. For outbreak      . amLODipine (NORVASC) 5 MG tablet       . aspirin EC 81 MG tablet Take 81 mg by mouth daily.      . cholecalciferol (VITAMIN D) 1000 UNITS tablet Take 1,000 Units by mouth daily.      . nitroGLYCERIN (NITROSTAT) 0.4 MG SL tablet Place 1 tablet (0.4 mg total) under the tongue every 5 (five) minutes as needed for chest pain.  25 tablet  3  . propranolol (INDERAL) 10 MG tablet Take one tablet by mouth three times daily as needed  90 tablet  6  . Rivaroxaban (XARELTO) 20 MG TABS Take 1 tablet (20 mg total) by mouth daily with supper.  30 tablet  11  . vitamin B-12 (CYANOCOBALAMIN) 1000 MCG tablet Take 1,000 mcg by mouth daily.       No current facility-administered medications for this  visit.    Review of Systems Review of Systems  Constitutional: Negative for fever, chills and unexpected weight change.  HENT: Negative for hearing loss, congestion, sore throat, trouble swallowing and voice change.   Eyes: Negative for visual disturbance.  Respiratory: Negative for cough and wheezing.   Cardiovascular: Positive for palpitations. Negative for chest pain and leg swelling.  Gastrointestinal: Positive for nausea, abdominal pain and abdominal distention. Negative for vomiting, diarrhea, constipation, blood in stool, anal bleeding and rectal pain.  Genitourinary: Positive for difficulty urinating. Negative for hematuria.  Musculoskeletal: Negative for arthralgias.  Skin: Negative for rash and wound.  Neurological: Positive for headaches. Negative for seizures, syncope and weakness.  Hematological: Negative for adenopathy. Does not bruise/bleed easily.  Psychiatric/Behavioral: Negative for confusion.    Blood pressure 124/81, pulse 75, temperature 97.4 F (36.3 C), temperature source Temporal, resp. rate 16, height 6' (1.829 m), weight 192 lb 6.4 oz (87.272 kg).  Physical Exam Physical Exam  Constitutional: He is oriented to person, place, and time. He appears well-developed and well-nourished. No distress.  HENT:  Head: Normocephalic and atraumatic.  Right Ear: External ear normal.  Left Ear: External ear normal.  Nose: Nose normal.  Mouth/Throat: No oropharyngeal exudate.  Eyes: Conjunctivae are normal. Pupils are equal, round, and reactive to light. No scleral icterus.  Neck: Normal range of motion.  Cardiovascular: Normal rate, regular rhythm and intact distal pulses.   No murmur heard. Pulmonary/Chest: Breath sounds normal. No respiratory distress. He has no wheezes.  Abdominal: Soft. Bowel sounds are normal.  Currently, his abdomen is nondistended and nontender. He has a well-healed incision at the umbilicus as well as left groin and suprapubic. With both lying  and standing, I cannot demonstrate a hernia  Neurological: He is alert and oriented to person, place, and time.  Skin: Skin is warm and dry. He is not diaphoretic.  Psychiatric: His behavior is normal. Judgment normal.    Data Reviewed   Assessment    Abdominal pain of uncertain etiology     Plan    His symptoms sound like a partial small bowel obstruction. I'm going to get a CAT scan of his abdomen and pelvis to see if this can be identified. I will see him back after the CAT scan of the abdomen and pelvis is complete.        Maleek Craver A 03/29/2013, 2:22  PM

## 2013-03-30 ENCOUNTER — Ambulatory Visit
Admission: RE | Admit: 2013-03-30 | Discharge: 2013-03-30 | Disposition: A | Discharge status: Home / Self Care | Payer: Medicare Other | Source: Ambulatory Visit | Attending: Surgery | Admitting: Surgery

## 2013-03-30 DIAGNOSIS — R109 Unspecified abdominal pain: Secondary | ICD-10-CM

## 2013-03-30 MED ORDER — IOHEXOL 300 MG/ML  SOLN
100.0000 mL | Freq: Once | INTRAMUSCULAR | Status: AC | PRN
  Administered 2013-03-30: 100 mL via INTRAVENOUS

## 2013-05-11 ENCOUNTER — Ambulatory Visit (INDEPENDENT_AMBULATORY_CARE_PROVIDER_SITE_OTHER): Payer: Medicare Other | Admitting: Internal Medicine

## 2013-05-11 ENCOUNTER — Encounter: Payer: Self-pay | Admitting: Internal Medicine

## 2013-05-11 VITALS — BP 132/78 | HR 56 | Ht 72.0 in | Wt 194.0 lb

## 2013-05-11 DIAGNOSIS — I4892 Unspecified atrial flutter: Secondary | ICD-10-CM

## 2013-05-11 DIAGNOSIS — I4719 Other supraventricular tachycardia: Secondary | ICD-10-CM

## 2013-05-11 DIAGNOSIS — I2510 Atherosclerotic heart disease of native coronary artery without angina pectoris: Secondary | ICD-10-CM

## 2013-05-11 DIAGNOSIS — Z950 Presence of cardiac pacemaker: Secondary | ICD-10-CM

## 2013-05-11 DIAGNOSIS — I498 Other specified cardiac arrhythmias: Secondary | ICD-10-CM

## 2013-05-11 DIAGNOSIS — I471 Supraventricular tachycardia: Secondary | ICD-10-CM

## 2013-05-11 NOTE — Assessment & Plan Note (Signed)
Continues with atypical chest pain brought on primarily by stress. Continue one aspirin and Rivaroxaban

## 2013-05-11 NOTE — Assessment & Plan Note (Signed)
The patient has had persistent atrial flutter/tachycardia with a cycle length of 330 since now mid-March without significant or specifically attributable symptoms. He does say that he feels 75% versus 90% but is not sure at the time of regular rhythm in February and March were associated with fewer symptoms.  He has had significant improvement in his symptoms as he has come off of dofetilide; this was associated with profound balance issues that persisted for months following the discontinuation  His asked me to be in touch with Dr. Delena Serve to discuss the possibility of another catheter ablation procedure

## 2013-05-11 NOTE — Patient Instructions (Addendum)
Your physician wants you to follow-up in: 6 months with Dr. Klein. You will receive a reminder letter in the mail two months in advance. If you don't receive a letter, please call our office to schedule the follow-up appointment.  Your physician recommends that you continue on your current medications as directed. Please refer to the Current Medication list given to you today.  

## 2013-05-11 NOTE — Progress Notes (Signed)
Patient Care Team: Darnelle Bos, MD as PCP - General (Internal Medicine)   HPI  [name] is a 67 y.o. male Seen in followup for atrial arrhythmias for which he takes no antiarrhythmic currently. He is s/p PVI at [facility]   He has has problems with symptomatic palpitations related to long AV delays and felt much better with a shortening of his AV delay. Unfortunately he still complains of significant exercise intolerance which we addressed in part by increasing his upper sensor rate.  He has a history of coronary artery disease with prior stenting He has some vague episodes of atypical chest pain mostly related anatomically to his pacemaker site to which time he also dates them. They have also been largely unassociated with exertion  At the last visit with Dr. Zara Chess prior to his retirement it was elected to use aspirin and a NOAC and Rivaroxaban was started  Past Medical History  Diagnosis Date  . Hyperlipidemia   . Pulsatile tinnitus   . 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  . Familial tremor   . Hypertension   . Benign paroxysmal vertigo   . Heart murmur   . Atrial flutter -atypical 2007    ABLATION x2 [facility]  . Atrial tachycardia 2007    ABLATION [facility]  . First degree AV block   . Tachy-brady syndrome     a. developed after RFCA @ [facility] 2013-> MDT PPM;  b. s/p RA lead revision 05/2012  . History of blood transfusion 2010    "2nd day after prostatectomy"  . Migraines   . Prostate cancer   . Carcinoma     "off my chest"  . CAD (coronary artery disease)     a. abnl mv 01/2012;  b. cath 05/23/2012 LCX 80@ bifurcation w/ OM, 95% @ AV groove, otw nonobs dzs, EF 55-65%;    Past Surgical History  Procedure Laterality Date  . Prostatectomy  02/20/2009    COMPLICATED  . Shoulder surgery      Bilateral; "boney spurs removed"  . Atrial ablation surgery  04/01/12    "4th time I had one"  . Colonoscopy    . Cardioversion  12/03/2011    Procedure:  CARDIOVERSION;  Surgeon: Duke Salvia, MD;  Location: [facility] OR;  Service: Cardiovascular;  Laterality: [name];  . Cardioversion  01/13/2012    Procedure: CARDIOVERSION;  Surgeon: Lewayne Bunting, MD;  Location: [facility] OR;  Service: Cardiovascular;  Laterality: [name];  . Cardioversion  04/27/2012    Procedure: CARDIOVERSION;  Surgeon: Cassell Clement, MD;  Location: [facility] OR;  Service: Cardiovascular;  Laterality: [name];  . Insert / replace / remove pacemaker  03/2012    initial placement  . Insert / replace / remove pacemaker  05/25/12    "atrial lead change"  . Inguinal hernia repair  1945    left side  . Hernia repair  1980's  . Tonsillectomy and adenoidectomy      "as a child"  . Skin cancer excision      "in situ; right chest"  . Cardiac catheterization    . Coronary angioplasty with stent placement  06/02/12    "1; first one ever"    Current Outpatient Prescriptions  Medication Sig Dispense Refill  . acyclovir (ZOVIRAX) 200 MG capsule Take 200 mg by mouth as needed. For outbreak      . amLODipine (NORVASC) 5 MG tablet       . aspirin EC 81 MG tablet  Take 81 mg by mouth daily.      . cholecalciferol (VITAMIN D) 1000 UNITS tablet Take 1,000 Units by mouth daily.      . nitroGLYCERIN (NITROSTAT) 0.4 MG SL tablet Place 1 tablet (0.4 mg total) under the tongue every 5 (five) minutes as needed for chest pain.  25 tablet  3  . propranolol (INDERAL) 10 MG tablet Take one tablet by mouth three times daily as needed  90 tablet  6  . Rivaroxaban (XARELTO) 20 MG TABS Take 1 tablet (20 mg total) by mouth daily with supper.  30 tablet  11  . vitamin B-12 (CYANOCOBALAMIN) 1000 MCG tablet Take 1,000 mcg by mouth daily.       No current facility-administered medications for this visit.    Allergies  Allergen Reactions  . Ceclor (Cefaclor) Hives  . Chlorhexidine Itching and Rash  . Statins Other (See Comments)    Muscle weakness  . Ace Inhibitors Other (See Comments)    Does not take due to "angiotensin  is responsible for maintaining bladder tone"    Review of Systems negative except from HPI and PMH  Physical Exam BP 132/78  Pulse 56  Ht 6' (1.829 m)  Wt 194 lb (87.998 kg)  BMI 26.31 kg/m2 Well developed and well nourished in no acute distress HENT normal E scleral and icterus clear Neck Supple JVP flat; carotids brisk and full Clear to ausculation  some irregular rate and rhythm, no murmurs gallops or rub Soft with active bowel sounds No clubbing cyanosis none Edema Alert and oriented, grossly normal motor and sensory function Skin Warm and Dry    Assessment and  Plan

## 2013-05-11 NOTE — Assessment & Plan Note (Signed)
The patient's device was interrogated.  The information was reviewed. No changes were made in the programming.    

## 2013-05-12 LAB — PACEMAKER DEVICE OBSERVATION
AL AMPLITUDE: 1.5 mv
BAMS-0001: 155 {beats}/min
BRDY-0002RV: 60 {beats}/min
RV LEAD AMPLITUDE: 8.6 mv
RV LEAD IMPEDENCE PM: 513 Ohm

## 2013-06-21 ENCOUNTER — Other Ambulatory Visit: Payer: Self-pay

## 2013-08-21 ENCOUNTER — Other Ambulatory Visit: Payer: Self-pay | Admitting: Internal Medicine

## 2013-09-21 ENCOUNTER — Other Ambulatory Visit: Payer: Self-pay

## 2013-09-26 ENCOUNTER — Encounter: Payer: Self-pay | Admitting: Cardiovascular Disease

## 2013-09-26 ENCOUNTER — Ambulatory Visit (INDEPENDENT_AMBULATORY_CARE_PROVIDER_SITE_OTHER): Payer: Medicare Other | Admitting: Cardiovascular Disease

## 2013-09-26 VITALS — BP 130/82 | HR 65 | Ht 72.0 in | Wt 191.0 lb

## 2013-09-26 DIAGNOSIS — I2510 Atherosclerotic heart disease of native coronary artery without angina pectoris: Secondary | ICD-10-CM

## 2013-09-26 DIAGNOSIS — I4892 Unspecified atrial flutter: Secondary | ICD-10-CM

## 2013-09-26 DIAGNOSIS — I10 Essential (primary) hypertension: Secondary | ICD-10-CM

## 2013-09-26 MED ORDER — NITROGLYCERIN 0.4 MG SL SUBL: 0.4000 mg | SUBLINGUAL_TABLET | SUBLINGUAL | Status: DC | PRN

## 2013-09-26 NOTE — Progress Notes (Signed)
HPI:  67 year old gentleman presenting for followup evaluation. He has been followed by Dr. Riley Kill for coronary artery disease. He's also seen by Dr. Graciela Husbands for atrial dysrhythmias and has undergone multiple catheter ablation procedures at [facility] [place] [facility]. He has a permanent pacemaker in place.  The patient has frequent chest discomfort. He has difficulty distinguishing anginal pain from pain related to his pacemaker. He has only rare symptoms associated with physical exertion. He does have occasional angina with emotional stress. He denies shortness of breath. He complains of chronic lightheadedness. No episodes of syncope. No leg swelling, orthopnea, or PND.  Outpatient Encounter Prescriptions as of 09/26/2013  Medication Sig  . acyclovir (ZOVIRAX) 200 MG capsule Take 200 mg by mouth as needed. For outbreak  . amLODipine (NORVASC) 5 MG tablet   . aspirin EC 81 MG tablet Take 81 mg by mouth daily.  . cholecalciferol (VITAMIN D) 1000 UNITS tablet Take 1,000 Units by mouth daily.  . nitroGLYCERIN (NITROSTAT) 0.4 MG SL tablet Place 1 tablet (0.4 mg total) under the tongue every 5 (five) minutes as needed for chest pain.  Marland Kitchen propranolol (INDERAL) 10 MG tablet TAKE ONE TABLET BY MOUTH THREE TIMES DAILY AS NEEDED  . Rivaroxaban (XARELTO) 20 MG TABS Take 1 tablet (20 mg total) by mouth daily with supper.  . vitamin B-12 (CYANOCOBALAMIN) 1000 MCG tablet Take 1,000 mcg by mouth daily.    Allergies  Allergen Reactions  . Ceclor [Cefaclor] Hives  . Chlorhexidine Itching and Rash  . Statins Other (See Comments)    Muscle weakness  . Ace Inhibitors Other (See Comments)    Does not take due to "angiotensin is responsible for maintaining bladder tone"    Past Medical History  Diagnosis Date  . Hyperlipidemia   . Pulsatile tinnitus   . 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  . Familial tremor   . Hypertension   . Benign paroxysmal vertigo   .  Heart murmur   . Atrial flutter -atypical 2007    ABLATION x2 [facility]  . Atrial tachycardia 2007    ABLATION [facility]  . First degree AV block   . Tachy-brady syndrome     a. developed after RFCA @ [facility] 2013-> MDT [facility]PM;  b. s/p RA lead revision 05/2012  . History of blood transfusion 2010    "2nd day after prostatectomy"  . Migraines   . Prostate cancer   . Carcinoma     "off my chest"  . CAD (coronary artery disease)     a. abnl mv 01/2012;  b. cath 05/23/2012 LCX 80@ bifurcation w/ OM, 95% @ AV groove, otw nonobs dzs, EF 55-65%;    ROS: Negative except as per HPI  BP 130/82  Pulse 65  Ht 6' (1.829 m)  Wt 191 lb (86.637 kg)  BMI 25.90 kg/m2  PHYSICAL EXAM: Pt is alert and oriented, NAD HEENT: normal Neck: JVP - normal, carotids 2+= without bruits Lungs: CTA bilaterally CV: RRR without murmur or gallop Abd: soft, NT, Positive BS, no hepatomegaly Ext: no C/C/E, distal pulses intact and equal Skin: warm/dry no rash  EKG:  AV sequential pacing 65 beats per minute.  2-D echocardiogram 12/01/2012: Study Conclusions  - Left ventricle: Septal hypokinesis consistent with conduction delay. The cavity size was normal. Wall thickness was normal. Systolic function was normal. The estimated ejection fraction was in the range of 55% to 60%. - Mitral valve: Mild regurgitation. - Pulmonary arteries: PA peak  pressure: 32mm Hg (S).  ASSESSMENT AND PLAN: 1. Coronary atherosclerosis, native vessel. Coronary anatomy reviewed from previous cardiac catheterization reports. He has nonobstructive disease of the LAD and right coronary arteries. Left circumflex has been stented with residual sidebranch occlusion. His chronic angina appears stable. I did not recommend any medication changes today. He is anticoagulated with and maintains antiplatelet therapy on low-dose aspirin. He will return for 6 months followup.  2. Hypertension. Blood pressure is controlled on amlodipine.  3 Hyperlipidemia. The  patient is statin intolerant. He's followed by Dr. Earl Gala.  4. Atrial flutter. Management per Dr. Graciela Husbands.  Tonny Bollman 09/26/2013 10:12 AM

## 2013-09-26 NOTE — Patient Instructions (Signed)
Your physician recommends that you continue on your current medications as directed. Please refer to the Current Medication list given to you today.  Your physician wants you to follow-up in: 6 MONTHS with Dr Cooper.  You will receive a reminder letter in the mail two months in advance. If you don't receive a letter, please call our office to schedule the follow-up appointment.  

## 2013-11-02 ENCOUNTER — Encounter: Payer: Self-pay | Admitting: Internal Medicine

## 2013-11-02 ENCOUNTER — Ambulatory Visit (INDEPENDENT_AMBULATORY_CARE_PROVIDER_SITE_OTHER): Payer: Medicare Other | Admitting: Internal Medicine

## 2013-11-02 VITALS — BP 125/85 | HR 84 | Ht 72.0 in | Wt 193.0 lb

## 2013-11-02 DIAGNOSIS — R0609 Other forms of dyspnea: Secondary | ICD-10-CM

## 2013-11-02 DIAGNOSIS — Z950 Presence of cardiac pacemaker: Secondary | ICD-10-CM

## 2013-11-02 DIAGNOSIS — I4892 Unspecified atrial flutter: Secondary | ICD-10-CM

## 2013-11-02 DIAGNOSIS — I4891 Unspecified atrial fibrillation: Secondary | ICD-10-CM

## 2013-11-02 LAB — MDC_IDC_ENUM_SESS_TYPE_INCLINIC
Battery Voltage: 3 V
Brady Statistic AP VS Percent: 0.2 %
Brady Statistic AS VP Percent: 66.89 %
Brady Statistic RA Percent Paced: 30.58 %
Date Time Interrogation Session: 20141218162413
Lead Channel Impedance Value: 380 Ohm
Lead Channel Impedance Value: 475 Ohm
Lead Channel Pacing Threshold Amplitude: 0.875 V
Lead Channel Pacing Threshold Pulse Width: 0.4 ms
Lead Channel Pacing Threshold Pulse Width: 0.4 ms
Lead Channel Sensing Intrinsic Amplitude: 0.875 mV
Lead Channel Sensing Intrinsic Amplitude: 1.5 mV
Lead Channel Setting Pacing Amplitude: 2 V
Lead Channel Setting Pacing Amplitude: 2.5 V
Lead Channel Setting Pacing Pulse Width: 0.4 ms
Lead Channel Setting Sensing Sensitivity: 0.9 mV
Zone Setting Detection Interval: 390 ms
Zone Setting Detection Interval: 390 ms

## 2013-11-02 NOTE — Progress Notes (Signed)
Patient Care Team: Darnelle Bos, MD as PCP - General (Internal Medicine)   HPI  [name] is a 67 y.o. male Seen in followup for atrial arrhythmias for which he takes no antiarrhythmic currently. He is s/p PVI at [facility]    he remains at modestly impaired quality of life but notes no significant changes week to week but does have better days and worse days.   He has a history of coronary artery disease with prior stenting    At the last visit with Dr. Zara Chess prior to his retirement it was elected to use aspirin and a NOAC and Rivaroxaban was started   Past Medical History  Diagnosis Date  . Hyperlipidemia   . Pulsatile tinnitus   . 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  . Familial tremor   . Hypertension   . Benign paroxysmal vertigo   . Heart murmur   . Atrial flutter -atypical 2007    ABLATION x2 [facility]  . Atrial tachycardia 2007    ABLATION [facility]  . First degree AV block   . Tachy-brady syndrome     a. developed after RFCA @ [facility] 2013-> MDT [facility]PM;  b. s/p RA lead revision 05/2012  . History of blood transfusion 2010    "2nd day after prostatectomy"  . Migraines   . Prostate cancer   . Carcinoma     "off my chest"  . CAD (coronary artery disease)     a. abnl mv 01/2012;  b. cath 05/23/2012 LCX 80@ bifurcation w/ OM, 95% @ AV groove, otw nonobs dzs, EF 55-65%;    Past Surgical History  Procedure Laterality Date  . Prostatectomy  02/20/2009    COMPLICATED  . Shoulder surgery      Bilateral; "boney spurs removed"  . Atrial ablation surgery  04/01/12    "4th time I had one"  . Colonoscopy    . Cardioversion  12/03/2011    Procedure: CARDIOVERSION;  Surgeon: Duke Salvia, MD;  Location: [facility] OR;  Service: Cardiovascular;  Laterality: [name];  . Cardioversion  01/13/2012    Procedure: CARDIOVERSION;  Surgeon: Lewayne Bunting, MD;  Location: [facility] OR;  Service: Cardiovascular;  Laterality: [name];  . Cardioversion  04/27/2012    Procedure:  CARDIOVERSION;  Surgeon: Cassell Clement, MD;  Location: [facility] OR;  Service: Cardiovascular;  Laterality: [name];  . Insert / replace / remove pacemaker  03/2012    initial placement  . Insert / replace / remove pacemaker  05/25/12    "atrial lead change"  . Inguinal hernia repair  1945    left side  . Hernia repair  1980's  . Tonsillectomy and adenoidectomy      "as a child"  . Skin cancer excision      "in situ; right chest"  . Cardiac catheterization    . Coronary angioplasty with stent placement  06/02/12    "1; first one ever"    Current Outpatient Prescriptions  Medication Sig Dispense Refill  . acyclovir (ZOVIRAX) 200 MG capsule Take 200 mg by mouth as needed. For outbreak      . amLODipine (NORVASC) 5 MG tablet       . aspirin EC 81 MG tablet Take 81 mg by mouth daily.      . cholecalciferol (VITAMIN D) 1000 UNITS tablet Take 1,000 Units by mouth daily.      . nitroGLYCERIN (NITROSTAT) 0.4 MG SL tablet Place 1 tablet (0.4  mg total) under the tongue every 5 (five) minutes as needed for chest pain.  25 tablet  3  . propranolol (INDERAL) 10 MG tablet TAKE ONE TABLET BY MOUTH THREE TIMES DAILY AS NEEDED  90 tablet  1  . Rivaroxaban (XARELTO) 20 MG TABS Take 1 tablet (20 mg total) by mouth daily with supper.  30 tablet  11  . vitamin B-12 (CYANOCOBALAMIN) 1000 MCG tablet Take 1,000 mcg by mouth daily.       No current facility-administered medications for this visit.    Allergies  Allergen Reactions  . Ceclor [Cefaclor] Hives  . Chlorhexidine Itching and Rash  . Statins Other (See Comments)    Muscle weakness  . Ace Inhibitors Other (See Comments)    Does not take due to "angiotensin is responsible for maintaining bladder tone"    Review of Systems negative except from HPI and PMH  Physical Exam BP 125/85  Pulse 84  Ht 6' (1.829 m)  Wt 193 lb (87.544 kg)  BMI 26.17 kg/m2 Well developed and nourished in no acute distress HENT normal Neck supple with  JVP-flat Clear Device pocket well healed; without hematoma or erythema.  There is no tethering  Regular rate and rhythm, no murmurs or gallops Abd-soft with active BS No Clubbing cyanosis edema Skin-warm and dry A & Oriented  Grossly normal sensory and motor function     Assessment and  Plan  d

## 2013-11-02 NOTE — Patient Instructions (Addendum)
Your physician recommends that you continue on your current medications as directed. Please refer to the Current Medication list given to you today.  Your physician recommends that you schedule a follow-up appointment in: 3 months with Dr. Klein.  

## 2013-11-02 NOTE — Assessment & Plan Note (Signed)
The patient has persistent atrial arrhythmias. Been a couple of weeks where there have been no arrhythmia in no; the fact that he does not identify weeks that are better than others his me think that there is little benefit to repeat ablation. We discussed this at length. We will continue him on his anticoagulant. He'll try and identify. The time he feels betteror nto

## 2013-11-02 NOTE — Assessment & Plan Note (Signed)
The patient knows heart rates excessive when he exercises although his recent data suggests that there is a paucity of these over 100 beats per minute. Mild exercise testing in the office demonstrated a peak heart rate of 95. The event that he continues to be concerned exercise tolerance I recommended to undertake treadmill testing with pacing programming

## 2013-11-02 NOTE — Assessment & Plan Note (Signed)
.  dfbn The patient's device was interrogated.  The information was reviewed. No changes were made in the programming.

## 2014-02-02 ENCOUNTER — Other Ambulatory Visit: Payer: Self-pay

## 2014-02-02 ENCOUNTER — Other Ambulatory Visit: Payer: Self-pay | Admitting: Cardiology

## 2014-02-02 DIAGNOSIS — I4892 Unspecified atrial flutter: Secondary | ICD-10-CM

## 2014-02-02 DIAGNOSIS — I2510 Atherosclerotic heart disease of native coronary artery without angina pectoris: Secondary | ICD-10-CM

## 2014-02-02 MED ORDER — RIVAROXABAN 20 MG PO TABS: 20.0000 mg | ORAL_TABLET | Freq: Every day | ORAL | Status: DC

## 2014-02-06 ENCOUNTER — Encounter: Payer: Self-pay | Admitting: Internal Medicine

## 2014-02-06 ENCOUNTER — Ambulatory Visit (INDEPENDENT_AMBULATORY_CARE_PROVIDER_SITE_OTHER): Payer: Medicare Other | Admitting: Internal Medicine

## 2014-02-06 VITALS — BP 139/77 | HR 47 | Ht 72.0 in | Wt 196.0 lb

## 2014-02-06 DIAGNOSIS — I440 Atrioventricular block, first degree: Secondary | ICD-10-CM

## 2014-02-06 DIAGNOSIS — I4892 Unspecified atrial flutter: Secondary | ICD-10-CM

## 2014-02-06 DIAGNOSIS — I10 Essential (primary) hypertension: Secondary | ICD-10-CM

## 2014-02-06 DIAGNOSIS — I2510 Atherosclerotic heart disease of native coronary artery without angina pectoris: Secondary | ICD-10-CM

## 2014-02-06 DIAGNOSIS — Z950 Presence of cardiac pacemaker: Secondary | ICD-10-CM

## 2014-02-06 DIAGNOSIS — I4891 Unspecified atrial fibrillation: Secondary | ICD-10-CM

## 2014-02-06 LAB — MDC_IDC_ENUM_SESS_TYPE_INCLINIC
Battery Voltage: 3 V
Brady Statistic AP VP Percent: 31.57 %
Brady Statistic AP VS Percent: 0.19 %
Brady Statistic AS VS Percent: 2.91 %
Brady Statistic RV Percent Paced: 96.9 %
Date Time Interrogation Session: 20150324151524
Lead Channel Impedance Value: 399 Ohm
Lead Channel Impedance Value: 399 Ohm
Lead Channel Pacing Threshold Amplitude: 1 V
Lead Channel Sensing Intrinsic Amplitude: 0.5 mV
Lead Channel Sensing Intrinsic Amplitude: 11.875 mV
Lead Channel Setting Pacing Amplitude: 2.5 V
Lead Channel Setting Pacing Pulse Width: 0.4 ms
MDC IDC MSMT BATTERY REMAINING LONGEVITY: 72 mo
MDC IDC MSMT LEADCHNL RA IMPEDANCE VALUE: 494 Ohm
MDC IDC MSMT LEADCHNL RV IMPEDANCE VALUE: 494 Ohm
MDC IDC MSMT LEADCHNL RV PACING THRESHOLD PULSEWIDTH: 0.4 ms
MDC IDC SET LEADCHNL RA PACING AMPLITUDE: 2 V
MDC IDC SET LEADCHNL RV SENSING SENSITIVITY: 0.9 mV
MDC IDC SET ZONE DETECTION INTERVAL: 390 ms
MDC IDC STAT BRADY AS VP PERCENT: 65.33 %
MDC IDC STAT BRADY RA PERCENT PACED: 31.77 %
Zone Setting Detection Interval: 390 ms

## 2014-02-06 MED ORDER — PROPRANOLOL HCL 10 MG PO TABS: 10.0000 mg | ORAL_TABLET | Freq: Three times a day (TID) | ORAL | Status: DC | PRN

## 2014-02-06 MED ORDER — NITROGLYCERIN 0.4 MG SL SUBL: 0.4000 mg | SUBLINGUAL_TABLET | SUBLINGUAL | Status: DC | PRN

## 2014-02-06 NOTE — Progress Notes (Signed)
Walter      Patient Care Team: Horton Finer, MD as PCP - General (Internal Medicine)   HPI  [name] is a 68 y.o. male Seen in followup for atrial arrhythmias for which he takes no antiarrhythmic currently. He is s/p PVI at [facility]  he remains at modestly impaired quality of life but notes no significant changes week to week but does have better days and worse days. We attempted to correlate symptoms with exercise tolerance. He brings in a calendar elbows do this. It is interesting that he has spent many weeks out of rhythm without associated palpitations. There were symptoms associated with reversion to sinus and atrial flutter that occurred in mid February.  He has a history of coronary artery disease with prior stenting  At the last visit with Dr. Philmore Pali prior to his retirement it was elected to use aspirin and a NOAC and Rivaroxaban was started   Past Medical History  Diagnosis Date  . Hyperlipidemia   . Pulsatile tinnitus   . 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  . Familial tremor   . Hypertension   . Benign paroxysmal vertigo   . Heart murmur   . Atrial flutter -atypical 2007    ABLATION x2 [facility]  . Atrial tachycardia 2007    ABLATION [facility]  . First degree AV block   . Tachy-brady syndrome     a. developed after RFCA @ [facility] 2013-> MDT PPM;  b. s/p RA lead revision 05/2012  . History of blood transfusion 2010    "2nd day after prostatectomy"  . Migraines   . Prostate cancer   . Carcinoma     "off my chest"  . CAD (coronary artery disease)     a. abnl mv 01/2012;  b. cath 05/23/2012 LCX 80@ bifurcation w/ OM, 95% @ AV groove, otw nonobs dzs, EF 55-65%;    Past Surgical History  Procedure Laterality Date  . Prostatectomy  11/21/1094    COMPLICATED  . Shoulder surgery      Bilateral; "boney spurs removed"  . Atrial ablation surgery  04/01/12    "4th time I had one"  . Colonoscopy    . Cardioversion  12/03/2011    Procedure:  CARDIOVERSION;  Surgeon: [name], MD;  Location: [place];  Service: Cardiovascular;  Laterality: [name];  . Cardioversion  01/13/2012    Procedure: CARDIOVERSION;  Surgeon: Lelon Perla, MD;  Location: [place];  Service: Cardiovascular;  Laterality: [name];  . Cardioversion  04/27/2012    Procedure: CARDIOVERSION;  Surgeon: Darlin Coco, MD;  Location: [place];  Service: Cardiovascular;  Laterality: [name];  . Insert / replace / remove pacemaker  03/2012    initial placement  . Insert / replace / remove pacemaker  05/25/12    "atrial lead change"  . Inguinal hernia repair  1945    left side  . Hernia repair  1980's  . Tonsillectomy and adenoidectomy      "as a child"  . Skin cancer excision      "in situ; right chest"  . Cardiac catheterization    . Coronary angioplasty with stent placement  06/02/12    "1; first one ever"    Current Outpatient Prescriptions  Medication Sig Dispense Refill  . acyclovir (ZOVIRAX) 200 MG capsule Take 200 mg by mouth as needed. For outbreak      . amLODipine (NORVASC) 5 MG tablet       . aspirin  EC 81 MG tablet Take 81 mg by mouth daily.      . cholecalciferol (VITAMIN D) 1000 UNITS tablet Take 1,000 Units by mouth daily.      . nitroGLYCERIN (NITROSTAT) 0.4 MG SL tablet Place 1 tablet (0.4 mg total) under the tongue every 5 (five) minutes as needed for chest pain.  25 tablet  3  . propranolol (INDERAL) 10 MG tablet TAKE ONE TABLET BY MOUTH THREE TIMES DAILY AS NEEDED  90 tablet  1  . Rivaroxaban (XARELTO) 20 MG TABS tablet Take 1 tablet (20 mg total) by mouth daily with supper.  30 tablet  11  . vitamin B-12 (CYANOCOBALAMIN) 1000 MCG tablet Take 1,000 mcg by mouth daily.       No current facility-administered medications for this visit.    Allergies  Allergen Reactions  . Ceclor [Cefaclor] Hives  . Chlorhexidine Itching and Rash  . Statins Other (See Comments)    Muscle weakness  . Ace Inhibitors Other (See Comments)    Does not take due to  "angiotensin is responsible for maintaining bladder tone"    Review of Systems negative except from HPI and PMH  Physical Exam BP 139/77  Pulse 47  Ht 6' (1.829 m)  Wt 196 lb (88.905 kg)  BMI 26.58 kg/m2 Well developed and well nourished in no acute distress HENT normal E scleral and icterus clear Neck Supple JVP flat; carotids brisk and full Clear to ausculation  Regular rate and rhythm, 2/6 early systolic murmur Soft with active bowel sounds No clubbing cyanosis  Edema Alert and oriented, grossly normal motor and sensory function Skin Warm and Dry    Assessment and  Plan  Atrial flutter   Atrial fibrillation s/p ablation [facility] x2  Sinus node dysfunction  Pacemaker [employer]  The patient's device was interrogated.  The information was reviewed. No changes were made in the programming.    Coronary artery disease s/p  DES stenting 2013  The patient has largely asymptomatic atrial flutter. At this juncture we will not pursue further ablation. He will use of propranolol in anticipation of exercise as this is the one place he is noted some elevated heart rates.  The decision to continue him on double therapy was made by him and Dr. Philmore Pali. We will not interrupt this plan.

## 2014-02-06 NOTE — Patient Instructions (Signed)
Your physician recommends that you continue on your current medications as directed. Please refer to the Current Medication list given to you today.  Your physician wants you to follow-up in: 6 months with Dr. Klein. You will receive a reminder letter in the mail two months in advance. If you don't receive a letter, please call our office to schedule the follow-up appointment.  

## 2014-02-12 ENCOUNTER — Encounter: Payer: Self-pay | Admitting: Internal Medicine

## 2014-03-05 ENCOUNTER — Other Ambulatory Visit: Payer: Self-pay | Admitting: Dermatology

## 2014-03-22 ENCOUNTER — Encounter: Payer: Self-pay | Admitting: Cardiovascular Disease

## 2014-03-22 NOTE — Telephone Encounter (Signed)
This encounter was created in error - please disregard.

## 2014-03-22 NOTE — Telephone Encounter (Signed)
New Prob   Pt states he received a letter to return and see Dr. Burt Knack in May. No open slots until September at this time. Pt states he would like to speak to a nurse regarding holding his follow up appointment until September.

## 2014-03-28 ENCOUNTER — Encounter: Payer: Self-pay | Admitting: Cardiovascular Disease

## 2014-05-02 ENCOUNTER — Ambulatory Visit (INDEPENDENT_AMBULATORY_CARE_PROVIDER_SITE_OTHER): Payer: Medicare Other | Admitting: Cardiovascular Disease

## 2014-05-02 ENCOUNTER — Encounter: Payer: Self-pay | Admitting: Cardiovascular Disease

## 2014-05-02 VITALS — BP 127/81 | HR 65 | Ht 72.0 in | Wt 191.1 lb

## 2014-05-02 DIAGNOSIS — I2510 Atherosclerotic heart disease of native coronary artery without angina pectoris: Secondary | ICD-10-CM

## 2014-05-02 DIAGNOSIS — I4891 Unspecified atrial fibrillation: Secondary | ICD-10-CM

## 2014-05-02 MED ORDER — PROPRANOLOL HCL 10 MG PO TABS: 10.0000 mg | ORAL_TABLET | Freq: Three times a day (TID) | ORAL | Status: DC | PRN

## 2014-05-02 NOTE — Patient Instructions (Signed)
Your physician wants you to follow-up in: 1 YEAR with Dr Cooper.  You will receive a reminder letter in the mail two months in advance. If you don't receive a letter, please call our office to schedule the follow-up appointment.  Your physician recommends that you continue on your current medications as directed. Please refer to the Current Medication list given to you today.  

## 2014-05-02 NOTE — Progress Notes (Signed)
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 He is a 68 year old gentleman with coronary artery disease. He also has atrial flutter with history of tachybradycardia syndrome and has undergone permanent pacemaker placement. He's undergone catheter ablation procedures as well.   He underwent cardiac catheterization last in 2013 demonstrating high-grade bifurcational disease involving the left circumflex/OM 1. He was noted to have moderate RCA stenosis and minor nonobstructive LAD stenosis at that time. He underwent PCI of the left circumflex with drug-eluting stent placement into a large obtuse marginal. There was side branch occlusion with collateral filling at the completion of the procedure.  From a symptomatic perspective, the patient has done well. He notes some elevated heart rates with exercise. He has not had recent anginal symptoms. He denies shortness of breath, orthopnea, or PND. He's had symptoms of postural hypotension in the mornings.  Outpatient Encounter Prescriptions as of 05/02/2014  Medication Sig  . acyclovir (ZOVIRAX) 200 MG capsule Take 200 mg by mouth 2 (two) times daily as needed. For outbreak  . amLODipine (NORVASC) 5 MG tablet   . aspirin EC 81 MG tablet Take 81 mg by mouth daily.  . cholecalciferol (VITAMIN D) 1000 UNITS tablet Take 1,000 Units by mouth daily.  . nitroGLYCERIN (NITROSTAT) 0.4 MG SL tablet Place 1 tablet (0.4 mg total) under the tongue every 5 (five) minutes as needed for chest pain.  Marland Kitchen propranolol (INDERAL) 10 MG tablet Take 1 tablet (10 mg total) by mouth 3 (three) times daily as needed.  . Rivaroxaban (XARELTO) 20 MG TABS tablet Take 1 tablet (20 mg total) by mouth daily with supper.  . vitamin B-12 (CYANOCOBALAMIN) 1000 MCG tablet Take 1,000 mcg by mouth daily.    Allergies  Allergen Reactions  . Ceclor [Cefaclor] Hives  . Chlorhexidine Itching and Rash  . Statins Other (See Comments)    Muscle weakness  . Ace Inhibitors Other (See  Comments)    Does not take due to "angiotensin is responsible for maintaining bladder tone"    Past Medical History  Diagnosis Date  . Hyperlipidemia   . Pulsatile tinnitus   . 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  . Familial tremor   . Hypertension   . Benign paroxysmal vertigo   . Heart murmur   . Atrial flutter -atypical 2007    ABLATION x2 [facility]  . Atrial tachycardia 2007    ABLATION [facility]  . First degree AV block   . Tachy-brady syndrome     a. developed after RFCA @ [facility] 2013-> MDT PPM;  b. s/p RA lead revision 05/2012  . History of blood transfusion 2010    "2nd day after prostatectomy"  . Migraines   . Prostate cancer   . Carcinoma     "off my chest"  . CAD (coronary artery disease)     a. abnl mv 01/2012;  b. cath 05/23/2012 LCX 80@ bifurcation w/ OM, 95% @ AV groove, otw nonobs dzs, EF 55-65%;    ROS: Negative except as per HPI  BP 127/81  Pulse 65  Ht 6' (1.829 m)  Wt 86.691 kg (191 lb 1.9 oz)  BMI 25.91 kg/m2  PHYSICAL EXAM: Pt is alert and oriented, NAD HEENT: normal Neck: JVP - normal, carotids 2+= without bruits Lungs: CTA bilaterally CV: RRR without murmur or gallop Abd: soft, NT, Positive BS, no hepatomegaly Ext: no C/C/E, distal pulses intact and equal Skin: warm/dry no rash  ASSESSMENT AND PLAN: 1. Coronary artery disease,  native vessel. Currently stable without symptoms of angina. Continue low-dose aspirin. The patient is statin intolerant. He will followup in one year unless problems arise.  2. Atrial fibrillation/flutter. Anticoagulated with Xarelto. Managed by Dr. Caryl Comes.  [name] 05/02/2014 2:29 PM

## 2014-08-24 ENCOUNTER — Encounter: Payer: Self-pay | Admitting: *Deleted

## 2014-08-31 ENCOUNTER — Other Ambulatory Visit: Payer: Self-pay

## 2014-10-25 ENCOUNTER — Encounter (HOSPITAL_COMMUNITY): Payer: Self-pay | Admitting: Internal Medicine

## 2014-11-02 ENCOUNTER — Encounter: Payer: Self-pay | Admitting: Cardiovascular Disease

## 2014-11-02 NOTE — Telephone Encounter (Signed)
New Message       Patient would like for you to call back on Monday if at all possible, patient would not say what it was about on to speak with Dr Antionette Char Nurse.Marland KitchenMarland Kitchen

## 2014-11-05 NOTE — Telephone Encounter (Signed)
This encounter was created in error - please disregard.

## 2014-11-14 ENCOUNTER — Encounter: Payer: Self-pay | Admitting: Gastroenterology

## 2014-11-20 ENCOUNTER — Ambulatory Visit (INDEPENDENT_AMBULATORY_CARE_PROVIDER_SITE_OTHER): Payer: Medicare Other | Admitting: Internal Medicine

## 2014-11-20 ENCOUNTER — Encounter: Payer: Self-pay | Admitting: Internal Medicine

## 2014-11-20 VITALS — BP 132/86 | HR 68 | Ht 72.0 in | Wt 195.2 lb

## 2014-11-20 DIAGNOSIS — I4719 Other supraventricular tachycardia: Secondary | ICD-10-CM

## 2014-11-20 DIAGNOSIS — I4892 Unspecified atrial flutter: Secondary | ICD-10-CM

## 2014-11-20 DIAGNOSIS — I495 Sick sinus syndrome: Secondary | ICD-10-CM

## 2014-11-20 DIAGNOSIS — I471 Supraventricular tachycardia: Secondary | ICD-10-CM

## 2014-11-20 DIAGNOSIS — I440 Atrioventricular block, first degree: Secondary | ICD-10-CM

## 2014-11-20 LAB — MDC_IDC_ENUM_SESS_TYPE_INCLINIC
Battery Remaining Longevity: 59 mo
Battery Voltage: 2.99 V
Brady Statistic AP VS Percent: 1.11 %
Brady Statistic AS VP Percent: 74.54 %
Brady Statistic AS VS Percent: 2.81 %
Brady Statistic RA Percent Paced: 22.65 %
Date Time Interrogation Session: 20160105170252
Lead Channel Impedance Value: 399 Ohm
Lead Channel Impedance Value: 494 Ohm
Lead Channel Pacing Threshold Amplitude: 0.75 V
Lead Channel Pacing Threshold Amplitude: 0.875 V
Lead Channel Pacing Threshold Pulse Width: 0.4 ms
Lead Channel Pacing Threshold Pulse Width: 0.4 ms
Lead Channel Sensing Intrinsic Amplitude: 0.5 mV
Lead Channel Setting Pacing Amplitude: 2 V
Lead Channel Setting Pacing Pulse Width: 0.4 ms
MDC IDC MSMT LEADCHNL RV IMPEDANCE VALUE: 380 Ohm
MDC IDC MSMT LEADCHNL RV IMPEDANCE VALUE: 475 Ohm
MDC IDC MSMT LEADCHNL RV SENSING INTR AMPL: 12.625 mV
MDC IDC SET LEADCHNL RV PACING AMPLITUDE: 2.5 V
MDC IDC SET LEADCHNL RV SENSING SENSITIVITY: 0.9 mV
MDC IDC SET ZONE DETECTION INTERVAL: 390 ms
MDC IDC STAT BRADY AP VP PERCENT: 21.54 %
MDC IDC STAT BRADY RV PERCENT PACED: 96.08 %
Zone Setting Detection Interval: 390 ms

## 2014-11-20 MED ORDER — AMLODIPINE BESYLATE 5 MG PO TABS: 5.0000 mg | ORAL_TABLET | Freq: Every day | ORAL | Status: DC

## 2014-11-20 NOTE — Patient Instructions (Signed)

## 2014-11-20 NOTE — Progress Notes (Signed)
kf      Patient Care Team: Horton Finer, MD as PCP - General (Internal Medicine)   HPI  [name] is a 69 y.o. male Seen in followup for atrial arrhythmias for which he takes no antiarrhythmic currently. He is s/p PVI at [facility]  he remains at modestly impaired quality of life but notes no significant changes week to week but does have better days and worse days. We attempted to correlate symptoms with exercise tolerance. He brings in a calendar elbows do this. It is interesting that he has spent many weeks out of rhythm without associated palpitations. There were symptoms associated with reversion to sinus and atrial flutter that occurred in mid February.  He has a history of coronary artery disease with prior stenting  At the last visit with Dr. Philmore Pali prior to his retirement it was elected to use aspirin and a NOAC and Rivaroxaban was started   Past Medical History  Diagnosis Date  . Hyperlipidemia   . Pulsatile tinnitus   . 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  . Familial tremor   . Hypertension   . Benign paroxysmal vertigo   . Heart murmur   . Atrial flutter -atypical 2007    ABLATION x2 [facility]  . Atrial tachycardia 2007    ABLATION [facility]  . First degree AV block   . Tachy-brady syndrome     a. developed after RFCA @ [facility] 2013-> MDT PPM;  b. s/p RA lead revision 05/2012  . History of blood transfusion 2010    "2nd day after prostatectomy"  . Migraines   . Prostate cancer   . Carcinoma     "off my chest"  . CAD (coronary artery disease)     a. abnl mv 01/2012;  b. cath 05/23/2012 LCX 80@ bifurcation w/ OM, 95% @ AV groove, otw nonobs dzs, EF 55-65%;    Past Surgical History  Procedure Laterality Date  . Prostatectomy  12/21/537    COMPLICATED  . Shoulder surgery      Bilateral; "boney spurs removed"  . Atrial ablation surgery  04/01/12    "4th time I had one"  . Colonoscopy    . Cardioversion  12/03/2011    Procedure:  CARDIOVERSION;  Surgeon: [name], MD;  Location: [place];  Service: Cardiovascular;  Laterality: [name];  . Cardioversion  01/13/2012    Procedure: CARDIOVERSION;  Surgeon: Lelon Perla, MD;  Location: [place];  Service: Cardiovascular;  Laterality: [name];  . Cardioversion  04/27/2012    Procedure: CARDIOVERSION;  Surgeon: Darlin Coco, MD;  Location: [place];  Service: Cardiovascular;  Laterality: [name];  . Insert / replace / remove pacemaker  03/2012    initial placement  . Insert / replace / remove pacemaker  05/25/12    "atrial lead change"  . Inguinal hernia repair  1945    left side  . Hernia repair  1980's  . Tonsillectomy and adenoidectomy      "as a child"  . Skin cancer excision      "in situ; right chest"  . Cardiac catheterization    . Coronary angioplasty with stent placement  06/02/12    "1; first one ever"  . Lead revision [name] 05/25/2012    Procedure: LEAD REVISION;  Surgeon: Evans Lance, MD;  Location: [facility] CATH LAB;  Service: Cardiovascular;  Laterality: [name];  . Percutaneous coronary stent intervention (pci-s) [name] 06/02/2012    Procedure: PERCUTANEOUS CORONARY STENT INTERVENTION (  PCI-S);  Surgeon: Hillary Bow, MD;  Location: [facility] CATH LAB;  Service: Cardiovascular;  Laterality: [name];    Current Outpatient Prescriptions  Medication Sig Dispense Refill  . acyclovir (ZOVIRAX) 200 MG capsule Take 200 mg by mouth 2 (two) times daily as needed. For prophylaxis    . amLODipine (NORVASC) 5 MG tablet Take 5 mg by mouth daily.     Marland Kitchen aspirin EC 81 MG tablet Take 81 mg by mouth daily.    . cholecalciferol (VITAMIN D) 1000 UNITS tablet Take 1,000 Units by mouth daily.    . nitroGLYCERIN (NITROSTAT) 0.4 MG SL tablet Place 1 tablet (0.4 mg total) under the tongue every 5 (five) minutes as needed for chest pain. 25 tablet 3  . propranolol (INDERAL) 10 MG tablet Take 1 tablet (10 mg total) by mouth 3 (three) times daily as needed. (Patient taking differently: Take 10 mg by mouth 3  (three) times daily as needed (for tremor, migranes and tachycardia.). ) 90 tablet 11  . Rivaroxaban (XARELTO) 20 MG TABS tablet Take 1 tablet (20 mg total) by mouth daily with supper. 30 tablet 11  . vitamin B-12 (CYANOCOBALAMIN) 1000 MCG tablet Take 1,000 mcg by mouth daily.     No current facility-administered medications for this visit.    Allergies  Allergen Reactions  . Ceclor [Cefaclor] Hives  . Chlorhexidine Itching and Rash  . Statins Other (See Comments)    Muscle weakness  . Ace Inhibitors Other (See Comments)    Does not take due to "angiotensin is responsible for maintaining bladder tone"    Review of Systems negative except from HPI and PMH  Physical Exam BP 132/86 mmHg  Pulse 68  Ht 6' (1.829 m)  Wt 195 lb 3.2 oz (88.542 kg)  BMI 26.47 kg/m2 Well developed and well nourished in no acute distress HENT normal E scleral and icterus clear Neck Supple JVP flat; carotids brisk and full Clear to ausculation  Regular rate and rhythm, 2/6 early systolic murmur Soft with active bowel sounds No clubbing cyanosis  Edema Alert and oriented, grossly normal motor and sensory function Skin Warm and Dry    Assessment and  Plan  Atrial flutter   Atrial fibrillation s/p ablation [facility] x2  Sinus node dysfunction  Orthostatic lightheadedness  Pacemaker Medtronic  The patient's device was interrogated.  The information was reviewed. No changes were made in the programming.    Coronary artery disease s/p  DES stenting 2013  The patient has largely asymptomatic atrial flutter. At this juncture we will not pursue further ablation. He will use of propranolol in anticipation of exercise as this is the one place he is noted some elevated heart rates.  The decision to continue him on double therapy was made by him and Dr. Philmore Pali. We will not interrupt this plan.  It is primarily a problem in the morning. He will talk with his wife about raising the head of his bed. We reviewed  the physiology of orthostatic intolerance.

## 2015-01-30 ENCOUNTER — Other Ambulatory Visit: Payer: Self-pay | Admitting: Cardiovascular Disease

## 2015-04-12 ENCOUNTER — Ambulatory Visit (INDEPENDENT_AMBULATORY_CARE_PROVIDER_SITE_OTHER): Payer: Medicare Other | Admitting: *Deleted

## 2015-04-12 DIAGNOSIS — I471 Supraventricular tachycardia: Secondary | ICD-10-CM

## 2015-04-12 DIAGNOSIS — I440 Atrioventricular block, first degree: Secondary | ICD-10-CM | POA: Diagnosis not present

## 2015-04-12 DIAGNOSIS — I4892 Unspecified atrial flutter: Secondary | ICD-10-CM

## 2015-04-12 LAB — CUP PACEART INCLINIC DEVICE CHECK
Battery Remaining Longevity: 56 mo
Battery Voltage: 2.99 V
Brady Statistic AP VS Percent: 0.28 %
Brady Statistic AS VP Percent: 76.48 %
Brady Statistic AS VS Percent: 1.77 %
Brady Statistic RA Percent Paced: 21.75 %
Lead Channel Impedance Value: 361 Ohm
Lead Channel Impedance Value: 437 Ohm
Lead Channel Impedance Value: 475 Ohm
Lead Channel Sensing Intrinsic Amplitude: 3.25 mV
Lead Channel Setting Pacing Amplitude: 2.25 V
Lead Channel Setting Sensing Sensitivity: 0.9 mV
MDC IDC MSMT LEADCHNL RA IMPEDANCE VALUE: 380 Ohm
MDC IDC MSMT LEADCHNL RA PACING THRESHOLD AMPLITUDE: 1.125 V
MDC IDC MSMT LEADCHNL RA PACING THRESHOLD PULSEWIDTH: 0.4 ms
MDC IDC MSMT LEADCHNL RA SENSING INTR AMPL: 4.25 mV
MDC IDC MSMT LEADCHNL RV PACING THRESHOLD AMPLITUDE: 0.75 V
MDC IDC MSMT LEADCHNL RV PACING THRESHOLD PULSEWIDTH: 0.4 ms
MDC IDC MSMT LEADCHNL RV SENSING INTR AMPL: 10.625 mV
MDC IDC MSMT LEADCHNL RV SENSING INTR AMPL: 13.75 mV
MDC IDC SESS DTM: 20160527143620
MDC IDC SET LEADCHNL RV PACING AMPLITUDE: 2.5 V
MDC IDC SET LEADCHNL RV PACING PULSEWIDTH: 0.4 ms
MDC IDC SET ZONE DETECTION INTERVAL: 390 ms
MDC IDC STAT BRADY AP VP PERCENT: 21.46 %
MDC IDC STAT BRADY RV PERCENT PACED: 97.95 %
Zone Setting Detection Interval: 390 ms

## 2015-04-12 NOTE — Progress Notes (Signed)
Pacemaker check in clinic. Normal device function. Thresholds, sensing, impedances consistent with previous measurements. Device programmed to maximize longevity. 784 AT/AF episodes (78.9%)--max dur. 6 days + Xarelto. No high ventricular rates noted. Device programmed at appropriate safety margins. Histogram distribution appropriate for patient activity level. Device programmed to optimize intrinsic conduction. Estimated longevity 4.5 years. Patient will follow up w/SK on 6/29 @0830 .

## 2015-05-01 ENCOUNTER — Other Ambulatory Visit: Payer: Self-pay

## 2015-05-01 MED ORDER — RIVAROXABAN 20 MG PO TABS: ORAL_TABLET | ORAL | Status: DC

## 2015-05-13 ENCOUNTER — Other Ambulatory Visit: Payer: Self-pay

## 2015-05-13 ENCOUNTER — Encounter: Payer: Self-pay | Admitting: Internal Medicine

## 2015-05-15 ENCOUNTER — Encounter: Payer: Self-pay | Admitting: Internal Medicine

## 2015-05-15 ENCOUNTER — Ambulatory Visit (INDEPENDENT_AMBULATORY_CARE_PROVIDER_SITE_OTHER): Payer: Medicare Other | Admitting: Internal Medicine

## 2015-05-15 VITALS — BP 109/70 | HR 64 | Ht 71.5 in | Wt 193.2 lb

## 2015-05-15 DIAGNOSIS — Z45018 Encounter for adjustment and management of other part of cardiac pacemaker: Secondary | ICD-10-CM | POA: Diagnosis not present

## 2015-05-15 DIAGNOSIS — I4892 Unspecified atrial flutter: Secondary | ICD-10-CM | POA: Diagnosis not present

## 2015-05-15 DIAGNOSIS — I495 Sick sinus syndrome: Secondary | ICD-10-CM

## 2015-05-15 NOTE — Patient Instructions (Addendum)
Medication Instructions:  Your physician recommends that you continue on your current medications as directed. Please refer to the Current Medication list given to you today.  Labwork: None ordered  Testing/Procedures: None ordered  Follow-Up: Remote monitoring is used to monitor your Pacemaker of ICD from home. This monitoring reduces the number of office visits required to check your device to one time per year. It allows [place] to keep an eye on the functioning of your device to ensure it is working properly. You are scheduled for a device check from home on 08/14/15. You may send your transmission at any time that day. If you have a wireless device, the transmission will be sent automatically. After your physician reviews your transmission, you will receive a postcard with your next transmission date.  Your physician wants you to follow-up in: January 2017 with Dr. Caryl Comes.  You will receive a reminder letter in the mail two months in advance. If you don't receive a letter, please call our office to schedule the follow-up appointment.  Thank you for choosing [facility]!!

## 2015-05-15 NOTE — Progress Notes (Signed)
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 He is s/p PVI at [facility]  he remains at modestly impaired quality of life but notes no significant changes week to week but does have better days and worse days. We attempted to correlate symptoms with exercise tolerance. He brings in a calendar elbows do this. It is interesting that he has spent many weeks out of rhythm without associated palpitations. There were symptoms associated with reversion to sinus and atrial flutter that occurred in mid February.  He has a history of coronary artery disease with prior stenting  At the last visit with Dr. Philmore Pali prior to his retirement it was elected to use aspirin and a NOAC and Rivaroxaban was started  He has had some palpitations of late. They have resolved.   Past Medical History  Diagnosis Date  . Hyperlipidemia   . Pulsatile tinnitus   . 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  . Familial tremor   . Hypertension   . Benign paroxysmal vertigo   . Heart murmur   . Atrial flutter -atypical 2007    ABLATION x2 [facility]  . Atrial tachycardia 2007    ABLATION [facility]  . First degree AV block   . Tachy-brady syndrome     a. developed after RFCA @ [facility] 2013-> MDT PPM;  b. s/p RA lead revision 05/2012  . History of blood transfusion 2010    "2nd day after prostatectomy"  . Migraines   . Prostate cancer   . Carcinoma     "off my chest"  . CAD (coronary artery disease)     a. abnl mv 01/2012;  b. cath 05/23/2012 LCX 80@ bifurcation w/ OM, 95% @ AV groove, otw nonobs dzs, EF 55-65%;    Past Surgical History  Procedure Laterality Date  . Prostatectomy  12/23/784    COMPLICATED  . Shoulder surgery      Bilateral; "boney spurs removed"  . Atrial ablation surgery  04/01/12    "4th time I had one"  . Colonoscopy      . Cardioversion  12/03/2011    Procedure: CARDIOVERSION;  Surgeon: [name], MD;  Location: [place];  Service: Cardiovascular;  Laterality: [name];  . Cardioversion  01/13/2012    Procedure: CARDIOVERSION;  Surgeon: Lelon Perla, MD;  Location: [place];  Service: Cardiovascular;  Laterality: [name];  . Cardioversion  04/27/2012    Procedure: CARDIOVERSION;  Surgeon: Darlin Coco, MD;  Location: [place];  Service: Cardiovascular;  Laterality: [name];  . Insert / replace / remove pacemaker  03/2012    initial placement  . Insert / replace / remove pacemaker  05/25/12    "atrial lead change"  . Inguinal hernia repair  1945    left side  . Hernia repair  1980's  . Tonsillectomy and adenoidectomy      "as a child"  . Skin cancer excision      "in situ; right chest"  . Cardiac catheterization    . Coronary angioplasty with stent placement  06/02/12    "1; first one ever"  . Lead revision [name] 05/25/2012    Procedure: LEAD REVISION;  Surgeon: Evans Lance, MD;  Location: [facility] CATH LAB;  Service: Cardiovascular;  Laterality: [name];  . Percutaneous coronary stent  intervention (pci-s) [name] 06/02/2012    Procedure: PERCUTANEOUS CORONARY STENT INTERVENTION (PCI-S);  Surgeon: Hillary Bow, MD;  Location: [facility] CATH LAB;  Service: Cardiovascular;  Laterality: [name];    Current Outpatient Prescriptions  Medication Sig Dispense Refill  . acyclovir (ZOVIRAX) 200 MG capsule Take 200 mg by mouth 2 (two) times daily as needed. For prophylaxis    . amLODipine (NORVASC) 5 MG tablet Take 1 tablet (5 mg total) by mouth daily. 30 tablet 11  . aspirin EC 81 MG tablet Take 81 mg by mouth daily.    . cholecalciferol (VITAMIN D) 1000 UNITS tablet Take 1,000 Units by mouth daily.    . nitroGLYCERIN (NITROSTAT) 0.4 MG SL tablet Place 1 tablet (0.4 mg total) under the tongue every 5 (five) minutes as needed for chest pain. 25 tablet 3  . propranolol (INDERAL) 10 MG tablet Take 10 mg by mouth 3 (three) times daily as needed  (arrhythmias, tremor, migraines.Marland Kitchen).    Marland Kitchen rivaroxaban (XARELTO) 20 MG TABS tablet TAKE 1 TABLET (20 MG TOTAL) BY MOUTH DAILY WITH SUPPER. 30 tablet 6  . vitamin B-12 (CYANOCOBALAMIN) 1000 MCG tablet Take 1,000 mcg by mouth daily.     No current facility-administered medications for this visit.    Allergies  Allergen Reactions  . Ceclor [Cefaclor] Hives  . Chlorhexidine Itching and Rash  . Statins Other (See Comments)    Muscle weakness  . Ace Inhibitors Other (See Comments)    Does not take due to "angiotensin is responsible for maintaining bladder tone"    Review of Systems negative except from HPI and PMH  Physical Exam BP 109/70 mmHg  Pulse 64  Ht 5' 11.5" (1.816 m)  Wt 193 lb 3.2 oz (87.635 kg)  BMI 26.57 kg/m2 Well developed and well nourished in no acute distress HENT normal E scleral and icterus clear Neck Supple Device pocket well healed; without hematoma or erythema.  There is no tethering  JVP flat; carotids brisk and full Clear to ausculation  Regular rate and rhythm, 2/6 early systolic murmur Soft with active bowel sounds No clubbing cyanosis  Edema Alert and oriented, grossly normal motor and sensory function Skin Warm and Dry  ECG demonstrates Atrial tachycardia at 360 ms with complete heart block and ventricular pacing with a rate of 64 Intervals-/18/45 Axis left -49 Assessment and  Plan  Atrial flutter   Atrial fibrillation s/p ablation [facility] x2  Sinus node dysfunction  Orthostatic lightheadedness  Pacemaker Medtronic  The patient's device was interrogated.  The information was reviewed. No changes were made in the programming.    Coronary artery disease s/p  DES stenting 2013  The patient has largely asymptomatic atrial flutter  He has symptoms of palpitations a couple of weeks ago when he saw AS. It is not clear to me what the mechanism of that was. Device interrogation had demonstrated a change in ventricular pacing percentage from 99-95.  There were some PVCs on device interrogation. He also has some variability in his heart rhythms and perhaps more rapid atrial rates associated with less atrial contribution of contributing to his symptoms.  At this juncture blood pressure is well-controlled we will leave him on his current medications.  Without symptoms of ischemia\

## 2015-05-22 ENCOUNTER — Encounter: Payer: Self-pay | Admitting: Internal Medicine

## 2015-05-24 ENCOUNTER — Other Ambulatory Visit: Payer: Self-pay | Admitting: Cardiovascular Disease

## 2015-07-19 ENCOUNTER — Encounter: Payer: Self-pay | Admitting: Cardiovascular Disease

## 2015-07-19 ENCOUNTER — Ambulatory Visit (INDEPENDENT_AMBULATORY_CARE_PROVIDER_SITE_OTHER): Payer: Medicare Other | Admitting: Cardiovascular Disease

## 2015-07-19 VITALS — BP 106/80 | HR 68 | Ht 71.5 in | Wt 194.4 lb

## 2015-07-19 DIAGNOSIS — I25118 Atherosclerotic heart disease of native coronary artery with other forms of angina pectoris: Secondary | ICD-10-CM | POA: Diagnosis not present

## 2015-07-19 NOTE — Progress Notes (Signed)
Cardiology Office Note Date:  07/19/2015   ID:  Walter Mt, [name], DOB 03-24-1946, MRN [id number]  PCP:  Irven Shelling, [name]  Cardiologist:  [name], [name]    Chief Complaint  Patient presents with  . Fatigue    History of Present Illness: Walter Mt, [name] is a 68 y.o. male who presents for follow-up evaluation.  The patient is followed for coronary artery disease, atrial flutter with history of tachybradycardia syndrome and permanent pacemaker placement.  He underwent cardiac catheterization last in 2013 demonstrating high-grade bifurcational disease involving the left circumflex/OM 1. He was noted to have moderate RCA stenosis and minor nonobstructive LAD stenosis at that time. He underwent PCI of the left circumflex with drug-eluting stent placement into a large obtuse marginal. There was side branch occlusion with collateral filling at the completion of the procedure.   the patient reports no significant change in his cardiac symptoms. He continues to have mild chest discomfort with emotional stress. He is physically active and does not have exertional angina. He walks at least 30 minutes every day and also does physical work. He denies shortness of breath , edema, orthopnea, or PND. He does have episodes of palpitations and fatigue. At his last assessment at [facility]  He was told that he had significant fibrosis of his interatrial septum and was likely to have recurrent atrial fibrillation , so he elected to not undergo another ablation procedure.  Past Medical History  Diagnosis Date  . Hyperlipidemia   . Pulsatile tinnitus   . 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  . Familial tremor   . Hypertension   . Benign paroxysmal vertigo   . Heart murmur   . Atrial flutter -atypical 2007    ABLATION x2 [facility]  . Atrial tachycardia 2007    ABLATION [facility]  . First degree AV block   . Tachy-brady syndrome     a. developed after RFCA @ [facility] 2013-> MDT PPM;   b. s/p RA lead revision 05/2012  . History of blood transfusion 2010    "2nd day after prostatectomy"  . Migraines   . Prostate cancer   . Carcinoma     "off my chest"  . CAD (coronary artery disease)     a. abnl mv 01/2012;  b. cath 05/23/2012 LCX 80@ bifurcation w/ OM, 95% @ AV groove, otw nonobs dzs, EF 55-65%;    Past Surgical History  Procedure Laterality Date  . Prostatectomy  05/25/239    COMPLICATED  . Shoulder surgery      Bilateral; "boney spurs removed"  . Atrial ablation surgery  04/01/12    "4th time I had one"  . Colonoscopy    . Cardioversion  12/03/2011    Procedure: CARDIOVERSION;  Surgeon: [name], [name];  Location: [place];  Service: Cardiovascular;  Laterality: [name];  . Cardioversion  01/13/2012    Procedure: CARDIOVERSION;  Surgeon: Lelon Perla, [name];  Location: [place];  Service: Cardiovascular;  Laterality: [name];  . Cardioversion  04/27/2012    Procedure: CARDIOVERSION;  Surgeon: Darlin Coco, [name];  Location: [place];  Service: Cardiovascular;  Laterality: [name];  . Insert / replace / remove pacemaker  03/2012    initial placement  . Insert / replace / remove pacemaker  05/25/12    "atrial lead change"  . Inguinal hernia repair  1945    left side  . Hernia repair  1980's  . Tonsillectomy and adenoidectomy      "  as a child"  . Skin cancer excision      "in situ; right chest"  . Cardiac catheterization    . Coronary angioplasty with stent placement  06/02/12    "1; first one ever"  . Lead revision [name] 05/25/2012    Procedure: LEAD REVISION;  Surgeon: Evans Lance, [name];  Location[facility] C[facility] LAB;  Service: Cardiovascular;  Laterality: [name];  . Percutaneous coronary stent intervention (pci-s) [name] 06/02/2012    Procedure: PERCUTANEOUS CORONARY STENT INTERVENTION (PCI-S);  Surgeon: Hillary Bow, [name];  Location: [facility] CATH LAB;  Service: Cardiovascular;  Laterality: [name];    Current Outpatient Prescriptions  Medication Sig Dispense Refill  . acyclovir (ZOVIRAX) 200 MG  capsule Take 200 mg by mouth 2 (two) times daily as needed. For prophylaxis    . amLODipine (NORVASC) 5 MG tablet Take 1 tablet (5 mg total) by mouth daily. 30 tablet 11  . aspirin EC 81 MG tablet Take 81 mg by mouth daily.    . cholecalciferol (VITAMIN D) 1000 UNITS tablet Take 1,000 Units by mouth daily.    . propranolol (INDERAL) 10 MG tablet Take 10 mg by mouth 3 (three) times daily as needed (arrhythmias, tremor, migraines.Marland Kitchen).    Marland Kitchen rivaroxaban (XARELTO) 20 MG TABS tablet TAKE 1 TABLET (20 MG TOTAL) BY MOUTH DAILY WITH SUPPER. 30 tablet 6  . vitamin B-12 (CYANOCOBALAMIN) 1000 MCG tablet Take 1,000 mcg by mouth daily.    . nitroGLYCERIN (NITROSTAT) 0.4 MG SL tablet Place 1 tablet (0.4 mg total) under the tongue every 5 (five) minutes as needed for chest pain. 25 tablet 3   No current facility-administered medications for this visit.    Allergies:   Ceclor; Chlorhexidine; Statins; and Ace inhibitors   Social History:  The patient  reports that he has never smoked. He has never used smokeless tobacco. He reports that he drinks about 1.5 oz of alcohol per week. He reports that he does not use illicit drugs.   Family History:  The patient's  family history includes Colon cancer in his sister; Diabetes in his brother; Heart disease in his brother and father.    ROS:  Please see the history of present illness.  All other systems are reviewed and negative.    PHYSICAL EXAM: VS:  BP 106/80 mmHg  Pulse 68  Ht 5' 11.5" (1.816 m)  Wt 194 lb 6.4 oz (88.179 kg)  BMI 26.74 kg/m2  SpO2 99% , BMI Body mass index is 26.74 kg/(m^2). GEN: Well nourished, well developed, in no acute distress HEENT: normal Neck: no JVD, no masses. No carotid bruits Cardiac: RRR without murmur or gallop                Respiratory:  clear to auscultation bilaterally, normal work of breathing GI: soft, nontender, nondistended, + BS MS: no deformity or atrophy Ext: no pretibial edema, pedal pulses 2+=  bilaterally Skin: warm and dry, no rash Neuro:  Strength and sensation are intact Psych: euthymic mood, full affect  EKG:  EKG is not ordered today.  Recent Labs: No results found for requested labs within last 365 days.   Lipid Panel  No results found for: CHOL, TRIG, HDL, CHOLHDL, VLDL, LDLCALC, LDLDIRECT    Wt Readings from Last 3 Encounters:  07/19/15 194 lb 6.4 oz (88.179 kg)  05/15/15 193 lb 3.2 oz (87.635 kg)  11/20/14 195 lb 3.2 oz (88.542 kg)     Cardiac Studies Reviewed: 2D Echo 2014: Study Conclusions  -  Left ventricle: Septal hypokinesis consistent with conduction delay. The cavity size was normal. Wall thickness was normal. Systolic function was normal. The estimated ejection fraction was in the range of 55% to 60%. - Mitral valve: Mild regurgitation. - Pulmonary arteries: PA peak pressure: 56mm Hg (S).  ASSESSMENT AND PLAN: 1.  CAD, native vessel:  Stable, CCS class II angina. The patient is not limited as he does not have any exertional symptoms. He will continue on his current medical program which includes low-dose aspirin and amlodipine, propranolol.  He had bifurcation all disease of the left circumflex and underwent PCI with residual side branch occlusion which likely explains his ongoing symptoms of angina.  2. Atrial fibrillation/flutter:  Followed by Dr. Caryl Comes. Anticoagulated with Xarelto.   Current medicines are reviewed with the patient today.  The patient does not have concerns regarding medicines.  Labs/ tests ordered today include:  No orders of the defined types were placed in this encounter.    Disposition:   FU one year  Signed, [name], [name]  07/19/2015 10:09 AM    [name], [place], [place]  [address] Phone: [phone]; Fax: [phone]

## 2015-07-19 NOTE — Patient Instructions (Signed)

## 2015-07-20 ENCOUNTER — Other Ambulatory Visit: Payer: Self-pay | Admitting: Internal Medicine

## 2015-08-14 ENCOUNTER — Encounter: Payer: Self-pay | Admitting: Internal Medicine

## 2015-08-14 ENCOUNTER — Ambulatory Visit (INDEPENDENT_AMBULATORY_CARE_PROVIDER_SITE_OTHER): Payer: Medicare Other | Admitting: *Deleted

## 2015-08-14 DIAGNOSIS — I495 Sick sinus syndrome: Secondary | ICD-10-CM | POA: Diagnosis not present

## 2015-08-15 NOTE — Progress Notes (Signed)
Remote pacemaker transmission.   

## 2015-08-26 LAB — CUP PACEART REMOTE DEVICE CHECK
Battery Voltage: 2.99 V
Brady Statistic AP VP Percent: 19.87 %
Brady Statistic AS VP Percent: 77.7 %
Brady Statistic RA Percent Paced: 19.93 %
Lead Channel Impedance Value: 380 Ohm
Lead Channel Impedance Value: 456 Ohm
Lead Channel Pacing Threshold Amplitude: 0.75 V
Lead Channel Pacing Threshold Pulse Width: 0.4 ms
Lead Channel Pacing Threshold Pulse Width: 0.4 ms
Lead Channel Sensing Intrinsic Amplitude: 10.75 mV
Lead Channel Sensing Intrinsic Amplitude: 10.75 mV
Lead Channel Setting Pacing Amplitude: 2 V
Lead Channel Setting Sensing Sensitivity: 0.9 mV
MDC IDC MSMT BATTERY REMAINING LONGEVITY: 48 mo
MDC IDC MSMT LEADCHNL RA IMPEDANCE VALUE: 380 Ohm
MDC IDC MSMT LEADCHNL RA IMPEDANCE VALUE: 475 Ohm
MDC IDC MSMT LEADCHNL RA SENSING INTR AMPL: 2.125 mV
MDC IDC MSMT LEADCHNL RA SENSING INTR AMPL: 2.125 mV
MDC IDC MSMT LEADCHNL RV PACING THRESHOLD AMPLITUDE: 0.875 V
MDC IDC SESS DTM: 20160928130323
MDC IDC SET LEADCHNL RV PACING AMPLITUDE: 2.5 V
MDC IDC SET LEADCHNL RV PACING PULSEWIDTH: 0.4 ms
MDC IDC SET ZONE DETECTION INTERVAL: 390 ms
MDC IDC STAT BRADY AP VS PERCENT: 0.06 %
MDC IDC STAT BRADY AS VS PERCENT: 2.37 %
MDC IDC STAT BRADY RV PERCENT PACED: 97.57 %
Zone Setting Detection Interval: 390 ms

## 2015-08-27 ENCOUNTER — Encounter: Payer: Self-pay | Admitting: Cardiology

## 2015-09-10 ENCOUNTER — Encounter: Payer: Self-pay | Admitting: Cardiology

## 2015-09-25 ENCOUNTER — Encounter: Payer: Self-pay | Admitting: Cardiology

## 2015-10-15 ENCOUNTER — Encounter: Payer: Self-pay | Admitting: Cardiology

## 2015-11-29 ENCOUNTER — Ambulatory Visit
Admission: RE | Admit: 2015-11-29 | Discharge: 2015-11-29 | Disposition: A | Discharge status: Home / Self Care | Payer: Medicare Other | Source: Ambulatory Visit | Attending: Urology | Admitting: Urology

## 2015-11-29 ENCOUNTER — Other Ambulatory Visit: Payer: Self-pay | Admitting: Urology

## 2015-11-29 DIAGNOSIS — R52 Pain, unspecified: Secondary | ICD-10-CM

## 2015-11-29 MED ORDER — IOPAMIDOL (ISOVUE-300) INJECTION 61%
100.0000 mL | Freq: Once | INTRAVENOUS | Status: AC | PRN
  Administered 2015-11-29: 100 mL via INTRAVENOUS

## 2015-11-29 NOTE — Progress Notes (Signed)
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 He is s/p PVI X 2 at [facility]  He has had some variability in functional status, but at last visit were not able to demonstrate correlation between arrhythmia and symptoms     He has a history of coronary artery disease with prior stenting; per TDS  it was elected to use aspirin and a NOAC and Rivaroxaban was started   he had an episode of arm pain the other day during an episode of anger. His functional status is noted has gradually declined over recent months; this is confirmed by looking at his paHcemaker activity sensor. There has been no shortness of breath or edema.          Past Medical History  Diagnosis Date  . Hyperlipidemia   . Pulsatile tinnitus   . 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  . Familial tremor   . Hypertension   . Benign paroxysmal vertigo   . Heart murmur   . Atrial flutter -atypical 2007    ABLATION x2 [facility]  . Atrial tachycardia 2007    ABLATION [facility]  . First degree AV block   . Tachy-brady syndrome     a. developed after RFCA @ [facility] 2013-> MDT PPM;  b. s/p RA lead revision 05/2012  . History of blood transfusion 2010    "2nd day after prostatectomy"  . Migraines   . Prostate cancer   . Carcinoma     "off my chest"  . CAD (coronary artery disease)     a. abnl mv 01/2012;  b. cath 05/23/2012 LCX 80@ bifurcation w/ OM, 95% @ AV groove, otw nonobs dzs, EF 55-65%;    Past Surgical History  Procedure Laterality Date  . Prostatectomy  [id number]    COMPLICATED  . Shoulder surgery      Bilateral; "boney spurs removed"  . Atrial ablation surgery  04/01/12    "4th time I had one"  . Colonoscopy    . Cardioversion  12/03/2011    Procedure: CARDIOVERSION;  Surgeon: [name], MD;  Location: [place];  Service:  Cardiovascular;  Laterality: [name];  . Cardioversion  01/13/2012    Procedure: CARDIOVERSION;  Surgeon: Lelon Perla, MD;  Location: [place];  Service: Cardiovascular;  Laterality: [name];  . Cardioversion  04/27/2012    Procedure: CARDIOVERSION;  Surgeon: Darlin Coco, MD;  Location: [place];  Service: Cardiovascular;  Laterality: [name];  . Insert / replace / remove pacemaker  03/2012    initial placement  . Insert / replace / remove pacemaker  05/25/12    "atrial lead change"  . Inguinal hernia repair  1945    left side  . Hernia repair  1980's  . Tonsillectomy and adenoidectomy      "as a child"  . Skin cancer excision      "in situ; right chest"  . Cardiac catheterization    . Coronary angioplasty with stent placement  06/02/12    "1; first one ever"  . Lead revision [name] 05/25/2012    Procedure: LEAD REVISION;  Surgeon: Evans Lance, MD;  Location: [facility] CATH LAB;  Service: Cardiovascular;  Laterality: [name];  . Percutaneous coronary stent intervention (pci-s) [name] 06/02/2012    Procedure: PERCUTANEOUS CORONARY STENT  INTERVENTION (PCI-S);  Surgeon: Hillary Bow, MD;  Location: [facility] CATH LAB;  Service: Cardiovascular;  Laterality: [name];    Current Outpatient Prescriptions  Medication Sig Dispense Refill  . acyclovir (ZOVIRAX) 200 MG capsule Take 200 mg by mouth 2 (two) times daily as needed. For prophylaxis    . amLODipine (NORVASC) 5 MG tablet Take 1 tablet (5 mg total) by mouth daily. 30 tablet 11  . aspirin EC 81 MG tablet Take 81 mg by mouth daily.    . cholecalciferol (VITAMIN D) 1000 UNITS tablet Take 1,000 Units by mouth daily.    Marland Kitchen NITROSTAT 0.4 MG SL tablet PLACE 1 TABLET (0.4 MG TOTAL) UNDER THE TONGUE EVERY 5 (FIVE) MINUTES AS NEEDED FOR CHEST PAIN. 25 tablet 1  . propranolol (INDERAL) 10 MG tablet Take 10 mg by mouth 3 (three) times daily as needed (arrhythmias, tremor, migraines.Marland Kitchen).    Marland Kitchen rivaroxaban (XARELTO) 20 MG TABS tablet TAKE 1 TABLET (20 MG TOTAL) BY MOUTH DAILY WITH  SUPPER. 30 tablet 6  . vitamin B-12 (CYANOCOBALAMIN) 1000 MCG tablet Take 1,000 mcg by mouth daily.     No current facility-administered medications for this visit.    Allergies  Allergen Reactions  . Ceclor [Cefaclor] Hives  . Chlorhexidine Itching and Rash  . Statins Other (See Comments)    Muscle weakness  . Ace Inhibitors Other (See Comments)    Does not take due to "angiotensin is responsible for maintaining bladder tone"    Review of Systems negative except from HPI and PMH  Physical Exam  BP 128/82 mmHg  Pulse 62  Ht 6' (1.829 m)  Wt 197 lb (89.359 kg)  BMI 26.71 kg/m2  Well developed and well nourished in no acute distress HENT normal E scleral and icterus clear Neck Supple Device pocket well healed; without hematoma or erythema.  There is no tethering  JVP flat; carotids brisk and full Clear to ausculation  Regular rate and rhythm, 2/6 early systolic murmur Soft with active bowel sounds No clubbing cyanosis  Edema Alert and oriented, grossly normal motor and sensory function Skin Warm and Dry  ECG demonstrates Atrial tachycardia at 360 ms with complete heart block and ventricular pacing with a rate of 64 Intervals-/18/45 Axis left -49 Assessment and  Plan  Atrial flutter   Atrial fibrillation s/p ablation [facility] x2  Sinus node dysfunction  Orthostatic lightheadedness  Pacemaker Medtronic  The patient's device was interrogated.  The information was reviewed. No changes were made in the programming.    Coronary artery disease s/p  DES stenting 2013  The patient has largely asymptomatic atrial flutter. There  Has been progressive fatigue  This may be related to medicines, Vpacing effect on LV function is concernng  : He also had an episode of chest pain. These 2 things together prompt utilizing Myoview scanning.  OI is stable    This is fatigue were to persist,I would then after a month have him hold his amlodipine for a month. If again without change,  I would exchange he is  Rivaroxaban for an alternative NOAC area

## 2015-12-02 ENCOUNTER — Encounter: Payer: Self-pay | Admitting: Internal Medicine

## 2015-12-02 ENCOUNTER — Ambulatory Visit (INDEPENDENT_AMBULATORY_CARE_PROVIDER_SITE_OTHER): Payer: Medicare Other | Admitting: Internal Medicine

## 2015-12-02 VITALS — BP 128/82 | HR 62 | Ht 72.0 in | Wt 197.0 lb

## 2015-12-02 DIAGNOSIS — R079 Chest pain, unspecified: Secondary | ICD-10-CM | POA: Diagnosis not present

## 2015-12-02 DIAGNOSIS — I495 Sick sinus syndrome: Secondary | ICD-10-CM

## 2015-12-02 DIAGNOSIS — I4892 Unspecified atrial flutter: Secondary | ICD-10-CM

## 2015-12-02 DIAGNOSIS — Z950 Presence of cardiac pacemaker: Secondary | ICD-10-CM | POA: Diagnosis not present

## 2015-12-02 LAB — CUP PACEART INCLINIC DEVICE CHECK
Battery Remaining Longevity: 45 mo
Battery Voltage: 2.99 V
Brady Statistic AP VS Percent: 0.06 %
Brady Statistic AS VS Percent: 2.38 %
Brady Statistic RV Percent Paced: 97.56 %
Date Time Interrogation Session: 20170116154130
Implantable Lead Implant Date: 20130517
Implantable Lead Location: 753860
Implantable Lead Model: 5086
Implantable Lead Model: 5086
Lead Channel Impedance Value: 380 Ohm
Lead Channel Impedance Value: 399 Ohm
Lead Channel Impedance Value: 475 Ohm
Lead Channel Pacing Threshold Amplitude: 1 V
Lead Channel Pacing Threshold Pulse Width: 0.4 ms
Lead Channel Sensing Intrinsic Amplitude: 1.5 mV
Lead Channel Setting Sensing Sensitivity: 0.9 mV
MDC IDC LEAD IMPLANT DT: 20130710
MDC IDC LEAD LOCATION: 753859
MDC IDC MSMT LEADCHNL RA IMPEDANCE VALUE: 494 Ohm
MDC IDC SET LEADCHNL RA PACING AMPLITUDE: 2 V
MDC IDC SET LEADCHNL RV PACING AMPLITUDE: 2.5 V
MDC IDC SET LEADCHNL RV PACING PULSEWIDTH: 0.4 ms
MDC IDC STAT BRADY AP VP PERCENT: 21.07 %
MDC IDC STAT BRADY AS VP PERCENT: 76.49 %
MDC IDC STAT BRADY RA PERCENT PACED: 21.13 %

## 2015-12-02 MED ORDER — RIVAROXABAN 20 MG PO TABS: ORAL_TABLET | ORAL | Status: DC

## 2015-12-02 NOTE — Patient Instructions (Signed)
Medication Instructions: - no changes  Labwork: - none  Procedures/Testing: - Your physician has requested that you have a lexiscan myoview. For further information please visit [email or web address]. Please follow instruction sheet, as given.  Follow-Up: - Remote monitoring is used to monitor your Pacemaker of ICD from home. This monitoring reduces the number of office visits required to check your device to one time per year. It allows [place] to keep an eye on the functioning of your device to ensure it is working properly. You are scheduled for a device check from home on 03/02/16. You may send your transmission at any time that day. If you have a wireless device, the transmission will be sent automatically. After your physician reviews your transmission, you will receive a postcard with your next transmission date.  - Your physician wants you to follow-up in: 6 months with Dr. Caryl Comes. You will receive a reminder letter in the mail two months in advance. If you don't receive a letter, please call our office to schedule the follow-up appointment.  Any Additional Special Instructions Will Be Listed Below (If Applicable). - call in 1 month to let Dr. Caryl Comes know how you are feeling with the pacing rate being adjusted today.

## 2015-12-03 ENCOUNTER — Telehealth (HOSPITAL_COMMUNITY): Payer: Self-pay | Admitting: *Deleted

## 2015-12-03 ENCOUNTER — Telehealth: Payer: Self-pay | Admitting: Internal Medicine

## 2015-12-03 NOTE — Telephone Encounter (Signed)
Reviewed with Dr. Caryl Comes- ok to proceed with Lexiscan. I have notified the patient of this. He states he spoke with Dr. Laurann Montana as well and is going to be put on augmentin.

## 2015-12-03 NOTE — Telephone Encounter (Signed)
Will forward to Dr. Klein to review. 

## 2015-12-03 NOTE — Telephone Encounter (Signed)
Patient given detailed instructions per Myocardial Perfusion Study Information Sheet for the test on 12/05/15 at 0730. Patient notified to arrive 15 minutes early and that it is imperative to arrive on time for appointment to keep from having the test rescheduled.  If you need to cancel or reschedule your appointment, please call the office within 24 hours of your appointment. Failure to do so may result in a cancellation of your appointment, and a $50 no show fee. Patient verbalized understanding.Koray Soter, Ranae Palms

## 2015-12-03 NOTE — Telephone Encounter (Signed)
New message      Pt is having a nuclear stress test on thurs.  He has acute diverticulitis.  Is there any conflict with having this test?---pt feels ok.  Please call

## 2015-12-05 ENCOUNTER — Ambulatory Visit (HOSPITAL_COMMUNITY): Payer: Medicare Other | Attending: Cardiovascular Disease

## 2015-12-05 DIAGNOSIS — I10 Essential (primary) hypertension: Secondary | ICD-10-CM | POA: Diagnosis not present

## 2015-12-05 DIAGNOSIS — R079 Chest pain, unspecified: Secondary | ICD-10-CM | POA: Diagnosis present

## 2015-12-05 DIAGNOSIS — I517 Cardiomegaly: Secondary | ICD-10-CM | POA: Diagnosis not present

## 2015-12-05 LAB — MYOCARDIAL PERFUSION IMAGING
CSEPPHR: 85 {beats}/min
LVDIAVOL: 130 mL
LVSYSVOL: 69 mL
NUC STRESS TID: 1.08
RATE: 0.25
Rest HR: 75 {beats}/min
SDS: 2
SRS: 2
SSS: 3

## 2015-12-05 MED ORDER — REGADENOSON 0.4 MG/5ML IV SOLN
0.4000 mg | Freq: Once | INTRAVENOUS | Status: AC
  Administered 2015-12-05: 0.4 mg via INTRAVENOUS

## 2015-12-05 MED ORDER — TECHNETIUM TC 99M SESTAMIBI GENERIC - CARDIOLITE
31.4000 | Freq: Once | INTRAVENOUS | Status: AC | PRN
  Administered 2015-12-05: 31 via INTRAVENOUS

## 2015-12-05 MED ORDER — TECHNETIUM TC 99M SESTAMIBI GENERIC - CARDIOLITE
10.9000 | Freq: Once | INTRAVENOUS | Status: AC | PRN
  Administered 2015-12-05: 10.9 via INTRAVENOUS

## 2015-12-26 ENCOUNTER — Other Ambulatory Visit: Payer: Self-pay | Admitting: Internal Medicine

## 2016-01-07 ENCOUNTER — Telehealth: Payer: Self-pay | Admitting: Internal Medicine

## 2016-01-07 NOTE — Telephone Encounter (Signed)
Reviewed with Dr. Caryl Comes- if the patient is feeling well now, stay off amlodipine. Monitor BP and if his SBP is running 135 or above, re-start amlodipine at 2.5 mg once daily. The patient is aware and verbalizes understanding.

## 2016-01-07 NOTE — Telephone Encounter (Signed)
I spoke with [name]- he called to report that he is feeling good a month later after his base rate was increased on his device. He is reporting about 3 significant episodes of orthostatic hypotension over the last month- he did have an episode where he checked his BP with position changes and his SBP dropped 20 points. He has been off of his amlodipine 5 mg tablet for the last 3 days. He is inquiring if he should restart this at 2.5 mg once daily or start back on the 5 mg once daily of amlodipine. He did research what he has been on previously for BP control. He was on: Triam/ HCTZ (37.5/25 mg)- 2012 - he felt good on this, but states he developed headaches and it was discovered he had a low NA+ at the time (124). Nifedipine- 2013- ? Reason this was discontinued Amlodipine- 2013- present.  I advised him I would review this with Dr. Caryl Comes and call him back this evening. He is agreeable. He is leaving at 6 pm for a class, but ok to speak with his wife and leave message with her.

## 2016-01-07 NOTE — Telephone Encounter (Signed)
Please call,he said it was long and very detailed what he need to say.

## 2016-01-24 MED ORDER — AMLODIPINE BESYLATE 2.5 MG PO TABS: 2.5000 mg | ORAL_TABLET | Freq: Every day | ORAL | Status: DC

## 2016-01-24 NOTE — Telephone Encounter (Signed)
Patient dropped off BP readings yesterday. Recent readings "creeping up" (dates: 3/6-3/8): 140/85, 150/85, 132/85, 140/85, 150/85, 130/80, 136/75, 160/90. Normal BP readings in a.m., rising in the afternoon/evening. Advised to restart Amlodipine at 2.5 mg daily. Patient has tablets at home that he will split in half. Patient verbalized understanding and agreeable to plan.

## 2016-01-24 NOTE — Addendum Note (Signed)
Addended by: Stanton Kidney on: 01/24/2016 06:24 PM   Modules accepted: Orders

## 2016-01-30 ENCOUNTER — Telehealth: Payer: Self-pay | Admitting: Internal Medicine

## 2016-01-30 DIAGNOSIS — Z79899 Other long term (current) drug therapy: Secondary | ICD-10-CM

## 2016-01-30 NOTE — Telephone Encounter (Signed)
Spoke with patient who states he was recently advised on 3/10 by Dr. Caryl Comes to restart Amlodipine 2.5 mg for high BP.  Patient previously stopped amlodipine 5 mg due to orthostatic hypotension at 12/02/15 office visit with Dr. Caryl Comes.  He states he has some side effects of poor balance and fogginess with amlodipine 5 mg and would prefer to change to a different agent.  He has hx of prostatectomy and has concerns about ACE inhibitors and ARBs but is willing to start a low dose of these medications with or without a diuretic.  He states Dr. Burt Knack is his primary cardiologist but Dr. Caryl Comes made these changes because he was here for his appointment.  He states he would be appreciative of advice from either Dr. Burt Knack or Dr. Caryl Comes.  He would like an appointment with Dr. Caryl Comes regarding pacemaker right side lead malfunction.  I advised I will send a message to Dr. Olin Pia scheduler.  I advised I will call back with advice regarding BP medication.  He verbalized understanding and agreement.

## 2016-01-30 NOTE — Telephone Encounter (Signed)
New Message:   Pt have been on Amlodipine for 1 week,there is no improvement with his blood pressure. Please call,would like to discuss changing to something else.

## 2016-01-31 ENCOUNTER — Encounter: Payer: Self-pay | Admitting: Internal Medicine

## 2016-01-31 ENCOUNTER — Ambulatory Visit (INDEPENDENT_AMBULATORY_CARE_PROVIDER_SITE_OTHER): Payer: Medicare Other | Admitting: Internal Medicine

## 2016-01-31 ENCOUNTER — Other Ambulatory Visit (INDEPENDENT_AMBULATORY_CARE_PROVIDER_SITE_OTHER): Payer: Medicare Other | Admitting: *Deleted

## 2016-01-31 VITALS — BP 136/90 | HR 79 | Ht 72.0 in | Wt 199.8 lb

## 2016-01-31 DIAGNOSIS — I4892 Unspecified atrial flutter: Secondary | ICD-10-CM

## 2016-01-31 DIAGNOSIS — I10 Essential (primary) hypertension: Secondary | ICD-10-CM | POA: Diagnosis not present

## 2016-01-31 DIAGNOSIS — Z45018 Encounter for adjustment and management of other part of cardiac pacemaker: Secondary | ICD-10-CM

## 2016-01-31 DIAGNOSIS — I495 Sick sinus syndrome: Secondary | ICD-10-CM | POA: Diagnosis not present

## 2016-01-31 LAB — BASIC METABOLIC PANEL
BUN: 12 mg/dL (ref 7–25)
CALCIUM: 8.8 mg/dL (ref 8.6–10.3)
CHLORIDE: 102 mmol/L (ref 98–110)
CO2: 26 mmol/L (ref 20–31)
Creat: 1.03 mg/dL (ref 0.70–1.18)
Glucose, Bld: 72 mg/dL (ref 65–99)
Potassium: 4.4 mmol/L (ref 3.5–5.3)
Sodium: 136 mmol/L (ref 135–146)

## 2016-01-31 NOTE — Telephone Encounter (Signed)
Pt called back with lab work results from PCP. Pt stated that labs last collected in May 2016. Bun 13, Crea. 1.09; GFR 67; and K 5.2;  Results reviewed with Dr. Caryl Comes. Request that pt have repeat lab work and medication adjustments will be made accordingly.  Pt contacted, lab work ordered, and appt set for this afternoon.  Pt verbalized understanding, no additional questions at this time.

## 2016-01-31 NOTE — Progress Notes (Signed)
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 He is s/p PVI X 2 at [facility]  He has had some variability in functional status, but at last visit were not able to demonstrate correlation between arrhythmia and symptoms     He has a history of coronary artery disease with prior stenting; per TDS  it was elected to use aspirin and a NOAC and Rivaroxaban was started   he had an episode of arm pain the other day during an episode of anger. His functional status is noted has gradually declined over recent months; this is confirmed by looking at his paHcemaker activity sensor. There has been no shortness of breath or edema.   We have struggled with BP and orhtostasis and we tried to resume amlodipine, although he has been noted to have mild LV dysfunction and we had discussed ARB;  Dr [facility] has ordered renal function to initiate  We had reprogrammed atrial rate is 70; was associated with some decrease in atrial fibrillation burden.   He comes in today with questions regarding the consequences of RV pacing and the potential benefits of resynchronization        Past Medical History  Diagnosis Date  . Hyperlipidemia   . Pulsatile tinnitus   . 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  . Familial tremor   . Hypertension   . Benign paroxysmal vertigo   . Heart murmur   . Atrial flutter -atypical 2007    ABLATION x2 [facility]  . Atrial tachycardia ([place]) 2007    ABLATION [facility]  . First degree AV block   . Tachy-brady syndrome ([place])     a. developed after RFCA @ [facility] 2013-> MDT PPM;  b. s/p RA lead revision 05/2012  . History of blood transfusion 2010    "2nd day after prostatectomy"  . Migraines   . Prostate cancer ([place])   . Carcinoma ([place])     "off my chest"  . CAD (coronary artery disease)     a. abnl  mv 01/2012;  b. cath 05/23/2012 LCX 80@ bifurcation w/ OM, 95% @ AV groove, otw nonobs dzs, EF 55-65%;    Past Surgical History  Procedure Laterality Date  . Prostatectomy  [id number]    COMPLICATED  . Shoulder surgery      Bilateral; "boney spurs removed"  . Atrial ablation surgery  04/01/12    "4th time I had one"  . Colonoscopy    . Cardioversion  12/03/2011    Procedure: CARDIOVERSION;  Surgeon: [name], MD;  Location: [place];  Service: Cardiovascular;  Laterality: [name];  . Cardioversion  01/13/2012    Procedure: CARDIOVERSION;  Surgeon: Lelon Perla, MD;  Location: [place];  Service: Cardiovascular;  Laterality: [name];  . Cardioversion  04/27/2012    Procedure: CARDIOVERSION;  Surgeon: Darlin Coco, MD;  Location: [place];  Service: Cardiovascular;  Laterality: [name];  . Insert / replace / remove pacemaker  03/2012    initial placement  . Insert / replace / remove pacemaker  05/25/12    "atrial lead change"  . Inguinal hernia repair  1945    left side  . Hernia repair  1980's  . Tonsillectomy and adenoidectomy      "as a child"  . Skin  cancer excision      "in situ; right chest"  . Cardiac catheterization    . Coronary angioplasty with stent placement  06/02/12    "1; first one ever"  . Lead revision [name] 05/25/2012    Procedure: LEAD REVISION;  Surgeon: Evans Lance, MD;  Location: [facility] CATH LAB;  Service: Cardiovascular;  Laterality: [name];  . Percutaneous coronary stent intervention (pci-s) [name] 06/02/2012    Procedure: PERCUTANEOUS CORONARY STENT INTERVENTION (PCI-S);  Surgeon: Hillary Bow, MD;  Location[facility] CATH LAB;  Service: Cardiovascular;  Laterality: [name];    Current Outpatient Prescriptions  Medication Sig Dispense Refill  . acyclovir (ZOVIRAX) 200 MG capsule Take 200 mg by mouth 2 (two) times daily as needed. For prophylaxis    . amLODipine (NORVASC) 5 MG tablet Take 2.5 mg by mouth daily.  11  . aspirin EC 81 MG tablet Take 81 mg by mouth daily.    .  cholecalciferol (VITAMIN D) 1000 UNITS tablet Take 1,000 Units by mouth daily.    . nitroGLYCERIN (NITROSTAT) 0.4 MG SL tablet Place 0.4 mg under the tongue every 5 (five) minutes as needed for chest pain (x 3 tabs).    . propranolol (INDERAL) 10 MG tablet Take 10 mg by mouth 3 (three) times daily as needed (arrhythmias, tremor, migraines.Marland Kitchen).    Marland Kitchen rivaroxaban (XARELTO) 20 MG TABS tablet TAKE 1 TABLET (20 MG TOTAL) BY MOUTH DAILY WITH SUPPER. 90 tablet 3  . vitamin B-12 (CYANOCOBALAMIN) 1000 MCG tablet Take 1,000 mcg by mouth daily.     No current facility-administered medications for this visit.    Allergies  Allergen Reactions  . Ceclor [Cefaclor] Hives  . Chlorhexidine Itching and Rash  . Statins Other (See Comments)    Muscle weakness  . Ace Inhibitors Other (See Comments)    Does not take due to "angiotensin is responsible for maintaining bladder sphincter tone"    Review of Systems negative except from HPI and PMH  Physical Exam  BP 136/90 mmHg  Pulse 79  Ht 6' (1.829 m)  Wt 199 lb 12.8 oz (90.629 kg)  BMI 27.09 kg/m2  Well developed and well nourished in no acute distress HENT normal E scleral and icterus clear Neck Supple Device pocket well healed; without hematoma or erythema.  There is no tethering  JVP flat; carotids brisk and full Clear to ausculation  Regular rate and rhythm, 2/6 early systolic murmur Soft with active bowel sounds No clubbing cyanosis  Edema Alert and oriented, grossly normal motor and sensory function Skin Warm and Dry  ECG demonstrates AV pacing   Assessment and  Plan  Atrial flutter   Atrial fibrillation s/p ablation [facility] x2  Sinus node dysfunction  Orthostatic lightheadedness improved  Pacemaker Medtronic  The patient's device was interrogated.  The information was reviewed. No changes were made in the programming.    Coronary artery disease s/p  DES stenting 2013  He currently is in sinus rhythm.  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 Plan echocardiogram in about 2 months following initiation of an ARB. We anticipate starting ARB this week once we have laboratory work back in Dr. Laurann Montana. This will be used as an alternative to his amlodipine for blood pressure management.  He has brought research with him that outlines concerns with a rbc's and sphincter control.  We spent more than 50% of our >25 min visit in  face to face counseling regarding the above  We have obttained labs from [facility]  5/16  K 5.2  Will repeat

## 2016-01-31 NOTE — Patient Instructions (Addendum)
Medication Instructions:  Your physician recommends that you continue on your current medications as directed. Please refer to the Current Medication list given to you today.  Labwork: None ordered  Testing/Procedures: Your physician has requested that you have an echocardiogram in 2 months. Echocardiography is a painless test that uses sound waves to create images of your heart. It provides your doctor with information about the size and shape of your heart and how well your heart's chambers and valves are working. This procedure takes approximately one hour. There are no restrictions for this procedure.  Follow-Up: Remote monitoring is used to monitor your Pacemaker of ICD from home. This monitoring reduces the number of office visits required to check your device to one time per year. It allows [place] to keep an eye on the functioning of your device to ensure it is working properly. You are scheduled for a device check from home on 05/01/2016. You may send your transmission at any time that day. If you have a wireless device, the transmission will be sent automatically. After your physician reviews your transmission, you will receive a postcard with your next transmission date.  Your physician wants you to follow-up in: July with Dr. Caryl Comes.  You will receive a reminder letter in the mail two months in advance. If you don't receive a letter, please call our office to schedule the follow-up appointment.  If you need a refill on your cardiac medications before your next appointment, please call your pharmacy.  Thank you for choosing [facility]!!

## 2016-01-31 NOTE — Telephone Encounter (Signed)
Last labs on file here are from 2015 and renal fxn was normal. Please obtain updated labs from PCP and if renal fxn/potassium are ok, would start diovan 40 mg daily. thx

## 2016-01-31 NOTE — Telephone Encounter (Signed)
Follow Up:  His last lab work was on 04-02-2015,he wants you to call so he can you his lab results,

## 2016-02-04 ENCOUNTER — Other Ambulatory Visit: Payer: Self-pay | Admitting: *Deleted

## 2016-02-04 MED ORDER — LOSARTAN POTASSIUM 25 MG PO TABS: 25.0000 mg | ORAL_TABLET | Freq: Every day | ORAL | Status: DC

## 2016-02-05 LAB — CUP PACEART INCLINIC DEVICE CHECK
Brady Statistic AP VS Percent: 0.11 %
Brady Statistic AS VP Percent: 59.65 %
Brady Statistic AS VS Percent: 4.24 %
Date Time Interrogation Session: 20170317125819
Implantable Lead Implant Date: 20130710
Implantable Lead Location: 753860
Implantable Lead Model: 5086
Lead Channel Impedance Value: 361 Ohm
Lead Channel Impedance Value: 456 Ohm
Lead Channel Pacing Threshold Pulse Width: 0.4 ms
Lead Channel Sensing Intrinsic Amplitude: 11.6 mV
Lead Channel Sensing Intrinsic Amplitude: 3.1 mV
Lead Channel Setting Pacing Amplitude: 2 V
Lead Channel Setting Pacing Amplitude: 2.5 V
Lead Channel Setting Sensing Sensitivity: 0.9 mV
MDC IDC LEAD IMPLANT DT: 20130517
MDC IDC LEAD LOCATION: 753859
MDC IDC LEAD MODEL: 5086
MDC IDC MSMT BATTERY REMAINING LONGEVITY: 48 mo
MDC IDC MSMT BATTERY VOLTAGE: 2.99 V
MDC IDC MSMT LEADCHNL RA IMPEDANCE VALUE: 380 Ohm
MDC IDC MSMT LEADCHNL RA IMPEDANCE VALUE: 475 Ohm
MDC IDC MSMT LEADCHNL RA PACING THRESHOLD AMPLITUDE: 1 V
MDC IDC MSMT LEADCHNL RA PACING THRESHOLD PULSEWIDTH: 0.4 ms
MDC IDC MSMT LEADCHNL RV PACING THRESHOLD AMPLITUDE: 0.75 V
MDC IDC SET LEADCHNL RV PACING PULSEWIDTH: 0.4 ms
MDC IDC STAT BRADY AP VP PERCENT: 36 %
MDC IDC STAT BRADY RA PERCENT PACED: 36.12 %
MDC IDC STAT BRADY RV PERCENT PACED: 95.65 %

## 2016-03-02 ENCOUNTER — Encounter: Payer: Self-pay | Admitting: Internal Medicine

## 2016-04-01 ENCOUNTER — Ambulatory Visit (HOSPITAL_COMMUNITY): Payer: Medicare Other | Attending: Cardiology

## 2016-04-01 ENCOUNTER — Other Ambulatory Visit: Payer: Self-pay

## 2016-04-01 DIAGNOSIS — I77810 Thoracic aortic ectasia: Secondary | ICD-10-CM | POA: Insufficient documentation

## 2016-04-01 DIAGNOSIS — I340 Nonrheumatic mitral (valve) insufficiency: Secondary | ICD-10-CM | POA: Diagnosis not present

## 2016-04-01 DIAGNOSIS — I10 Essential (primary) hypertension: Secondary | ICD-10-CM | POA: Diagnosis not present

## 2016-04-01 DIAGNOSIS — I2510 Atherosclerotic heart disease of native coronary artery without angina pectoris: Secondary | ICD-10-CM | POA: Insufficient documentation

## 2016-04-01 DIAGNOSIS — E785 Hyperlipidemia, unspecified: Secondary | ICD-10-CM | POA: Diagnosis not present

## 2016-04-01 DIAGNOSIS — I4892 Unspecified atrial flutter: Secondary | ICD-10-CM | POA: Diagnosis not present

## 2016-04-01 DIAGNOSIS — I495 Sick sinus syndrome: Secondary | ICD-10-CM | POA: Insufficient documentation

## 2016-05-04 ENCOUNTER — Encounter: Payer: Self-pay | Admitting: Gastroenterology

## 2016-05-11 DIAGNOSIS — C61 Malignant neoplasm of prostate: Secondary | ICD-10-CM | POA: Insufficient documentation

## 2016-06-02 ENCOUNTER — Encounter: Payer: Medicare Other | Admitting: Internal Medicine

## 2016-06-10 ENCOUNTER — Encounter: Payer: Self-pay | Admitting: Gastroenterology

## 2016-06-15 ENCOUNTER — Ambulatory Visit (INDEPENDENT_AMBULATORY_CARE_PROVIDER_SITE_OTHER): Payer: Medicare Other | Admitting: Internal Medicine

## 2016-06-15 ENCOUNTER — Encounter: Payer: Self-pay | Admitting: Internal Medicine

## 2016-06-15 ENCOUNTER — Encounter: Payer: Self-pay | Admitting: *Deleted

## 2016-06-15 VITALS — BP 116/84 | HR 72 | Ht 72.0 in | Wt 198.4 lb

## 2016-06-15 DIAGNOSIS — I4891 Unspecified atrial fibrillation: Secondary | ICD-10-CM

## 2016-06-15 DIAGNOSIS — I495 Sick sinus syndrome: Secondary | ICD-10-CM

## 2016-06-15 DIAGNOSIS — I4892 Unspecified atrial flutter: Secondary | ICD-10-CM

## 2016-06-15 DIAGNOSIS — I951 Orthostatic hypotension: Secondary | ICD-10-CM

## 2016-06-15 DIAGNOSIS — Z01812 Encounter for preprocedural laboratory examination: Secondary | ICD-10-CM

## 2016-06-15 DIAGNOSIS — Z950 Presence of cardiac pacemaker: Secondary | ICD-10-CM

## 2016-06-15 LAB — CBC WITH DIFFERENTIAL/PLATELET
BASOS ABS: 62 {cells}/uL (ref 0–200)
Basophils Relative: 1 %
Eosinophils Absolute: 248 cells/uL (ref 15–500)
Eosinophils Relative: 4 %
HEMATOCRIT: 41.1 % (ref 38.5–50.0)
HEMOGLOBIN: 14.4 g/dL (ref 13.2–17.1)
LYMPHS ABS: 1302 {cells}/uL (ref 850–3900)
Lymphocytes Relative: 21 %
MCH: 30.6 pg (ref 27.0–33.0)
MCHC: 35 g/dL (ref 32.0–36.0)
MCV: 87.4 fL (ref 80.0–100.0)
MONO ABS: 682 {cells}/uL (ref 200–950)
MPV: 10.8 fL (ref 7.5–12.5)
Monocytes Relative: 11 %
NEUTROS ABS: 3906 {cells}/uL (ref 1500–7800)
Neutrophils Relative %: 63 %
Platelets: 193 10*3/uL (ref 140–400)
RBC: 4.7 MIL/uL (ref 4.20–5.80)
RDW: 13.8 % (ref 11.0–15.0)
WBC: 6.2 10*3/uL (ref 3.8–10.8)

## 2016-06-15 LAB — BASIC METABOLIC PANEL
BUN: 13 mg/dL (ref 7–25)
CHLORIDE: 100 mmol/L (ref 98–110)
CO2: 27 mmol/L (ref 20–31)
Calcium: 9.1 mg/dL (ref 8.6–10.3)
Creat: 1.06 mg/dL (ref 0.70–1.18)
GLUCOSE: 94 mg/dL (ref 65–99)
POTASSIUM: 4.7 mmol/L (ref 3.5–5.3)
SODIUM: 133 mmol/L — AB (ref 135–146)

## 2016-06-15 NOTE — Progress Notes (Signed)
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 He is s/p PVI [facility] 2 at [facility]  He has had some variability in functional status, but at last visit were not able to demonstrate correlation between arrhythmia and symptoms     He has a history of coronary artery disease with prior stenting; per TDS  It was elected to use long term aspirin and a NOAC and Rivaroxaban was started   His functional status is noted has gradually declined over recent months; this is confirmed by looking at his pacemaker activity sensor. There has been mild  shortness of breath and fatigue although no significant edema.  Blood pressures are mildly elevated in the afternoon in the 140/90 range. Morning blood pressures are good. He is no longer taking amlodipine. He is on losartan. Blood work was checked at [facility] with a potassium of 5.4 and a sodium of 133; he reminds me of a history of  possible RMSF associated with low sodiums of few years ago      > DATE TEST    1/14    ECHO   EF 55-60 %   1/17    MYOVIEW   EF 47 % NO ischemia  5/17 echo EF 40-45%       We had reprogrammed atrial rate is 70; was associated with some decrease in atrial fibrillation burden.   Largely relegated to Atrial arrhythmia  90% since 3/17           Past Medical History:  Diagnosis Date  . Atrial flutter -atypical 2007   ABLATION x2 [facility]  . Atrial tachycardia ([place]) 2007   ABLATION [facility]  . Benign paroxysmal vertigo   . CAD (coronary artery disease)    a. abnl mv 01/2012;  b. cath 05/23/2012 LCX 80@ bifurcation w/ OM, 95% @ AV groove, otw nonobs dzs, EF 55-65%;  . Carcinoma ([place])    "off my chest"  . Familial tremor   . First degree AV block   . Heart murmur   . History of blood transfusion 2010   "2nd day after prostatectomy"  . Hyperlipidemia   .  Hypertension   . Incontinence    DECREASING INCONTINENCE RELATED TO PROSTATECTOMY.  . Migraines   . Prostate cancer ([place])   . Pulsatile tinnitus   . Tachy-brady syndrome ([place])    a. developed after RFCA @ [facility] 2013-> MDT PPM;  b. s/p RA lead revision 05/2012    Past Surgical History:  Procedure Laterality Date  . ATRIAL ABLATION SURGERY  04/01/12   "4th time I had one"  . CARDIAC CATHETERIZATION    . CARDIOVERSION  12/03/2011   Procedure: CARDIOVERSION;  Surgeon: [name], MD;  Location: [place];  Service: Cardiovascular;  Laterality: [name];  . CARDIOVERSION  01/13/2012   Procedure: CARDIOVERSION;  Surgeon: Lelon Perla, MD;  Location: [place];  Service: Cardiovascular;  Laterality: [name];  . CARDIOVERSION  04/27/2012   Procedure: CARDIOVERSION;  Surgeon: Darlin Coco, MD;  Location: [place];  Service: Cardiovascular;  Laterality: [name];  . COLONOSCOPY    . CORONARY ANGIOPLASTY WITH STENT PLACEMENT  06/02/12   "1; first one ever"  . HERNIA REPAIR  1980's  . INGUINAL HERNIA REPAIR  1945   left side  . INSERT / REPLACE /  REMOVE PACEMAKER  03/2012   initial placement  . INSERT / REPLACE / REMOVE PACEMAKER  05/25/12   "atrial lead change"  . LEAD REVISION [name] 05/25/2012   Procedure: LEAD REVISION;  Surgeon: Evans Lance, MD;  Location: [facility] CATH LAB;  Service: Cardiovascular;  Laterality: [name];  . PERCUTANEOUS CORONARY STENT INTERVENTION (PCI-S) [name] 06/02/2012   Procedure: PERCUTANEOUS CORONARY STENT INTERVENTION (PCI-S);  Surgeon: Hillary Bow, MD;  Location: [facility] CATH LAB;  Service: Cardiovascular;  Laterality: [name];  . PROSTATECTOMY  [id number]   COMPLICATED  . SHOULDER SURGERY     Bilateral; "boney spurs removed"  . SKIN CANCER EXCISION     "in situ; right chest"  . TONSILLECTOMY AND ADENOIDECTOMY     "as a child"    Current Outpatient Prescriptions  Medication Sig Dispense Refill  . acyclovir (ZOVIRAX) 200 MG capsule Take 200 mg by mouth 2 (two) times daily as needed. For  prophylaxis    . acyclovir (ZOVIRAX) 400 MG tablet Take 400 mg by mouth as needed. Herpes flare on legs    . aspirin EC 81 MG tablet Take 81 mg by mouth daily.    . cholecalciferol (VITAMIN D) 1000 UNITS tablet Take 1,000 Units by mouth daily.    Marland Kitchen doxycycline (VIBRAMYCIN) 100 MG capsule Take 1 capsule by mouth as needed. rosacea flare    . losartan (COZAAR) 25 MG tablet Take 1 tablet (25 mg total) by mouth daily. 30 tablet 6  . losartan (COZAAR) 25 MG tablet Take 25 mg by mouth daily.    . nitroGLYCERIN (NITROSTAT) 0.4 MG SL tablet Place 0.4 mg under the tongue every 5 (five) minutes as needed for chest pain (x 3 tabs).    . propranolol (INDERAL) 10 MG tablet Take 10 mg by mouth 3 (three) times daily as needed (arrhythmias, tremor, migraines.Marland Kitchen).    Marland Kitchen rivaroxaban (XARELTO) 20 MG TABS tablet TAKE 1 TABLET (20 MG TOTAL) BY MOUTH DAILY WITH SUPPER. 90 tablet 3  . vitamin B-12 (CYANOCOBALAMIN) 1000 MCG tablet Take 1,000 mcg by mouth daily.     No current facility-administered medications for this visit.     Allergies  Allergen Reactions  . Ceclor [Cefaclor] Hives  . Chlorhexidine Itching and Rash  . Statins Other (See Comments)    Muscle weakness  . Ace Inhibitors Other (See Comments)    Does not take due to "angiotensin is responsible for maintaining bladder sphincter tone"    Review of Systems negative except from HPI and PMH  Physical Exam  BP 116/84   Pulse 72   Ht 6' (1.829 m)   Wt 198 lb 6.4 oz (90 kg)   SpO2 98%   BMI 26.91 kg/m   Well developed and well nourished in no acute distress HENT normal E scleral and icterus clear Neck Supple Device pocket well healed; without hematoma or erythema.  There is no tethering  JVP flat; carotids brisk and full Clear to ausculation  Regular rate and rhythm, 2/6 early systolic murmur Soft with active bowel sounds No clubbing cyanosis  Edema Alert and oriented, grossly normal motor and sensory function Skin Warm and Dry  ECG  demonstratesAtrial flutter with underlying ventricular pacing  Assessment and  Plan  Atrial flutter   Atrial fibrillation s/p ablation [facility] x2  Sinus node dysfunction  Orthostatic lightheadedness improved  Pacemaker Medtronic  The patient's device was interrogated.  The information was reviewed. No changes were made in the programming.    Coronary artery disease s/p  DES stenting 2013>>long term dual therapy recommended per Dr Lilly Cove    Ongoing issues of LV dysfunction and exercise intolerance, We discussed CRT upgrade.  Given left ventricular dysfunction and exercise intolerance, we have discussed CRT upgrade. He has ended up with an extraction of a dislodged atrial lead; not withstanding are no superficial collaterals making the somewhat sanguine that the left subclavian vein will be patent. We will undertake a venogram. We may need to go from the right side and tunnel . Given the third procedure on the left side we will use a antimicrobial pouch.  He has an elevated potassium again.  We will have to keep a close eye on this given the introduction of his ARB.  He is on low-dose propranolol. He has not tolerated other beta blockers easily; there is a role for carvedilol and/or metoprolol and/or bisoprolol given left ventricular dysfunction.

## 2016-06-15 NOTE — Patient Instructions (Signed)
Medication Instructions: - Your physician recommends that you continue on your current medications as directed. Please refer to the Current Medication list given to you today.  Labwork: - Your physician recommends that you have lab work today: BMP/CBC  Procedures/Testing: - Your physician has recommended that you have a CRT/ Bi-V pacemaker upgrade [employer] when you decide on the date**   Follow-Up: - pending procedure date  Any Additional Special Instructions Will Be Listed Below (If Applicable).     If you need a refill on your cardiac medications before your next appointment, please call your pharmacy.

## 2016-06-16 ENCOUNTER — Encounter: Payer: Self-pay | Admitting: Internal Medicine

## 2016-06-17 ENCOUNTER — Ambulatory Visit (HOSPITAL_COMMUNITY)
Admission: RE | Admit: 2016-06-17 | Discharge: 2016-06-18 | Disposition: A | Discharge status: Home / Self Care | Payer: Medicare Other | Source: Ambulatory Visit | Attending: Internal Medicine | Admitting: Internal Medicine

## 2016-06-17 ENCOUNTER — Encounter (HOSPITAL_COMMUNITY): Payer: Self-pay | Admitting: Internal Medicine

## 2016-06-17 ENCOUNTER — Encounter (HOSPITAL_COMMUNITY)
Admission: RE | Disposition: A | Discharge status: Home / Self Care | Payer: Self-pay | Source: Ambulatory Visit | Attending: Internal Medicine

## 2016-06-17 DIAGNOSIS — E785 Hyperlipidemia, unspecified: Secondary | ICD-10-CM | POA: Diagnosis not present

## 2016-06-17 DIAGNOSIS — R42 Dizziness and giddiness: Secondary | ICD-10-CM | POA: Diagnosis not present

## 2016-06-17 DIAGNOSIS — I2510 Atherosclerotic heart disease of native coronary artery without angina pectoris: Secondary | ICD-10-CM | POA: Diagnosis not present

## 2016-06-17 DIAGNOSIS — I442 Atrioventricular block, complete: Secondary | ICD-10-CM | POA: Diagnosis present

## 2016-06-17 DIAGNOSIS — Z959 Presence of cardiac and vascular implant and graft, unspecified: Secondary | ICD-10-CM

## 2016-06-17 DIAGNOSIS — I509 Heart failure, unspecified: Secondary | ICD-10-CM | POA: Diagnosis not present

## 2016-06-17 DIAGNOSIS — Z7982 Long term (current) use of aspirin: Secondary | ICD-10-CM | POA: Insufficient documentation

## 2016-06-17 DIAGNOSIS — I10 Essential (primary) hypertension: Secondary | ICD-10-CM | POA: Insufficient documentation

## 2016-06-17 DIAGNOSIS — Z7901 Long term (current) use of anticoagulants: Secondary | ICD-10-CM | POA: Insufficient documentation

## 2016-06-17 DIAGNOSIS — I4892 Unspecified atrial flutter: Secondary | ICD-10-CM | POA: Insufficient documentation

## 2016-06-17 DIAGNOSIS — Z955 Presence of coronary angioplasty implant and graft: Secondary | ICD-10-CM | POA: Insufficient documentation

## 2016-06-17 DIAGNOSIS — Z8546 Personal history of malignant neoplasm of prostate: Secondary | ICD-10-CM | POA: Diagnosis not present

## 2016-06-17 DIAGNOSIS — Z950 Presence of cardiac pacemaker: Secondary | ICD-10-CM | POA: Diagnosis present

## 2016-06-17 DIAGNOSIS — Z01812 Encounter for preprocedural laboratory examination: Secondary | ICD-10-CM | POA: Insufficient documentation

## 2016-06-17 DIAGNOSIS — I428 Other cardiomyopathies: Secondary | ICD-10-CM

## 2016-06-17 HISTORY — DX: Presence of cardiac pacemaker: Z95.0

## 2016-06-17 HISTORY — PX: EP IMPLANTABLE DEVICE: SHX172B

## 2016-06-17 LAB — SURGICAL PCR SCREEN
MRSA, PCR: NEGATIVE
STAPHYLOCOCCUS AUREUS: NEGATIVE

## 2016-06-17 SURGERY — BIV PACEMAKER INSERTION CRT-P
Anesthesia: LOCAL

## 2016-06-17 MED ORDER — HEPARIN (PORCINE) IN NACL 2-0.9 UNIT/ML-% IJ SOLN
INTRAMUSCULAR | Status: DC | PRN
  Administered 2016-06-17: 13:00:00

## 2016-06-17 MED ORDER — RIVAROXABAN 20 MG PO TABS
20.0000 mg | ORAL_TABLET | Freq: Every day | ORAL | Status: DC
  Administered 2016-06-17: 20 mg via ORAL
  Filled 2016-06-17: qty 1

## 2016-06-17 MED ORDER — IOPAMIDOL (ISOVUE-370) INJECTION 76%
INTRAVENOUS | Status: AC
  Filled 2016-06-17: qty 50

## 2016-06-17 MED ORDER — SODIUM CHLORIDE 0.9 % IR SOLN
80.0000 mg | Status: DC
  Administered 2016-06-17: 80 mg

## 2016-06-17 MED ORDER — VANCOMYCIN HCL IN DEXTROSE 1-5 GM/200ML-% IV SOLN
1000.0000 mg | INTRAVENOUS | Status: DC
  Administered 2016-06-17: 1000 mg via INTRAVENOUS

## 2016-06-17 MED ORDER — MUPIROCIN 2 % EX OINT
1.0000 "application " | TOPICAL_OINTMENT | Freq: Once | CUTANEOUS | Status: DC
  Administered 2016-06-17: 1 via TOPICAL

## 2016-06-17 MED ORDER — VITAMIN B-12 1000 MCG PO TABS
1000.0000 ug | ORAL_TABLET | Freq: Every day | ORAL | Status: DC
  Administered 2016-06-18: 1000 ug via ORAL
  Filled 2016-06-17: qty 1

## 2016-06-17 MED ORDER — MIDAZOLAM HCL 5 MG/5ML IJ SOLN
INTRAMUSCULAR | Status: DC | PRN
  Administered 2016-06-17 (×2): 1 mg via INTRAVENOUS
  Administered 2016-06-17: 2 mg via INTRAVENOUS
  Administered 2016-06-17 (×4): 1 mg via INTRAVENOUS

## 2016-06-17 MED ORDER — LIDOCAINE HCL (PF) 1 % IJ SOLN
INTRAMUSCULAR | Status: AC
  Filled 2016-06-17: qty 60

## 2016-06-17 MED ORDER — FENTANYL CITRATE (PF) 100 MCG/2ML IJ SOLN
INTRAMUSCULAR | Status: AC
  Filled 2016-06-17: qty 2

## 2016-06-17 MED ORDER — SODIUM CHLORIDE 0.9 % IR SOLN
Status: AC
  Filled 2016-06-17: qty 2

## 2016-06-17 MED ORDER — SODIUM CHLORIDE 0.9 % IV SOLN
INTRAVENOUS | Status: DC
  Administered 2016-06-17: 12:00:00 via INTRAVENOUS

## 2016-06-17 MED ORDER — LOSARTAN POTASSIUM 25 MG PO TABS
25.0000 mg | ORAL_TABLET | Freq: Every day | ORAL | Status: DC
  Administered 2016-06-18: 25 mg via ORAL
  Filled 2016-06-17: qty 1

## 2016-06-17 MED ORDER — LIDOCAINE HCL (PF) 1 % IJ SOLN
INTRAMUSCULAR | Status: DC | PRN
  Administered 2016-06-17: 35 mL via INTRADERMAL

## 2016-06-17 MED ORDER — IOPAMIDOL (ISOVUE-370) INJECTION 76%
INTRAVENOUS | Status: DC | PRN
  Administered 2016-06-17: 10 mL
  Administered 2016-06-17: 20 mL via INTRAVENOUS

## 2016-06-17 MED ORDER — PROPRANOLOL HCL 10 MG PO TABS
10.0000 mg | ORAL_TABLET | Freq: Three times a day (TID) | ORAL | Status: DC | PRN
  Filled 2016-06-17: qty 1

## 2016-06-17 MED ORDER — MUPIROCIN 2 % EX OINT
TOPICAL_OINTMENT | CUTANEOUS | Status: AC
  Filled 2016-06-17: qty 22

## 2016-06-17 MED ORDER — ASPIRIN EC 81 MG PO TBEC
81.0000 mg | DELAYED_RELEASE_TABLET | Freq: Every day | ORAL | Status: DC
  Administered 2016-06-18: 81 mg via ORAL
  Filled 2016-06-17: qty 1

## 2016-06-17 MED ORDER — NITROGLYCERIN 0.4 MG SL SUBL: 0.4000 mg | SUBLINGUAL_TABLET | SUBLINGUAL | Status: DC | PRN

## 2016-06-17 MED ORDER — HEPARIN (PORCINE) IN NACL 2-0.9 UNIT/ML-% IJ SOLN
INTRAMUSCULAR | Status: AC
  Filled 2016-06-17: qty 1000

## 2016-06-17 MED ORDER — VITAMIN D 1000 UNITS PO TABS
1000.0000 [IU] | ORAL_TABLET | Freq: Every day | ORAL | Status: DC
  Administered 2016-06-18: 1000 [IU] via ORAL
  Filled 2016-06-17: qty 1

## 2016-06-17 MED ORDER — ONDANSETRON HCL 4 MG/2ML IJ SOLN: 4.0000 mg | Freq: Four times a day (QID) | INTRAMUSCULAR | Status: DC | PRN

## 2016-06-17 MED ORDER — MIDAZOLAM HCL 5 MG/5ML IJ SOLN
INTRAMUSCULAR | Status: AC
  Filled 2016-06-17: qty 5

## 2016-06-17 MED ORDER — ACETAMINOPHEN 325 MG PO TABS: 325.0000 mg | ORAL_TABLET | ORAL | Status: DC | PRN

## 2016-06-17 MED ORDER — FENTANYL CITRATE (PF) 100 MCG/2ML IJ SOLN
INTRAMUSCULAR | Status: DC | PRN
  Administered 2016-06-17 (×6): 25 ug via INTRAVENOUS

## 2016-06-17 MED ORDER — VANCOMYCIN HCL IN DEXTROSE 1-5 GM/200ML-% IV SOLN
1000.0000 mg | Freq: Two times a day (BID) | INTRAVENOUS | Status: AC
  Administered 2016-06-17: 1000 mg via INTRAVENOUS
  Filled 2016-06-17: qty 200

## 2016-06-17 MED ORDER — VANCOMYCIN HCL IN DEXTROSE 1-5 GM/200ML-% IV SOLN
INTRAVENOUS | Status: AC
  Filled 2016-06-17: qty 200

## 2016-06-17 SURGICAL SUPPLY — 18 items
BALLN ATTAIN 80 (BALLOONS) ×2
BALLOON ATTAIN 80 (BALLOONS) IMPLANT
CABLE SURGICAL S-101-97-12 (CABLE) ×1 IMPLANT
CATH CPS DIRECT 135 DS2C020 (CATHETERS) ×1 IMPLANT
DEVICE CRTP PERCEPTA QUAD MRI (Pacemaker) ×1 IMPLANT
HEMOSTAT SURGICEL 2X4 FIBR (HEMOSTASIS) ×1 IMPLANT
KIT ESSENTIALS PG (KITS) ×1 IMPLANT
LEAD QUARTET 1458Q-86CM (Lead) ×1 IMPLANT
PAD DEFIB LIFELINK (PAD) ×1 IMPLANT
POUCH AIGIS-R ANTIBACT ICD (Mesh General) ×2 IMPLANT
POUCH AIGIS-R ANTIBACT ICD LRG (Mesh General) IMPLANT
SHEATH CLASSIC 9.5F (SHEATH) ×1 IMPLANT
SHEATH CLASSIC 9F (SHEATH) IMPLANT
SHIELD RADPAD SCOOP 12X17 (MISCELLANEOUS) ×1 IMPLANT
SLITTER UNIVERSAL DS2A003 (MISCELLANEOUS) ×1 IMPLANT
TRAY PACEMAKER INSERTION (PACKS) ×1 IMPLANT
WIRE ACUITY WHISPER EDS 4648 (WIRE) ×1 IMPLANT
WIRE HI TORQ VERSACORE-J 145CM (WIRE) ×1 IMPLANT

## 2016-06-17 NOTE — Interval H&P Note (Signed)
History and Physical Interval Note:  06/17/2016 11:48 AM  [name], MD  has presented today for surgery, with the diagnosis of chf  The various methods of treatment have been discussed with the patient and family. After consideration of risks, benefits and other options for treatment, the patient has consented to  Procedure(s): BiV Pacemaker upgrade ([name]) as a surgical intervention .  The patient's history has been reviewed, patient examined, no change in status, stable for surgery.  I have reviewed the patient's chart and labs.  Questions were answered to the patient's satisfaction.     [name]

## 2016-06-17 NOTE — H&P (View-Only) (Signed)
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 He is s/p PVI X 2 at [facility]  He has had some variability in functional status, but at last visit were not able to demonstrate correlation between arrhythmia and symptoms     He has a history of coronary artery disease with prior stenting; per TDS  It was elected to use long term aspirin and a NOAC and Rivaroxaban was started   His functional status is noted has gradually declined over recent months; this is confirmed by looking at his pacemaker activity sensor. There has been mild  shortness of breath and fatigue although no significant edema.  Blood pressures are mildly elevated in the afternoon in the 140/90 range. Morning blood pressures are good. He is no longer taking amlodipine. He is on losartan. Blood work was checked at [facility] with a potassium of 5.4 and a sodium of 133; he reminds me of a history of  possible RMSF associated with low sodiums of few years ago      > DATE TEST    1/14    ECHO   EF 55-60 %   1/17    MYOVIEW   EF 47 % NO ischemia  5/17 echo EF 40-45%       We had reprogrammed atrial rate is 70; was associated with some decrease in atrial fibrillation burden.   Largely relegated to Atrial arrhythmia  90% since 3/17           Past Medical History:  Diagnosis Date  . Atrial flutter -atypical 2007   ABLATION x2 [facility]  . Atrial tachycardia ([place]) 2007   ABLATION [facility]  . Benign paroxysmal vertigo   . CAD (coronary artery disease)    a. abnl mv 01/2012;  b. cath 05/23/2012 LCX 80@ bifurcation w/ OM, 95% @ AV groove, otw nonobs dzs, EF 55-65%;  . Carcinoma ([place])    "off my chest"  . Familial tremor   . First degree AV block   . Heart murmur   . History of blood transfusion 2010   "2nd day after prostatectomy"  . Hyperlipidemia   .  Hypertension   . Incontinence    DECREASING INCONTINENCE RELATED TO PROSTATECTOMY.  . Migraines   . Prostate cancer ([place])   . Pulsatile tinnitus   . Tachy-brady syndrome ([place])    a. developed after RFCA @ [facility] 2013-> MDT PPM;  b. s/p RA lead revision 05/2012    Past Surgical History:  Procedure Laterality Date  . ATRIAL ABLATION SURGERY  04/01/12   "4th time I had one"  . CARDIAC CATHETERIZATION    . CARDIOVERSION  12/03/2011   Procedure: CARDIOVERSION;  Surgeon: [name], MD;  Location: [place];  Service: Cardiovascular;  Laterality: [name];  . CARDIOVERSION  01/13/2012   Procedure: CARDIOVERSION;  Surgeon: Lelon Perla, MD;  Location: [place];  Service: Cardiovascular;  Laterality: [name];  . CARDIOVERSION  04/27/2012   Procedure: CARDIOVERSION;  Surgeon: Darlin Coco, MD;  Location: [facility];  Service: Cardiovascular;  Laterality: [name];  . COLONOSCOPY    . CORONARY ANGIOPLASTY WITH STENT PLACEMENT  06/02/12   "1; first one ever"  . HERNIA REPAIR  1980's  . INGUINAL HERNIA REPAIR  1945   left side  . INSERT / REPLACE /  REMOVE PACEMAKER  03/2012   initial placement  . INSERT / REPLACE / REMOVE PACEMAKER  05/25/12   "atrial lead change"  . LEAD REVISION [name] 05/25/2012   Procedure: LEAD REVISION;  Surgeon: Evans Lance, MD;  Location: [facility] CATH LAB;  Service: Cardiovascular;  Laterality: [name];  . PERCUTANEOUS CORONARY STENT INTERVENTION (PCI-S) [name] 06/02/2012   Procedure: PERCUTANEOUS CORONARY STENT INTERVENTION (PCI-S);  Surgeon: Hillary Bow, MD;  Location: [facility] CATH LAB;  Service: Cardiovascular;  Laterality: [name];  . PROSTATECTOMY  [id number]   COMPLICATED  . SHOULDER SURGERY     Bilateral; "boney spurs removed"  . SKIN CANCER EXCISION     "in situ; right chest"  . TONSILLECTOMY AND ADENOIDECTOMY     "as a child"    Current Outpatient Prescriptions  Medication Sig Dispense Refill  . acyclovir (ZOVIRAX) 200 MG capsule Take 200 mg by mouth 2 (two) times daily as needed. For  prophylaxis    . acyclovir (ZOVIRAX) 400 MG tablet Take 400 mg by mouth as needed. Herpes flare on legs    . aspirin EC 81 MG tablet Take 81 mg by mouth daily.    . cholecalciferol (VITAMIN D) 1000 UNITS tablet Take 1,000 Units by mouth daily.    Marland Kitchen doxycycline (VIBRAMYCIN) 100 MG capsule Take 1 capsule by mouth as needed. rosacea flare    . losartan (COZAAR) 25 MG tablet Take 1 tablet (25 mg total) by mouth daily. 30 tablet 6  . losartan (COZAAR) 25 MG tablet Take 25 mg by mouth daily.    . nitroGLYCERIN (NITROSTAT) 0.4 MG SL tablet Place 0.4 mg under the tongue every 5 (five) minutes as needed for chest pain (x 3 tabs).    . propranolol (INDERAL) 10 MG tablet Take 10 mg by mouth 3 (three) times daily as needed (arrhythmias, tremor, migraines.Marland Kitchen).    Marland Kitchen rivaroxaban (XARELTO) 20 MG TABS tablet TAKE 1 TABLET (20 MG TOTAL) BY MOUTH DAILY WITH SUPPER. 90 tablet 3  . vitamin B-12 (CYANOCOBALAMIN) 1000 MCG tablet Take 1,000 mcg by mouth daily.     No current facility-administered medications for this visit.     Allergies  Allergen Reactions  . Ceclor [Cefaclor] Hives  . Chlorhexidine Itching and Rash  . Statins Other (See Comments)    Muscle weakness  . Ace Inhibitors Other (See Comments)    Does not take due to "angiotensin is responsible for maintaining bladder sphincter tone"    Review of Systems negative except from HPI and PMH  Physical Exam  BP 116/84   Pulse 72   Ht 6' (1.829 m)   Wt 198 lb 6.4 oz (90 kg)   SpO2 98%   BMI 26.91 kg/m   Well developed and well nourished in no acute distress HENT normal E scleral and icterus clear Neck Supple Device pocket well healed; without hematoma or erythema.  There is no tethering  JVP flat; carotids brisk and full Clear to ausculation  Regular rate and rhythm, 2/6 early systolic murmur Soft with active bowel sounds No clubbing cyanosis  Edema Alert and oriented, grossly normal motor and sensory function Skin Warm and Dry  ECG  demonstratesAtrial flutter with underlying ventricular pacing  Assessment and  Plan  Atrial flutter   Atrial fibrillation s/p ablation [facility] x2  Sinus node dysfunction  Orthostatic lightheadedness improved  Pacemaker Med[employer]onic  The patient's device was interrogated.  The information was reviewed. No changes were made in the programming.    Coronary artery disease s/p  DES stenting 2013>>long term dual therapy recommended per Dr Lilly Cove    Ongoing issues of LV dysfunction and exercise intolerance, We discussed CRT upgrade.  Given left ventricular dysfunction and exercise intolerance, we have discussed CRT upgrade. He has ended up with an extraction of a dislodged atrial lead; not withstanding are no superficial collaterals making the somewhat sanguine that the left subclavian vein will be patent. We will undertake a venogram. We may need to go from the right side and tunnel . Given the third procedure on the left side we will use a antimicrobial pouch.  He has an elevated potassium again.  We will have to keep a close eye on this given the introduction of his ARB.  He is on low-dose propranolol. He has not tolerated other beta blockers easily; there is a role for carvedilol and/or metoprolol and/or bisoprolol given left ventricular dysfunction.

## 2016-06-17 NOTE — Op Note (Signed)
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  PROCEDURE:  Contrast venogram, left ventricular lead placement, pocket revision, explantation of a dual-chamber pacemaker, insertion of a resynchronization pacemaker-CRT upgrade.  DESCRIPTION OF PROCEDURE:  Following obtaining informed consent, the patient was brought to the Electrophysiology Laboratory and placed on the fluoroscopic table in a supine position.  After routine prep and drape and a contrast venogram having demonstrated patency of the extrathoracic left subclavian vein, an incision was made medial to the prior incision as there was an anticipation that we would move the device medially from its axillary location.  Venous access was obtained without difficulty.  A guidewire was placed and retained and a 9.5 French sheath was placed through which was passed a St. Jude 135 coronary sinus cannulation catheter.  The coronary sinus was cannulated without difficulty.  Nonocclusive venography demonstrated a large high lateral vein, which was then targeted.  A St. Jude 1458 lead, serial number [id number] was then manipulated to the junction at the mid and distal third.  Because of this, pole 1 was excluded.  In the 2-3 configuration, the L-wave was 4.5 with a pace impedance of 247, threshold 0.6 V at 0.5 milliseconds.  The delivery system was removed, the lead was secured.  At this point, we turned our attention to  gaining access to the device pocket.  The pocket was opened, the device was explanted.  The pocket was expanded medially and inferiorly to allow for housing of the larger pulse generator and inferomedial translocation.  Assessment of the previously implanted lead demonstrated a fibrillation wave of 1.4 AO, impedance of 513.  There was no intrinsic ventricular rhythm.  RV impedance was 475 with a threshold 1 V at 0.4 and the LV impedance was 779 and a 32 configuration with a threshold of 1.5 at 0.4 milliseconds. There was a clear change in the activation sequence with a negative QRS in V1 going upright and a positive QRS in lead 1 becoming negative.  The pocket was copiously irrigated with antibiotic-containing saline solution.  Hemostasis was assured.  Leads and pulse generator were placed in the pocket, secured to the prepectoral fascia.  Surgicel was placed along the cephalad and lateral aspect of the pocket and an antimicrobial Aegis pouch was placed developing the pulse generator.  The wound was then closed in 3 layers in normal fashion.  The wound was washed, dried, and a Dermabond dressing was applied.  Needle counts, sponge counts, and instrument counts were correct at the end of the procedure.  The patient tolerated the procedure without apparent complications.     [name], MD, [facility]     SCK/MEDQ  D:  06/17/2016  T:  06/17/2016  Job:  [id number]

## 2016-06-18 ENCOUNTER — Encounter (HOSPITAL_COMMUNITY): Payer: Self-pay | Admitting: General Practice

## 2016-06-18 ENCOUNTER — Ambulatory Visit (HOSPITAL_COMMUNITY): Payer: Medicare Other

## 2016-06-18 DIAGNOSIS — I429 Cardiomyopathy, unspecified: Secondary | ICD-10-CM | POA: Diagnosis not present

## 2016-06-18 DIAGNOSIS — Z7982 Long term (current) use of aspirin: Secondary | ICD-10-CM | POA: Diagnosis not present

## 2016-06-18 DIAGNOSIS — I2510 Atherosclerotic heart disease of native coronary artery without angina pectoris: Secondary | ICD-10-CM | POA: Diagnosis not present

## 2016-06-18 DIAGNOSIS — I442 Atrioventricular block, complete: Secondary | ICD-10-CM | POA: Diagnosis not present

## 2016-06-18 DIAGNOSIS — Z950 Presence of cardiac pacemaker: Secondary | ICD-10-CM | POA: Diagnosis not present

## 2016-06-18 MED FILL — Sodium Chloride Irrigation Soln 0.9%: Qty: 500 | Status: AC

## 2016-06-18 MED FILL — Gentamicin Sulfate Inj 40 MG/ML: INTRAMUSCULAR | Qty: 2 | Status: AC

## 2016-06-18 MED FILL — Heparin Sodium (Porcine) 2 Unit/ML in Sodium Chloride 0.9%: INTRAMUSCULAR | Qty: 500 | Status: AC

## 2016-06-18 NOTE — Progress Notes (Signed)
Pt has been discharged home with wife. IV removed with no complications. Pt received discharge instructions and all questions were answered. Pt left the unit via wheelchair and was accompanied by this RN. Pt was in no distress at time of discharge.   Grant Fontana BSN, RN

## 2016-06-18 NOTE — Discharge Summary (Signed)
ELECTROPHYSIOLOGY PROCEDURE DISCHARGE SUMMARY    Patient ID: Walter Mt, [name],  MRN: [id number], DOB/AGE: 06-26-1946 70 y.o.  Admit date: 06/17/2016 Discharge date: 06/18/2016  Primary Care Physician: Irven Shelling, [name] Electrophysiologist: Caryl Comes  Primary Discharge Diagnosis:  Complete heart block and LV dysfunction status post CRTP upgrade this admission  Secondary Discharge Diagnosis:  1.  Atrial fibrillation s/p PVI at [facility] 2.  Non obstructive CAD 3.  Hypertension 4.  Hyperlipidemia 5.  Prostate cancer   Allergies  Allergen Reactions  . Ceclor [Cefaclor] Hives  . Chlorhexidine Itching and Rash  . Statins Other (See Comments)    Muscle weakness  . Ace Inhibitors Other (See Comments)    Does not take due to "angiotensin is responsible for maintaining bladder sphincter tone"     Procedures This Admission:  1.  Upgrade of a previously implanted dual chamber PPM to a MDT CRTP on 06/17/16 by Dr Caryl Comes. See op note for details. There were no immediate post procedure complications. 2.  CXR on 06/18/16 demonstrated no pneumothorax status post device implantation.   Brief HPI: Walter Mt, [name] is a 70 y.o. male with complete heart block s/p PPM with LV dysfunction.  Risks, benefits, and alternatives to CRTP upgrade were reviewed with the patient who wished to proceed.   Hospital Course:  The patient was admitted and underwent upgrade of a previously implanted MDT dual chamber PPM to MDT CRTP with details as outlined above.  He  was monitored on telemetry overnight which demonstrated AV pacing.  Left chest was without hematoma or ecchymosis.  The device was interrogated and found to be functioning normally.  CXR was obtained and demonstrated no pneumothorax status post device implantation.  Wound care, arm mobility, and restrictions were reviewed with the patient.  The patient was examined and considered stable for discharge to home.    Physical Exam: Vitals:   06/17/16 1615  06/17/16 1645 06/17/16 2113 06/18/16 0549  BP: 129/79 129/74 127/66 132/78  Pulse: 61 (!) 59 60 60  Resp: 18 18 18 17   Temp:   98 F (36.7 C) 98 F (36.7 C)  TempSrc:   Oral Oral  SpO2:   100% 99%  Weight:      Height:        GEN- The patient is well appearing, alert and oriented x 3 today.   HEENT: normocephalic, atraumatic; sclera clear, conjunctiva pink; hearing intact; oropharynx clear; neck supple  Lungs- Clear to ausculation bilaterally, normal work of breathing.  No wheezes, rales, rhonchi Heart- Regular rate and rhythm  GI- soft, non-tender, non-distended, bowel sounds present  Extremities- no clubbing, cyanosis, or edema; DP/PT/radial pulses 2+ bilaterally MS- no significant deformity or atrophy Skin- warm and dry, no rash or lesion, left chest without hematoma/ecchymosis Psych- euthymic mood, full affect Neuro- strength and sensation are intact   Labs:   Lab Results  Component Value Date   WBC 6.2 06/15/2016   HGB 14.4 06/15/2016   HCT 41.1 06/15/2016   MCV 87.4 06/15/2016   PLT 193 06/15/2016     Recent Labs Lab 06/15/16 1002  NA 133*  K 4.7  CL 100  CO2 27  BUN 13  CREATININE 1.06  CALCIUM 9.1  GLUCOSE 94    Discharge Medications:    Medication List    TAKE these medications   acyclovir 200 MG capsule Commonly known as:  ZOVIRAX Take 200 mg by mouth 2 (two) times daily as needed. For prophylaxis  acyclovir 400 MG tablet Commonly known as:  ZOVIRAX Take 400 mg by mouth as needed. Herpes flare on legs   aspirin EC 81 MG tablet Take 81 mg by mouth daily.   cholecalciferol 1000 units tablet Commonly known as:  VITAMIN D Take 1,000 Units by mouth daily.   doxycycline 100 MG capsule Commonly known as:  VIBRAMYCIN Take 1 capsule by mouth as needed. rosacea flare   losartan 25 MG tablet Commonly known as:  COZAAR Take 1 tablet (25 mg total) by mouth daily.   nitroGLYCERIN 0.4 MG SL tablet Commonly known as:  NITROSTAT Place 0.4 mg  under the tongue every 5 (five) minutes as needed for chest pain (x 3 tabs).   propranolol 10 MG tablet Commonly known as:  INDERAL Take 10 mg by mouth 3 (three) times daily as needed (arrhythmias, tremor, migraines.Marland Kitchen).   rivaroxaban 20 MG Tabs tablet Commonly known as:  XARELTO TAKE 1 TABLET (20 MG TOTAL) BY MOUTH DAILY WITH SUPPER.   vitamin B-12 1000 MCG tablet Commonly known as:  CYANOCOBALAMIN Take 1,000 mcg by mouth daily.       Disposition:  Discharge Instructions    Diet - low sodium heart healthy    Complete by:  As directed   Increase activity slowly    Complete by:  As directed     Follow-up Information    [place] Office Follow up on 06/29/2016.   Specialty:  Cardiology Why:  at 9:30AM for wound check  Contact information: [address] [place] [phone]          Duration of Discharge Encounter: Greater than 30 minutes including physician time.  Signed, Chanetta Marshall, NP 06/18/2016 7:25 AM

## 2016-06-18 NOTE — Discharge Instructions (Signed)
° ° °  Supplemental Discharge Instructions for  Pacemaker/Defibrillator Patients  Activity No heavy lifting or vigorous activity with your left/right arm for 6 to 8 weeks.  Do not raise your left/right arm above your head for one week.  Gradually raise your affected arm as drawn below.           __       06/22/16                         06/23/16                    06/24/16                       06/25/16  NO DRIVING for 1 week    ; you may begin driving on  [id number]   .  WOUND CARE - Keep the wound area clean and dry.   - The tape/steri-strips on your wound will fall off; do not pull them off.  No bandage is needed on the site.  DO  NOT apply any creams, oils, or ointments to the wound area. - If you notice any drainage or discharge from the wound, any swelling or bruising at the site, or you develop a fever > 101? F after you are discharged home, call the office at once.  Special Instructions - You are still able to use cellular telephones; use the ear opposite the side where you have your pacemaker/defibrillator.  Avoid carrying your cellular phone near your device. - When traveling through airports, show security personnel your identification card to avoid being screened in the metal detectors.  Ask the security personnel to use the hand wand. - Avoid arc welding equipment, TENS units (transcutaneous nerve stimulators).  Call the office for questions about other devices. - Avoid electrical appliances that are in poor condition or are not properly grounded. - Microwave ovens are safe to be near or to operate.

## 2016-06-25 ENCOUNTER — Telehealth: Payer: Self-pay | Admitting: *Deleted

## 2016-06-25 NOTE — Telephone Encounter (Signed)
[name] calling about wound check on Monday. He is curious if adjustments need to be made to his device programming would Dr. Caryl Comes be available to ask. I advised him that we could get recommendations from a colleage, contact Dr. Caryl Comes by telephone or he could come back for an additional appointment if later recommendations are made. He verbalizes understanding.

## 2016-06-29 ENCOUNTER — Encounter: Payer: Self-pay | Admitting: Internal Medicine

## 2016-06-29 ENCOUNTER — Ambulatory Visit (INDEPENDENT_AMBULATORY_CARE_PROVIDER_SITE_OTHER): Payer: Medicare Other | Admitting: *Deleted

## 2016-06-29 DIAGNOSIS — Z950 Presence of cardiac pacemaker: Secondary | ICD-10-CM | POA: Diagnosis not present

## 2016-06-29 LAB — CUP PACEART INCLINIC DEVICE CHECK
Date Time Interrogation Session: 20170814172628
Implantable Lead Location: 753859
Implantable Lead Model: 5086
Lead Channel Impedance Value: 494 Ohm
Lead Channel Pacing Threshold Amplitude: 1 V
Lead Channel Pacing Threshold Amplitude: 1.25 V
Lead Channel Pacing Threshold Pulse Width: 0.4 ms
Lead Channel Pacing Threshold Pulse Width: 0.4 ms
Lead Channel Setting Pacing Amplitude: 2.5 V
Lead Channel Setting Pacing Amplitude: 3.5 V
Lead Channel Setting Pacing Pulse Width: 0.4 ms
MDC IDC LEAD IMPLANT DT: 20130517
MDC IDC LEAD IMPLANT DT: 20130710
MDC IDC LEAD IMPLANT DT: 20170802
MDC IDC LEAD LOCATION: 753858
MDC IDC LEAD LOCATION: 753860
MDC IDC LEAD MODEL: 5086
MDC IDC MSMT LEADCHNL LV IMPEDANCE VALUE: 1064 Ohm
MDC IDC MSMT LEADCHNL RA PACING THRESHOLD AMPLITUDE: 1 V
MDC IDC MSMT LEADCHNL RA PACING THRESHOLD PULSEWIDTH: 0.4 ms
MDC IDC MSMT LEADCHNL RA SENSING INTR AMPL: 3 mV
MDC IDC MSMT LEADCHNL RV IMPEDANCE VALUE: 437 Ohm
MDC IDC MSMT LEADCHNL RV SENSING INTR AMPL: 8.8 mV
MDC IDC SET LEADCHNL LV PACING PULSEWIDTH: 0.4 ms
MDC IDC SET LEADCHNL RA PACING AMPLITUDE: 2 V
MDC IDC STAT BRADY RA PERCENT PACED: 41.5 %

## 2016-06-29 NOTE — Patient Instructions (Signed)
Follow-up with Dr. Caryl Comes on 07/09/16 at 2:00pm.  Call the device clinic at [phone] if you have worsening symptoms prior this appointment.

## 2016-06-29 NOTE — Progress Notes (Signed)
Wound check appointment, s/p BiV upgrade on 06/17/16. Dermabond removed. Wound without redness or edema. Stitch removed from L lateral incision, antibiotic ointment and bandaid applied. Incision edges otherwise approximated, wound well healed. Patient aware to call with signs/symptoms of infection.   Normal device function. Thresholds, sensing, and impedances consistent with implant measurements. Device programmed at chronic thresholds (RA and RV) and 3.5V/auto capture programmed on in LV for extra safety margin until 3 month f/u. Histogram distribution blunted, rate response turned on at nominal settings. BiV pacing 93.5% (effective 92.2%). 1 mode switch (40.8%), EGM suggests frequent PACs, +Xarelto, duration >5 days. No high ventricular rates noted. Patient reports more palpitations and PACs since procedure, reprogrammed AT/AF detection to >150bpm, lower rate to 80bpm with sleep rate on at 70bpm (sleep hours per patient preference)--all changes per SK. Patient educated about wound care, arm mobility, lifting restrictions. ROV with SK on 07/09/16 at 2:00pm.

## 2016-07-09 ENCOUNTER — Encounter: Payer: Self-pay | Admitting: Internal Medicine

## 2016-07-09 ENCOUNTER — Ambulatory Visit (INDEPENDENT_AMBULATORY_CARE_PROVIDER_SITE_OTHER): Payer: Medicare Other | Admitting: Internal Medicine

## 2016-07-09 VITALS — BP 138/40 | HR 41 | Ht 72.0 in | Wt 198.0 lb

## 2016-07-09 DIAGNOSIS — I4891 Unspecified atrial fibrillation: Secondary | ICD-10-CM | POA: Diagnosis not present

## 2016-07-09 DIAGNOSIS — I4892 Unspecified atrial flutter: Secondary | ICD-10-CM | POA: Diagnosis not present

## 2016-07-09 DIAGNOSIS — Z950 Presence of cardiac pacemaker: Secondary | ICD-10-CM | POA: Diagnosis not present

## 2016-07-09 DIAGNOSIS — I495 Sick sinus syndrome: Secondary | ICD-10-CM

## 2016-07-09 LAB — CUP PACEART INCLINIC DEVICE CHECK
Date Time Interrogation Session: 20170824170753
Implantable Lead Implant Date: 20130710
Implantable Lead Location: 753859
Implantable Lead Model: 5086
Lead Channel Pacing Threshold Amplitude: 0.5 V
Lead Channel Pacing Threshold Pulse Width: 0.4 ms
Lead Channel Pacing Threshold Pulse Width: 0.4 ms
Lead Channel Sensing Intrinsic Amplitude: 13.8 mV
MDC IDC LEAD IMPLANT DT: 20130517
MDC IDC LEAD IMPLANT DT: 20170802
MDC IDC LEAD LOCATION: 753858
MDC IDC LEAD LOCATION: 753860
MDC IDC LEAD MODEL: 5086
MDC IDC MSMT LEADCHNL LV PACING THRESHOLD AMPLITUDE: 1.25 V
MDC IDC MSMT LEADCHNL RA SENSING INTR AMPL: 3.5 mV
MDC IDC MSMT LEADCHNL RV PACING THRESHOLD AMPLITUDE: 0.75 V
MDC IDC MSMT LEADCHNL RV PACING THRESHOLD PULSEWIDTH: 0.4 ms

## 2016-07-09 NOTE — Progress Notes (Signed)
kf      Patient Care Team: Kirby Funk, [name] as PCP - General (Internal Medicine)   HPI  Walter Flavors, [name] is a 70 y.o. male Seen in followup for atrial arrhythmias for which he takes no antiarrhythmic currently. He is s/p PVI X 2 at [facility]  he is status post pacemaker for complete heart block. He  is seen now status post CRT P upgrade   He has a history of coronary artery disease with prior stenting; per TDS  It was elected to use long term aspirin and a NOAC and Rivaroxaban was started   His functional status is noted has gradually declined over recent months; this is confirmed by looking at his pacemaker activity sensor. There has been mild  shortness of breath and fatigue although no significant edema.  Blood pressures are mildly elevated in the afternoon in the 140/90 range. Morning blood pressures are good. He is no longer taking amlodipine. He is on losartan. Blood work was checked at [facility] with a potassium of 5.4 and a sodium of 133; he reminds me of a history of  possible RMSF associated with low sodiums of few years ago      > DATE TEST    1/14    ECHO   EF 55-60 %   1/17    MYOVIEW   EF 47 % NO ischemia  5/17 echo EF 40-45%    With deteriorating LV function and modest changes in functional status, we undertook CRT upgrade. He had more symptoms after this and it turned out he reverted to sinus rhythm.  He also is having frequent junctional premature beats comprising 5-10% associated with significantly more symptoms that he had with his persistent atrial fibrillation.  Discomfort and his device implant site has resolved.   Largely relegated to Atrial arrhythmia  90% since 3/17           Past Medical History:  Diagnosis Date  . Atrial flutter -atypical 2007   ABLATION x2 [facility]  . Atrial tachycardia (HCC) 2007   ABLATION [facility]  . Benign paroxysmal vertigo   . CAD (coronary artery disease)    a. abnl mv 01/2012;  b. cath 05/23/2012 LCX 80@ bifurcation w/ OM, 95% @ AV  groove, otw nonobs dzs, EF 55-65%;  . Carcinoma (HCC)    "off my chest"  . Familial tremor   . First degree AV block   . Heart murmur   . History of blood transfusion 2010   "2nd day after prostatectomy"  . Hyperlipidemia   . Hypertension   . Incontinence    DECREASING INCONTINENCE RELATED TO PROSTATECTOMY.  . Migraines   . Presence of permanent cardiac pacemaker 2013 ?  Marland Kitchen Prostate cancer (HCC)   . Pulsatile tinnitus   . Tachy-brady syndrome (HCC)    a. developed after RFCA @ [facility] 2013-> MDT PPM;  b. s/p RA lead revision 05/2012    Past Surgical History:  Procedure Laterality Date  . ATRIAL ABLATION SURGERY  04/01/12   "4th time I had one"  . CARDIAC CATHETERIZATION    . CARDIOVERSION  12/03/2011   Procedure: CARDIOVERSION;  Surgeon: Duke Salvia, [name];  Location: [facility] OR;  Service: Cardiovascular;  Laterality: [name];  . CARDIOVERSION  01/13/2012   Procedure: CARDIOVERSION;  Surgeon: Lewayne Bunting, [name];  Location: [facility] OR;  Service: Cardiovascular;  Laterality: [name];  . CARDIOVERSION  04/27/2012   Procedure: CARDIOVERSION;  Surgeon: Cassell Clement, [name];  Location: [facility] OR;  Service:  Cardiovascular;  Laterality: [name];  . COLONOSCOPY    . CORONARY ANGIOPLASTY WITH STENT PLACEMENT  06/02/12   "1; first one ever"  . EP IMPLANTABLE DEVICE [name] 06/17/2016   Procedure: BiV Pacemaker upgrade;  Surgeon: [name], [name];  Location: [place] CV LAB;  Service: Cardiovascular;  Laterality: [name];  . HERNIA REPAIR  1980's  . INGUINAL HERNIA REPAIR  1945   left side  . INSERT / REPLACE / REMOVE PACEMAKER  03/2012   initial placement  . INSERT / REPLACE / REMOVE PACEMAKER  05/25/12   "atrial lead change"  . LEAD REVISION [name] 05/25/2012   Procedure: LEAD REVISION;  Surgeon: Evans Lance, [name];  Location: [facility] CATH LAB;  Service: Cardiovascular;  Laterality: [name];  . PERCUTANEOUS CORONARY STENT INTERVENTION (PCI-S) [name] 06/02/2012   Procedure: PERCUTANEOUS CORONARY STENT INTERVENTION (PCI-S);  Surgeon:  Hillary Bow, [name];  Location: [facility] CATH LAB;  Service: Cardiovascular;  Laterality: [name];  . PROSTATECTOMY  [id number]   COMPLICATED  . SHOULDER SURGERY     Bilateral; "boney spurs removed"  . SKIN CANCER EXCISION     "in situ; right chest"  . TONSILLECTOMY AND ADENOIDECTOMY     "as a child"    Current Outpatient Prescriptions  Medication Sig Dispense Refill  . acyclovir (ZOVIRAX) 200 MG capsule Take 200 mg by mouth 2 (two) times daily as needed. For prophylaxis    . acyclovir (ZOVIRAX) 400 MG tablet Take 400 mg by mouth as needed. Herpes flare on legs    . aspirin EC 81 MG tablet Take 81 mg by mouth daily.    . cholecalciferol (VITAMIN D) 1000 UNITS tablet Take 1,000 Units by mouth daily.    Marland Kitchen losartan (COZAAR) 25 MG tablet Take 1 tablet (25 mg total) by mouth daily. 30 tablet 6  . nitroGLYCERIN (NITROSTAT) 0.4 MG SL tablet Place 0.4 mg under the tongue every 5 (five) minutes as needed for chest pain (x 3 tabs).    . propranolol (INDERAL) 10 MG tablet Take 10 mg by mouth 3 (three) times daily as needed (arrhythmias, tremor, migraines.Marland Kitchen).    Marland Kitchen rivaroxaban (XARELTO) 20 MG TABS tablet TAKE 1 TABLET (20 MG TOTAL) BY MOUTH DAILY WITH SUPPER. 90 tablet 3  . vitamin B-12 (CYANOCOBALAMIN) 1000 MCG tablet Take 1,000 mcg by mouth daily.     No current facility-administered medications for this visit.     Allergies  Allergen Reactions  . Ceclor [Cefaclor] Hives  . Chlorhexidine Itching and Rash  . Statins Other (See Comments)    Muscle weakness  . Ace Inhibitors Other (See Comments)    Does not take due to "angiotensin is responsible for maintaining bladder sphincter tone"    Review of Systems negative except from HPI and PMH  Physical Exam  BP (!) 138/40   Pulse (!) 41   Ht 6' (1.829 m)   Wt 198 lb (89.8 kg)   SpO2 95%   BMI 26.85 kg/m   Well developed and well nourished in no acute distress HENT normal E scleral and icterus clear Neck Supple Device pocket well healed; without  hematoma or erythema.  There is no tethering  JVP flat; carotids brisk and full Clear to ausculation  Regular rate and rhythm, 2/6 early systolic murmur Soft with active bowel sounds No clubbing cyanosis  Edema Alert and oriented, grossly normal motor and sensory function Skin Warm and Dry  ECG demonstratesAtrial flutter with underlying ventricular pacing  Assessment and  Plan  Atrial  flutter   Atrial fibrillation s/p ablation [facility] x2  Premature JUNCTIONAL BEATS  Sinus node dysfunction  Orthostatic lightheadedness improved  Pacemaker CRT - Medtronic  The patient's device was interrogated.  The information was reviewed. No changes were made in the programming.    Coronary artery disease s/p  DES stenting 2013>>long term dual therapy recommended per Dr Lilly Cove     We have reprogrammed his  Device to see if we can overdrive suppress the PJCs with only minimal benefit  We discussed strategies including alternative drugs, catheter ablation or inducing of atrial fibrillation. Right now, he would like to leave things the way they are.  We spent more than 50% of our >25 min visit in face to face counseling regarding the above

## 2016-07-09 NOTE — Patient Instructions (Signed)
Medication Instructions:  Your physician recommends that you continue on your current medications as directed. Please refer to the Current Medication list given to you today.  Labwork: None ordered  Testing/Procedures: None ordered  Follow-Up: Your physician recommends that you schedule a follow-up appointment in: 3 months with Dr. Klein..  -- If you need a refill on your cardiac medications before your next appointment, please call your pharmacy. --  Thank you for choosing [facility]!!         

## 2016-08-01 ENCOUNTER — Other Ambulatory Visit: Payer: Self-pay | Admitting: Internal Medicine

## 2016-08-18 ENCOUNTER — Encounter: Payer: Self-pay | Admitting: Gastroenterology

## 2016-08-18 ENCOUNTER — Ambulatory Visit (INDEPENDENT_AMBULATORY_CARE_PROVIDER_SITE_OTHER): Payer: Medicare Other | Admitting: Gastroenterology

## 2016-08-18 ENCOUNTER — Telehealth: Payer: Self-pay

## 2016-08-18 VITALS — BP 136/78 | HR 66 | Ht 72.0 in | Wt 197.0 lb

## 2016-08-18 DIAGNOSIS — I4892 Unspecified atrial flutter: Secondary | ICD-10-CM

## 2016-08-18 DIAGNOSIS — Z800 Family history of malignant neoplasm of digestive organs: Secondary | ICD-10-CM

## 2016-08-18 NOTE — Progress Notes (Signed)
[facility] Consult Note:  History: Walter Mt, MD 08/18/2016  Referring physician: Irven Shelling, MD  Reason for consult/chief complaint: Recall Colon (Famiy Hx of colon cancer-[name], pt is on Xarelto.)   Subjective  HPI:  I saw Dr. [name] today to consider a colonoscopy because of family history of colorectal cancer. [name] died from colon cancer in her late 30s. [name] last colonoscopy was 5 years ago. He had an episode of him, located diverticulitis in January 2017, CT scan of that was reviewed. He has paroxysmal A. fib and is maintained on Xarelto for stroke prophylaxis. It sounds like his single chamber pacemaker was converted to a Bi-V  pacemaker in early August of this year because he had moderate decrease in his LV function to 40 or 45%. He says that he has an implanted monitor, and it sounds like he has not recently had detectable A. fib, but instead a junctional rhythm which will sometimes account for irregular heart rate. He denies altered bowel habits rectal bleeding or weight loss.  ROS:  Review of Systems  He has occasional palpitations He has some urinary leakage since prostate surgery  Past Medical History: Past Medical History:  Diagnosis Date  . Atrial flutter -atypical 2007   ABLATION x2 [facility]  . Atrial tachycardia ([place]) 2007   ABLATION [facility]  . Benign paroxysmal vertigo   . CAD (coronary artery disease)    a. abnl mv 01/2012;  b. cath 05/23/2012 LCX 80@ bifurcation w/ OM, 95% @ AV groove, otw nonobs dzs, EF 55-65%;  . Carcinoma ([name])    "off my chest"  . Familial tremor   . First degree AV block   . Heart murmur   . History of blood transfusion 2010   "2nd day after prostatectomy"  . Hyperlipidemia   . Hypertension   . Incontinence    DECREASING INCONTINENCE RELATED TO PROSTATECTOMY.  . Migraines   . Presence of permanent cardiac pacemaker 2013 ?  [name] Prostate cancer ([name])   . Pulsatile tinnitus   . Tachy-brady syndrome ([name])    a.  developed after RFCA @ [facility] 2013-> MDT PPM;  b. s/p RA lead revision 05/2012   BiV pacer conversion Aug 2017 LVEF 40-45 % May 2017 PAF   Past Surgical History: Past Surgical History:  Procedure Laterality Date  . ATRIAL ABLATION SURGERY  04/01/12   "4th time I had one"  . CARDIAC CATHETERIZATION    . CARDIOVERSION  12/03/2011   Procedure: CARDIOVERSION;  Surgeon: [name], MD;  Location: [place];  Service: Cardiovascular;  Laterality: [name];  . CARDIOVERSION  01/13/2012   Procedure: CARDIOVERSION;  Surgeon: Lelon Perla, MD;  Location: [place];  Service: Cardiovascular;  Laterality: [name];  . CARDIOVERSION  04/27/2012   Procedure: CARDIOVERSION;  Surgeon: Darlin Coco, MD;  Location: [place];  Service: Cardiovascular;  Laterality: [name];  . COLONOSCOPY    . CORONARY ANGIOPLASTY WITH STENT PLACEMENT  06/02/12   "1; first one ever"  . EP IMPLANTABLE DEVICE [name] 06/17/2016   Procedure: BiV Pacemaker upgrade;  Surgeon: [name], MD;  Location: [place] [facility] LAB;  Service: Cardiovascular;  Laterality: [name];  . HERNIA REPAIR  1980's  . INGUINAL HERNIA REPAIR  1945   left side  . INSERT / REPLACE / REMOVE PACEMAKER  03/2012   initial placement  . INSERT / REPLACE / REMOVE PACEMAKER  05/25/12   "atrial lead change"  . LEAD REVISION [name] 05/25/2012   Procedure: LEAD REVISION;  Surgeon: Evans Lance, MD;  Location[facility] C[facility] LAB;  Service: Cardiovascular;  Laterality: [name];  . PERCUTANEOUS CORONARY STENT INTERVENTION (PCI-S) [name] 06/02/2012   Procedure: PERCUTANEOUS CORONARY STENT INTERVENTION (PCI-S);  Surgeon: Hillary Bow, MD;  Location: [facility] CATH LAB;  Service: Cardiovascular;  Laterality: [name];  . PROSTATECTOMY  [id number]   COMPLICATED  . SHOULDER SURGERY     Bilateral; "boney spurs removed"  . SKIN CANCER EXCISION     "in situ; right chest"  . TONSILLECTOMY AND ADENOIDECTOMY     "as a child"     Family History: Family History  Problem Relation Age of Onset  . Colon cancer [name]    . Heart disease Father   . Heart disease Brother     only one brother  . Diabetes Brother     only one brother    Social History: Social History   Social History  . Marital status: Married    Spouse name: [name]  . Number of children: [name]  . Years of education: [name]   Social History Main Topics  . Smoking status: Never Smoker  . Smokeless tobacco: Never Used  . Alcohol use 1.5 oz/week    3 Standard drinks or equivalent per week     Comment: 05/25/12 "last alcohol 02/2012"  . Drug use: No  . Sexual activity: Not Currently   Other Topics Concern  . None   Social History Narrative  . None    Allergies: Allergies  Allergen Reactions  . Ceclor [Cefaclor] Hives  . Chlorhexidine Itching and Rash  . Statins Other (See Comments)    Muscle weakness  . Ace Inhibitors Other (See Comments)    Does not take due to "angiotensin is responsible for maintaining bladder sphincter tone"    Outpatient Meds: Current Outpatient Prescriptions  Medication Sig Dispense Refill  . acyclovir (ZOVIRAX) 200 MG capsule Take 200 mg by mouth 2 (two) times daily as needed. For prophylaxis    . acyclovir (ZOVIRAX) 400 MG tablet Take 400 mg by mouth as needed. Herpes flare on legs    . aspirin EC 81 MG tablet Take 81 mg by mouth daily.    . cholecalciferol (VITAMIN D) 1000 UNITS tablet Take 1,000 Units by mouth daily.    [name] losartan (COZAAR) 25 MG tablet TAKE 1 TABLET(25 MG) BY MOUTH DAILY 30 tablet 10  . nitroGLYCERIN (NITROSTAT) 0.4 MG SL tablet Place 0.4 mg under the tongue every 5 (five) minutes as needed for chest pain (x 3 tabs).    . propranolol (INDERAL) 10 MG tablet Take 10 mg by mouth 3 (three) times daily as needed (arrhythmias, tremor, migraines.[name]).    [name] rivaroxaban (XARELTO) 20 MG TABS tablet TAKE 1 TABLET (20 MG TOTAL) BY MOUTH DAILY WITH SUPPER. 90 tablet 3  . vitamin B-12 (CYANOCOBALAMIN) 1000 MCG tablet Take 1,000 mcg by mouth daily.     No current facility-administered medications for  this visit.       ___________________________________________________________________ Objective   Exam:  BP 136/78   Pulse 66   Ht 6' (1.829 m)   Wt 197 lb (89.4 kg)   BMI 26.72 kg/m    General: this is a(n) Well-appearing elderly man with good muscle mass   Eyes: sclera anicteric, no redness  ENT: oral mucosa moist without lesions, no cervical or supraclavicular lymphadenopathy, good dentition  CV: RRR without murmur, S1/S2, no JVD, no peripheral edema  Resp: clear to auscultation bilaterally, normal RR and effort noted  GI: soft,  no tenderness, with active bowel sounds. No guarding or palpable organomegaly noted.  Skin; warm and dry, no rash or jaundice noted  Neuro: awake, alert and oriented x 3. Normal gross motor function and fluent speech  I reviewed his last cardiology office note  Assessment: Encounter Diagnoses  Name Primary?  . Family history of malignant neoplasm of colon in relative diagnosed when younger than 72 years of age Yes  . Atrial flutter, unspecified type [facility])     He needs a routine colonoscopy due to family history of colorectal cancer. Before we schedule it, we will communicate with his cardiologist about his CHADS score and therefore stroke risk if he is off [place]  2 days prior to colonoscopy, and perhaps up to 3 days afterwards if polyps are removed. He is concerned because an acquaintance suffered a CVA when off their [place] for the procedure. I do not know whether the recent pacemaker change what effect this.  Plan:  After we hear back from cardiology we will proceed accordingly.  Thank you for the courtesy of this consult.  Please call me with any questions or concerns.  Nelida Meuse III  CC: Walter Shelling, MD

## 2016-08-18 NOTE — Telephone Encounter (Signed)
08/18/2016   RE: [name], MD DOB: August 21, 1946 MRN: [id number]   Dear Dr Caryl Comes,    We would like to schedule the above patient for an endoscopic procedure (colonoscopy). Our records show that he is on anticoagulation therapy.   Please advise as to how long the patient may come off his therapy of Xarelto prior to the procedure. Dr Loletha Carrow would like to have cardiac clearance before scheduling his procedure. Please fax back/ or route the completed form to [phone]. Sincerely,    Magdalene River, CMA

## 2016-08-18 NOTE — Patient Instructions (Signed)
We will contact you with directions for a colonoscopy after we talk to Dr Caryl Comes.  If you are age 73 or older, your body mass index should be between 23-30. Your Body mass index is 26.72 kg/m. If this is out of the aforementioned range listed, please consider follow up with your Primary Care Provider.  If you are age 21 or younger, your body mass index should be between 19-25. Your Body mass index is 26.72 kg/m. If this is out of the aformentioned range listed, please consider follow up with your Primary Care Provider.   Thank you for choosing [facility]  Dr Wilfrid Lund III

## 2016-08-19 NOTE — Telephone Encounter (Signed)
He can stop taking the Rivaroxaban 36 hrs before the procedure.

## 2016-08-20 NOTE — Telephone Encounter (Signed)
That will work.    Please call Dr Prescott Parma to schedule his colonoscopy in the [facility].  Indication: family history of colon cancer.

## 2016-08-26 ENCOUNTER — Other Ambulatory Visit: Payer: Self-pay

## 2016-08-26 ENCOUNTER — Telehealth: Payer: Self-pay | Admitting: Internal Medicine

## 2016-08-26 MED ORDER — NA SULFATE-K SULFATE-MG SULF 17.5-3.13-1.6 GM/177ML PO SOLN: 1.0000 | Freq: Once | ORAL | 0 refills | Status: AC

## 2016-08-26 NOTE — Telephone Encounter (Signed)
New Message  Pt voiced wants nurse to contact him.  Pt voice no SOB and CP.

## 2016-08-26 NOTE — Telephone Encounter (Signed)
I called and spoke with the patient. He is scheduled for a colonscopy on 11/8. He states that Dr. Caryl Comes had ok'ed him to be off xarelto for 36 hours prior to the procedure. He states that the performing physician stated if he had to remove a polyp, then he would want him off xarelto for a few days after. [name] was wanting to know how many days Dr. Caryl Comes felt comfortable with him being off after the procedure if need be, in addition to the 36 hours prior. I advised I would review with Dr. Caryl Comes and call him back.  He is agreeable.

## 2016-08-26 NOTE — Telephone Encounter (Signed)
Yes, perhaps up to 3 days afterwards if polyps removed.

## 2016-08-26 NOTE — Telephone Encounter (Signed)
I know from our conversation in the office that he is worried because a friend had a stroke when off their anticoagulation for something. So let's do this:  Schedule the colonoscopy at [facility] endo.  That way, if their are any sizeable polyps, I will have the option of clipping or some other intervention on the site in order to decrease the risk of post- procedure bleeding. I may still need to hold the anticoagulant med if that occurs, but probably not for as long.  If no polyps removed, the med will be resumed that day.  - HD

## 2016-08-26 NOTE — Telephone Encounter (Signed)
Pt has been scheduled for 09-23-2016 in the [facility]. He has a question about the Xarelto. He wants to know what happens if you removed a polyp on his procedure. Would you need to hold Xarelto after?

## 2016-08-26 NOTE — Telephone Encounter (Signed)
Do I need to get the clearance from Dr Caryl Comes for that?

## 2016-08-27 NOTE — Telephone Encounter (Signed)
I left a message for the patient to call. 

## 2016-08-27 NOTE — Telephone Encounter (Signed)
Left message to return call 

## 2016-08-27 NOTE — Telephone Encounter (Signed)
Risks off anticoagulation are balanced by the risk of bleeding. Without a prior stroke, risks should be very low. CHADS-VASc score is in 2-3 range previously into an annual risk of about 3% or so which gives [place] a risk of 0.01% per day or so. His

## 2016-08-28 ENCOUNTER — Other Ambulatory Visit: Payer: Self-pay

## 2016-08-28 DIAGNOSIS — Z800 Family history of malignant neoplasm of digestive organs: Secondary | ICD-10-CM

## 2016-08-28 NOTE — Telephone Encounter (Signed)
Pt said he is returning your call 

## 2016-08-28 NOTE — Telephone Encounter (Signed)
Follow up      Calling to see what Dr Caryl Comes said about stopping xarelto an additional 3 days if polyps are found during the colonoscopy.

## 2016-08-28 NOTE — Telephone Encounter (Signed)
Pt contacted. He agrees with having the colonoscopy at [facility]. Pt has been scheduled for 10-29-2016 (Thurs). New instructions have been printed and mailed to home address on file.

## 2016-08-28 NOTE — Telephone Encounter (Signed)
Called Pt. Pt has questions about holding Xarelto r/t colonoscopy.  Will forward to Dr. Olin Pia nurse to f/u with pt.

## 2016-08-31 NOTE — Telephone Encounter (Signed)
I spoke with the patient and he is aware of Dr. Olin Pia recommendations regarding blood thinner.

## 2016-08-31 NOTE — Telephone Encounter (Signed)
See 10/11 phone note regarding Dr. Olin Pia recommendations coming off eliquis.

## 2016-08-31 NOTE — Telephone Encounter (Signed)
New message    Pt verbalized that he is returning rn call from 08/26/16

## 2016-09-01 ENCOUNTER — Telehealth: Payer: Self-pay | Admitting: Gastroenterology

## 2016-09-01 NOTE — Telephone Encounter (Signed)
Pt is calling back from previous message

## 2016-09-02 NOTE — Telephone Encounter (Signed)
New consent form mailed to pt with corrected information.

## 2016-09-23 ENCOUNTER — Encounter: Payer: Medicare Other | Admitting: Gastroenterology

## 2016-10-15 ENCOUNTER — Encounter: Payer: Self-pay | Admitting: Internal Medicine

## 2016-10-15 ENCOUNTER — Ambulatory Visit (INDEPENDENT_AMBULATORY_CARE_PROVIDER_SITE_OTHER): Payer: Medicare Other | Admitting: Internal Medicine

## 2016-10-15 VITALS — BP 140/88 | HR 89 | Ht 72.0 in | Wt 199.6 lb

## 2016-10-15 DIAGNOSIS — I4892 Unspecified atrial flutter: Secondary | ICD-10-CM

## 2016-10-15 DIAGNOSIS — Z950 Presence of cardiac pacemaker: Secondary | ICD-10-CM

## 2016-10-15 DIAGNOSIS — I492 Junctional premature depolarization: Secondary | ICD-10-CM

## 2016-10-15 DIAGNOSIS — I495 Sick sinus syndrome: Secondary | ICD-10-CM

## 2016-10-15 DIAGNOSIS — I519 Heart disease, unspecified: Secondary | ICD-10-CM

## 2016-10-15 DIAGNOSIS — I4891 Unspecified atrial fibrillation: Secondary | ICD-10-CM

## 2016-10-15 NOTE — Patient Instructions (Signed)
Medication Instructions:  Your physician recommends that you continue on your current medications as directed. Please refer to the Current Medication list given to you today.   Labwork: none  Testing/Procedures: Your physician has requested that you have an echocardiogram. Echocardiography is a painless test that uses sound waves to create images of your heart. It provides your doctor with information about the size and shape of your heart and how well your heart's chambers and valves are working. This procedure takes approximately one hour. There are no restrictions for this procedure.    Follow-Up: Your physician wants you to follow-up in: 6 months with Dr. Francena Hanly. You will receive a reminder letter in the mail two months in advance. If you don't receive a letter, please call our office to schedule the follow-up appointment.  Remote monitoring is used to monitor your Pacemaker orICD from home. This monitoring reduces the number of office visits required to check your device to one time per year. It allows [place] to keep an eye on the functioning of your device to ensure it is working properly. You are scheduled for a device check from home on 01/13/2017. You may send your transmission at any time that day. If you have a wireless device, the transmission will be sent automatically. After your physician reviews your transmission, you will receive a postcard with your next transmission date.     Any Other Special Instructions Will Be Listed Below (If Applicable).     If you need a refill on your cardiac medications before your next appointment, please call your pharmacy.

## 2016-10-16 LAB — CUP PACEART INCLINIC DEVICE CHECK
Battery Remaining Longevity: 116 mo
Battery Voltage: 3.14 V
Brady Statistic AS VS Percent: 11.33 %
Implantable Lead Implant Date: 20170802
Implantable Lead Location: 753858
Implantable Lead Location: 753860
Implantable Lead Model: 5086
Implantable Lead Model: 5086
Implantable Pulse Generator Implant Date: 20170802
Lead Channel Impedance Value: 323 Ohm
Lead Channel Impedance Value: 475 Ohm
Lead Channel Impedance Value: 817 Ohm
Lead Channel Impedance Value: 950 Ohm
Lead Channel Pacing Threshold Amplitude: 1 V
Lead Channel Pacing Threshold Amplitude: 1 V
Lead Channel Pacing Threshold Amplitude: 1 V
Lead Channel Pacing Threshold Pulse Width: 0.4 ms
Lead Channel Pacing Threshold Pulse Width: 0.4 ms
Lead Channel Sensing Intrinsic Amplitude: 14 mV
Lead Channel Sensing Intrinsic Amplitude: 3.8 mV
Lead Channel Setting Pacing Amplitude: 1.75 V
Lead Channel Setting Pacing Amplitude: 2.5 V
Lead Channel Setting Pacing Pulse Width: 0.4 ms
Lead Channel Setting Sensing Sensitivity: 4 mV
MDC IDC LEAD IMPLANT DT: 20130517
MDC IDC LEAD IMPLANT DT: 20130710
MDC IDC LEAD LOCATION: 753859
MDC IDC MSMT LEADCHNL LV IMPEDANCE VALUE: 779 Ohm
MDC IDC MSMT LEADCHNL LV IMPEDANCE VALUE: 855 Ohm
MDC IDC MSMT LEADCHNL LV IMPEDANCE VALUE: 912 Ohm
MDC IDC MSMT LEADCHNL LV IMPEDANCE VALUE: 931 Ohm
MDC IDC MSMT LEADCHNL RA IMPEDANCE VALUE: 342 Ohm
MDC IDC MSMT LEADCHNL RA PACING THRESHOLD PULSEWIDTH: 0.4 ms
MDC IDC MSMT LEADCHNL RV IMPEDANCE VALUE: 418 Ohm
MDC IDC SESS DTM: 20171130191908
MDC IDC SET LEADCHNL RA PACING AMPLITUDE: 2 V
MDC IDC SET LEADCHNL RV PACING PULSEWIDTH: 0.4 ms
MDC IDC STAT BRADY AP VP PERCENT: 73.73 %
MDC IDC STAT BRADY AP VS PERCENT: 1.63 %
MDC IDC STAT BRADY AS VP PERCENT: 13.24 %
MDC IDC STAT BRADY RA PERCENT PACED: 53.36 %
MDC IDC STAT BRADY RV PERCENT PACED: 90.77 %

## 2016-10-16 NOTE — Progress Notes (Signed)
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 He is s/p PVI X 2 at [facility]  he is status post pacemaker for complete heart block. He  is seen now status post CRT P upgrade   He has a history of coronary artery disease with prior stenting; per TDS  It was elected to use long term aspirin and a NOAC and Rivaroxaban was started   His functional status is noted has gradually declined over recent months; this is confirmed by looking at his pacemaker activity sensor. There has been mild  shortness of breath and fatigue although no significant edema.  Blood pressures are mildly elevated in the afternoon in the 140/90 range. Morning blood pressures are good. He is no longer taking amlodipine. He is on losartan. Blood work was checked at [facility] with a potassium of 5.4 and a sodium of 133; he reminds me of a history of  possible RMSF associated with low sodiums of few years ago      > DATE TEST    1/14    ECHO   EF 55-60 %   1/17    MYOVIEW   EF 47 % NO ischemia  5/17 echo EF 40-45%    With deteriorating LV function and modest changes in functional status, we undertook CRT upgrade. He had more symptoms after this and it turned out he reverted to sinus rhythm.  He also is having frequent junctional premature beats comprising 5-10% associated with significantly more symptoms that he had with his persistent atrial fibrillation.  He has noted some improvement as above           Past Medical History:  Diagnosis Date  . Atrial flutter -atypical 2007   ABLATION x2 [facility]  . Atrial tachycardia ([place]) 2007   ABLATION [facility]  . Benign paroxysmal vertigo   . CAD (coronary artery disease)    a. abnl mv 01/2012;  b. cath 05/23/2012 LCX 80@ bifurcation w/ OM, 95% @ AV groove, otw nonobs dzs, EF 55-65%;  . Carcinoma ([place])    "off my chest"    . Familial tremor   . First degree AV block   . Heart murmur   . History of blood transfusion 2010   "2nd day after prostatectomy"  . Hyperlipidemia   . Hypertension   . Incontinence    DECREASING INCONTINENCE RELATED TO PROSTATECTOMY.  . Migraines   . Presence of permanent cardiac pacemaker 2013 ?  Marland Kitchen Prostate cancer ([place])   . Pulsatile tinnitus   . Tachy-brady syndrome ([place])    a. developed after RFCA @ [facility] 2013-> MDT PPM;  b. s/p RA lead revision 05/2012    Past Surgical History:  Procedure Laterality Date  . ATRIAL ABLATION SURGERY  04/01/12   "4th time I had one"  . CARDIAC CATHETERIZATION    . CARDIOVERSION  12/03/2011   Procedure: CARDIOVERSION;  Surgeon: Walter Sprang, MD;  Location: [place];  Service: Cardiovascular;  Laterality: [name];  . CARDIOVERSION  01/13/2012   Procedure: CARDIOVERSION;  Surgeon: Walter Perla, MD;  Location: [place];  Service: Cardiovascular;  Laterality: [name];  . CARDIOVERSION  04/27/2012   Procedure: CARDIOVERSION;  Surgeon: Walter Coco, MD;  Location: [place];  Service: Cardiovascular;  Laterality: [name];  . COLONOSCOPY    .  CORONARY ANGIOPLASTY WITH STENT PLACEMENT  06/02/12   "1; first one ever"  . EP IMPLANTABLE DEVICE [name] 06/17/2016   Procedure: BiV Pacemaker upgrade;  Surgeon: Walter Sprang, MD;  Location: [place] CV LAB;  Service: Cardiovascular;  Laterality: [name];  . HERNIA REPAIR  1980's  . INGUINAL HERNIA REPAIR  1945   left side  . INSERT / REPLACE / REMOVE PACEMAKER  03/2012   initial placement  . INSERT / REPLACE / REMOVE PACEMAKER  05/25/12   "atrial lead change"  . LEAD REVISION [name] 05/25/2012   Procedure: LEAD REVISION;  Surgeon: Walter Lance, MD;  Location: [facility] CATH LAB;  Service: Cardiovascular;  Laterality: [name];  . PERCUTANEOUS CORONARY STENT INTERVENTION (PCI-S) [name] 06/02/2012   Procedure: PERCUTANEOUS CORONARY STENT INTERVENTION (PCI-S);  Surgeon: Walter Bow, MD;  Location: [facility] CATH LAB;  Service: Cardiovascular;   Laterality: [name];  . PROSTATECTOMY  [id number]   COMPLICATED  . SHOULDER SURGERY     Bilateral; "boney spurs removed"  . SKIN CANCER EXCISION     "in situ; right chest"  . TONSILLECTOMY AND ADENOIDECTOMY     "as a child"    Current Outpatient Prescriptions  Medication Sig Dispense Refill  . acyclovir (ZOVIRAX) 400 MG tablet Take 400 mg by mouth as needed. Herpes flare on legs    . aspirin EC 81 MG tablet Take 81 mg by mouth daily.    . cholecalciferol (VITAMIN D) 1000 UNITS tablet Take 1,000 Units by mouth daily.    Marland Kitchen losartan (COZAAR) 25 MG tablet TAKE 1 TABLET(25 MG) BY MOUTH DAILY 30 tablet 10  . nitroGLYCERIN (NITROSTAT) 0.4 MG SL tablet Place 0.4 mg under the tongue every 5 (five) minutes as needed for chest pain (x 3 tabs).    . propranolol (INDERAL) 10 MG tablet Take 10 mg by mouth 3 (three) times daily as needed (arrhythmias, tremor, migraines.Marland Kitchen).    Marland Kitchen rivaroxaban (XARELTO) 20 MG TABS tablet TAKE 1 TABLET (20 MG TOTAL) BY MOUTH DAILY WITH SUPPER. 90 tablet 3  . vitamin B-12 (CYANOCOBALAMIN) 1000 MCG tablet Take 1,000 mcg by mouth daily.     No current facility-administered medications for this visit.     Allergies  Allergen Reactions  . Ceclor [Cefaclor] Hives  . Chlorhexidine Itching and Rash  . Statins Other (See Comments)    Muscle weakness  . Ace Inhibitors Other (See Comments)    Does not take due to "angiotensin is responsible for maintaining bladder sphincter tone"    Review of Systems negative except from HPI and PMH  Physical Exam  BP 140/88   Pulse 89   Ht 6' (1.829 m)   Wt 199 lb 9.6 oz (90.5 kg)   SpO2 94%   BMI 27.07 kg/m   Well developed and well nourished in no acute distress HENT normal E scleral and icterus clear Neck Supple Device pocket well healed; without hematoma or erythema.  There is no tethering  JVP flat; carotids brisk and full Clear to ausculation  Regular rate and rhythm, 2/6 early systolic murmur Soft with active bowel  sounds No clubbing cyanosis  Edema Alert and oriented, grossly normal motor and sensory function Skin Warm and Dry  ECG demonstratesAtrial flutter with underlying ventricular pacing  Assessment and  Plan  Atrial flutter   Atrial fibrillation s/p ablation [facility] x2  Premature JUNCTIONAL BEATS  Sinus node dysfunction   Pacemaker CRT - Medtronic  The patient's device was interrogated.  The information was reviewed. No  changes were made in the programming.    Coronary artery disease s/p  DES stenting 2013>>long term dual therapy recommended per Dr Lilly Cove     We have reprogrammed the rate to 80 to try to overdrive suppress the PJCs. We will reassess LV function. He continues to have a large burden of premature beats. In the event that the LV function has recovered we will try and decrease the overdrive pacing rate towards 70 to try to minimize the impact of more rapid pacing. The burden of the extra beats on LV function should be minimal as PJCs as LV activation sequences are not situation normal.  Without symptoms of ischemia  Of late he has been in sinus rhythm predominantly  On Anticoagulation;  No bleeding issues

## 2016-10-26 ENCOUNTER — Encounter (HOSPITAL_COMMUNITY): Payer: Self-pay

## 2016-10-27 ENCOUNTER — Encounter (HOSPITAL_COMMUNITY): Payer: Self-pay

## 2016-10-27 ENCOUNTER — Ambulatory Visit (HOSPITAL_COMMUNITY): Admit: 2016-10-27 | Payer: Medicare Other | Admitting: Gastroenterology

## 2016-10-27 SURGERY — ESOPHAGOGASTRODUODENOSCOPY (EGD) WITH PROPOFOL
Anesthesia: Monitor Anesthesia Care

## 2016-10-29 ENCOUNTER — Encounter (HOSPITAL_COMMUNITY): Payer: Self-pay | Admitting: Anesthesiology

## 2016-10-29 ENCOUNTER — Ambulatory Visit (HOSPITAL_COMMUNITY): Payer: Medicare Other | Admitting: Anesthesiology

## 2016-10-29 ENCOUNTER — Ambulatory Visit (HOSPITAL_COMMUNITY)
Admission: RE | Admit: 2016-10-29 | Discharge: 2016-10-29 | Disposition: A | Discharge status: Home / Self Care | Payer: Medicare Other | Source: Ambulatory Visit | Attending: Gastroenterology | Admitting: Gastroenterology

## 2016-10-29 ENCOUNTER — Encounter (HOSPITAL_COMMUNITY)
Admission: RE | Disposition: A | Discharge status: Home / Self Care | Payer: Self-pay | Source: Ambulatory Visit | Attending: Gastroenterology

## 2016-10-29 DIAGNOSIS — Z85828 Personal history of other malignant neoplasm of skin: Secondary | ICD-10-CM | POA: Insufficient documentation

## 2016-10-29 DIAGNOSIS — Z950 Presence of cardiac pacemaker: Secondary | ICD-10-CM | POA: Diagnosis not present

## 2016-10-29 DIAGNOSIS — K621 Rectal polyp: Secondary | ICD-10-CM

## 2016-10-29 DIAGNOSIS — Z800 Family history of malignant neoplasm of digestive organs: Secondary | ICD-10-CM | POA: Diagnosis not present

## 2016-10-29 DIAGNOSIS — Z8546 Personal history of malignant neoplasm of prostate: Secondary | ICD-10-CM | POA: Insufficient documentation

## 2016-10-29 DIAGNOSIS — K5730 Diverticulosis of large intestine without perforation or abscess without bleeding: Secondary | ICD-10-CM | POA: Insufficient documentation

## 2016-10-29 DIAGNOSIS — E785 Hyperlipidemia, unspecified: Secondary | ICD-10-CM | POA: Insufficient documentation

## 2016-10-29 DIAGNOSIS — I2510 Atherosclerotic heart disease of native coronary artery without angina pectoris: Secondary | ICD-10-CM | POA: Insufficient documentation

## 2016-10-29 DIAGNOSIS — Z1211 Encounter for screening for malignant neoplasm of colon: Secondary | ICD-10-CM

## 2016-10-29 DIAGNOSIS — I10 Essential (primary) hypertension: Secondary | ICD-10-CM | POA: Insufficient documentation

## 2016-10-29 DIAGNOSIS — Z1212 Encounter for screening for malignant neoplasm of rectum: Secondary | ICD-10-CM

## 2016-10-29 HISTORY — PX: COLONOSCOPY WITH PROPOFOL: SHX5780

## 2016-10-29 SURGERY — COLONOSCOPY WITH PROPOFOL
Anesthesia: Monitor Anesthesia Care

## 2016-10-29 MED ORDER — PROPOFOL 10 MG/ML IV BOLUS
INTRAVENOUS | Status: AC
  Filled 2016-10-29: qty 40

## 2016-10-29 MED ORDER — PROPOFOL 10 MG/ML IV BOLUS
INTRAVENOUS | Status: DC | PRN
  Administered 2016-10-29 (×2): 20 mg via INTRAVENOUS

## 2016-10-29 MED ORDER — SODIUM CHLORIDE 0.9 % IV SOLN: INTRAVENOUS | Status: DC

## 2016-10-29 MED ORDER — LACTATED RINGERS IV SOLN
INTRAVENOUS | Status: DC
  Administered 2016-10-29: 1000 mL via INTRAVENOUS

## 2016-10-29 MED ORDER — PROPOFOL 500 MG/50ML IV EMUL
INTRAVENOUS | Status: DC | PRN
  Administered 2016-10-29: 75 ug/kg/min via INTRAVENOUS

## 2016-10-29 SURGICAL SUPPLY — 22 items

## 2016-10-29 NOTE — Transfer of Care (Signed)
Immediate Anesthesia Transfer of Care Note  Patient: [name], MD  Procedure(s) Performed: Procedure(s): COLONOSCOPY WITH PROPOFOL ([name])  Patient Location: PACU  Anesthesia Type:MAC  Level of Consciousness:  sedated, patient cooperative and responds to stimulation  Airway & Oxygen Therapy:Patient Spontanous Breathing and Patient connected to face mask oxgen  Post-op Assessment:  Report given to PACU RN and Post -op Vital signs reviewed and stable  Post vital signs:  Reviewed and stable  Last Vitals:  Vitals:   10/29/16 0905 10/29/16 0907  BP:  110/73  Pulse: 87   Resp: 19   Temp: 37 C     Complications: No apparent anesthesia complications

## 2016-10-29 NOTE — Op Note (Signed)
[facility] Patient Name: [name] Procedure Date: 10/29/2016 MRN: [id number] Attending MD: Estill Cotta. Loletha Carrow , MD Date of Birth: June 22, 1946 CSN: [id number] Age: 70 Admit Type: Outpatient Procedure:                Colonoscopy Indications:              Screening in patient at increased risk: Colorectal                            cancer in sister before age 42 Providers:                Estill Cotta. Loletha Carrow, MD, Laverta Baltimore RN, RN, Corliss Parish, Technician Referring MD:             [name], MD Medicines:                Monitored Anesthesia Care Complications:            No immediate complications. Estimated Blood Loss:     Estimated blood loss: none. Procedure:                Pre-Anesthesia Assessment:                           - Prior to the procedure, a History and Physical                            was performed, and patient medications and                            allergies were reviewed. The patient's tolerance of                            previous anesthesia was also reviewed. The risks                            and benefits of the procedure and the sedation                            options and risks were discussed with the patient.                            All questions were answered, and informed consent                            was obtained. Prior Anticoagulants: The patient has                            taken Xarelto (rivaroxaban), last dose was 2 days                            prior to procedure. ASA Grade Assessment: III - A  patient with severe systemic disease. After                            reviewing the risks and benefits, the patient was                            deemed in satisfactory condition to undergo the                            procedure.                           After obtaining informed consent, the colonoscope                            was passed under direct vision.  Throughout the                            procedure, the patient's blood pressure, pulse, and                            oxygen saturations were monitored continuously. The                            [id number]) scope was introduced through                            the anus and advanced to the the cecum, identified                            by appendiceal orifice and ileocecal valve. The                            colonoscopy was performed without difficulty. The                            patient tolerated the procedure well. The quality                            of the bowel preparation was excellent. The                            ileocecal valve, appendiceal orifice, and rectum                            were photographed. The quality of the bowel                            preparation was evaluated using the BBPS [facility]                            Bowel Preparation Scale) with scores of: Right  Colon = 3, Transverse Colon = 3 and Left Colon = 3                            (entire mucosa seen well with no residual staining,                            small fragments of stool or opaque liquid). The                            total BBPS score equals 9. The bowel preparation                            used was SUPREP. Scope In: 8:40:52 AM Scope Out: 9:00:07 AM Scope Withdrawal Time: 0 hours 15 minutes 15 seconds  Total Procedure Duration: 0 hours 19 minutes 15 seconds  Findings:      The perianal and digital rectal examinations were normal.      Multiple small-mouthed diverticula were found in the left colon.      A 4 mm polyp was found in the rectum. The polyp was sessile. The polyp       was removed with a cold snare. Resection and retrieval were complete.      The exam was otherwise without abnormality on direct and retroflexion       views. Impression:               - Diverticulosis in the left colon.                           - One 4 mm polyp  in the rectum, removed with a cold                            snare. Resected and retrieved.                           - The examination was otherwise normal on direct                            and retroflexion views. Moderate Sedation:      MAC sedation used Recommendation:           - Patient has a contact number available for                            emergencies. The signs and symptoms of potential                            delayed complications were discussed with the                            patient. Return to normal activities tomorrow.                            Written discharge instructions were provided to the  patient.                           - Resume previous diet.                           - Resume Xarelto (rivaroxaban) at prior dose                            tomorrow.                           - Await pathology results.                           - Repeat colonoscopy in 5 years for surveillance,                            even if hyperplastic (due to family history). Procedure Code(s):        --- Professional ---                           [phone], Colonoscopy, flexible; with removal of                            tumor(s), polyp(s), or other lesion(s) by snare                            technique Diagnosis Code(s):        --- Professional ---                           Z80.0, Family history of malignant neoplasm of                            digestive organs                           K62.1, Rectal polyp                           K57.30, Diverticulosis of large intestine without                            perforation or abscess without bleeding CPT copyright 2016 American Medical Association. All rights reserved. The codes documented in this report are preliminary and upon coder review may  be revised to meet current compliance requirements. [name] L. Loletha Carrow, MD 10/29/2016 9:09:28 AM This report has been signed electronically. Number of  Addenda: 0

## 2016-10-29 NOTE — Interval H&P Note (Signed)
History and Physical Interval Note:  10/29/2016 8:29 AM  [name], MD  has presented today for surgery, with the diagnosis of family hx of colon ca  The various methods of treatment have been discussed with the patient and family. After consideration of risks, benefits and other options for treatment, the patient has consented to  Procedure(s): COLONOSCOPY WITH PROPOFOL ([name]) as a surgical intervention .  The patient's history has been reviewed, patient examined, no change in status, stable for surgery.  I have reviewed the patient's chart and labs.  Questions were answered to the patient's satisfaction.     Nelida Meuse III

## 2016-10-29 NOTE — Anesthesia Postprocedure Evaluation (Signed)
Anesthesia Post Note  Patient: [name], MD  Procedure(s) Performed: Procedure(s) (LRB): COLONOSCOPY WITH PROPOFOL ([name])  Patient location during evaluation: PACU Anesthesia Type: MAC Level of consciousness: awake and alert Pain management: pain level controlled Vital Signs Assessment: post-procedure vital signs reviewed and stable Respiratory status: spontaneous breathing, nonlabored ventilation, respiratory function stable and patient connected to nasal cannula oxygen Cardiovascular status: stable and blood pressure returned to baseline Anesthetic complications: no    Last Vitals:  Vitals:   10/29/16 0905 10/29/16 0907  BP:  110/73  Pulse: 87   Resp: 19   Temp: 37 C     Last Pain:  Vitals:   10/29/16 0905  TempSrc: Oral                 Juanelle Trueheart S

## 2016-10-29 NOTE — Discharge Instructions (Signed)
Colonoscopy, Adult, Care After  This sheet gives you information about how to care for yourself after your procedure. Your health care provider may also give you more specific instructions. If you have problems or questions, contact your health care provider.  What can I expect after the procedure?  After the procedure, it is common to have:  · A small amount of blood in your stool for 24 hours after the procedure.  · Some gas.  · Mild abdominal cramping or bloating.    Follow these instructions at home:  General instructions     · For the first 24 hours after the procedure:  ? Do not drive or use machinery.  ? Do not sign important documents.  ? Do not drink alcohol.  ? Do your regular daily activities at a slower pace than normal.  ? Eat soft, easy-to-digest foods.  ? Rest often.  · Take over-the-counter or prescription medicines only as told by your health care provider.  · It is up to you to get the results of your procedure. Ask your health care provider, or the department performing the procedure, when your results will be ready.  Relieving cramping and bloating   · Try walking around when you have cramps or feel bloated.  · Apply heat to your abdomen as told by your health care provider. Use a heat source that your health care provider recommends, such as a moist heat pack or a heating pad.  ? Place a towel between your skin and the heat source.  ? Leave the heat on for 20-30 minutes.  ? Remove the heat if your skin turns bright red. This is especially important if you are unable to feel pain, heat, or cold. You may have a greater risk of getting burned.  Eating and drinking   · Drink enough fluid to keep your urine clear or pale yellow.  · Resume your normal diet as instructed by your health care provider. Avoid heavy or fried foods that are hard to digest.  · Avoid drinking alcohol for as long as instructed by your health care provider.  Contact a health care provider if:  · You have blood in your stool 2-3  days after the procedure.  Get help right away if:  · You have more than a small spotting of blood in your stool.  · You pass large blood clots in your stool.  · Your abdomen is swollen.  · You have nausea or vomiting.  · You have a fever.  · You have increasing abdominal pain that is not relieved with medicine.  This information is not intended to replace advice given to you by your health care provider. Make sure you discuss any questions you have with your health care provider.  Document Released: 06/16/2004 Document Revised: 07/27/2016 Document Reviewed: 01/14/2016  Elsevier Interactive Patient Education © 2017 Elsevier Inc.

## 2016-10-29 NOTE — H&P (Signed)
History:  This patient presents for endoscopic testing for colon cancer screening (family history CRC).  [name], MD Referring physician: Irven Shelling, MD  Past Medical History: Past Medical History:  Diagnosis Date  . Atrial flutter -atypical 2007   ABLATION x2 [facility]  . Atrial tachycardia ([place]) 2007   ABLATION [facility]  . Benign paroxysmal vertigo   . CAD (coronary artery disease)    a. abnl mv 01/2012;  b. cath 05/23/2012 LCX 80@ bifurcation w/ OM, 95% @ AV groove, otw nonobs dzs, EF 55-65%;  . Carcinoma ([place])    skin cancer "off my chest"  . Familial tremor   . First degree AV block    had 4 ablasions that left py with heart block  . History of blood transfusion 2010   "2nd day after prostatectomy"  . Hyperlipidemia   . Hypertension   . Incontinence    DECREASING INCONTINENCE RELATED TO PROSTATECTOMY.  . Migraines   . Presence of permanent cardiac pacemaker 2013 ?  Marland Kitchen Prostate cancer ([place])   . Pulsatile tinnitus   . 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 developed after RFCA @ [facility] 2013-> MDT PPM;  b. s/p RA lead revision 05/2012     Past Surgical History: Past Surgical History:  Procedure Laterality Date  . ATRIAL ABLATION SURGERY  04/01/12   "4th time I had one"  . CARDIAC CATHETERIZATION    . CARDIOVERSION  12/03/2011   Procedure: CARDIOVERSION;  Surgeon: [name], MD;  Location: [place];  Service: Cardiovascular;  Laterality: [name];  . CARDIOVERSION  01/13/2012   Procedure: CARDIOVERSION;  Surgeon: Lelon Perla, MD;  Location: [place];  Service: Cardiovascular;  Laterality: [name];  . CARDIOVERSION  04/27/2012   Procedure: CARDIOVERSION;  Surgeon: Darlin Coco, MD;  Location: [place];  Service: Cardiovascular;  Laterality: [name];  . COLONOSCOPY    . CORONARY ANGIOPLASTY WITH STENT PLACEMENT  06/02/12   "1; first one ever"  . EP IMPLANTABLE DEVICE [name] 06/17/2016   Procedure: BiV Pacemaker upgrade;  Surgeon: [name], MD;  Location: [place] CV LAB;  Service:  Cardiovascular;  Laterality: [name];  . HERNIA REPAIR  1980's  . INGUINAL HERNIA REPAIR  1945   left side  . INSERT / REPLACE / REMOVE PACEMAKER  03/2012   initial placement  . INSERT / REPLACE / REMOVE PACEMAKER  05/25/12   "atrial lead change"  . LEAD REVISION [name] 05/25/2012   Procedure: LEAD REVISION;  Surgeon: Evans Lance, MD;  Location: [facility] CATH LAB;  Service: Cardiovascular;  Laterality: [name];  . PERCUTANEOUS CORONARY STENT INTERVENTION (PCI-S) [name] 06/02/2012   Procedure: PERCUTANEOUS CORONARY STENT INTERVENTION (PCI-S);  Surgeon: Hillary Bow, MD;  Location: [facility] CATH LAB;  Service: Cardiovascular;  Laterality: [name];  . PROSTATECTOMY  [id number]   COMPLICATED  . SHOULDER SURGERY     Bilateral; "boney spurs removed"  . SKIN CANCER EXCISION     "in situ; right chest"  . TONSILLECTOMY AND ADENOIDECTOMY     "as a child"    Allergies: Allergies  Allergen Reactions  . Ceclor [Cefaclor] Hives  . Chlorhexidine Itching and Rash  . Statins Other (See Comments)    Muscle weakness  . Ace Inhibitors Other (See Comments)    Does not take due to "angiotensin is responsible for maintaining bladder sphincter tone"    Outpatient Meds: Current Facility-Administered Medications  Medication Dose Route Frequency Provider Last Rate Last Dose  . 0.9 %  sodium chloride infusion  Intravenous Continuous Nelida Meuse III, MD      . lactated ringers infusion   Intravenous Continuous Nelida Meuse III, MD 10 mL/hr at 10/29/16 0732 1,000 mL at 10/29/16 0732      ___________________________________________________________________ Objective   Exam:  BP (!) 153/99   Pulse 85   Temp 98 F (36.7 C) (Oral)   Resp 20   Ht 6' (1.829 m)   Wt 190 lb (86.2 kg)   SpO2 99%   BMI 25.77 kg/m    CV: RRR without murmur, S1/S2, no JVD, no peripheral edema  Resp: clear to auscultation bilaterally, irregular rhythm and effort noted  GI: soft, no tenderness, with active bowel sounds. No guarding or  palpable organomegaly noted.  Neuro: awake, alert and oriented x 3. Normal gross motor function and fluent speech   Assessment:  screening  Plan:  Colonoscopy (has been off xarelto for 2 days)   Nelida Meuse III

## 2016-10-29 NOTE — Anesthesia Preprocedure Evaluation (Signed)
Anesthesia Evaluation  Patient identified by MRN, date of birth, ID band Patient awake    Reviewed: Allergy & Precautions, NPO status , Patient's Chart, lab work & pertinent test results  Airway Mallampati: II  TM Distance: >3 FB Neck ROM: Full    Dental no notable dental hx.    Pulmonary neg pulmonary ROS,    Pulmonary exam normal breath sounds clear to auscultation       Cardiovascular hypertension, + CAD  Normal cardiovascular exam+ pacemaker  Rhythm:Regular Rate:Normal     Neuro/Psych negative neurological ROS  negative psych ROS   GI/Hepatic negative GI ROS, Neg liver ROS,   Endo/Other  negative endocrine ROS  Renal/GU negative Renal ROS  negative genitourinary   Musculoskeletal negative musculoskeletal ROS (+)   Abdominal   Peds negative pediatric ROS (+)  Hematology negative hematology ROS (+)   Anesthesia Other Findings   Reproductive/Obstetrics negative OB ROS                             Anesthesia Physical Anesthesia Plan  ASA: III  Anesthesia Plan: MAC   Post-op Pain Management:    Induction: Intravenous  Airway Management Planned: Nasal Cannula  Additional Equipment:   Intra-op Plan:   Post-operative Plan: Extubation in OR  Informed Consent: I have reviewed the patients History and Physical, chart, labs and discussed the procedure including the risks, benefits and alternatives for the proposed anesthesia with the patient or authorized representative who has indicated his/her understanding and acceptance.   Dental advisory given  Plan Discussed with: CRNA and Surgeon  Anesthesia Plan Comments:         Anesthesia Quick Evaluation

## 2016-10-30 ENCOUNTER — Encounter (HOSPITAL_COMMUNITY): Payer: Self-pay | Admitting: Gastroenterology

## 2016-11-02 ENCOUNTER — Encounter: Payer: Self-pay | Admitting: Gastroenterology

## 2016-11-02 NOTE — Progress Notes (Signed)
Letter mailed

## 2016-11-06 ENCOUNTER — Other Ambulatory Visit: Payer: Self-pay

## 2016-11-06 ENCOUNTER — Ambulatory Visit (HOSPITAL_COMMUNITY): Payer: Medicare Other | Attending: Cardiology

## 2016-11-06 DIAGNOSIS — I4892 Unspecified atrial flutter: Secondary | ICD-10-CM | POA: Diagnosis not present

## 2016-11-06 DIAGNOSIS — I492 Junctional premature depolarization: Secondary | ICD-10-CM | POA: Diagnosis not present

## 2016-11-06 DIAGNOSIS — I119 Hypertensive heart disease without heart failure: Secondary | ICD-10-CM | POA: Insufficient documentation

## 2016-11-06 DIAGNOSIS — I340 Nonrheumatic mitral (valve) insufficiency: Secondary | ICD-10-CM | POA: Diagnosis not present

## 2016-11-06 DIAGNOSIS — I4891 Unspecified atrial fibrillation: Secondary | ICD-10-CM

## 2016-11-06 DIAGNOSIS — I2510 Atherosclerotic heart disease of native coronary artery without angina pectoris: Secondary | ICD-10-CM | POA: Diagnosis not present

## 2016-11-06 DIAGNOSIS — Z950 Presence of cardiac pacemaker: Secondary | ICD-10-CM | POA: Diagnosis not present

## 2016-11-06 DIAGNOSIS — I371 Nonrheumatic pulmonary valve insufficiency: Secondary | ICD-10-CM | POA: Insufficient documentation

## 2016-11-06 DIAGNOSIS — I351 Nonrheumatic aortic (valve) insufficiency: Secondary | ICD-10-CM | POA: Insufficient documentation

## 2016-11-06 DIAGNOSIS — I77810 Thoracic aortic ectasia: Secondary | ICD-10-CM | POA: Insufficient documentation

## 2016-11-06 DIAGNOSIS — E785 Hyperlipidemia, unspecified: Secondary | ICD-10-CM | POA: Diagnosis not present

## 2016-11-06 DIAGNOSIS — I519 Heart disease, unspecified: Secondary | ICD-10-CM | POA: Diagnosis present

## 2016-11-06 DIAGNOSIS — I071 Rheumatic tricuspid insufficiency: Secondary | ICD-10-CM | POA: Diagnosis not present

## 2016-11-06 DIAGNOSIS — I495 Sick sinus syndrome: Secondary | ICD-10-CM | POA: Diagnosis not present

## 2016-11-10 ENCOUNTER — Telehealth: Payer: Self-pay | Admitting: Internal Medicine

## 2016-11-10 DIAGNOSIS — I480 Paroxysmal atrial fibrillation: Secondary | ICD-10-CM

## 2016-11-10 DIAGNOSIS — I428 Other cardiomyopathies: Secondary | ICD-10-CM

## 2016-11-10 NOTE — Telephone Encounter (Signed)
Echo not reviewed by Dr. Caryl Comes yet- EF: 35-40%. Will forward to Dr. Caryl Comes to review prior to calling the patient.

## 2016-11-10 NOTE — Telephone Encounter (Signed)
New message      Talk to the nurse regarding recent echo.

## 2016-11-19 NOTE — Telephone Encounter (Signed)
Per Dr. Caryl Comes- he had Dr. Stanford Breed compare his most recent echo to his last echo and then Dr. Stanford Breed was going to call the patient with results.

## 2016-11-20 ENCOUNTER — Telehealth: Payer: Self-pay | Admitting: Internal Medicine

## 2016-11-20 NOTE — Telephone Encounter (Signed)
[name] is calling because Dr. Caryl Comes told him he was going to call him about a week ago and he has not received a call . Please call

## 2016-11-24 ENCOUNTER — Other Ambulatory Visit: Payer: Self-pay | Admitting: Internal Medicine

## 2016-11-24 MED ORDER — LOSARTAN POTASSIUM 25 MG PO TABS: 25.0000 mg | ORAL_TABLET | Freq: Two times a day (BID) | ORAL | 6 refills | Status: DC

## 2016-11-24 NOTE — Telephone Encounter (Signed)
Orders placed for increase in losartan to 25 mg BID- updated RX sent to the pharmacy.  Echo order placed- will forward to [facility] to call and arrange for 3 months out.  Will forward to [facility] to address reprogramming his device.

## 2016-11-24 NOTE — Telephone Encounter (Addendum)
Spoke with patient about reprogramming pts device lower rate per Dr. Caryl Comes  pt agreeable to apt w/ device clinic at 9:00 11/25/2016.

## 2016-11-24 NOTE — Telephone Encounter (Signed)
Spoke with pt ECHO reviewed by [facility] essentially equivalent Will increase losartan to 25 BID and reassess LVEF in about 3 months with repeat echo prior to initiating BB (he is reluctant) Will also reprogram his pacing rate 80>>70.  We had reprogrammed in Nov 2017 70>>80 to see if we could overdrive PJCs.  He has not noted any appreciable difference

## 2016-11-24 NOTE — Addendum Note (Signed)
Addended by: Alvis Lemmings C on: 11/24/2016 02:28 PM   Modules accepted: Orders

## 2016-11-24 NOTE — Telephone Encounter (Signed)
See 12/26 phone note.

## 2016-11-25 ENCOUNTER — Ambulatory Visit (INDEPENDENT_AMBULATORY_CARE_PROVIDER_SITE_OTHER): Payer: Medicare Other | Admitting: *Deleted

## 2016-11-25 DIAGNOSIS — I428 Other cardiomyopathies: Secondary | ICD-10-CM | POA: Diagnosis not present

## 2016-11-25 NOTE — Progress Notes (Signed)
Reprogrammed lower rate from 80bpm to 70bpm per SK.

## 2016-12-02 ENCOUNTER — Other Ambulatory Visit: Payer: Self-pay | Admitting: Internal Medicine

## 2016-12-30 ENCOUNTER — Encounter: Payer: Self-pay | Admitting: *Deleted

## 2017-01-13 ENCOUNTER — Encounter: Payer: Medicare Other | Admitting: *Deleted

## 2017-01-13 ENCOUNTER — Telehealth: Payer: Self-pay | Admitting: Cardiology

## 2017-01-13 NOTE — Telephone Encounter (Signed)
Spoke with pt and reminded pt of remote transmission that is due today. Pt verbalized understanding.   

## 2017-01-19 ENCOUNTER — Telehealth: Payer: Self-pay | Admitting: *Deleted

## 2017-01-19 NOTE — Telephone Encounter (Signed)
Spoke with [name] and informed him it could take 10 business days for home monitor to be delivered and for him to call the office Monday 3/12 if he has not received his monitor. Pt voiced understanding.

## 2017-01-19 NOTE — Telephone Encounter (Signed)
New Message   Pt states that he was supposed to be sent a pacemaker last week and it never came so he cannot send his remote transmissions. Requests a call back.

## 2017-02-23 ENCOUNTER — Ambulatory Visit (HOSPITAL_COMMUNITY): Payer: Medicare Other | Attending: Internal Medicine

## 2017-02-23 ENCOUNTER — Other Ambulatory Visit: Payer: Self-pay

## 2017-02-23 DIAGNOSIS — I340 Nonrheumatic mitral (valve) insufficiency: Secondary | ICD-10-CM | POA: Diagnosis not present

## 2017-02-23 DIAGNOSIS — I371 Nonrheumatic pulmonary valve insufficiency: Secondary | ICD-10-CM | POA: Diagnosis not present

## 2017-02-23 DIAGNOSIS — I119 Hypertensive heart disease without heart failure: Secondary | ICD-10-CM | POA: Diagnosis not present

## 2017-02-23 DIAGNOSIS — I4891 Unspecified atrial fibrillation: Secondary | ICD-10-CM | POA: Diagnosis present

## 2017-02-23 DIAGNOSIS — I480 Paroxysmal atrial fibrillation: Secondary | ICD-10-CM

## 2017-02-23 DIAGNOSIS — I428 Other cardiomyopathies: Secondary | ICD-10-CM | POA: Insufficient documentation

## 2017-02-23 DIAGNOSIS — I77810 Thoracic aortic ectasia: Secondary | ICD-10-CM | POA: Insufficient documentation

## 2017-02-23 DIAGNOSIS — I361 Nonrheumatic tricuspid (valve) insufficiency: Secondary | ICD-10-CM | POA: Insufficient documentation

## 2017-02-23 DIAGNOSIS — I429 Cardiomyopathy, unspecified: Secondary | ICD-10-CM | POA: Diagnosis present

## 2017-03-04 ENCOUNTER — Other Ambulatory Visit: Payer: Self-pay | Admitting: *Deleted

## 2017-03-04 MED ORDER — RIVAROXABAN 20 MG PO TABS: ORAL_TABLET | ORAL | 5 refills | Status: DC

## 2017-03-04 NOTE — Telephone Encounter (Signed)
Received request for Xarelto 20mg ; pt is 71 yrs old, weight-90.5kg on 10/15/16, Crea-1.06 on 06/15/16, CrCl-80.8ml/min. Will send in refill request for requested 30 day supply with refills.

## 2017-03-04 NOTE — Telephone Encounter (Signed)
Patient called and stated that his last refill was authorized for #90 but he only wants #30 with each refill.

## 2017-03-15 ENCOUNTER — Telehealth: Payer: Self-pay | Admitting: Internal Medicine

## 2017-03-15 NOTE — Telephone Encounter (Signed)
Returning Dr. Blain Pais call. C/o diaphragmatic stimulation Saturday morning about 2 am when laying on right side as well as Sunday morning and none last night. He will send transmission to verify normal device function and then I can call back. If he wishes we can arrange device clinic appt for LV vector testing to potentially decrease/eliminate stimulation.

## 2017-03-15 NOTE — Telephone Encounter (Signed)
New Message    Per pt he had Diaphramatic pacing this weekend, Wants to know if he needs to come have his lead checked. Requesting call back

## 2017-03-15 NOTE — Telephone Encounter (Signed)
Transmission reviewed. Normal device function. AF noted. [name] wishes to wait for appt with Dr. Caryl Comes 5/29. He will call back if stimulation worsens. He's concerned about LV lead moving and questioned fixation of LV lead. I informed him that active fixation leads are not used for LV leads due to perforation of coronary artery and the means of fixation is a curved lead that "wedges" the lead in the coronary artery and that movement may occur. He verbalizes understanding.

## 2017-03-24 ENCOUNTER — Encounter: Payer: Self-pay | Admitting: Internal Medicine

## 2017-04-13 ENCOUNTER — Ambulatory Visit (INDEPENDENT_AMBULATORY_CARE_PROVIDER_SITE_OTHER): Payer: Medicare Other | Admitting: Internal Medicine

## 2017-04-13 ENCOUNTER — Encounter: Payer: Self-pay | Admitting: Internal Medicine

## 2017-04-13 VITALS — BP 128/84 | HR 63 | Ht 72.0 in | Wt 194.0 lb

## 2017-04-13 DIAGNOSIS — I480 Paroxysmal atrial fibrillation: Secondary | ICD-10-CM | POA: Diagnosis not present

## 2017-04-13 DIAGNOSIS — I4892 Unspecified atrial flutter: Secondary | ICD-10-CM

## 2017-04-13 DIAGNOSIS — I495 Sick sinus syndrome: Secondary | ICD-10-CM

## 2017-04-13 DIAGNOSIS — I428 Other cardiomyopathies: Secondary | ICD-10-CM | POA: Diagnosis not present

## 2017-04-13 DIAGNOSIS — Z950 Presence of cardiac pacemaker: Secondary | ICD-10-CM

## 2017-04-13 LAB — CUP PACEART INCLINIC DEVICE CHECK
Brady Statistic AP VS Percent: 0.27 %
Brady Statistic AS VP Percent: 15.89 %
Brady Statistic AS VS Percent: 13.56 %
Implantable Lead Implant Date: 20170802
Implantable Lead Location: 753859
Implantable Pulse Generator Implant Date: 20170802
Lead Channel Impedance Value: 361 Ohm
Lead Channel Impedance Value: 494 Ohm
Lead Channel Impedance Value: 760 Ohm
Lead Channel Impedance Value: 988 Ohm
Lead Channel Pacing Threshold Amplitude: 0.75 V
Lead Channel Pacing Threshold Pulse Width: 0.4 ms
Lead Channel Pacing Threshold Pulse Width: 0.4 ms
Lead Channel Sensing Intrinsic Amplitude: 13.5 mV
Lead Channel Sensing Intrinsic Amplitude: 13.75 mV
Lead Channel Setting Pacing Amplitude: 1.5 V
Lead Channel Setting Pacing Pulse Width: 0.4 ms
Lead Channel Setting Sensing Sensitivity: 4 mV
MDC IDC LEAD IMPLANT DT: 20130517
MDC IDC LEAD IMPLANT DT: 20130710
MDC IDC LEAD LOCATION: 753858
MDC IDC LEAD LOCATION: 753860
MDC IDC MSMT BATTERY REMAINING LONGEVITY: 114 mo
MDC IDC MSMT BATTERY VOLTAGE: 3.03 V
MDC IDC MSMT LEADCHNL LV IMPEDANCE VALUE: 1159 Ohm
MDC IDC MSMT LEADCHNL LV IMPEDANCE VALUE: 931 Ohm
MDC IDC MSMT LEADCHNL LV IMPEDANCE VALUE: 988 Ohm
MDC IDC MSMT LEADCHNL LV IMPEDANCE VALUE: 988 Ohm
MDC IDC MSMT LEADCHNL LV PACING THRESHOLD AMPLITUDE: 1 V
MDC IDC MSMT LEADCHNL RA IMPEDANCE VALUE: 361 Ohm
MDC IDC MSMT LEADCHNL RA PACING THRESHOLD AMPLITUDE: 0.75 V
MDC IDC MSMT LEADCHNL RA SENSING INTR AMPL: 3.375 mV
MDC IDC MSMT LEADCHNL RA SENSING INTR AMPL: 3.875 mV
MDC IDC MSMT LEADCHNL RV IMPEDANCE VALUE: 456 Ohm
MDC IDC MSMT LEADCHNL RV PACING THRESHOLD PULSEWIDTH: 0.4 ms
MDC IDC SESS DTM: 20180529153556
MDC IDC SET LEADCHNL RA PACING AMPLITUDE: 2 V
MDC IDC SET LEADCHNL RV PACING AMPLITUDE: 2.5 V
MDC IDC SET LEADCHNL RV PACING PULSEWIDTH: 0.4 ms
MDC IDC STAT BRADY AP VP PERCENT: 70.16 %
MDC IDC STAT BRADY RA PERCENT PACED: 38.99 %
MDC IDC STAT BRADY RV PERCENT PACED: 92.89 %

## 2017-04-13 MED ORDER — SACUBITRIL-VALSARTAN 24-26 MG PO TABS: 1.0000 | ORAL_TABLET | Freq: Two times a day (BID) | ORAL | 0 refills | Status: DC

## 2017-04-13 NOTE — Patient Instructions (Addendum)
Medication Instructions: - Your physician has recommended you make the following change in your medication: 1) Stop losartan for 36 hours prior to starting entresto 2) then Start entresto 24/26 mg - take one tablet by mouth twice daily  Labwork: - none ordered  Procedures/Testing: - none ordered  Follow-Up: - Your physician recommends that you schedule a follow-up appointment in: 3 months with Dr. Caryl Comes.    Any Additional Special Instructions Will Be Listed Below (If Applicable).     If you need a refill on your cardiac medications before your next appointment, please call your pharmacy.

## 2017-04-13 NOTE — Progress Notes (Signed)
kf      Patient Care Team: [name], [name] as PCP - General (Internal Medicine)   HPI  Walter Mt, [name] is a 71 y.o. male Seen in followup for atrial arrhythmias for which he takes no antiarrhythmic currently. He is s/p PVI X 2 at [facility]  he is status post pacemaker for complete heart block. He  is seen now status post CRT P upgrade   He has a history of coronary artery disease with prior stenting; per TDS  It was elected to use long term aspirin and a NOAC and Rivaroxaban was started . He has had no bleeding issues      He has palpitations periodically. He does not note significant changes in functional status related to palpitations. He has noted some lightheadedness which is worse since we made his losartan twice a day.  There is been no peripheral edema. No chest pain.  > DATE TEST    1/14    ECHO   EF 55-60 %   1/17    MYOVIEW   EF 47 %          No  ischemia  5/17 Echo EF 40-45%   4/18 Echo EF 40-45%    With deteriorating LV function and modest changes in functional status, we undertook CRT upgrade. He had more symptoms after this and it turned out he reverted to sinus rhythm.  He also is having frequent junctional premature beats comprising 5-10% associated with significantly more symptoms that he had with his persistent atrial fibrillation.  April 18 he called in because of diaphragmatic stimulation   This resolved spontaneously     Past Medical History:  Diagnosis Date  . Atrial flutter -atypical 2007   ABLATION x2 [facility]  . Atrial tachycardia ([place]) 2007   ABLATION [facility]  . Benign paroxysmal vertigo   . CAD (coronary artery disease)    a. abnl mv 01/2012;  b. cath 05/23/2012 LCX 80@ bifurcation w/ OM, 95% @ AV groove, otw nonobs dzs, EF 55-65%;  . Carcinoma ([place])    skin cancer "off my chest"  . Familial tremor   . First degree AV block    had 4 ablasions that left py with heart block  . History of blood transfusion 2010   "2nd day after prostatectomy"  .  Hyperlipidemia   . Hypertension   . Incontinence    DECREASING INCONTINENCE RELATED TO PROSTATECTOMY.  . Migraines   . Presence of permanent cardiac pacemaker 2013 ?  Marland Kitchen Prostate cancer ([place])   . Pulsatile tinnitus   . Tachy-brady syndrome ([place])    a. developed after RFCA @ [facility] 2013-> MDT PPM;  b. s/p RA lead revision 05/2012    Past Surgical History:  Procedure Laterality Date  . ATRIAL ABLATION SURGERY  04/01/12   "4th time I had one"  . CARDIAC CATHETERIZATION    . CARDIOVERSION  12/03/2011   Procedure: CARDIOVERSION;  Surgeon: [name], [name];  Location: [place];  Service: Cardiovascular;  Laterality: [name];  . CARDIOVERSION  01/13/2012   Procedure: CARDIOVERSION;  Surgeon: Lelon Perla, [name];  Location: [place];  Service: Cardiovascular;  Laterality: [name];  . CARDIOVERSION  04/27/2012   Procedure: CARDIOVERSION;  Surgeon: Darlin Coco, [name];  Location: [place];  Service: Cardiovascular;  Laterality: [name];  . COLONOSCOPY    . COLONOSCOPY WITH PROPOFOL [name] 10/29/2016   Procedure: COLONOSCOPY WITH PROPOFOL;  Surgeon: Doran Stabler, [name];  Location: [facility];  Service: Gastroenterology;  Laterality: [name];  . CORONARY ANGIOPLASTY WITH STENT PLACEMENT  06/02/12   "1; first one ever"  . EP IMPLANTABLE DEVICE [name] 06/17/2016   Procedure: BiV Pacemaker upgrade;  Surgeon: [name], [name];  Location: [place] CV LAB;  Service: Cardiovascular;  Laterality: [name];  . HERNIA REPAIR  1980's  . INGUINAL HERNIA REPAIR  1945   left side  . INSERT / REPLACE / REMOVE PACEMAKER  03/2012   initial placement  . INSERT / REPLACE / REMOVE PACEMAKER  05/25/12   "atrial lead change"  . LEAD REVISION [name] 05/25/2012   Procedure: LEAD REVISION;  Surgeon: Evans Lance, [name];  Location: [facility] CATH LAB;  Service: Cardiovascular;  Laterality: [name];  . PERCUTANEOUS CORONARY STENT INTERVENTION (PCI-S) [name] 06/02/2012   Procedure: PERCUTANEOUS CORONARY STENT INTERVENTION (PCI-S);  Surgeon: Hillary Bow, [name];   Location: [facility] CATH LAB;  Service: Cardiovascular;  Laterality: [name];  . PROSTATECTOMY  12/24/7865   COMPLICATED  . SHOULDER SURGERY     Bilateral; "boney spurs removed"  . SKIN CANCER EXCISION     "in situ; right chest"  . TONSILLECTOMY AND ADENOIDECTOMY     "as a child"    Current Outpatient Prescriptions  Medication Sig Dispense Refill  . acyclovir (ZOVIRAX) 400 MG tablet Take 400 mg by mouth as needed. Herpes flare on legs    . aspirin EC 81 MG tablet Take 81 mg by mouth daily.    . cholecalciferol (VITAMIN D) 1000 UNITS tablet Take 1,000 Units by mouth daily.    . nitroGLYCERIN (NITROSTAT) 0.4 MG SL tablet Place 0.4 mg under the tongue every 5 (five) minutes as needed for chest pain (x 3 tabs).    . propranolol (INDERAL) 10 MG tablet TAKE 1 TABLET BY MOUTH THREE TIMES DAILY AS NEEDED 90 tablet 9  . rivaroxaban (XARELTO) 20 MG TABS tablet TAKE 1 TABLET(20 MG) BY MOUTH DAILY WITH DINNER 30 tablet 5  . vitamin B-12 (CYANOCOBALAMIN) 1000 MCG tablet Take 1,000 mcg by mouth daily.    . sacubitril-valsartan (ENTRESTO) 24-26 MG Take 1 tablet by mouth 2 (two) times daily. 60 tablet 0   No current facility-administered medications for this visit.     Allergies  Allergen Reactions  . Ceclor [Cefaclor] Hives  . Chlorhexidine Itching and Rash  . Statins Other (See Comments)    Muscle weakness  . Ace Inhibitors Other (See Comments)    Does not take due to "angiotensin is responsible for maintaining bladder sphincter tone"    Review of Systems negative except from HPI and PMH  Physical Exam  BP 128/84   Pulse 63   Ht 6' (1.829 m)   Wt 194 lb (88 kg)   SpO2 98%   BMI 26.31 kg/m   Well developed and nourished in no acute distress HENT normal Neck supple with JVP-flat Clear Irregular rate and rhythm, no murmurs or gallops Abd-soft with active BS No Clubbing cyanosis edema Skin-warm and dry A & Oriented  Grossly normal sensory and motor function  ECG demonstrates AV pacing with  occasionalPJCs  Assessment and  Plan  Atrial flutter   Atrial fibrillation s/p ablation [facility] x2  Premature JUNCTIONAL BEATS/Atrial beats  Sinus node dysfunction  Orthostatic Lightheadedness  Pacemaker CRT - Medtronic  The patient's device was interrogated.  The information was reviewed. No changes were made in the programming.    Coronary artery disease s/p  DES stenting 2013>>long term dual therapy recommended per Dr Lilly Cove    The patient  has frequent atrial and junctional ectopy. It is primarily the latter  As such I would presume that it does not have an impact on LV function as opposed to PVCs which are associated with abnormal ventricular activation sequences.  His orthostatic lightheadedness seems worse with the  Introduction of twice a day losartan. Its half-life is 2 hours or so. We discussed alternative ARB's but because of the antiarrhythmic properties associated with Delene Loll, he would like to see how this works. I am not sanguine about his ability to tolerate it from and orthostatic point of view.\  Without symptoms of ischemia  BP well controlled  Recurrent persistent atrial fibrillation episodes lasting one-to 2 months at a time and spontaneously converting.  On Anticoagulation;  No bleeding issues   More than 50% of 45 min was spent in counseling related to the above

## 2017-05-17 ENCOUNTER — Telehealth: Payer: Self-pay | Admitting: Internal Medicine

## 2017-05-17 NOTE — Telephone Encounter (Signed)
New message ° ° ° °Pt is calling asking for a call back. He did not say what it was about.  °

## 2017-05-17 NOTE — Telephone Encounter (Signed)
Spoke with patient who states at last ov Dr. Caryl Comes gave him samples of entresto and gave him a 1 month Rx. He states he is feeling some better and BP is good. He states he may have mild orthostatic lightheadedness but he would like to continue the entresto. He states he will need new Rx for 1 more month to get him to next appointment. I advised that I will forward message to Dr. Caryl Comes for verification prior to reordering. He asks that new Rx be sent to pharmacy on file. He thanked me for the call.

## 2017-05-18 MED ORDER — SACUBITRIL-VALSARTAN 24-26 MG PO TABS: 1.0000 | ORAL_TABLET | Freq: Two times a day (BID) | ORAL | 2 refills | Status: DC

## 2017-05-18 NOTE — Telephone Encounter (Signed)
OK per Dr. Caryl Comes to refill entresto 24/26 mg BID. Refill sent to pharmacy on file.  The patient is aware.

## 2017-05-20 ENCOUNTER — Telehealth: Payer: Self-pay

## 2017-05-20 ENCOUNTER — Telehealth: Payer: Self-pay | Admitting: Internal Medicine

## 2017-05-20 NOTE — Telephone Encounter (Signed)
Prior Auth submitted today- approval denied- to Dr. Caryl Comes to review.

## 2017-05-20 NOTE — Telephone Encounter (Signed)
Reviewed phone message with Dr. Caryl Comes- recommendations received if the patient is not having symptoms with low BP he should continue his medication.  I have notified the patient of the above recommendations. He reports that his SBP was 105 around noon yesterday, around 120 last night, and around 112 this morning. He has not had entresto in about 24 hours, but will resume his dosing. I have advised him to call back should he have any further concerns. He voices understanding.

## 2017-05-20 NOTE — Telephone Encounter (Addendum)
Prior auth for [employer] 24-26 submitted to [facility]. [name] has been denied by [facility]. Appeal forms completed and faxed back to them today. Patient is aware of this.

## 2017-05-20 NOTE — Telephone Encounter (Signed)
New message   [employer] calling to notify that Entresto 26MG  tabs were denied. Faxing over denial.

## 2017-05-20 NOTE — Telephone Encounter (Signed)
New message        Pt c/o BP issue: STAT if pt c/o blurred vision, one-sided weakness or slurred speech  1. What are your last 5 BP readings?  105/65 yesterday 2. Are you having any other symptoms (ex. Dizziness, headache, blurred vision, passed out)?  no 3. What is your BP issue? Calling to ask if he should hold his bp medication for today?   Please call

## 2017-05-24 NOTE — Telephone Encounter (Signed)
**Note De-Identified Salah Nakamura Obfuscation** Returned call to [employer]. All questions concerning DX for [facility] answered.  It is unlikely that Delene Loll will be approved as DX is not an indication for Entresto.  Will forward to Dr Caryl Comes and his nurse.

## 2017-05-24 NOTE — Telephone Encounter (Signed)
Follow up      Calling to get clinical info on entresto.  An appeal was submitted but [employer] need more info.  Please call

## 2017-05-25 NOTE — Telephone Encounter (Signed)
New message     They have clinical question before they can authorize this medication please call this would be the 3rd call

## 2017-05-26 ENCOUNTER — Telehealth: Payer: Self-pay | Admitting: Internal Medicine

## 2017-05-26 NOTE — Telephone Encounter (Signed)
I spoke with the rep from [facility]- gave updated clinical information as requested regarding Entresto.

## 2017-05-26 NOTE — Telephone Encounter (Signed)
New message     Upheld the denial for the [facility] , they are mailing denial letter , you can follow the instructions in the letter to file appeal

## 2017-05-27 NOTE — Telephone Encounter (Signed)
Will await denial letter for appeal process.

## 2017-05-31 ENCOUNTER — Other Ambulatory Visit: Payer: Self-pay | Admitting: Internal Medicine

## 2017-05-31 NOTE — Telephone Encounter (Signed)
Request received Xarelto 20mg ; pt is 71 yrs old, wt-88kg, Crea-1.06 on 06/15/16 & last seen by Dr. Caryl Comes on 04/13/17 and CrCl-77.79ml/min. Will send in a refill however, pt will need labs on done once he comes to see Dr. Caryl Comes on August 16th, 2018.

## 2017-06-02 MED ORDER — LOSARTAN POTASSIUM 25 MG PO TABS: 25.0000 mg | ORAL_TABLET | Freq: Two times a day (BID) | ORAL | 6 refills | Status: DC

## 2017-06-02 NOTE — Telephone Encounter (Signed)
A copy of the denial letter for entresto was received from the patient. Dr. Caryl Comes called and spoke with him this morning- will stop entresto and start losartan 25 mg BID.  RX sent to pharmacy.

## 2017-06-28 ENCOUNTER — Other Ambulatory Visit: Payer: Self-pay | Admitting: Internal Medicine

## 2017-07-01 ENCOUNTER — Ambulatory Visit (INDEPENDENT_AMBULATORY_CARE_PROVIDER_SITE_OTHER): Payer: Medicare Other | Admitting: Internal Medicine

## 2017-07-01 VITALS — BP 140/80 | HR 85 | Ht 72.0 in | Wt 196.0 lb

## 2017-07-01 DIAGNOSIS — I480 Paroxysmal atrial fibrillation: Secondary | ICD-10-CM | POA: Diagnosis not present

## 2017-07-01 DIAGNOSIS — I951 Orthostatic hypotension: Secondary | ICD-10-CM

## 2017-07-01 DIAGNOSIS — I4892 Unspecified atrial flutter: Secondary | ICD-10-CM

## 2017-07-01 DIAGNOSIS — Z950 Presence of cardiac pacemaker: Secondary | ICD-10-CM | POA: Diagnosis not present

## 2017-07-01 DIAGNOSIS — I495 Sick sinus syndrome: Secondary | ICD-10-CM

## 2017-07-01 DIAGNOSIS — I442 Atrioventricular block, complete: Secondary | ICD-10-CM | POA: Diagnosis not present

## 2017-07-01 NOTE — Progress Notes (Signed)
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 He is s/p PVI X 2 at [facility]  he is status post pacemaker for complete heart block. He  is seen now status post CRT P upgrade   He has a history of coronary artery disease with prior stenting; per TDS  It was elected to use long term aspirin and a NOAC and Rivaroxaban was started   In the past angina has been triggered typically by emotional events. This has been stable. He has no exertional chest discomfort. Limiting shortness of breath. He has noticed that when he exerts the heat he can have residual dizziness for a day or 2.  Bladder function stable.  No bleeding. He has recently establish care with the [place]. They have been drawing blood work   > DATE TEST    1/14    ECHO   EF 55-60 %   1/17    MYOVIEW   EF 47 %          No  ischemia  5/17 Echo EF 40-45%   4/18 Echo EF 40-45%    With deteriorating LV function and modest changes in functional status, we undertook CRT upgrade. He had more symptoms after this and it turned out he reverted to sinus rhythm assoc with frequent junctional premature beats comprising 5-10%.  In the interim, he has gone back to Afib . Many fewer ectopics (V sensed 6.1--1.3) April 18 he called in because of diaphragmatic stimulation   This resolved spontaneously Date Cr Hgb  7/17 1.06 14.4          Past Medical History:  Diagnosis Date  . Atrial flutter -atypical 2007   ABLATION x2 [facility]  . Atrial tachycardia ([place]) 2007   ABLATION [facility]  . Benign paroxysmal vertigo   . CAD (coronary artery disease)    a. abnl mv 01/2012;  b. cath 05/23/2012 LCX 80@ bifurcation w/ OM, 95% @ AV groove, otw nonobs dzs, EF 55-65%;  . Carcinoma ([place])    skin cancer "off my chest"  . Familial tremor   . First degree AV block    had 4 ablasions that left py with  heart block  . History of blood transfusion 2010   "2nd day after prostatectomy"  . Hyperlipidemia   . Hypertension   . Incontinence    DECREASING INCONTINENCE RELATED TO PROSTATECTOMY.  . Migraines   . Presence of permanent cardiac pacemaker 2013 ?  Marland Kitchen Prostate cancer ([place])   . Pulsatile tinnitus   . Tachy-brady syndrome ([place])    a. developed after RFCA @ [facility] 2013-> MDT PPM;  b. s/p RA lead revision 05/2012    Past Surgical History:  Procedure Laterality Date  . ATRIAL ABLATION SURGERY  04/01/12   "4th time I had one"  . CARDIAC CATHETERIZATION    . CARDIOVERSION  12/03/2011   Procedure: CARDIOVERSION;  Surgeon: [name], MD;  Location: [place];  Service: Cardiovascular;  Laterality: [name];  . CARDIOVERSION  01/13/2012   Procedure: CARDIOVERSION;  Surgeon: Lelon Perla, MD;  Location: [place];  Service: Cardiovascular;  Laterality: [name];  . CARDIOVERSION  04/27/2012   Procedure: CARDIOVERSION;  Surgeon: Darlin Coco, MD;  Location: [place];  Service: Cardiovascular;  Laterality: [name];  . COLONOSCOPY    .  COLONOSCOPY WITH PROPOFOL [name] 10/29/2016   Procedure: COLONOSCOPY WITH PROPOFOL;  Surgeon: Doran Stabler, MD;  [facility]: [facility];  Service: Gastroenterology;  Laterality: [name];  . CORONARY ANGIOPLASTY WITH STENT PLACEMENT  06/02/12   "1; first one ever"  . EP IMPLANTABLE DEVICE [name] 06/17/2016   Procedure: BiV Pacemaker upgrade;  Surgeon: [name], MD;  Location: [place] CV LAB;  Service: Cardiovascular;  Laterality: [name];  . HERNIA REPAIR  1980's  . INGUINAL HERNIA REPAIR  1945   left side  . INSERT / REPLACE / REMOVE PACEMAKER  03/2012   initial placement  . INSERT / REPLACE / REMOVE PACEMAKER  05/25/12   "atrial lead change"  . LEAD REVISION [name] 05/25/2012   Procedure: LEAD REVISION;  Surgeon: Evans Lance, MD;  Location: [facility] CATH LAB;  Service: Cardiovascular;  Laterality: [name];  . PERCUTANEOUS CORONARY STENT INTERVENTION (PCI-S) [name] 06/02/2012   Procedure:  PERCUTANEOUS CORONARY STENT INTERVENTION (PCI-S);  Surgeon: Hillary Bow, MD;  Location: [facility] CATH LAB;  Service: Cardiovascular;  Laterality: [name];  . PROSTATECTOMY  01/21/8587   COMPLICATED  . SHOULDER SURGERY     Bilateral; "boney spurs removed"  . SKIN CANCER EXCISION     "in situ; right chest"  . TONSILLECTOMY AND ADENOIDECTOMY     "as a child"    Current Outpatient Prescriptions  Medication Sig Dispense Refill  . acyclovir (ZOVIRAX) 400 MG tablet Take 400 mg by mouth as needed. Herpes flare on legs    . aspirin EC 81 MG tablet Take 81 mg by mouth daily.    . cholecalciferol (VITAMIN D) 1000 UNITS tablet Take 1,000 Units by mouth daily.    Marland Kitchen losartan (COZAAR) 25 MG tablet Take 1 tablet (25 mg total) by mouth 2 (two) times daily. 60 tablet 6  . nitroGLYCERIN (NITROSTAT) 0.4 MG SL tablet Place 0.4 mg under the tongue every 5 (five) minutes as needed for chest pain (x 3 tabs).    . propranolol (INDERAL) 10 MG tablet TAKE 1 TABLET BY MOUTH THREE TIMES DAILY AS NEEDED 90 tablet 9  . vitamin B-12 (CYANOCOBALAMIN) 1000 MCG tablet Take 1,000 mcg by mouth daily.    Alveda Reasons 20 MG TABS tablet TAKE 1 TABLET(20 MG) BY MOUTH DAILY WITH DINNER 90 tablet 1   No current facility-administered medications for this visit.     Allergies  Allergen Reactions  . Ceclor [Cefaclor] Hives  . Chlorhexidine Itching and Rash  . Statins Other (See Comments)    Muscle weakness  . Ace Inhibitors Other (See Comments)    Does not take due to "angiotensin is responsible for maintaining bladder sphincter tone"    Review of Systems negative except from HPI and PMH  Physical Exam  BP 140/80   Pulse 85   Ht 6' (1.829 m)   Wt 196 lb (88.9 kg)   SpO2 97%   BMI 26.58 kg/m  Well developed and nourished in no acute distress HENT normal Neck supple with JVP-flat Carotids brisk and full without bruits Clear Regular rate and rhythm, no murmurs or gallops Abd-soft with active BS without hepatomegaly No  Clubbing cyanosis edema Skin-warm and dry A & Oriented  Grossly normal sensory and motor function   ECG demonstrates  ventricular pacing with a negative QRS in lead 1 and upright QRS in lead V1 s  Assessment and  Plan  Atrial flutter/fib  Atrial fibrillation s/p ablation [facility] x2  Premature JUNCTIONAL BEATS/Atrial beats  Sinus node dysfunction  Orthostatic Lightheadedness when he has worsening  Pacemaker CRT - Medtronic  The patient's device was interrogated.  The information was reviewed. No changes were made in the programming.    Coronary artery disease s/p  DES stenting 2013>>long term dual therapy recommended per Dr Lilly Cove    On Anticoagulation;  No bleeding issues   Without symptoms of ischemia / although he has stress induced cehst pain which is stable  BP well controlled  Recurrent persistent atrial fibrillation with elss ectopy  OI mainly related to heat exposure

## 2017-07-01 NOTE — Patient Instructions (Signed)
Medication Instructions: - Your physician recommends that you continue on your current medications as directed. Please refer to the Current Medication list given to you today.  Labwork: - none ordered  Procedures/Testing: - none ordered  Follow-Up: - Remote monitoring is used to monitor your Pacemaker of ICD from home. This monitoring reduces the number of office visits required to check your device to one time per year. It allows [place] to keep an eye on the functioning of your device to ensure it is working properly. You are scheduled for a device check from home on 09/30/17. You may send your transmission at any time that day. If you have a wireless device, the transmission will be sent automatically. After your physician reviews your transmission, you will receive a postcard with your next transmission date.  - Your physician wants you to follow-up in: 6 months with Dr. Caryl Comes. You will receive a reminder letter in the mail two months in advance. If you don't receive a letter, please call our office to schedule the follow-up appointment.   Any Additional Special Instructions Will Be Listed Below (If Applicable).     If you need a refill on your cardiac medications before your next appointment, please call your pharmacy.

## 2017-07-02 ENCOUNTER — Telehealth: Payer: Self-pay | Admitting: Internal Medicine

## 2017-07-02 NOTE — Telephone Encounter (Signed)
New message    Pt is calling to let RN know. Lab work at [place] on 7/20 BUN-17 Cr-1.16 Hemoglobin-14.7 BP today-125/80

## 2017-07-02 NOTE — Telephone Encounter (Signed)
Noted- will forward to Dr. Caryl Comes as an Juluis Rainier.

## 2017-07-03 ENCOUNTER — Encounter: Payer: Self-pay | Admitting: Internal Medicine

## 2017-07-03 NOTE — Progress Notes (Signed)
Lab work at [place] on 7/20 BUN-17 Cr-1.16 Hemoglobin-14.7 BP today-125/80

## 2017-07-05 ENCOUNTER — Telehealth: Payer: Self-pay | Admitting: Cardiology

## 2017-07-05 NOTE — Telephone Encounter (Signed)
Informed [name] the 8/28 appointment had been cancelled and he was scheduled for 09/30/2017.

## 2017-07-05 NOTE — Telephone Encounter (Signed)
New Message:  He wants to know what is his correct date for his remote check please.

## 2017-07-09 LAB — CUP PACEART INCLINIC DEVICE CHECK
Implantable Lead Implant Date: 20130710
Implantable Lead Location: 753860
MDC IDC LEAD IMPLANT DT: 20130517
MDC IDC LEAD IMPLANT DT: 20170802
MDC IDC LEAD LOCATION: 753858
MDC IDC LEAD LOCATION: 753859
MDC IDC PG IMPLANT DT: 20170802
MDC IDC SESS DTM: 20180824150754

## 2017-07-12 ENCOUNTER — Telehealth: Payer: Self-pay | Admitting: Internal Medicine

## 2017-07-12 MED ORDER — ISOSORBIDE MONONITRATE ER 30 MG PO TB24: 30.0000 mg | ORAL_TABLET | Freq: Every day | ORAL | 6 refills | Status: DC

## 2017-07-12 NOTE — Telephone Encounter (Signed)
New message   Pt states he has increased angina. No further details and requests a call back

## 2017-07-12 NOTE — Telephone Encounter (Signed)
Spoke with pt and pt c/o angina off and on for 10 days noted first episode while at gym on treadmill thought maybe pulled a muscle but had 2 other  separate episodes while on treadmill has taken ntg a couple of times with relief.  Discussed with Dr Burt Knack have pt start imdur 30 mg and can see pt today or Thursday the patient called and agrees with plan and will see Dr Burt Knack on Thursday at 8:00 am .Adonis Housekeeper

## 2017-07-13 ENCOUNTER — Telehealth: Payer: Self-pay | Admitting: Cardiovascular Disease

## 2017-07-13 NOTE — Telephone Encounter (Signed)
Yes would cut in half and take 15 mg daily. thx

## 2017-07-13 NOTE — Telephone Encounter (Signed)
New message   Pt states he took isosorbide at 11PM last night and he did not take any blood pressure medicine and this morning his BP is 90/60. He wants to know if he should reduce the dose or not.

## 2017-07-13 NOTE — Telephone Encounter (Signed)
[name] took Imdur 30mg  at 11PM last night.  Skipped Losartan last night because he was concerned what Imdur might do to his BP.  Took BP this morning and it was 90/60.  Didn't take Losartan this AM since BP was down.  Pt slightly lightheaded this AM but not enough to keep him from doing daily routine.  Has not been exerting so not sure if Imdur is helping angina.  Pt wants to know if Imdur should be decreased?  Will route to Dr. Burt Knack.

## 2017-07-14 MED ORDER — ISOSORBIDE MONONITRATE ER 30 MG PO TB24: 15.0000 mg | ORAL_TABLET | Freq: Every day | ORAL | 6 refills | Status: DC

## 2017-07-14 NOTE — Telephone Encounter (Signed)
Called and made [name] aware of Dr. Antionette Char recommendations to decrease the imdur to 15 mg (1/2 tablet) QD. Patient verbalized understanding and thanked me for the call.

## 2017-07-15 ENCOUNTER — Encounter: Payer: Self-pay | Admitting: *Deleted

## 2017-07-15 ENCOUNTER — Encounter: Payer: Self-pay | Admitting: Cardiovascular Disease

## 2017-07-15 ENCOUNTER — Ambulatory Visit (INDEPENDENT_AMBULATORY_CARE_PROVIDER_SITE_OTHER): Payer: Medicare Other | Admitting: Cardiovascular Disease

## 2017-07-15 VITALS — BP 102/70 | HR 74 | Ht 72.0 in | Wt 199.0 lb

## 2017-07-15 DIAGNOSIS — I209 Angina pectoris, unspecified: Secondary | ICD-10-CM | POA: Diagnosis not present

## 2017-07-15 DIAGNOSIS — I25119 Atherosclerotic heart disease of native coronary artery with unspecified angina pectoris: Secondary | ICD-10-CM | POA: Diagnosis not present

## 2017-07-15 LAB — CBC WITH DIFFERENTIAL/PLATELET
BASOS ABS: 0 10*3/uL (ref 0.0–0.2)
BASOS: 1 %
EOS (ABSOLUTE): 0.3 10*3/uL (ref 0.0–0.4)
Eos: 5 %
Hematocrit: 38.3 % (ref 37.5–51.0)
Hemoglobin: 13.5 g/dL (ref 13.0–17.7)
IMMATURE GRANS (ABS): 0 10*3/uL (ref 0.0–0.1)
Immature Granulocytes: 0 %
LYMPHS ABS: 1.4 10*3/uL (ref 0.7–3.1)
LYMPHS: 22 %
MCH: 30.9 pg (ref 26.6–33.0)
MCHC: 35.2 g/dL (ref 31.5–35.7)
MCV: 88 fL (ref 79–97)
MONOS ABS: 0.7 10*3/uL (ref 0.1–0.9)
Monocytes: 11 %
NEUTROS ABS: 3.8 10*3/uL (ref 1.4–7.0)
Neutrophils: 61 %
PLATELETS: 181 10*3/uL (ref 150–379)
RBC: 4.37 x10E6/uL (ref 4.14–5.80)
RDW: 13.6 % (ref 12.3–15.4)
WBC: 6.2 10*3/uL (ref 3.4–10.8)

## 2017-07-15 LAB — BASIC METABOLIC PANEL
BUN / CREAT RATIO: 9 — AB (ref 10–24)
BUN: 11 mg/dL (ref 8–27)
CHLORIDE: 97 mmol/L (ref 96–106)
CO2: 23 mmol/L (ref 20–29)
Calcium: 9.1 mg/dL (ref 8.6–10.2)
Creatinine, Ser: 1.2 mg/dL (ref 0.76–1.27)
GFR calc non Af Amer: 60 mL/min/{1.73_m2} (ref >59)
GFR, EST AFRICAN AMERICAN: 69 mL/min/{1.73_m2} (ref >59)
GLUCOSE: 72 mg/dL (ref 65–99)
POTASSIUM: 4.4 mmol/L (ref 3.5–5.2)
SODIUM: 133 mmol/L — AB (ref 134–144)

## 2017-07-15 LAB — PROTIME-INR
INR: 1.2 (ref 0.8–1.2)
Prothrombin Time: 12.6 s — ABNORMAL HIGH (ref 9.1–12.0)

## 2017-07-15 NOTE — Progress Notes (Signed)
Cardiology Office Note Date:  07/15/2017   ID:  Walter Mt, MD, DOB [birth year], MRN [id number]  PCP:  [name], MD  Cardiologist:  [name], MD    Chief Complaint  Patient presents with  . 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 He underwent cardiac catheterization last in 2013 demonstrating high-grade bifurcational disease involving the left circumflex/OM 1. He was noted to have moderate RCA stenosis and minor nonobstructive LAD stenosis at that time. He underwent PCI of the left circumflex with drug-eluting stent placement into a large obtuse marginal. There was side branch occlusion with collateral filling at the completion of the procedure.  He also has atrial flutter with history of tachybradycardia syndrome and has undergone permanent pacemaker placement. He's undergone catheter ablation procedures as well.   The patient has done well over the last several years with minimal angina.  In the past 2 weeks he has developed exertional angina. He was walking on the treadmill and in the first 10 minutes developed a band-like pressure across the chest turning into a pressure into the center of his chest. He has reproduced this 3 or 4 times with walking since then. Also had symptoms walking up a hill at a slow pace with his dog. Pain does not radiate from the chest. No other exacerbating factors. Symptoms resolve after several minutes of rest.  He denies resting chest pain or pressure. No dyspnea, palpitations, or diaphoresis. His symptoms are like that of past angina. He has had anginal chest pain rarely with emotional stress but has not had exertional chest pain in several years. He called in a few days ago and I recommended isosorbide. He has been unable to tolerate this because of headache and low BP, even at a dose of 15 mg daily.   Past Medical History:  Diagnosis Date  . Atrial flutter -atypical  2007   ABLATION x2 [facility]  . Atrial tachycardia ([place]) 2007   ABLATION [facility]  . Benign paroxysmal vertigo   . CAD (coronary artery disease)    a. abnl mv 01/2012;  b. cath 05/23/2012 LCX 80@ bifurcation w/ OM, 95% @ AV groove, otw nonobs dzs, EF 55-65%;  . Carcinoma ([place])    skin cancer "off my chest"  . Familial tremor   . First degree AV block    had 4 ablasions that left py with heart block  . History of blood transfusion 2010   "2nd day after prostatectomy"  . Hyperlipidemia   . Hypertension   . Incontinence    DECREASING INCONTINENCE RELATED TO PROSTATECTOMY.  . Migraines   . Presence of permanent cardiac pacemaker 2013 ?  Marland Kitchen Prostate cancer ([place])   . Pulsatile tinnitus   . Tachy-brady syndrome ([place])    a. developed after RFCA @ [facility] 2013-> MDT PPM;  b. s/p RA lead revision 05/2012    Past Surgical History:  Procedure Laterality Date  . ATRIAL ABLATION SURGERY  04/01/12   "4th time I had one"  . CARDIAC CATHETERIZATION    . CARDIOVERSION  12/03/2011   Procedure: CARDIOVERSION;  Surgeon: [name], MD;  Location: [facility];  Service: Cardiovascular;  Laterality: [name];  . CARDIOVERSION  01/13/2012   Procedure: CARDIOVERSION;  Surgeon: Lelon Perla, MD;  Location: [facility];  Service: Cardiovascular;  Laterality: [name];  . CARDIOVERSION  04/27/2012   Procedure: CARDIOVERSION;  Surgeon: Darlin Coco,  MD;  Location: [facility];  Service: Cardiovascular;  Laterality: [name];  . COLONOSCOPY    . COLONOSCOPY WITH PROPOFOL [name] 10/29/2016   Procedure: COLONOSCOPY WITH PROPOFOL;  Surgeon: Doran Stabler, MD;  Location: [facility];  Service: Gastroenterology;  Laterality: [name];  . CORONARY ANGIOPLASTY WITH STENT PLACEMENT  06/02/12   "1; first one ever"  . EP IMPLANTABLE DEVICE [name] 06/17/2016   Procedure: BiV Pacemaker upgrade;  Surgeon: [name], MD;  Location: [facility];  Service: Cardiovascular;  Laterality: [name];  . HERNIA REPAIR  1980's  . INGUINAL HERNIA REPAIR  1945   left  side  . INSERT / REPLACE / REMOVE PACEMAKER  03/2012   initial placement  . INSERT / REPLACE / REMOVE PACEMAKER  05/25/12   "atrial lead change"  . LEAD REVISION [name] 05/25/2012   Procedure: LEAD REVISION;  Surgeon: Evans Lance, MD;  Location: [facility] CATH LAB;  Service: Cardiovascular;  Laterality: [name];  . PERCUTANEOUS CORONARY STENT INTERVENTION (PCI-S) [name] 06/02/2012   Procedure: PERCUTANEOUS CORONARY STENT INTERVENTION (PCI-S);  Surgeon: Hillary Bow, MD;  Location: [facility] CATH LAB;  Service: Cardiovascular;  Laterality: [name];  . PROSTATECTOMY  03/23/997   COMPLICATED  . SHOULDER SURGERY     Bilateral; "boney spurs removed"  . SKIN CANCER EXCISION     "in situ; right chest"  . TONSILLECTOMY AND ADENOIDECTOMY     "as a child"    Current Outpatient Prescriptions  Medication Sig Dispense Refill  . acyclovir (ZOVIRAX) 400 MG tablet Take 400 mg by mouth as needed. Herpes flare on legs    . aspirin EC 81 MG tablet Take 81 mg by mouth daily.    . cholecalciferol (VITAMIN D) 1000 UNITS tablet Take 1,000 Units by mouth daily.    . isosorbide dinitrate (ISORDIL) 30 MG tablet Take 15 mg by mouth daily.    Marland Kitchen losartan (COZAAR) 25 MG tablet Take 1 tablet (25 mg total) by mouth 2 (two) times daily. 60 tablet 6  . nitroGLYCERIN (NITROSTAT) 0.4 MG SL tablet Place 0.4 mg under the tongue every 5 (five) minutes as needed for chest pain (x 3 tabs).    . propranolol (INDERAL) 10 MG tablet Take 10 mg by mouth 3 (three) times daily. Palpitations or benign central tremor    . vitamin B-12 (CYANOCOBALAMIN) 1000 MCG tablet Take 1,000 mcg by mouth daily.    Alveda Reasons 20 MG TABS tablet TAKE 1 TABLET(20 MG) BY MOUTH DAILY WITH DINNER 90 tablet 1   No current facility-administered medications for this visit.     Allergies:   Ceclor [cefaclor]; Chlorhexidine; Statins; and Ace inhibitors   Social History:  The patient  reports that he has never smoked. He has never used smokeless tobacco. He reports that he drinks  about 1.5 oz of alcohol per week . He reports that he does not use drugs.   Family History:  The patient's  family history includes Colon cancer in his sister; Diabetes in his brother; Heart disease in his brother and father.    ROS:  Please see the history of present illness.  Otherwise, review of systems is positive for dizziness and palpitations.  All other systems are reviewed and negative.    PHYSICAL EXAM: VS:  BP 102/70   Pulse 74   Ht 6' (1.829 m)   Wt 199 lb (90.3 kg)   BMI 26.99 kg/m  , BMI Body mass index is 26.99 kg/m. GEN: Well nourished, well developed,  in no acute distress  HEENT: normal  Neck: no JVD, no masses. No carotid bruits Cardiac: RRR without murmur or gallop                Respiratory:  clear to auscultation bilaterally, normal work of breathing GI: soft, nontender, nondistended, + BS MS: no deformity or atrophy  Ext: no pretibial edema, pedal pulses 2+= bilaterally Skin: warm and dry, no rash Neuro:  Strength and sensation are intact Psych: euthymic mood, full affect  EKG:  EKG is ordered today. The ekg ordered today shows A-sensed V-paced rhythm 74 bpm  Recent Labs: No results found for requested labs within last 8760 hours.   Lipid Panel  No results found for: CHOL, TRIG, HDL, CHOLHDL, VLDL, LDLCALC, LDLDIRECT    Wt Readings from Last 3 Encounters:  07/15/17 199 lb (90.3 kg)  07/01/17 196 lb (88.9 kg)  04/13/17 194 lb (88 kg)     Cardiac Studies Reviewed: Cath 05/23/2012: Hemodynamics:  AO 116/72 (91) LV 106/10              Coronary angiography: Coronary dominance: right  Left mainstem: Mild ostial tapering without obstruction  Left anterior descending (LAD): Mild luminal irregularity with segmental plaque of 50% or less near the proximal mid junction.  The distal vessel is smaller  Left circumflex (LCx): Provides a very large OM and then modest size AV circumflex.  The circumflex has an 80% leading into and crossing the  bifurcation and the AV circumflex has a 95% eccentric proximal stenosis.    Right coronary artery (RCA): large caliber vessel with heavily calcified 60% eccentric plaque in the distal vessel.    Left ventriculography: Left ventricular systolic function is normal, LVEF is estimated at 55-65%, there is no significant mitral regurgitation   Final Conclusions:   1.  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  2.  Moderate stenosis of the RCA 3.  Mild diffuse plaque of the LAD 4.  Recurrent atrial fibrillation unresponsive to ablation and AA therapy 5.  Possible lead dislodgment of the atrial lead post pacer implant.    Recommendations:  1.  Have reviewed with Dr. Caryl Comes and Angelena Form, and will review with Dr. Floyde Parkins today for the team approach to decision making 2.  He will likely need lead revision.    Cath 02/23/2017: Study Conclusions  - Left ventricle: The cavity size was normal. Wall thickness was   increased in a pattern of mild LVH. Systolic function was mildly   to moderately reduced. The estimated ejection fraction was in the   range of 40% to 45%. Wall motion was normal; there were no   regional wall motion abnormalities. The study is not technically   sufficient to allow evaluation of LV diastolic function. - Aortic valve: There was trivial regurgitation. - Aorta: Aortic root dimension: 39 mm (ED). - Aortic root: The aortic root is mildly dilated at the sinotubular   junction. - Mitral valve: Mildly thickened leaflets . There was mild   regurgitation. - Left atrium: The atrium was moderately dilated. - Right ventricle: The cavity size was mildly dilated. Pacer wire   or catheter noted in right ventricle. Systolic function was   normal. - Right atrium: Severely dilated. Pacer wire or catheter noted in   right atrium. - Tricuspid valve: There was mild regurgitation. - Pulmonic valve: The valve appears to be  grossly normal. There was   mild regurgitation. -  Pulmonary arteries: PA peak pressure: 35 mm Hg (S). - Systemic veins: The IVC measures <2.1 cm, but does not collapse   >50%, suggesting an elevated RA pressure of 8 mmHg.  Impressions:  - Compared to a prior study in 2017, the LVEF has improved from   35-40% up to 40-45%.  ASSESSMENT AND PLAN: 1.  CAD, native vessel, with angina: the patient has new onset CCS II angina unable to tolerate any further medical therapy. With classic symptoms of angina of new onset (unstable angina) I have recommended cardiac catheterization and possible PCI. I have reviewed the risks, indications, and alternatives to cardiac catheterization, possible angioplasty, and stenting with the patient. Risks include but are not limited to bleeding, infection, vascular injury, stroke, myocardial infection, arrhythmia, kidney injury, radiation-related injury in the case of prolonged fluoroscopy use, emergency cardiac surgery, and death. The patient understands the risks of serious complication is 1-2 in 6812 with diagnostic cardiac cath and 1-2% or less with angioplasty/stenting.   The patient is anticoagulated with Xarelto and will hold this the day prior to cath and day of the procedure. Will determine best antithrombotic strategy post-cath depending on findings. He will otherwise continue current medicines.  2. Atrial fibrillation/flutter: followed closely by Dr Caryl Comes. In sinus today with atrial sensed, v-paced rhythm. Anticoagulated with Xarelto.   3. Lipids: statin intolerant. Will explore PCSK9 inhibitors as an option for him.   Current medicines are reviewed with the patient today.  The patient does not have concerns regarding medicines.  Labs/ tests ordered today include:  No orders of the defined types were placed in this encounter.   Disposition:   FU Bryn Gulling, MD  07/15/2017 8:24 AM    [facility] [address], [place], [place]  [address] Phone: [phone]; Fax: [phone]

## 2017-07-15 NOTE — Patient Instructions (Addendum)
Medication Instructions:  Your physician recommends that you continue on your current medications as directed. Please refer to the Current Medication list given to you today.   Labwork: Lab work to be done today--BMP, CBC, PT  Testing/Procedures: Your physician has requested that you have a cardiac catheterization. Cardiac catheterization is used to diagnose and/or treat various heart conditions. Doctors may recommend this procedure for a number of different reasons. The most common reason is to evaluate chest pain. Chest pain can be a symptom of coronary artery disease (CAD), and cardiac catheterization can show whether plaque is narrowing or blocking your heart's arteries. This procedure is also used to evaluate the valves, as well as measure the blood flow and oxygen levels in different parts of your heart. For further information please visit [email or web address]. Please follow instruction sheet, as given.  Scheduled for September 5,2018  Follow-Up: Your physician recommends that you schedule a follow-up appointment in: one month with PA or NP    Any Other Special Instructions Will Be Listed Below (If Applicable).     If you need a refill on your cardiac medications before your next appointment, please call your pharmacy.

## 2017-07-20 ENCOUNTER — Telehealth: Payer: Self-pay

## 2017-07-20 NOTE — Telephone Encounter (Signed)
Patient contacted pre-catheterization at [facility] scheduled for:  07/21/2017 @ 1430 Verified arrival time and place:  [facility] @ 1230 Confirmed AM meds to be taken pre-cath with sip of water: Take ASA Hold Xarelto-last dose Monday evening-confirmed Confirmed patient has responsible person to drive home post procedure and observe patient for 24 hours:  yes Addl concerns:  None noted

## 2017-07-21 ENCOUNTER — Ambulatory Visit (HOSPITAL_COMMUNITY)
Admission: RE | Admit: 2017-07-21 | Discharge: 2017-07-22 | Disposition: A | Discharge status: Home / Self Care | Payer: Medicare Other | Source: Ambulatory Visit | Attending: Cardiovascular Disease | Admitting: Cardiovascular Disease

## 2017-07-21 ENCOUNTER — Encounter (HOSPITAL_COMMUNITY)
Admission: RE | Disposition: A | Discharge status: Home / Self Care | Payer: Self-pay | Source: Ambulatory Visit | Attending: Cardiovascular Disease

## 2017-07-21 DIAGNOSIS — I4891 Unspecified atrial fibrillation: Secondary | ICD-10-CM | POA: Insufficient documentation

## 2017-07-21 DIAGNOSIS — I25118 Atherosclerotic heart disease of native coronary artery with other forms of angina pectoris: Secondary | ICD-10-CM | POA: Insufficient documentation

## 2017-07-21 DIAGNOSIS — Z955 Presence of coronary angioplasty implant and graft: Secondary | ICD-10-CM

## 2017-07-21 DIAGNOSIS — Z950 Presence of cardiac pacemaker: Secondary | ICD-10-CM | POA: Diagnosis not present

## 2017-07-21 DIAGNOSIS — Z7982 Long term (current) use of aspirin: Secondary | ICD-10-CM | POA: Diagnosis not present

## 2017-07-21 DIAGNOSIS — E785 Hyperlipidemia, unspecified: Secondary | ICD-10-CM | POA: Diagnosis not present

## 2017-07-21 DIAGNOSIS — G250 Essential tremor: Secondary | ICD-10-CM | POA: Insufficient documentation

## 2017-07-21 DIAGNOSIS — I10 Essential (primary) hypertension: Secondary | ICD-10-CM | POA: Insufficient documentation

## 2017-07-21 DIAGNOSIS — Z7901 Long term (current) use of anticoagulants: Secondary | ICD-10-CM | POA: Diagnosis not present

## 2017-07-21 DIAGNOSIS — I4892 Unspecified atrial flutter: Secondary | ICD-10-CM | POA: Insufficient documentation

## 2017-07-21 DIAGNOSIS — I440 Atrioventricular block, first degree: Secondary | ICD-10-CM | POA: Diagnosis not present

## 2017-07-21 DIAGNOSIS — I2510 Atherosclerotic heart disease of native coronary artery without angina pectoris: Secondary | ICD-10-CM | POA: Diagnosis present

## 2017-07-21 HISTORY — PX: LEFT HEART CATH AND CORONARY ANGIOGRAPHY: CATH118249

## 2017-07-21 HISTORY — PX: CORONARY STENT INTERVENTION: CATH118234

## 2017-07-21 LAB — POCT ACTIVATED CLOTTING TIME
ACTIVATED CLOTTING TIME: 274 s
Activated Clotting Time: 241 seconds

## 2017-07-21 SURGERY — LEFT HEART CATH AND CORONARY ANGIOGRAPHY
Anesthesia: LOCAL

## 2017-07-21 MED ORDER — CLOPIDOGREL BISULFATE 300 MG PO TABS
ORAL_TABLET | ORAL | Status: AC
  Filled 2017-07-21: qty 1

## 2017-07-21 MED ORDER — SODIUM CHLORIDE 0.9% FLUSH: 3.0000 mL | INTRAVENOUS | Status: DC | PRN

## 2017-07-21 MED ORDER — DIAZEPAM 5 MG PO TABS
5.0000 mg | ORAL_TABLET | Freq: Four times a day (QID) | ORAL | Status: DC | PRN
Start: 2017-07-21 — End: 2017-07-22

## 2017-07-21 MED ORDER — ONDANSETRON HCL 4 MG/2ML IJ SOLN: 4.0000 mg | Freq: Four times a day (QID) | INTRAMUSCULAR | Status: DC | PRN

## 2017-07-21 MED ORDER — MIDAZOLAM HCL 2 MG/2ML IJ SOLN
INTRAMUSCULAR | Status: AC
  Filled 2017-07-21: qty 2

## 2017-07-21 MED ORDER — LIDOCAINE HCL (PF) 1 % IJ SOLN
INTRAMUSCULAR | Status: AC
  Filled 2017-07-21: qty 30

## 2017-07-21 MED ORDER — SODIUM CHLORIDE 0.9 % IV SOLN: 250.0000 mL | INTRAVENOUS | Status: DC | PRN

## 2017-07-21 MED ORDER — HEPARIN SODIUM (PORCINE) 1000 UNIT/ML IJ SOLN
INTRAMUSCULAR | Status: AC
  Filled 2017-07-21: qty 1

## 2017-07-21 MED ORDER — SODIUM CHLORIDE 0.9 % WEIGHT BASED INFUSION
3.0000 mL/kg/h | INTRAVENOUS | Status: DC
  Administered 2017-07-21: 3 mL/kg/h via INTRAVENOUS

## 2017-07-21 MED ORDER — LIDOCAINE HCL (PF) 1 % IJ SOLN
INTRAMUSCULAR | Status: DC | PRN
  Administered 2017-07-21: 2 mL

## 2017-07-21 MED ORDER — CLOPIDOGREL BISULFATE 75 MG PO TABS
75.0000 mg | ORAL_TABLET | Freq: Every day | ORAL | Status: DC
  Administered 2017-07-22: 75 mg via ORAL
  Filled 2017-07-21: qty 1

## 2017-07-21 MED ORDER — FENTANYL CITRATE (PF) 100 MCG/2ML IJ SOLN
INTRAMUSCULAR | Status: AC
  Filled 2017-07-21: qty 2

## 2017-07-21 MED ORDER — IOPAMIDOL (ISOVUE-370) INJECTION 76%
INTRAVENOUS | Status: DC | PRN
  Administered 2017-07-21: 150 mL via INTRA_ARTERIAL

## 2017-07-21 MED ORDER — MIDAZOLAM HCL 2 MG/2ML IJ SOLN
INTRAMUSCULAR | Status: DC | PRN
  Administered 2017-07-21: 2 mg via INTRAVENOUS

## 2017-07-21 MED ORDER — HYDRALAZINE HCL 20 MG/ML IJ SOLN: 5.0000 mg | INTRAMUSCULAR | Status: AC | PRN

## 2017-07-21 MED ORDER — OXYCODONE HCL 5 MG PO TABS: 5.0000 mg | ORAL_TABLET | ORAL | Status: DC | PRN

## 2017-07-21 MED ORDER — SODIUM CHLORIDE 0.9% FLUSH: 3.0000 mL | Freq: Two times a day (BID) | INTRAVENOUS | Status: DC

## 2017-07-21 MED ORDER — HEPARIN SODIUM (PORCINE) 1000 UNIT/ML IJ SOLN
INTRAMUSCULAR | Status: DC | PRN
  Administered 2017-07-21 (×2): 4000 [IU] via INTRAVENOUS
  Administered 2017-07-21: 5000 [IU] via INTRAVENOUS

## 2017-07-21 MED ORDER — FENTANYL CITRATE (PF) 100 MCG/2ML IJ SOLN
INTRAMUSCULAR | Status: DC | PRN
  Administered 2017-07-21: 25 ug via INTRAVENOUS

## 2017-07-21 MED ORDER — NITROGLYCERIN 1 MG/10 ML FOR IR/CATH LAB
INTRA_ARTERIAL | Status: DC | PRN
  Administered 2017-07-21: 200 ug via INTRACORONARY

## 2017-07-21 MED ORDER — LABETALOL HCL 5 MG/ML IV SOLN: 10.0000 mg | INTRAVENOUS | Status: AC | PRN

## 2017-07-21 MED ORDER — IOPAMIDOL (ISOVUE-370) INJECTION 76%
INTRAVENOUS | Status: AC
  Filled 2017-07-21: qty 50

## 2017-07-21 MED ORDER — IOPAMIDOL (ISOVUE-370) INJECTION 76%
INTRAVENOUS | Status: AC
  Filled 2017-07-21: qty 100

## 2017-07-21 MED ORDER — PROPRANOLOL HCL 10 MG PO TABS
10.0000 mg | ORAL_TABLET | Freq: Three times a day (TID) | ORAL | Status: DC | PRN
  Filled 2017-07-21: qty 1

## 2017-07-21 MED ORDER — NITROGLYCERIN 1 MG/10 ML FOR IR/CATH LAB
INTRA_ARTERIAL | Status: AC
  Filled 2017-07-21: qty 10

## 2017-07-21 MED ORDER — ASPIRIN 81 MG PO CHEW: 81.0000 mg | CHEWABLE_TABLET | ORAL | Status: DC

## 2017-07-21 MED ORDER — SODIUM CHLORIDE 0.9 % WEIGHT BASED INFUSION: 1.0000 mL/kg/h | INTRAVENOUS | Status: DC

## 2017-07-21 MED ORDER — VERAPAMIL HCL 2.5 MG/ML IV SOLN
INTRAVENOUS | Status: DC | PRN
  Administered 2017-07-21: 10 mL via INTRA_ARTERIAL

## 2017-07-21 MED ORDER — HEPARIN (PORCINE) IN NACL 2-0.9 UNIT/ML-% IJ SOLN
INTRAMUSCULAR | Status: AC
  Filled 2017-07-21: qty 1000

## 2017-07-21 MED ORDER — CLOPIDOGREL BISULFATE 300 MG PO TABS
ORAL_TABLET | ORAL | Status: DC | PRN
  Administered 2017-07-21: 600 mg via ORAL

## 2017-07-21 MED ORDER — LOSARTAN POTASSIUM 50 MG PO TABS
25.0000 mg | ORAL_TABLET | Freq: Two times a day (BID) | ORAL | Status: DC
  Administered 2017-07-21 – 2017-07-22 (×2): 25 mg via ORAL
  Filled 2017-07-21: qty 1
  Filled 2017-07-21: qty 0.5

## 2017-07-21 MED ORDER — MORPHINE SULFATE (PF) 4 MG/ML IV SOLN
2.0000 mg | INTRAVENOUS | Status: DC | PRN
Start: 2017-07-21 — End: 2017-07-22

## 2017-07-21 MED ORDER — VERAPAMIL HCL 2.5 MG/ML IV SOLN
INTRAVENOUS | Status: AC
  Filled 2017-07-21: qty 2

## 2017-07-21 MED ORDER — SODIUM CHLORIDE 0.9 % WEIGHT BASED INFUSION: 1.0000 mL/kg/h | INTRAVENOUS | Status: AC

## 2017-07-21 MED ORDER — HEPARIN (PORCINE) IN NACL 2-0.9 UNIT/ML-% IJ SOLN
INTRAMUSCULAR | Status: AC | PRN
  Administered 2017-07-21: 1000 mL

## 2017-07-21 MED ORDER — ACETAMINOPHEN 325 MG PO TABS: 650.0000 mg | ORAL_TABLET | ORAL | Status: DC | PRN

## 2017-07-21 MED ORDER — ANGIOPLASTY BOOK
Freq: Once | Status: AC
  Administered 2017-07-21: 22:00:00
  Filled 2017-07-21: qty 1

## 2017-07-21 MED ORDER — ASPIRIN EC 81 MG PO TBEC
81.0000 mg | DELAYED_RELEASE_TABLET | Freq: Every day | ORAL | Status: DC
  Administered 2017-07-22: 81 mg via ORAL
  Filled 2017-07-21: qty 1

## 2017-07-21 MED ORDER — RIVAROXABAN 20 MG PO TABS
20.0000 mg | ORAL_TABLET | Freq: Every day | ORAL | Status: DC
  Administered 2017-07-22: 10:00:00 20 mg via ORAL
  Filled 2017-07-21: qty 1

## 2017-07-21 SURGICAL SUPPLY — 19 items
BALLN EUPHORA RX 2.5X12 (BALLOONS) ×2
BALLN SAPPHIRE ~~LOC~~ 3.75X12 (BALLOONS) ×1 IMPLANT
BALLOON EUPHORA RX 2.5X12 (BALLOONS) IMPLANT
CATH IMPULSE 5F ANG/FL3.5 (CATHETERS) ×1 IMPLANT
CATH LAUNCHER 6FR EBU 3.75 (CATHETERS) ×1 IMPLANT
CATH VISTA GUIDE 6FR JL3.5 (CATHETERS) ×1 IMPLANT
CATH VISTA GUIDE 6FR JL4 (CATHETERS) ×1 IMPLANT
DEVICE RAD COMP TR BAND LRG (VASCULAR PRODUCTS) ×1 IMPLANT
GLIDESHEATH SLEND SS 6F .021 (SHEATH) ×1 IMPLANT
GUIDEWIRE INQWIRE 1.5J.035X260 (WIRE) IMPLANT
INQWIRE 1.5J .035X260CM (WIRE) ×2
KIT ENCORE 26 ADVANTAGE (KITS) ×1 IMPLANT
KIT HEART LEFT (KITS) ×2 IMPLANT
PACK CARDIAC CATHETERIZATION (CUSTOM PROCEDURE TRAY) ×2 IMPLANT
STENT SIERRA 3.50 X 15 MM (Permanent Stent) ×1 IMPLANT
SYR MEDRAD MARK V 150ML (SYRINGE) ×2 IMPLANT
TRANSDUCER W/STOPCOCK (MISCELLANEOUS) ×2 IMPLANT
TUBING CIL FLEX 10 FLL-RA (TUBING) ×2 IMPLANT
WIRE COUGAR XT STRL 190CM (WIRE) ×2 IMPLANT

## 2017-07-21 NOTE — H&P (View-Only) (Signed)
Cardiology Office Note Date:  07/15/2017   ID:  Walter Mt, MD, DOB [birth year], MRN [id number]  PCP:  [name], MD  Cardiologist:  [name], MD    Chief Complaint  Patient presents with  . 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 He underwent cardiac catheterization last in 2013 demonstrating high-grade bifurcational disease involving the left circumflex/OM 1. He was noted to have moderate RCA stenosis and minor nonobstructive LAD stenosis at that time. He underwent PCI of the left circumflex with drug-eluting stent placement into a large obtuse marginal. There was side branch occlusion with collateral filling at the completion of the procedure.  He also has atrial flutter with history of tachybradycardia syndrome and has undergone permanent pacemaker placement. He's undergone catheter ablation procedures as well.   The patient has done well over the last several years with minimal angina.  In the past 2 weeks he has developed exertional angina. He was walking on the treadmill and in the first 10 minutes developed a band-like pressure across the chest turning into a pressure into the center of his chest. He has reproduced this 3 or 4 times with walking since then. Also had symptoms walking up a hill at a slow pace with his dog. Pain does not radiate from the chest. No other exacerbating factors. Symptoms resolve after several minutes of rest.  He denies resting chest pain or pressure. No dyspnea, palpitations, or diaphoresis. His symptoms are like that of past angina. He has had anginal chest pain rarely with emotional stress but has not had exertional chest pain in several years. He called in a few days ago and I recommended isosorbide. He has been unable to tolerate this because of headache and low BP, even at a dose of 15 mg daily.   Past Medical History:  Diagnosis Date  . Atrial flutter -atypical  2007   ABLATION x2 [facility]  . Atrial tachycardia ([place]) 2007   ABLATION [facility]  . Benign paroxysmal vertigo   . CAD (coronary artery disease)    a. abnl mv 01/2012;  b. cath 05/23/2012 LCX 80@ bifurcation w/ OM, 95% @ AV groove, otw nonobs dzs, EF 55-65%;  . Carcinoma ([place])    skin cancer "off my chest"  . Familial tremor   . First degree AV block    had 4 ablasions that left py with heart block  . History of blood transfusion 2010   "2nd day after prostatectomy"  . Hyperlipidemia   . Hypertension   . Incontinence    DECREASING INCONTINENCE RELATED TO PROSTATECTOMY.  . Migraines   . Presence of permanent cardiac pacemaker 2013 ?  Marland Kitchen Prostate cancer ([place])   . Pulsatile tinnitus   . Tachy-brady syndrome ([place])    a. developed after RFCA @ [facility] 2013-> MDT PPM;  b. s/p RA lead revision 05/2012    Past Surgical History:  Procedure Laterality Date  . ATRIAL ABLATION SURGERY  04/01/12   "4th time I had one"  . CARDIAC CATHETERIZATION    . CARDIOVERSION  12/03/2011   Procedure: CARDIOVERSION;  Surgeon: [name], MD;  Location: [place];  Service: Cardiovascular;  Laterality: [name];  . CARDIOVERSION  01/13/2012   Procedure: CARDIOVERSION;  Surgeon: Lelon Perla, MD;  Location: [place];  Service: Cardiovascular;  Laterality: [name];  . CARDIOVERSION  04/27/2012   Procedure: CARDIOVERSION;  Surgeon: Darlin Coco,  MD;  Location: [facility];  Service: Cardiovascular;  Laterality: [name];  . COLONOSCOPY    . COLONOSCOPY WITH PROPOFOL [name] 10/29/2016   Procedure: COLONOSCOPY WITH PROPOFOL;  Surgeon: Doran Stabler, MD;  Location: [facility];  Service: Gastroenterology;  Laterality: [name];  . CORONARY ANGIOPLASTY WITH STENT PLACEMENT  06/02/12   "1; first one ever"  . EP IMPLANTABLE DEVICE [name] 06/17/2016   Procedure: BiV Pacemaker upgrade;  Surgeon: [name], MD;  Location: [facility];  Service: Cardiovascular;  Laterality: [name];  . HERNIA REPAIR  1980's  . INGUINAL HERNIA REPAIR  1945   left  side  . INSERT / REPLACE / REMOVE PACEMAKER  03/2012   initial placement  . INSERT / REPLACE / REMOVE PACEMAKER  05/25/12   "atrial lead change"  . LEAD REVISION [name] 05/25/2012   Procedure: LEAD REVISION;  Surgeon: Evans Lance, MD;  Location: [facility];  Service: Cardiovascular;  Laterality: [name];  . PERCUTANEOUS CORONARY STENT INTERVENTION (PCI-S) [name] 06/02/2012   Procedure: PERCUTANEOUS CORONARY STENT INTERVENTION (PCI-S);  Surgeon: Hillary Bow, MD;  Location: [facility];  Service: Cardiovascular;  Laterality: [name];  . PROSTATECTOMY  04/18/9527   COMPLICATED  . SHOULDER SURGERY     Bilateral; "boney spurs removed"  . SKIN CANCER EXCISION     "in situ; right chest"  . TONSILLECTOMY AND ADENOIDECTOMY     "as a child"    Current Outpatient Prescriptions  Medication Sig Dispense Refill  . acyclovir (ZOVIRAX) 400 MG tablet Take 400 mg by mouth as needed. Herpes flare on legs    . aspirin EC 81 MG tablet Take 81 mg by mouth daily.    . cholecalciferol (VITAMIN D) 1000 UNITS tablet Take 1,000 Units by mouth daily.    . isosorbide dinitrate (ISORDIL) 30 MG tablet Take 15 mg by mouth daily.    Marland Kitchen losartan (COZAAR) 25 MG tablet Take 1 tablet (25 mg total) by mouth 2 (two) times daily. 60 tablet 6  . nitroGLYCERIN (NITROSTAT) 0.4 MG SL tablet Place 0.4 mg under the tongue every 5 (five) minutes as needed for chest pain (x 3 tabs).    . propranolol (INDERAL) 10 MG tablet Take 10 mg by mouth 3 (three) times daily. Palpitations or benign central tremor    . vitamin B-12 (CYANOCOBALAMIN) 1000 MCG tablet Take 1,000 mcg by mouth daily.    Alveda Reasons 20 MG TABS tablet TAKE 1 TABLET(20 MG) BY MOUTH DAILY WITH DINNER 90 tablet 1   No current facility-administered medications for this visit.     Allergies:   Ceclor [cefaclor]; Chlorhexidine; Statins; and Ace inhibitors   Social History:  The patient  reports that he has never smoked. He has never used smokeless tobacco. He reports that he drinks  about 1.5 oz of alcohol per week . He reports that he does not use drugs.   Family History:  The patient's  family history includes Colon cancer in his sister; Diabetes in his brother; Heart disease in his brother and father.    ROS:  Please see the history of present illness.  Otherwise, review of systems is positive for dizziness and palpitations.  All other systems are reviewed and negative.    PHYSICAL EXAM: VS:  BP 102/70   Pulse 74   Ht 6' (1.829 m)   Wt 199 lb (90.3 kg)   BMI 26.99 kg/m  , BMI Body mass index is 26.99 kg/m. GEN: Well nourished, well developed,  in no acute distress  HEENT: normal  Neck: no JVD, no masses. No carotid bruits Cardiac: RRR without murmur or gallop                Respiratory:  clear to auscultation bilaterally, normal work of breathing GI: soft, nontender, nondistended, + BS MS: no deformity or atrophy  Ext: no pretibial edema, pedal pulses 2+= bilaterally Skin: warm and dry, no rash Neuro:  Strength and sensation are intact Psych: euthymic mood, full affect  EKG:  EKG is ordered today. The ekg ordered today shows A-sensed V-paced rhythm 74 bpm  Recent Labs: No results found for requested labs within last 8760 hours.   Lipid Panel  No results found for: CHOL, TRIG, HDL, CHOLHDL, VLDL, LDLCALC, LDLDIRECT    Wt Readings from Last 3 Encounters:  07/15/17 199 lb (90.3 kg)  07/01/17 196 lb (88.9 kg)  04/13/17 194 lb (88 kg)     Cardiac Studies Reviewed: Cath 05/23/2012: Hemodynamics:  AO 116/72 (91) LV 106/10              Coronary angiography: Coronary dominance: right  Left mainstem: Mild ostial tapering without obstruction  Left anterior descending (LAD): Mild luminal irregularity with segmental plaque of 50% or less near the proximal mid junction.  The distal vessel is smaller  Left circumflex (LCx): Provides a very large OM and then modest size AV circumflex.  The circumflex has an 80% leading into and crossing the  bifurcation and the AV circumflex has a 95% eccentric proximal stenosis.    Right coronary artery (RCA): large caliber vessel with heavily calcified 60% eccentric plaque in the distal vessel.    Left ventriculography: Left ventricular systolic function is normal, LVEF is estimated at 55-65%, there is no significant mitral regurgitation   Final Conclusions:   1.  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  2.  Moderate stenosis of the RCA 3.  Mild diffuse plaque of the LAD 4.  Recurrent atrial fibrillation unresponsive to ablation and AA therapy 5.  Possible lead dislodgment of the atrial lead post pacer implant.    Recommendations:  1.  Have reviewed with Dr. Caryl Comes and Angelena Form, and will review with Dr. Floyde Parkins today for the team approach to decision making 2.  He will likely need lead revision.    Cath 02/23/2017: Study Conclusions  - Left ventricle: The cavity size was normal. Wall thickness was   increased in a pattern of mild LVH. Systolic function was mildly   to moderately reduced. The estimated ejection fraction was in the   range of 40% to 45%. Wall motion was normal; there were no   regional wall motion abnormalities. The study is not technically   sufficient to allow evaluation of LV diastolic function. - Aortic valve: There was trivial regurgitation. - Aorta: Aortic root dimension: 39 mm (ED). - Aortic root: The aortic root is mildly dilated at the sinotubular   junction. - Mitral valve: Mildly thickened leaflets . There was mild   regurgitation. - Left atrium: The atrium was moderately dilated. - Right ventricle: The cavity size was mildly dilated. Pacer wire   or catheter noted in right ventricle. Systolic function was   normal. - Right atrium: Severely dilated. Pacer wire or catheter noted in   right atrium. - Tricuspid valve: There was mild regurgitation. - Pulmonic valve: The valve appears to be  grossly normal. There was   mild regurgitation. -  Pulmonary arteries: PA peak pressure: 35 mm Hg (S). - Systemic veins: The IVC measures <2.1 cm, but does not collapse   >50%, suggesting an elevated RA pressure of 8 mmHg.  Impressions:  - Compared to a prior study in 2017, the LVEF has improved from   35-40% up to 40-45%.  ASSESSMENT AND PLAN: 1.  CAD, native vessel, with angina: the patient has new onset CCS II angina unable to tolerate any further medical therapy. With classic symptoms of angina of new onset (unstable angina) I have recommended cardiac catheterization and possible PCI. I have reviewed the risks, indications, and alternatives to cardiac catheterization, possible angioplasty, and stenting with the patient. Risks include but are not limited to bleeding, infection, vascular injury, stroke, myocardial infection, arrhythmia, kidney injury, radiation-related injury in the case of prolonged fluoroscopy use, emergency cardiac surgery, and death. The patient understands the risks of serious complication is 1-2 in 8768 with diagnostic cardiac cath and 1-2% or less with angioplasty/stenting.   The patient is anticoagulated with Xarelto and will hold this the day prior to cath and day of the procedure. Will determine best antithrombotic strategy post-cath depending on findings. He will otherwise continue current medicines.  2. Atrial fibrillation/flutter: followed closely by Dr Caryl Comes. In sinus today with atrial sensed, v-paced rhythm. Anticoagulated with Xarelto.   3. Lipids: statin intolerant. Will explore PCSK9 inhibitors as an option for him.   Current medicines are reviewed with the patient today.  The patient does not have concerns regarding medicines.  Labs/ tests ordered today include:  No orders of the defined types were placed in this encounter.   Disposition:   FU Bryn Gulling, MD  07/15/2017 8:24 AM    [facility] [address], [place], [place]  [address] Phone: [phone]; Fax: [phone]

## 2017-07-21 NOTE — Interval H&P Note (Signed)
Cath Lab Visit (complete for each Cath Lab visit)  Clinical Evaluation Leading to the Procedure:   ACS: No.  Non-ACS:    Anginal Classification: CCS II  Anti-ischemic medical therapy: Maximal Therapy (2 or more classes of medications)  Non-Invasive Test Results: No non-invasive testing performed  Prior CABG: No previous CABG  History and Physical Interval Note:  07/21/2017 4:25 PM  [name], MD  has presented today for surgery, with the diagnosis of cad - angina  The various methods of treatment have been discussed with the patient and family. After consideration of risks, benefits and other options for treatment, the patient has consented to  Procedure(s): LEFT HEART CATH AND CORONARY ANGIOGRAPHY ([name]) as a surgical intervention .  The patient's history has been reviewed, patient examined, no change in status, stable for surgery.  I have reviewed the patient's chart and labs.  Questions were answered to the patient's satisfaction.     [name]

## 2017-07-22 ENCOUNTER — Encounter (HOSPITAL_COMMUNITY): Payer: Self-pay | Admitting: Cardiovascular Disease

## 2017-07-22 DIAGNOSIS — I25118 Atherosclerotic heart disease of native coronary artery with other forms of angina pectoris: Secondary | ICD-10-CM | POA: Diagnosis not present

## 2017-07-22 DIAGNOSIS — E785 Hyperlipidemia, unspecified: Secondary | ICD-10-CM | POA: Diagnosis not present

## 2017-07-22 DIAGNOSIS — G250 Essential tremor: Secondary | ICD-10-CM | POA: Diagnosis not present

## 2017-07-22 DIAGNOSIS — I10 Essential (primary) hypertension: Secondary | ICD-10-CM | POA: Diagnosis not present

## 2017-07-22 LAB — BASIC METABOLIC PANEL
ANION GAP: 7 (ref 5–15)
BUN: 9 mg/dL (ref 6–20)
CALCIUM: 8.6 mg/dL — AB (ref 8.9–10.3)
CO2: 23 mmol/L (ref 22–32)
Chloride: 104 mmol/L (ref 101–111)
Creatinine, Ser: 0.94 mg/dL (ref 0.61–1.24)
Glucose, Bld: 93 mg/dL (ref 65–99)
Potassium: 4.1 mmol/L (ref 3.5–5.1)
SODIUM: 134 mmol/L — AB (ref 135–145)

## 2017-07-22 LAB — CBC
HCT: 36.9 % — ABNORMAL LOW (ref 39.0–52.0)
HEMOGLOBIN: 12.7 g/dL — AB (ref 13.0–17.0)
MCH: 30 pg (ref 26.0–34.0)
MCHC: 34.4 g/dL (ref 30.0–36.0)
MCV: 87 fL (ref 78.0–100.0)
Platelets: 146 10*3/uL — ABNORMAL LOW (ref 150–400)
RBC: 4.24 MIL/uL (ref 4.22–5.81)
RDW: 13.1 % (ref 11.5–15.5)
WBC: 4.8 10*3/uL (ref 4.0–10.5)

## 2017-07-22 MED ORDER — CLOPIDOGREL BISULFATE 75 MG PO TABS: 75.0000 mg | ORAL_TABLET | Freq: Every day | ORAL | 2 refills | Status: DC

## 2017-07-22 NOTE — Progress Notes (Signed)
TR BAND REMOVAL  LOCATION:    right radial  DEFLATED PER PROTOCOL:    Yes.    TIME BAND OFF / DRESSING APPLIED:    03:00   SITE UPON ARRIVAL:    Level 1 (small hematoma; Bruise) pressure dressing applied  SITE AFTER BAND REMOVAL:    Level 1  CIRCULATION SENSATION AND MOVEMENT:    Within Normal Limits   Yes.    COMMENTS:  Pt rebleed multiple times during deflation.  Post TR band instructions given. Pt tolerated well.

## 2017-07-22 NOTE — Discharge Instructions (Addendum)

## 2017-07-22 NOTE — Discharge Summary (Signed)
Discharge Summary    Patient ID: Walter Mt, MD,  MRN: [id number], DOB/AGE: 05/06/1946 71 y.o.  Admit date: 07/21/2017 Discharge date: 07/22/2017  Primary Care Provider: [name] Primary Cardiologist: Burt Knack  Discharge Diagnoses    Active Problems:   Coronary artery disease with exertional angina [facility])   Allergies Allergies  Allergen Reactions  . Ceclor [Cefaclor] Hives  . Chlorhexidine Itching and Rash  . Statins Other (See Comments)    Muscle weakness  . Ace Inhibitors Other (See Comments)    Does not take due to "angiotensin is responsible for maintaining bladder sphincter tone"    Diagnostic Studies/Procedures    LHC: 07/21/17  Conclusion   1. Severe single-vessel coronary artery disease with critical stenosis of the proximal LAD, treated successfully with PCI using a 3.5 x 15 mm Xience Sierra drug-eluting stent 2. Moderate residual coronary artery disease involving the mid right coronary artery and mid LAD 3. 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 Normal LV systolic function (improved from previous)  Recommendations: The patient is chronically anticoagulated with Xarelto for atrial fibrillation. This can be restarted tomorrow. Recommend triple therapy with aspirin 81 mg, Plavix 75 mg, and Xarelto 20 mg for a period of one month. After that time I would recommend discontinuing aspirin and remaining on a combination of Plavix and Xarelto for total of 6 months from the time of this PCI procedure.   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 He underwent cardiac catheterization last in 2013 demonstrating high-grade bifurcational disease involving the left circumflex/OM 1. He was noted to have moderate RCA stenosis and minor nonobstructive LAD stenosis at that time. He  underwent PCI of the left circumflex with drug-eluting stent placement into a large obtuse marginal. There was side branch occlusion with collateral filling at the completion of the procedure.  He also has atrial flutter with history of tachybradycardia syndrome and has undergone permanent pacemaker placement. He's undergone catheter ablation procedures as well.   The patient has done well over the last several years with minimal angina.  In the past 2 weeks prior to his office visit he has developed exertional angina. He was walking on the treadmill and in the first 10 minutes developed a band-like pressure across the chest turning into a pressure into the center of his chest. He has reproduced this 3 or 4 times with walking since then. Also had symptoms walking up a hill at a slow pace with his dog. Pain did not radiate from the chest. No other exacerbating factors. Symptoms resolve after several minutes of rest.  He denied resting chest pain or pressure. No dyspnea, palpitations, or diaphoresis. His symptoms are like that of past angina. He has had anginal chest pain rarely with emotional stress but has not had exertional chest pain in several years. He was given Imdur but unable to tolerate due to headache. Given his symptoms he was referred for cardiac cath.   Hospital Course     Underwent cardiac cath with Dr. Burt Knack noted above with successful PCI/DES x1 to pLAD. LV function noted as normal and improved from previous echo. Did have bleeding from TR band post cath, but stable as of this morning. Plan for triple therapy with ASA, plavix and Xarelto for one month.  Then plan to stop ASA and continue plavix, and Xarelto. Morning labs were stable with Cr. 0.94 and Hgb 12.7. Worked well with cardiac rehab without any complications. Noted to be intolerant to multiple statins in the past 2/2 to myalgias. Consider referral to lipid clinic as outpatient.   General: Well developed, well nourished, male  appearing in no acute distress. Head: Normocephalic, atraumatic.  Neck: Supple without bruits, JVD. Lungs:  Resp regular and unlabored, CTA. Heart: RRR, S1, S2, no S3, S4, or murmur; no rub. Abdomen: Soft, non-tender, non-distended with normoactive bowel sounds. No hepatomegaly. No rebound/guarding. No obvious abdominal masses. Extremities: No clubbing, cyanosis, edema. Distal pedal pulses are 2+ bilaterally. L/R radial cath site stable with mild bruising.  Neuro: Alert and oriented X 3. Moves all extremities spontaneously. Psych: Normal affect.  Walter Mt, MD was seen by Dr. Tamala Julian and determined stable for discharge home. Follow up in the office has been arranged. Medications are listed below.   _____________  Discharge Vitals Blood pressure 133/88, pulse 70, temperature 98 F (36.7 C), temperature source Oral, resp. rate 18, height 6' (1.829 m), weight 199 lb 1.2 oz (90.3 kg), SpO2 98 %.  Filed Weights   07/21/17 1254 07/21/17 1942 07/22/17 0041  Weight: 199 lb (90.3 kg) 199 lb 1.2 oz (90.3 kg) 199 lb 1.2 oz (90.3 kg)    Labs & Radiologic Studies    CBC  Recent Labs  07/22/17 0216  WBC 4.8  HGB 12.7*  HCT 36.9*  MCV 87.0  PLT 025*   Basic Metabolic Panel  Recent Labs  07/22/17 0216  NA 134*  K 4.1  CL 104  CO2 23  GLUCOSE 93  BUN 9  CREATININE 0.94  CALCIUM 8.6*   Liver Function Tests No results for input(s): AST, ALT, ALKPHOS, BILITOT, PROT, ALBUMIN in the last 72 hours. No results for input(s): LIPASE, AMYLASE in the last 72 hours. Cardiac Enzymes No results for input(s): CKTOTAL, CKMB, CKMBINDEX, TROPONINI in the last 72 hours. BNP Invalid input(s): POCBNP D-Dimer No results for input(s): DDIMER in the last 72 hours. Hemoglobin A1C No results for input(s): HGBA1C in the last 72 hours. Fasting Lipid Panel No results for input(s): CHOL, HDL, LDLCALC, TRIG, CHOLHDL, LDLDIRECT in the last 72 hours. Thyroid Function Tests No results for input(s): TSH,  T4TOTAL, T3FREE, THYROIDAB in the last 72 hours.  Invalid input(s): FREET3 _____________  No results found. Disposition   Pt is being discharged home today in good condition.  Follow-up Plans & Appointments    Follow-up Information    Liliane Shi, PA-C Follow up on 08/13/2017.   Specialties:  Cardiology, Physician Assistant Why:  at 9:15am for your follow up appt.  Contact information: [address] [place] [address] [phone]          Discharge Instructions    Amb Referral to Cardiac Rehabilitation    Complete by:  As directed    Diagnosis:  Coronary Stents   Call MD for:  redness, tenderness, or signs of infection (pain, swelling, redness, odor or green/yellow discharge around incision site)    Complete by:  As directed    Diet - low sodium heart healthy    Complete by:  As directed    Discharge instructions    Complete by:  As directed    Radial Site Care Refer to this sheet in the next few weeks. These instructions provide you with information on caring for yourself after your procedure. Your  caregiver may also give you more specific instructions. Your treatment has been planned according to current medical practices, but problems sometimes occur. Call your caregiver if you have any problems or questions after your procedure. HOME CARE INSTRUCTIONS You may shower the day after the procedure.Remove the bandage (dressing) and gently wash the site with plain soap and water.Gently pat the site dry.  Do not apply powder or lotion to the site.  Do not submerge the affected site in water for 3 to 5 days.  Inspect the site at least twice daily.  Do not flex or bend the affected arm for 24 hours.  No lifting over 5 pounds (2.3 kg) for 5 days after your procedure.  Do not drive home if you are discharged the same day of the procedure. Have someone else drive you.  You may drive 24 hours after the procedure unless otherwise instructed by your  caregiver.  What to expect: Any bruising will usually fade within 1 to 2 weeks.  Blood that collects in the tissue (hematoma) may be painful to the touch. It should usually decrease in size and tenderness within 1 to 2 weeks.  SEEK IMMEDIATE MEDICAL CARE IF: You have unusual pain at the radial site.  You have redness, warmth, swelling, or pain at the radial site.  You have drainage (other than a small amount of blood on the dressing).  You have chills.  You have a fever or persistent symptoms for more than 72 hours.  You have a fever and your symptoms suddenly get worse.  Your arm becomes pale, cool, tingly, or numb.  You have heavy bleeding from the site. Hold pressure on the site.   You will need to be on triple therapy with aspirin, plavix and xarelto for one month. Then will plan to stop aspirin and continue with plavix and xarelto. Please monitor for signs and symptoms of bleeding with these medications.   Increase activity slowly    Complete by:  As directed       Discharge Medications     Medication List    TAKE these medications   acyclovir 400 MG tablet Commonly known as:  ZOVIRAX Take 400 mg by mouth as needed. Herpes flare on legs   aspirin EC 81 MG tablet Take 81 mg by mouth daily.   cholecalciferol 1000 units tablet Commonly known as:  VITAMIN D Take 1,000 Units by mouth daily.   clopidogrel 75 MG tablet Commonly known as:  PLAVIX Take 1 tablet (75 mg total) by mouth daily with breakfast.   losartan 25 MG tablet Commonly known as:  COZAAR Take 1 tablet (25 mg total) by mouth 2 (two) times daily.   nitroGLYCERIN 0.4 MG SL tablet Commonly known as:  NITROSTAT Place 0.4 mg under the tongue every 5 (five) minutes as needed for chest pain (x 3 tabs).   propranolol 10 MG tablet Commonly known as:  INDERAL Take 10 mg by mouth 3 (three) times daily as needed. Palpitations or benign central tremor   vitamin B-12 1000 MCG tablet Commonly known as:   CYANOCOBALAMIN Take 1,000 mcg by mouth daily.   XARELTO 20 MG Tabs tablet Generic drug:  rivaroxaban TAKE 1 TABLET(20 MG) BY MOUTH DAILY WITH DINNER What changed:  See the new instructions.        Aspirin prescribed at discharge?  Yes High Intensity Statin Prescribed? (Lipitor 40-80mg  or Crestor 20-40mg ): No: Intolerant Beta Blocker Prescribed? No: Hx of arrhythmias, followed by Dr. Caryl Comes. Would defer  change in BB to EP.  For EF <40%, was ACEI/ARB Prescribed? Yes ADP Receptor Inhibitor Prescribed? (i.e. Plavix etc.-Includes Medically Managed Patients): Yes For EF <40%, Aldosterone Inhibitor Prescribed? No: EF improved Was EF assessed during THIS hospitalization? Yes Was Cardiac Rehab II ordered? (Included Medically managed Patients): Yes   Outstanding Labs/Studies   none  Duration of Discharge Encounter   Greater than 30 minutes including physician time.  Signed, Reino Bellis NP-C 07/22/2017, 12:19 PM   Patient seen, examined. Available data reviewed. Agree with findings, assessment, and plan as outlined by Reino Bellis, NP-C. On my exam there is some mild bruising at the right radial site extending up the arm. There is no firm hematoma. The patient appears stable for discharge. He had no anginal symptoms with walking with cardiac rehabilitation this morning. He will take triple therapy with aspirin, clopidogrel, and Xarelto for 1 month. I recommended a lipid clinic referral for consideration of a PCSK9 inhibitor in this statin-intolerant patient with extensive CAD. He is agreeable.  [name], M.D. 07/22/2017 12:19 PM

## 2017-07-22 NOTE — Progress Notes (Signed)
CARDIAC REHAB PHASE I   PRE:  Rate/Rhythm: paced 70  BP:  Supine:   Sitting: 126/91  Standing:    SaO2:   MODE:  Ambulation: 800 ft   POST:  Rate/Rhythm: 95 paced  BP:  Supine:   Sitting: 158/100, 157/98  Standing:  8412-8208 Pt walked 800 ft with steady gait. No CP. Discussed importance of plavix with stent. Reviewed NTG use, risk factors, heart healthy eating, ex ed and CRP 2. Pt has attended CRP 2 before and does not think he will do again. Left brochure and will let Phase 2 follow up with pt. Referral made to [place] in case pt changes his mind. Graylon Good, RN BSN  07/22/2017 8:58 AM

## 2017-07-22 NOTE — Care Management Note (Signed)
Case Management Note  Patient Details  Name: JOBIN MONTELONGO, MD MRN: [id number] Date of Birth: 1946-02-29  Subjective/Objective:   From home s/p coronary stent intervention, will be on plavix and xarelto, has been taking xarelto previously.                 Action/Plan: NCM will follow for dc needs.   Expected Discharge Date:                  Expected Discharge Plan:  Home/Self Care  In-House Referral:     Discharge planning Services  CM Consult  Post Acute Care Choice:    Choice offered to:     DME Arranged:    DME Agency:     HH Arranged:    HH Agency:     Status of Service:  In process, will continue to follow  If discussed at Long Length of Stay Meetings, dates discussed:    Additional Comments:  Zenon Mayo, RN 07/22/2017, 9:20 AM

## 2017-07-29 ENCOUNTER — Telehealth (HOSPITAL_COMMUNITY): Payer: Self-pay

## 2017-07-29 NOTE — Telephone Encounter (Signed)
Patient insurances are active and benefits verified. Patient insurances are Medicare A/B and BCBS supplement.  Medicare A/B - no co-payment, deductible $183.00/$183.00 has been met, 20% co-insurance, no out of pocket and no pre-authorization. Passport/reference [phone].  BCBS supplement - no co-payment, no deductible, no out of pocket, and no co-insurance and no pre-authorization. Passport/reference [phone]

## 2017-08-02 ENCOUNTER — Telehealth: Payer: Self-pay | Admitting: Cardiovascular Disease

## 2017-08-02 NOTE — Telephone Encounter (Signed)
Patient is having a dental cleaning in October and wants to check with Dr. Burt Knack that he does not need SBE.

## 2017-08-02 NOTE — Telephone Encounter (Signed)
New Message  Pt call requesting to speak with RN . Pt states he will discuss upon call returned. Please call back to discuss

## 2017-08-05 ENCOUNTER — Telehealth (HOSPITAL_COMMUNITY): Payer: Self-pay

## 2017-08-05 NOTE — Telephone Encounter (Signed)
Left message for patient to call back tomorrow during normal business hours. Patient does not need SBE prophylaxis.

## 2017-08-05 NOTE — Telephone Encounter (Signed)
Cardiac Rehab Medication Review by a Pharmacist  Does the patient  feel that his/her medications are working for him/her?  yes  Has the patient been experiencing any side effects to the medications prescribed?  Yes; He is experiencing confusion that he believes may be secondary to the plavix. After a quick drug review, there is a slight incidence in plavix causing confusion. I recommended talking to his cardiologist.  Does the patient measure his/her own blood pressure or blood glucose at home?  no   Does the patient have any problems obtaining medications due to transportation or finances?   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 I spoke with him regarding his allergy and medication history. He reports confusion with his plavix as mentioned above and I recommended speaking to his cardiologist. He also asked about PCSK-9 inhibitors for his cholesterol since he is statin intolerant and we discussed their benefits. He denies any other concerns or questions at this time.  [name], PharmD PGY1 Acute Care Pharmacy Resident 08/05/2017 7:06 PM

## 2017-08-06 NOTE — Telephone Encounter (Signed)
Patient advised and verbalized understanding of plan.

## 2017-08-06 NOTE — Telephone Encounter (Signed)
Follow up ° ° ° °Pt is returning call from yesterday. °

## 2017-08-09 ENCOUNTER — Telehealth (HOSPITAL_COMMUNITY): Payer: Self-pay | Admitting: *Deleted

## 2017-08-09 NOTE — Telephone Encounter (Signed)
-----   Message from Liliane Shi, [place] sent at 08/09/2017  3:01 PM EDT ----- Regarding: RE: Madaline Brilliant to proceed with Cardiac Rehab orientation As long as he is not having any chest pain or recurrent anginal symptoms, he can proceed on 9/25 for orientation. Richardson Dopp, PA-C    08/09/2017 3:02 PM   ----- Message ----- From: Rowe Pavy, RN Sent: 08/07/2017   7:03 AM To: Sharmon Revere, [name], MD Subject: Madaline Brilliant to proceed with Cardiac Rehab orientation    The above pt is s/p 9/5 DES to LAD.  Pt is eager to begin cardiac rehab and is scheduled for orientation on 9/25 and will have his follow up from his stent on 9/28 with [place].  Orientation consist of vital signs, measurements 6 minute walk test and general overview of what to expect.  May pt proceed?  Thank you for your input!  Cherre Huger, BSN Cardiac and Training and development officer

## 2017-08-10 ENCOUNTER — Encounter (HOSPITAL_COMMUNITY): Payer: Self-pay

## 2017-08-10 ENCOUNTER — Telehealth (HOSPITAL_COMMUNITY): Payer: Self-pay | Admitting: *Deleted

## 2017-08-10 ENCOUNTER — Encounter (HOSPITAL_COMMUNITY)
Admission: RE | Admit: 2017-08-10 | Discharge: 2017-08-10 | Disposition: A | Discharge status: Home / Self Care | Payer: Medicare Other | Source: Ambulatory Visit | Attending: Cardiovascular Disease | Admitting: Cardiovascular Disease

## 2017-08-10 VITALS — BP 128/84 | HR 69 | Ht 72.45 in | Wt 191.8 lb

## 2017-08-10 DIAGNOSIS — Z79899 Other long term (current) drug therapy: Secondary | ICD-10-CM | POA: Diagnosis not present

## 2017-08-10 DIAGNOSIS — Z955 Presence of coronary angioplasty implant and graft: Secondary | ICD-10-CM | POA: Insufficient documentation

## 2017-08-10 DIAGNOSIS — Z7902 Long term (current) use of antithrombotics/antiplatelets: Secondary | ICD-10-CM | POA: Insufficient documentation

## 2017-08-10 DIAGNOSIS — I10 Essential (primary) hypertension: Secondary | ICD-10-CM | POA: Insufficient documentation

## 2017-08-10 DIAGNOSIS — I440 Atrioventricular block, first degree: Secondary | ICD-10-CM | POA: Diagnosis not present

## 2017-08-10 DIAGNOSIS — Z7901 Long term (current) use of anticoagulants: Secondary | ICD-10-CM | POA: Insufficient documentation

## 2017-08-10 DIAGNOSIS — Z7982 Long term (current) use of aspirin: Secondary | ICD-10-CM | POA: Diagnosis not present

## 2017-08-10 DIAGNOSIS — I2510 Atherosclerotic heart disease of native coronary artery without angina pectoris: Secondary | ICD-10-CM | POA: Insufficient documentation

## 2017-08-10 DIAGNOSIS — E785 Hyperlipidemia, unspecified: Secondary | ICD-10-CM | POA: Insufficient documentation

## 2017-08-10 NOTE — Progress Notes (Signed)
Cardiac Individual Treatment Plan  Patient Details  Name: MAURIZIO GENO, MD MRN: [id number] Date of Birth: 06-30-1946 Referring Provider:     CARDIAC REHAB PHASE II ORIENTATION from 08/10/2017 in [place]  Referring Provider  [name], MD      Initial Encounter Date:    CARDIAC REHAB PHASE II ORIENTATION from 08/10/2017 in [place]  Date  08/10/17  Referring Provider  [name], MD      Visit Diagnosis: S/P coronary artery stent placement 07/22/2017 DES LAD  Patient's Home Medications on Admission:  Current Outpatient Prescriptions:  .  acyclovir (ZOVIRAX) 400 MG tablet, Take 400 mg by mouth as needed. Herpes flare on legs, Disp: , Rfl:  .  aspirin EC 81 MG tablet, Take 81 mg by mouth daily., Disp: , Rfl:  .  cholecalciferol (VITAMIN D) 1000 UNITS tablet, Take 1,000 Units by mouth daily., Disp: , Rfl:  .  clopidogrel (PLAVIX) 75 MG tablet, Take 1 tablet (75 mg total) by mouth daily with breakfast., Disp: 90 tablet, Rfl: 2 .  losartan (COZAAR) 25 MG tablet, Take 1 tablet (25 mg total) by mouth 2 (two) times daily., Disp: 60 tablet, Rfl: 6 .  nitroGLYCERIN (NITROSTAT) 0.4 MG SL tablet, Place 0.4 mg under the tongue every 5 (five) minutes as needed for chest pain (x 3 tabs)., Disp: , Rfl:  .  propranolol (INDERAL) 10 MG tablet, Take 10 mg by mouth 3 (three) times daily as needed. Palpitations or benign central tremor , Disp: , Rfl:  .  vitamin B-12 (CYANOCOBALAMIN) 1000 MCG tablet, Take 1,000 mcg by mouth daily., Disp: , Rfl:  .  XARELTO 20 MG TABS tablet, TAKE 1 TABLET(20 MG) BY MOUTH DAILY WITH DINNER (Patient taking differently: TAKE 1 TABLET(20 MG) BY MOUTH DAILY), Disp: 90 tablet, Rfl: 1  Past Medical History: Past Medical History:  Diagnosis Date  . Atrial flutter -atypical 2007   ABLATION x2 [facility]  . Atrial tachycardia ([place]) 2007   ABLATION [facility]  . Benign paroxysmal vertigo   . CAD (coronary artery  disease)    a. abnl mv 01/2012;  b. cath 05/23/2012 LCX 80@ bifurcation w/ OM, 95% @ AV groove, otw nonobs dzs, EF 55-65%;  . Carcinoma ([place])    skin cancer "off my chest"  . Familial tremor   . First degree AV block    had 4 ablasions that left py with heart block  . History of blood transfusion 2010   "2nd day after prostatectomy"  . Hyperlipidemia   . Hypertension   . Incontinence    DECREASING INCONTINENCE RELATED TO PROSTATECTOMY.  . Migraines   . Presence of permanent cardiac pacemaker 2013 ?  Marland Kitchen Prostate cancer ([place])   . Pulsatile tinnitus   . Tachy-brady syndrome ([place])    a. developed after RFCA @ [facility] 2013-> MDT PPM;  b. s/p RA lead revision 05/2012    Tobacco Use: History  Smoking Status  . Never Smoker  Smokeless Tobacco  . Never Used    Labs: Recent Review Flowsheet Data    There is no flowsheet data to display.      Capillary Blood Glucose: No results found for: GLUCAP   Exercise Target Goals: Date: 08/10/17  Exercise Program Goal: Individual exercise prescription set with THRR, safety & activity barriers. Participant demonstrates ability to understand and report RPE using BORG scale, to self-measure pulse accurately, and to acknowledge the importance of the exercise prescription.  Exercise Prescription Goal: Starting with aerobic activity 30 plus minutes a day, 3 days per week for initial exercise prescription. Provide home exercise prescription and guidelines that participant acknowledges understanding prior to discharge.  Activity Barriers & Risk Stratification:     Activity Barriers & Cardiac Risk Stratification - 08/10/17 1408      Activity Barriers & Cardiac Risk Stratification   Activity Barriers None   Cardiac Risk Stratification Moderate      6 Minute Walk:     6 Minute Walk    Row Name 08/10/17 1417         6 Minute Walk   Phase Initial     Distance 1434 feet     Walk Time 6 minutes     # of Rest Breaks 0     MPH 2.72     METS  3.11     RPE 11     VO2 Peak 10.9     Symptoms No     Resting HR 69 bpm     Resting BP 128/84     Resting Oxygen Saturation  97 %     Max Ex. HR 98 bpm     Max Ex. BP 122/82     2 Minute Post BP 124/82        Oxygen Initial Assessment:   Oxygen Re-Evaluation:   Oxygen Discharge (Final Oxygen Re-Evaluation):   Initial Exercise Prescription:     Initial Exercise Prescription - 08/10/17 1400      Date of Initial Exercise RX and Referring Provider   Date 08/10/17   Referring Provider [name], MD     Treadmill   MPH 2.8   Grade 0   Minutes 10   METs 3.14     Bike   Level 1   Minutes 10   METs 3.16     NuStep   Level 4   SPM 90   Minutes 10   METs 3.1     Prescription Details   Frequency (times per week) 3   Duration Progress to 30 minutes of continuous aerobic without signs/symptoms of physical distress     Intensity   THRR 40-80% of Max Heartrate 59-118   Ratings of Perceived Exertion 11-13   Perceived Dyspnea 0-4     Progression   Progression Continue to progress workloads to maintain intensity without signs/symptoms of physical distress.     Resistance Training   Training Prescription Yes   Weight 4lbs   Reps 10-15      Perform Capillary Blood Glucose checks as needed.  Exercise Prescription Changes:   Exercise Comments:   Exercise Goals and Review:     Exercise Goals    Row Name 08/10/17 1409             Exercise Goals   Increase Physical Activity Yes       Intervention Provide advice, education, support and counseling about physical activity/exercise needs.;Develop an individualized exercise prescription for aerobic and resistive training based on initial evaluation findings, risk stratification, comorbidities and participant's personal goals.       Expected Outcomes Achievement of increased cardiorespiratory fitness and enhanced flexibility, muscular endurance and strength shown through measurements of functional  capacity and personal statement of participant.       Increase Strength and Stamina Yes       Intervention Provide advice, education, support and counseling about physical activity/exercise needs.;Develop an individualized exercise prescription for aerobic and resistive training based on initial evaluation findings,  risk stratification, comorbidities and participant's personal goals.       Expected Outcomes Achievement of increased cardiorespiratory fitness and enhanced flexibility, muscular endurance and strength shown through measurements of functional capacity and personal statement of participant.       Able to understand and use rate of perceived exertion (RPE) scale Yes       Intervention Provide education and explanation on how to use RPE scale       Expected Outcomes Long Term:  Able to use RPE to guide intensity level when exercising independently;Short Term: Able to use RPE daily in rehab to express subjective intensity level       Knowledge and understanding of Target Heart Rate Range (THRR) Yes       Intervention Provide education and explanation of THRR including how the numbers were predicted and where they are located for reference       Expected Outcomes Short Term: Able to state/look up THRR;Short Term: Able to use daily as guideline for intensity in rehab;Long Term: Able to use THRR to govern intensity when exercising independently       Able to check pulse independently Yes       Intervention Provide education and demonstration on how to check pulse in carotid and radial arteries.;Review the importance of being able to check your own pulse for safety during independent exercise       Expected Outcomes Short Term: Able to explain why pulse checking is important during independent exercise;Long Term: Able to check pulse independently and accurately       Understanding of Exercise Prescription Yes       Intervention Provide education, explanation, and written materials on patient's  individual exercise prescription       Expected Outcomes Short Term: Able to explain program exercise prescription;Long Term: Able to explain home exercise prescription to exercise independently          Exercise Goals Re-Evaluation :    Discharge Exercise Prescription (Final Exercise Prescription Changes):   Nutrition:  Target Goals: Understanding of nutrition guidelines, daily intake of sodium 1500mg , cholesterol 200mg , calories 30% from fat and 7% or less from saturated fats, daily to have 5 or more servings of fruits and vegetables.  Biometrics:     Pre Biometrics - 08/10/17 1512      Pre Biometrics   Height 6' 0.45" (1.84 m)   Weight 191 lb 12.8 oz (87 kg)   Hip Circumference 39.75 inches   BMI (Calculated) 25.7   Triceps Skinfold 11 mm   % Body Fat 24.2 %   Grip Strength 53 kg   Flexibility 14 in   Single Leg Stand 4.92 seconds       Nutrition Therapy Plan and Nutrition Goals:   Nutrition Discharge: Nutrition Scores:   Nutrition Goals Re-Evaluation:   Nutrition Goals Re-Evaluation:   Nutrition Goals Discharge (Final Nutrition Goals Re-Evaluation):   Psychosocial: Target Goals: Acknowledge presence or absence of significant depression and/or stress, maximize coping skills, provide positive support system. Participant is able to verbalize types and ability to use techniques and skills needed for reducing stress and depression.  Initial Review & Psychosocial Screening:     Initial Psych Review & Screening - 08/10/17 1401      Initial Review   Current issues with None Identified     Family Dynamics   Good Support System? Yes   Comments brief assessment shows good support mechanisms. No further interventions needed at this time     Barriers  Psychosocial barriers to participate in program There are no identifiable barriers or psychosocial needs.     Screening Interventions   Interventions Encouraged to exercise      Quality of Life Scores:      Quality of Life - 08/10/17 1420      Quality of Life Scores   Health/Function Pre 26.5 %   Socioeconomic Pre 27.86 %   Psych/Spiritual Pre 29.29 %   Family Pre 30 %   GLOBAL Pre 27.87 %      PHQ-9: Recent Review Flowsheet Data    Depression screen [facility] 2/9 07/04/2012   Decreased Interest 0   Down, Depressed, Hopeless 0   PHQ - 2 Score 0     Interpretation of Total Score  Total Score Depression Severity:  1-4 = Minimal depression, 5-9 = Mild depression, 10-14 = Moderate depression, 15-19 = Moderately severe depression, 20-27 = Severe depression   Psychosocial Evaluation and Intervention:   Psychosocial Re-Evaluation:   Psychosocial Discharge (Final Psychosocial Re-Evaluation):   Vocational Rehabilitation: Provide vocational rehab assistance to qualifying candidates.   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 Participant will state understanding/return demonstration of topics presented.  Learning Barriers/Preferences:     Learning Barriers/Preferences - 08/10/17 1357      Learning Barriers/Preferences   Learning Barriers Sight   Learning Preferences Audio;Group Instruction;Individual Instruction;Pictoral;Skilled Demonstration;Verbal Instruction;Video;Written Material      Education Topics: Count Your Pulse:  -Group instruction provided by verbal instruction, demonstration, patient participation and written materials to support subject.  Instructors address importance of being able to find your pulse and how to count your pulse when at home without a heart monitor.  Patients get hands on experience counting their pulse with staff help and  individually.   Heart Attack, Angina, and Risk Factor Modification:  -Group instruction provided by verbal instruction, video, and written materials to support subject.  Instructors address signs and symptoms of angina and heart attacks.    Also discuss risk factors for heart disease and how to make changes to improve heart health risk factors.   Functional Fitness:  -Group instruction provided by verbal instruction, demonstration, patient participation, and written materials to support subject.  Instructors address safety measures for doing things around the house.  Discuss how to get up and down off the floor, how to pick things up properly, how to safely get out of a chair without assistance, and balance training.   Meditation and Mindfulness:  -Group instruction provided by verbal instruction, patient participation, and written materials to support subject.  Instructor addresses importance of mindfulness and meditation practice to help reduce stress and improve awareness.  Instructor also leads participants through a meditation exercise.    Stretching for Flexibility and Mobility:  -Group instruction provided by verbal instruction, patient participation, and written materials to support subject.  Instructors lead participants through series of stretches that are designed to increase flexibility thus improving mobility.  These stretches are additional exercise for major muscle groups that are typically performed during regular warm up and cool down.   Hands Only CPR:  -Group verbal, video, and participation provides a basic overview of AHA guidelines for community CPR. Role-play of emergencies  allow participants the opportunity to practice calling for help and chest compression technique with discussion of AED use.   Hypertension: -Group verbal and written instruction that provides a basic overview of hypertension including the most recent diagnostic guidelines, risk factor reduction with  self-care instructions and medication management.    Nutrition I class: Heart Healthy Eating:  -Group instruction provided by PowerPoint slides, verbal discussion, and written materials to support subject matter. The instructor gives an explanation and review of the Therapeutic Lifestyle Changes diet recommendations, which includes a discussion on lipid goals, dietary fat, sodium, fiber, plant stanol/sterol esters, sugar, and the components of a well-balanced, healthy diet.   Nutrition II class: Lifestyle Skills:  -Group instruction provided by PowerPoint slides, verbal discussion, and written materials to support subject matter. The instructor gives an explanation and review of label reading, grocery shopping for heart health, heart healthy recipe modifications, and ways to make healthier choices when eating out.   Diabetes Question & Answer:  -Group instruction provided by PowerPoint slides, verbal discussion, and written materials to support subject matter. The instructor gives an explanation and review of diabetes co-morbidities, pre- and post-prandial blood glucose goals, pre-exercise blood glucose goals, signs, symptoms, and treatment of hypoglycemia and hyperglycemia, and foot care basics.   Diabetes Blitz:  -Group instruction provided by PowerPoint slides, verbal discussion, and written materials to support subject matter. The instructor gives an explanation and review of the physiology behind type 1 and type 2 diabetes, diabetes medications and rational behind using different medications, pre- and post-prandial blood glucose recommendations and Hemoglobin A1c goals, diabetes diet, and exercise including blood glucose guidelines for exercising safely.    Portion Distortion:  -Group instruction provided by PowerPoint slides, verbal discussion, written materials, and food models to support subject matter. The instructor gives an explanation of serving size versus portion size, changes in  portions sizes over the last 20 years, and what consists of a serving from each food group.   Stress Management:  -Group instruction provided by verbal instruction, video, and written materials to support subject matter.  Instructors review role of stress in heart disease and how to cope with stress positively.     Exercising on Your Own:  -Group instruction provided by verbal instruction, power point, and written materials to support subject.  Instructors discuss benefits of exercise, components of exercise, frequency and intensity of exercise, and end points for exercise.  Also discuss use of nitroglycerin and activating EMS.  Review options of places to exercise outside of rehab.  Review guidelines for sex with heart disease.   Cardiac Drugs I:  -Group instruction provided by verbal instruction and written materials to support subject.  Instructor reviews cardiac drug classes: antiplatelets, anticoagulants, beta blockers, and statins.  Instructor discusses reasons, side effects, and lifestyle considerations for each drug class.   Cardiac Drugs II:  -Group instruction provided by verbal instruction and written materials to support subject.  Instructor reviews cardiac drug classes: angiotensin converting enzyme inhibitors (ACE-I), angiotensin II receptor blockers (ARBs), nitrates, and calcium channel blockers.  Instructor discusses reasons, side effects, and lifestyle considerations for each drug class.   Anatomy and Physiology of the Circulatory System:  Group verbal and written instruction and models provide basic cardiac anatomy and physiology, with the coronary electrical and arterial systems. Review of: AMI, Angina, Valve disease, Heart Failure, Peripheral Artery Disease, Cardiac Arrhythmia, Pacemakers, and the ICD.   Other Education:  -Group or individual verbal, written, or video instructions that support the educational goals  of the cardiac rehab program.   Knowledge Questionnaire  Score:     Knowledge Questionnaire Score - 08/10/17 1413      Knowledge Questionnaire Score   Pre Score 23/24      Core Components/Risk Factors/Patient Goals at Admission:     Personal Goals and Risk Factors at Admission - 08/10/17 1357      Core Components/Risk Factors/Patient Goals on Admission   Hypertension Yes   Intervention Provide education on lifestyle modifcations including regular physical activity/exercise, weight management, moderate sodium restriction and increased consumption of fresh fruit, vegetables, and low fat dairy, alcohol moderation, and smoking cessation.;Monitor prescription use compliance.   Lipids Yes   Intervention Provide education and support for participant on nutrition & aerobic/resistive exercise along with prescribed medications to achieve LDL 70mg , HDL >40mg .   Expected Outcomes Short Term: Participant states understanding of desired cholesterol values and is compliant with medications prescribed. Participant is following exercise prescription and nutrition guidelines.;Long Term: Cholesterol controlled with medications as prescribed, with individualized exercise RX and with personalized nutrition plan. Value goals: LDL < 70mg , HDL > 40 mg.      Core Components/Risk Factors/Patient Goals Review:    Core Components/Risk Factors/Patient Goals at Discharge (Final Review):    ITP Comments:     ITP Comments    Row Name 08/10/17 1337           ITP Comments Dr Fransico Him, Medical Director          Comments: Dr Prescott Parma attended orientation from 1330 to 1430 to review rules and guidelines for program. Completed 6 minute walk test, Intitial ITP, and exercise prescription.  VSS. Telemetry-V paced.  Asymptomatic. Barnet Pall, RN,BSN 08/10/2017 3:17 PM

## 2017-08-10 NOTE — Telephone Encounter (Signed)
-----   Message from [name], MD sent at 08/09/2017 11:07 PM EDT ----- Regarding: RE: Ok to proceed with Cardiac Rehab orientation Agree - fine to proceed with rehab. thanks ----- Message ----- From: Rowe Pavy, RN Sent: 08/07/2017   7:03 AM To: Sharmon Revere, [name], MD Subject: Madaline Brilliant to proceed with Cardiac Rehab orientation    The above pt is s/p 9/5 DES to LAD.  Pt is eager to begin cardiac rehab and is scheduled for orientation on 9/25 and will have his follow up from his stent on 9/28 with Scott.  Orientation consist of vital signs, measurements 6 minute walk test and general overview of what to expect.  May pt proceed?  Thank you for your input!  Cherre Huger, BSN Cardiac and Training and development officer

## 2017-08-13 ENCOUNTER — Ambulatory Visit (INDEPENDENT_AMBULATORY_CARE_PROVIDER_SITE_OTHER): Payer: Medicare Other | Admitting: Physician Assistant

## 2017-08-13 ENCOUNTER — Encounter: Payer: Self-pay | Admitting: Physician Assistant

## 2017-08-13 ENCOUNTER — Ambulatory Visit (INDEPENDENT_AMBULATORY_CARE_PROVIDER_SITE_OTHER): Payer: Medicare Other | Admitting: *Deleted

## 2017-08-13 ENCOUNTER — Telehealth: Payer: Self-pay | Admitting: *Deleted

## 2017-08-13 VITALS — BP 120/78 | HR 76 | Ht 72.0 in | Wt 192.0 lb

## 2017-08-13 DIAGNOSIS — Z950 Presence of cardiac pacemaker: Secondary | ICD-10-CM | POA: Diagnosis not present

## 2017-08-13 DIAGNOSIS — I428 Other cardiomyopathies: Secondary | ICD-10-CM | POA: Diagnosis not present

## 2017-08-13 DIAGNOSIS — E871 Hypo-osmolality and hyponatremia: Secondary | ICD-10-CM

## 2017-08-13 DIAGNOSIS — E785 Hyperlipidemia, unspecified: Secondary | ICD-10-CM

## 2017-08-13 DIAGNOSIS — I25119 Atherosclerotic heart disease of native coronary artery with unspecified angina pectoris: Secondary | ICD-10-CM

## 2017-08-13 DIAGNOSIS — I2510 Atherosclerotic heart disease of native coronary artery without angina pectoris: Secondary | ICD-10-CM | POA: Diagnosis not present

## 2017-08-13 DIAGNOSIS — I4892 Unspecified atrial flutter: Secondary | ICD-10-CM

## 2017-08-13 DIAGNOSIS — I442 Atrioventricular block, complete: Secondary | ICD-10-CM

## 2017-08-13 DIAGNOSIS — R5383 Other fatigue: Secondary | ICD-10-CM

## 2017-08-13 LAB — CUP PACEART INCLINIC DEVICE CHECK
Brady Statistic AP VS Percent: 0.06 %
Brady Statistic AS VP Percent: 9.42 %
Brady Statistic RA Percent Paced: 7.05 %
Brady Statistic RV Percent Paced: 99.09 %
Implantable Lead Implant Date: 20130517
Implantable Lead Implant Date: 20130710
Implantable Lead Implant Date: 20170802
Implantable Lead Location: 753858
Implantable Lead Location: 753860
Implantable Pulse Generator Implant Date: 20170802
Lead Channel Impedance Value: 1045 Ohm
Lead Channel Impedance Value: 1045 Ohm
Lead Channel Impedance Value: 1121 Ohm
Lead Channel Impedance Value: 380 Ohm
Lead Channel Impedance Value: 494 Ohm
Lead Channel Impedance Value: 551 Ohm
Lead Channel Impedance Value: 874 Ohm
Lead Channel Impedance Value: 988 Ohm
Lead Channel Sensing Intrinsic Amplitude: 1.75 mV
Lead Channel Sensing Intrinsic Amplitude: 2 mV
Lead Channel Setting Pacing Amplitude: 1.5 V
Lead Channel Setting Pacing Amplitude: 2.5 V
Lead Channel Setting Sensing Sensitivity: 4 mV
MDC IDC LEAD LOCATION: 753859
MDC IDC MSMT BATTERY REMAINING LONGEVITY: 109 mo
MDC IDC MSMT BATTERY VOLTAGE: 3.01 V
MDC IDC MSMT LEADCHNL LV IMPEDANCE VALUE: 1216 Ohm
MDC IDC MSMT LEADCHNL LV PACING THRESHOLD AMPLITUDE: 1 V
MDC IDC MSMT LEADCHNL LV PACING THRESHOLD PULSEWIDTH: 0.4 ms
MDC IDC MSMT LEADCHNL RV IMPEDANCE VALUE: 399 Ohm
MDC IDC MSMT LEADCHNL RV PACING THRESHOLD AMPLITUDE: 0.75 V
MDC IDC MSMT LEADCHNL RV PACING THRESHOLD PULSEWIDTH: 0.4 ms
MDC IDC MSMT LEADCHNL RV SENSING INTR AMPL: 15.625 mV
MDC IDC SESS DTM: 20180928135203
MDC IDC SET LEADCHNL LV PACING PULSEWIDTH: 0.8 ms
MDC IDC SET LEADCHNL RA PACING AMPLITUDE: 2 V
MDC IDC SET LEADCHNL RV PACING PULSEWIDTH: 0.4 ms
MDC IDC STAT BRADY AP VP PERCENT: 88.92 %
MDC IDC STAT BRADY AS VS PERCENT: 1.98 %

## 2017-08-13 LAB — CBC
Hematocrit: 39.2 % (ref 37.5–51.0)
Hemoglobin: 14.2 g/dL (ref 13.0–17.7)
MCH: 30.7 pg (ref 26.6–33.0)
MCHC: 36.2 g/dL — ABNORMAL HIGH (ref 31.5–35.7)
MCV: 85 fL (ref 79–97)
PLATELETS: 204 10*3/uL (ref 150–379)
RBC: 4.63 x10E6/uL (ref 4.14–5.80)
RDW: 12.8 % (ref 12.3–15.4)
WBC: 5.7 10*3/uL (ref 3.4–10.8)

## 2017-08-13 LAB — BASIC METABOLIC PANEL
BUN/Creatinine Ratio: 13 (ref 10–24)
BUN: 14 mg/dL (ref 8–27)
CO2: 23 mmol/L (ref 20–29)
Calcium: 9.5 mg/dL (ref 8.6–10.2)
Chloride: 90 mmol/L — ABNORMAL LOW (ref 96–106)
Creatinine, Ser: 1.05 mg/dL (ref 0.76–1.27)
GFR calc Af Amer: 81 mL/min/{1.73_m2} (ref >59)
GFR calc non Af Amer: 70 mL/min/{1.73_m2} (ref >59)
GLUCOSE: 93 mg/dL (ref 65–99)
POTASSIUM: 5.1 mmol/L (ref 3.5–5.2)
SODIUM: 128 mmol/L — AB (ref 134–144)

## 2017-08-13 NOTE — Progress Notes (Signed)
CRT-P device check in clinic. Normal device function. Thresholds, sensing, impedance consistent with previous measurements. Histograms appropriate for patient and level of activity. Persistent AF/AFL since ~June 2018 + xarelto. No ventricular high rate episodes. Patient effectively bi-ventricularly pacing 98.1% of the time. Device programmed with appropriate safety margins. LV pulse width increased to 0.78ms. Device heart failure diagnostics are within normal limits and stable over time. Estimated longevity 9.5 years. Patient will follow up as scheduled.

## 2017-08-13 NOTE — Progress Notes (Signed)
[name], MD 71 y.o. male DOB: Jun 27, 1946 MRN: [id number]      Late Entry Nutrition Note  Dx: s/p DES LAD  Past Medical History:  Diagnosis Date  . Atrial flutter -atypical 2007   ABLATION x2 [facility]  . Atrial tachycardia ([place]) 2007   ABLATION [facility]  . Benign paroxysmal vertigo   . CAD (coronary artery disease)    a. abnl mv 01/2012;  b. cath 05/23/2012 LCX 80@ bifurcation w/ OM, 95% @ AV groove, otw nonobs dzs, EF 55-65%;  . Carcinoma ([place])    skin cancer "off my chest"  . Familial tremor   . First degree AV block    had 4 ablasions that left py with heart block  . History of blood transfusion 2010   "2nd day after prostatectomy"  . Hyperlipidemia   . Hypertension   . Incontinence    DECREASING INCONTINENCE RELATED TO PROSTATECTOMY.  . Migraines   . Presence of permanent cardiac pacemaker 2013 ?  Marland Kitchen Prostate cancer ([place])   . Pulsatile tinnitus   . Tachy-brady syndrome ([place])    a. developed after RFCA @ [facility] 2013-> MDT PPM;  b. s/p RA lead revision 05/2012   Meds reviewed.   HT: Ht Readings from Last 1 Encounters:  08/13/17 6' (1.829 m)    WT: Wt Readings from Last 3 Encounters:  08/13/17 192 lb (87.1 kg)  08/10/17 191 lb 12.8 oz (87 kg)  07/22/17 199 lb 1.2 oz (90.3 kg)     BMI 26.0   Current tobacco use? No  Labs:  Lipid Panel  No results found for: CHOL, TRIG, HDL, CHOLHDL, VLDL, LDLCALC, LDLDIRECT  No results found for: HGBA1C CBG (last 3)  No results for input(s): GLUCAP in the last 72 hours.  Nutrition Note Spoke with pt. Pt is aware of nutrition education classes offered.  Nutrition Diagnosis ? Food-and nutrition-related knowledge deficit related to lack of exposure to information as related to diagnosis of: ? CVD  Nutrition Intervention ? Pt's individual nutrition plan and goals reviewed with pt. ? Pt given handouts for: ? Nutrition I class ? Nutrition II class   Nutrition Goal(s):  ? Pt to identify and limit food sources of saturated fat, trans  fat, and sodium  Plan:  Pt to attend nutrition classes ? Portion Distortion  Will provide client-centered nutrition education as part of interdisciplinary care.   Monitor and evaluate progress toward nutrition goal with team.  Derek Mound, M.Ed, RD, LDN, CDE 08/13/2017 12:20 PM

## 2017-08-13 NOTE — Addendum Note (Signed)
Addended by: Michae Kava on: 08/13/2017 05:09 PM   Modules accepted: Orders

## 2017-08-13 NOTE — Progress Notes (Signed)
Cardiology Office Note:    Date:  08/13/2017   ID:  Walter Mt, [name], DOB 13-Jul-1946, MRN [id number]  PCP:  [name], [name]  Cardiologist:  Dr. [name]   Electrophysiologist: Dr. Virl Axe     Referring [name]: [name], [name]   Chief Complaint  Patient presents with  . Hospitalization Follow-up    s/p PCI    History of Present Illness:    Walter Mt, [name] is a 72 y.o. male with a hx of recurrent atrial arrhythmias status post several ablative procedures and cardioversions, pacemaker implantation, CAD s/p PCI with DES to LCx in 2013, dilated CM, s/p upgrade to CRT-P, HL with statin intolerance.  He was recently evaluated by Dr. Burt Knack for unstable angina.  Cardiac catheterization 07/21/17 demonstrated high-grade proximal LAD stenosis (95%), moderate nodular disease in the mid and distal vessel, patent stent in the LCx was chronic occlusion of the distal AV circumflex and moderate nonobstructive distal RCA stenosis. The patient would PCI with a DES to the LAD.  Recommendation was to continue triple therapy (aspirin, Plavix, Xarelto) 1 month, then discontinue aspirin and remain on combination of Plavix and Xarelto for total of 6 months.  Mr. Milliman return for post hospital follow-up. He has not really felt very well since his PCI.  He feels weak and notes worsening dizziness at times.  He has had chronic issues with dizziness and imbalance since he was on Tikosyn.  He denies any further chest pain or significant shortness of breath.  He has not had paroxysmal nocturnal dyspnea, edema.  He denies any bleeding issues.  His VS at cardiac rehabilitation earlier this week were optimal.    Prior CV studies:   The following studies were reviewed today:  Cardiac catheterization 07/21/17 LAD proximal 95, mid 50, distal 50 LCx stent patent with chronic occlusion of distal AV LCx RCA distal 50 EF 55-65 PCI:  3.5 x 15 mm Xience Sierra DES to the LAD  Echocardiogram 02/23/17 Mild LVH, EF  40-45, normal wall motion, trivial AI, aortic root 39 mm (mildly dilated), mild MR, moderate LAE, mild TR, PASP 35  Echocardiogram 11/06/16 EF 35-40, mildly dilated ascending aorta (40), mild MR, moderate LAE, mildly reduced RVSF, mild TR, mild PI  Echocardiogram 04/01/16 EF 40-45, mild LVH, aortic root 39 mm, MAC, mild MR, moderate LAE, moderate RAE, PASP 35  Nuclear stress test 12/05/15 Low risk stress nuclear study with no ischemia or infarction; EF 47 with septal hypokinesis; mild LVE.  Cardiac catheterization 7/13 PCI: Promus DES to the LCx  Echo 6/13 EF 95-63, grade 2 diastolic dysfunction, dilated aortic root (43), moderate LAE, PASP 26  Myoview 3/13 EF 62, small mildly partially reversible distal anterior defect consistent with soft tissue attenuation mild distal anterior ischemia  Past Medical History:  Diagnosis Date  . Atrial flutter -atypical 2007   ABLATION x2 [facility]  . Atrial tachycardia ([place]) 2007   ABLATION [facility]  . Benign paroxysmal vertigo   . CAD (coronary artery disease)    a. abnl mv 01/2012;  b. cath 05/23/2012 LCX 80@ bifurcation w/ OM, 95% @ AV groove, otw nonobs dzs, EF 55-65%;  . Carcinoma ([place])    skin cancer "off my chest"  . Familial tremor   . First degree AV block    had 4 ablasions that left py with heart block  . History of blood transfusion 2010   "2nd day after prostatectomy"  . Hyperlipidemia   . Hypertension   .  Incontinence    DECREASING INCONTINENCE RELATED TO PROSTATECTOMY.  . Migraines   . Presence of permanent cardiac pacemaker 2013 ?  Marland Kitchen Prostate cancer ([place])   . Pulsatile tinnitus   . Tachy-brady syndrome ([place])    a. developed after RFCA @ [facility] 2013-> MDT PPM;  b. s/p RA lead revision 05/2012    Past Surgical History:  Procedure Laterality Date  . ATRIAL ABLATION SURGERY  04/01/12   "4th time I had one"  . CARDIAC CATHETERIZATION    . CARDIOVERSION  12/03/2011   Procedure: CARDIOVERSION;  Surgeon: [name], [name];  Location: [place];  Service: Cardiovascular;  Laterality: [name];  . CARDIOVERSION  01/13/2012   Procedure: CARDIOVERSION;  Surgeon: Lelon Perla, [name];  Location: [place];  Service: Cardiovascular;  Laterality: [name];  . CARDIOVERSION  04/27/2012   Procedure: CARDIOVERSION;  Surgeon: Darlin Coco, [name];  Location: [place];  Service: Cardiovascular;  Laterality: [name];  . COLONOSCOPY    . COLONOSCOPY WITH PROPOFOL [name] 10/29/2016   Procedure: COLONOSCOPY WITH PROPOFOL;  Surgeon: Doran Stabler, [name];  Location: [facility];  Service: Gastroenterology;  Laterality: [name];  . CORONARY ANGIOPLASTY WITH STENT PLACEMENT  06/02/12   "1; first one ever"  . CORONARY STENT INTERVENTION [name] 07/21/2017   Procedure: CORONARY STENT INTERVENTION;  Surgeon: [name], [name];  Location: [place] [facility];  Service: Cardiovascular;  Laterality: [name];  . EP IMPLANTABLE DEVICE [name] 06/17/2016   Procedure: BiV Pacemaker upgrade;  Surgeon: [name], [name];  Location: [place] [facility];  Service: Cardiovascular;  Laterality: [name];  . HERNIA REPAIR  1980's  . INGUINAL HERNIA REPAIR  1945   left side  . INSERT / REPLACE / REMOVE PACEMAKER  03/2012   initial placement  . INSERT / REPLACE / REMOVE PACEMAKER  05/25/12   "atrial lead change"  . LEAD REVISION [name] 05/25/2012   Procedure: LEAD REVISION;  Surgeon: Evans Lance, [name];  [facility];  Service: Cardiovascular;  Laterality: [name];  . LEFT HEART CATH AND CORONARY ANGIOGRAPHY [name] 07/21/2017   Procedure: LEFT HEART CATH AND CORONARY ANGIOGRAPHY;  Surgeon: [name], [name];  Location: [place] [facility];  Service: Cardiovascular;  Laterality: [name];  . PERCUTANEOUS CORONARY STENT INTERVENTION (PCI-S) [name] 06/02/2012   Procedure: PERCUTANEOUS CORONARY STENT INTERVENTION (PCI-S);  Surgeon: Hillary Bow, [name];  Location[facility] C[facility] LAB;  Service: Cardiovascular;  Laterality: [name];  . PROSTATECTOMY  03/20/7321   COMPLICATED  . SHOULDER SURGERY     Bilateral; "boney spurs removed"  . SKIN  CANCER EXCISION     "in situ; right chest"  . TONSILLECTOMY AND ADENOIDECTOMY     "as a child"    Current Medications: Current Meds  Medication Sig  . acyclovir (ZOVIRAX) 400 MG tablet Take 400 mg by mouth as needed. Herpes flare on legs  . aspirin EC 81 MG tablet Take 81 mg by mouth daily.  . cholecalciferol (VITAMIN D) 1000 UNITS tablet Take 1,000 Units by mouth daily.  . clopidogrel (PLAVIX) 75 MG tablet Take 1 tablet (75 mg total) by mouth daily with breakfast.  . losartan (COZAAR) 25 MG tablet Take 1 tablet (25 mg total) by mouth 2 (two) times daily.  . nitroGLYCERIN (NITROSTAT) 0.4 MG SL tablet Place 0.4 mg under the tongue every 5 (five) minutes as needed for chest pain (x 3 tabs).  . propranolol (INDERAL) 10 MG tablet Take 10 mg by mouth 3 (three) times daily as needed. Palpitations  or benign central tremor   . vitamin B-12 (CYANOCOBALAMIN) 1000 MCG tablet Take 1,000 mcg by mouth daily.  Alveda Reasons 20 MG TABS tablet TAKE 1 TABLET(20 MG) BY MOUTH DAILY WITH DINNER     Allergies:   Ceclor [cefaclor]; Chlorhexidine; Statins; and Ace inhibitors   Social History   Social History  . Marital status: Married    Spouse name: [name]  . Number of children: [name]  . Years of education: [name]   Occupational History  . Physician     Retired - ENT   Social History Main Topics  . Smoking status: Never Smoker  . Smokeless tobacco: Never Used  . Alcohol use 1.5 oz/week    3 Standard drinks or equivalent per week     Comment: drinks alcohol at least once every 3 months 05/25/12 "last alcohol 02/2012"  . Drug use: No  . Sexual activity: Not Currently   Other Topics Concern  . None   Social History Narrative   Retired ENT     Family Hx: The patient's family history includes Colon cancer in his sister; Diabetes in his brother; Heart disease in his brother and father.  ROS:   Please see the history of present illness.    ROS All other systems reviewed and are  negative.   EKGs/Labs/Other Test Reviewed:    EKG:  EKG is  ordered today.  The ekg ordered today demonstrates ?V-paced, HR 76, TWI 3, aVF, QTc 470, no change  Recent Labs: 07/22/2017: BUN 9; Creatinine, Ser 0.94; Hemoglobin 12.7; Platelets 146; Potassium 4.1; Sodium 134   Recent Lipid Panel No results found for: CHOL, TRIG, HDL, CHOLHDL, LDLCALC, LDLDIRECT  Physical Exam:    VS:  BP 120/78   Pulse 76   Ht 6' (1.829 m)   Wt 192 lb (87.1 kg)   BMI 26.04 kg/m     Wt Readings from Last 3 Encounters:  08/13/17 192 lb (87.1 kg)  08/10/17 191 lb 12.8 oz (87 kg)  07/22/17 199 lb 1.2 oz (90.3 kg)     Physical Exam  Constitutional: He is oriented to person, place, and time. He appears well-developed and well-nourished.  HENT:  Head: Normocephalic and atraumatic.  Eyes: No scleral icterus.  Neck: No JVD present.  Cardiovascular: Normal rate and regular rhythm.   No murmur heard. Pulmonary/Chest: Effort normal. He has no rales.  Abdominal: Soft. There is no tenderness.  Musculoskeletal: He exhibits no edema or deformity (R wrist without hematoma; Radial pulse 2+).  Neurological: He is alert and oriented to person, place, and time.  Skin: Skin is warm and dry.  Psychiatric: He has a normal mood and affect.    ASSESSMENT:    1. Coronary artery disease involving native coronary artery of native heart without angina pectoris   2. Other fatigue   3. Cardiomyopathy, nonischemic ([place])   4. Atrial flutter, unspecified type ([place])   5. Hyperlipidemia, unspecified hyperlipidemia type   6. Biventricular cardiac pacemaker in situ    PLAN:    In order of problems listed above:  1. Coronary artery disease involving native coronary artery of native heart without angina pectoris History of DES to the LCx in 2013. Recent symptoms of unstable angina prompted cardiac catheterization demonstrating high-grade proximal LAD stenosis treated with a DES. He requires triple therapy with aspirin,  Plavix, Xarelto for 30 days. He can stop his aspirin after October 5. He has been feeling somewhat fatigued and has occasional dizziness. I will obtain a follow-up  BMET and CBC today. He has already enrolled in cardiac rehabilitation.  2. Other fatigue Question if symptoms are mainly related to deconditioning. Obtain CBC and BMET today. His device was interrogated.  He has been in AFlutter for some time now.  Therefore, I do not think that AFlutter is the cause of his symptoms.    3. Cardiomyopathy, nonischemic ([place]) Recent EF on echocardiogram 40-45%. EF appeared normal at cardiac catheterization. Continue ARB and beta blocker.  4. Atrial flutter, unspecified type [facility]) He has a history of multiple ablative procedures.  Device interrogation indicates continuous AFlutter. Continue Xarelto for anticoagulation.  5. Hyperlipidemia, unspecified hyperlipidemia type Intolerant to statins. He is agreeable to discuss PCSK-9 inhibitor therapy with our lipid clinic. I will make that referral.  6. Biventricular cardiac pacemaker in situ   Follow-up with EP as planned. As noted, his device was interrogated today.   Dispo:  Return in about 6 weeks (around 09/24/2017) for Routine Follow Up, w/ Dr. Burt Knack.   Medication Adjustments/Labs and Tests Ordered: Current medicines are reviewed at length with the patient today.  Concerns regarding medicines are outlined above.  Tests Ordered: Orders Placed This Encounter  Procedures  . Basic Metabolic Panel (BMET)  . CBC  . Ambulatory referral to Lipid Clinic  . EKG 12-Lead   Medication Changes: No orders of the defined types were placed in this encounter.   Signed, Richardson Dopp, PA-C  08/13/2017 2:20 PM    [facility] [address], [place], [place]  [address] Phone: [phone]; Fax: [phone]

## 2017-08-13 NOTE — Telephone Encounter (Signed)
-----   Message from Liliane Shi, [place] sent at 08/13/2017  4:50 PM EDT ----- Please call the patient Kidney function is normal.  The sodium is low.  The hemoglobin is normal. Fluid restrict: </=1500 cc per day. BMET in 1-2 weeks. Richardson Dopp, PA-C    08/13/2017 4:49 PM

## 2017-08-13 NOTE — Telephone Encounter (Signed)
Pt has been notified of lab results by phone with verbal understanding. Pt agreeable to repeat BMET 10/8. Pt thanked me for my call today.

## 2017-08-13 NOTE — Patient Instructions (Addendum)
Medication Instructions:  1.Last dose of Aspirin will be on August 20, 2017  Labwork: Today: BMET, CBC  Testing/Procedures: None ordered   Follow-Up: 1.Your physician recommends that you schedule a follow-up appointment in: Dr. Burt Knack on September 30, 2017 @ 11:40 am.   2. You are being referred to our lipid clinic to discuss PCSK9 options.  Any Other Special Instructions Will Be Listed Below (If Applicable).     If you need a refill on your cardiac medications before your next appointment, please call your pharmacy.

## 2017-08-16 ENCOUNTER — Encounter (HOSPITAL_COMMUNITY): Payer: Self-pay

## 2017-08-16 ENCOUNTER — Encounter (HOSPITAL_COMMUNITY)
Admission: RE | Admit: 2017-08-16 | Discharge: 2017-08-16 | Disposition: A | Discharge status: Home / Self Care | Payer: Medicare Other | Source: Ambulatory Visit | Attending: Cardiovascular Disease | Admitting: Cardiovascular Disease

## 2017-08-16 DIAGNOSIS — E785 Hyperlipidemia, unspecified: Secondary | ICD-10-CM | POA: Diagnosis not present

## 2017-08-16 DIAGNOSIS — I2510 Atherosclerotic heart disease of native coronary artery without angina pectoris: Secondary | ICD-10-CM | POA: Diagnosis not present

## 2017-08-16 DIAGNOSIS — Z7902 Long term (current) use of antithrombotics/antiplatelets: Secondary | ICD-10-CM | POA: Insufficient documentation

## 2017-08-16 DIAGNOSIS — I440 Atrioventricular block, first degree: Secondary | ICD-10-CM | POA: Diagnosis not present

## 2017-08-16 DIAGNOSIS — I10 Essential (primary) hypertension: Secondary | ICD-10-CM | POA: Diagnosis not present

## 2017-08-16 DIAGNOSIS — Z7901 Long term (current) use of anticoagulants: Secondary | ICD-10-CM | POA: Insufficient documentation

## 2017-08-16 DIAGNOSIS — Z7982 Long term (current) use of aspirin: Secondary | ICD-10-CM | POA: Diagnosis not present

## 2017-08-16 DIAGNOSIS — Z955 Presence of coronary angioplasty implant and graft: Secondary | ICD-10-CM | POA: Diagnosis present

## 2017-08-16 DIAGNOSIS — Z79899 Other long term (current) drug therapy: Secondary | ICD-10-CM | POA: Diagnosis not present

## 2017-08-16 NOTE — Progress Notes (Signed)
Daily Session Note  Patient Details  Name: Walter KIMBERLING, MD MRN: [id number] Date of Birth: 08-11-1946 Referring Provider:     CARDIAC REHAB PHASE II ORIENTATION from 08/10/2017 in [place]  Referring Provider  [name], MD      Encounter Date: 08/16/2017  Check In:     Session Check In - 08/16/17 0851      Check-In   Location [facility]   Staff Present Barnet Pall, RN, Deland Pretty, MS, ACSM CEP, Exercise Physiologist;Molly diVincenzo, MS, ACSM RCEP, Exercise Physiologist;Joann Rion, RN, BSN;Amber Fair, MS, ACSM RCEP, Exercise Physiologist   Supervising physician immediately available to respond to emergencies [facility] immediately available   Physician(s) Dr. Wendee Beavers   Medication changes reported     No   Fall or balance concerns reported    No   Tobacco Cessation No Change   Warm-up and Cool-down Performed as group-led instruction   Resistance Training Performed Yes   VAD Patient? No     Pain Assessment   Currently in Pain? No/denies   Multiple Pain Sites No      Capillary Blood Glucose: No results found for this or any previous visit (from the past 24 hour(s)).      Exercise Prescription Changes - 08/16/17 1600      Response to Exercise   Blood Pressure (Admit) 118/84   Blood Pressure (Exercise) 146/84   Blood Pressure (Exit) 102/70   Heart Rate (Admit) 69 bpm   Heart Rate (Exercise) 97 bpm   Heart Rate (Exit) 68 bpm   Rating of Perceived Exertion (Exercise) 12   Symptoms none   Duration Continue with 30 min of aerobic exercise without signs/symptoms of physical distress.   Intensity THRR unchanged     Progression   Progression Continue to progress workloads to maintain intensity without signs/symptoms of physical distress.   Average METs 2.9     Resistance Training   Training Prescription Yes   Weight 6lbs   Reps 10-15   Time 10 Minutes     Treadmill   MPH 2.8   Grade 0   Minutes 10   METs 3.14     Bike   Level 1   Minutes 10   METs 3.16     NuStep   Level 4   SPM 90   Minutes 10   METs 2.4      History  Smoking Status  . Never Smoker  Smokeless Tobacco  . Never Used    Goals Met:  Exercise tolerated well  Goals Unmet:  Not Applicable  Comments: Pt started cardiac rehab today.  Pt tolerated light exercise without difficulty. VSS, telemetry-sinus rhythm, asymptomatic.  Medication list reconciled. Pt denies barriers to medicaiton compliance.  PSYCHOSOCIAL ASSESSMENT:  PHQ-0. Pt exhibits positive coping skills, hopeful outlook with supportive family. No psychosocial needs identified at this time, no psychosocial interventions necessary.    Pt enjoys organizing events, making pottery, walking dogs. Pt personal goals are to recondition his muscle and resume structured exercise program.  Pt oriented to exercise equipment and routine.    Understanding verbalized.   Dr. Fransico Him is Medical Director for Cardiac Rehab at [facility].

## 2017-08-18 ENCOUNTER — Encounter (HOSPITAL_COMMUNITY)
Admission: RE | Admit: 2017-08-18 | Discharge: 2017-08-18 | Disposition: A | Discharge status: Home / Self Care | Payer: Medicare Other | Source: Ambulatory Visit | Attending: Cardiovascular Disease | Admitting: Cardiovascular Disease

## 2017-08-18 DIAGNOSIS — Z955 Presence of coronary angioplasty implant and graft: Secondary | ICD-10-CM

## 2017-08-20 ENCOUNTER — Encounter (HOSPITAL_COMMUNITY)
Admission: RE | Admit: 2017-08-20 | Discharge: 2017-08-20 | Disposition: A | Discharge status: Home / Self Care | Payer: Medicare Other | Source: Ambulatory Visit | Attending: Cardiovascular Disease | Admitting: Cardiovascular Disease

## 2017-08-20 DIAGNOSIS — Z955 Presence of coronary angioplasty implant and graft: Secondary | ICD-10-CM

## 2017-08-23 ENCOUNTER — Encounter (HOSPITAL_COMMUNITY)
Admission: RE | Admit: 2017-08-23 | Discharge: 2017-08-23 | Disposition: A | Discharge status: Home / Self Care | Payer: Medicare Other | Source: Ambulatory Visit | Attending: Cardiovascular Disease | Admitting: Cardiovascular Disease

## 2017-08-23 ENCOUNTER — Other Ambulatory Visit: Payer: Medicare Other | Admitting: *Deleted

## 2017-08-23 DIAGNOSIS — Z955 Presence of coronary angioplasty implant and graft: Secondary | ICD-10-CM | POA: Diagnosis not present

## 2017-08-23 DIAGNOSIS — E871 Hypo-osmolality and hyponatremia: Secondary | ICD-10-CM

## 2017-08-24 ENCOUNTER — Encounter (HOSPITAL_COMMUNITY): Payer: Self-pay

## 2017-08-24 LAB — BASIC METABOLIC PANEL
BUN/Creatinine Ratio: 13 (ref 10–24)
BUN: 14 mg/dL (ref 8–27)
CALCIUM: 9 mg/dL (ref 8.6–10.2)
CHLORIDE: 96 mmol/L (ref 96–106)
CO2: 24 mmol/L (ref 20–29)
CREATININE: 1.1 mg/dL (ref 0.76–1.27)
GFR calc Af Amer: 77 mL/min/{1.73_m2} (ref >59)
GFR calc non Af Amer: 66 mL/min/{1.73_m2} (ref >59)
Glucose: 92 mg/dL (ref 65–99)
Potassium: 4.8 mmol/L (ref 3.5–5.2)
Sodium: 134 mmol/L (ref 134–144)

## 2017-08-24 NOTE — Progress Notes (Signed)
Cardiac Individual Treatment Plan  Patient Details  Name: Walter BOUTELLE, MD MRN: [id number] Date of Birth: [birth year] Referring Provider:     CARDIAC REHAB PHASE II ORIENTATION from 08/10/2017 in [place]  Referring Provider  [name], MD      Initial Encounter Date:    CARDIAC REHAB PHASE II ORIENTATION from 08/10/2017 in [place]  Date  08/10/17  Referring Provider  [name], MD      Visit Diagnosis: S/P coronary artery stent placement 07/22/2017 [facility]  Patient's Home Medications on Admission:  Current Outpatient Prescriptions:  .  acyclovir (ZOVIRAX) 400 MG tablet, Take 400 mg by mouth as needed. Herpes flare on legs, Disp: , Rfl:  .  cholecalciferol (VITAMIN D) 1000 UNITS tablet, Take 1,000 Units by mouth daily., Disp: , Rfl:  .  clopidogrel (PLAVIX) 75 MG tablet, Take 1 tablet (75 mg total) by mouth daily with breakfast., Disp: 90 tablet, Rfl: 2 .  losartan (COZAAR) 25 MG tablet, Take 1 tablet (25 mg total) by mouth 2 (two) times daily., Disp: 60 tablet, Rfl: 6 .  nitroGLYCERIN (NITROSTAT) 0.4 MG SL tablet, Place 0.4 mg under the tongue every 5 (five) minutes as needed for chest pain (x 3 tabs)., Disp: , Rfl:  .  propranolol (INDERAL) 10 MG tablet, Take 10 mg by mouth 3 (three) times daily as needed. Palpitations or benign central tremor , Disp: , Rfl:  .  vitamin B-12 (CYANOCOBALAMIN) 1000 MCG tablet, Take 1,000 mcg by mouth daily., Disp: , Rfl:  .  XARELTO 20 MG TABS tablet, TAKE 1 TABLET(20 MG) BY MOUTH DAILY WITH DINNER, Disp: 90 tablet, Rfl: 1  Past Medical History: Past Medical History:  Diagnosis Date  . Atrial flutter -atypical 2007   ABLATION x2 [facility]  . Atrial tachycardia ([place]) 2007   ABLATION [facility]  . Benign paroxysmal vertigo   . CAD (coronary artery disease)    a. abnl mv 01/2012;  b. cath 05/23/2012 LCX 80@ bifurcation w/ OM, 95% @ AV groove, otw nonobs dzs, EF 55-65%;  . Carcinoma  ([place])    skin cancer "off my chest"  . Familial tremor   . First degree AV block    had 4 ablasions that left py with heart block  . History of blood transfusion 2010   "2nd day after prostatectomy"  . Hyperlipidemia   . Hypertension   . Incontinence    DECREASING INCONTINENCE RELATED TO PROSTATECTOMY.  . Migraines   . Presence of permanent cardiac pacemaker 2013 ?  Marland Kitchen Prostate cancer ([place])   . Pulsatile tinnitus   . Tachy-brady syndrome ([place])    a. developed after RFCA @ [facility] 2013-> MDT PPM;  b. s/p RA lead revision 05/2012    Tobacco Use: History  Smoking Status  . Never Smoker  Smokeless Tobacco  . Never Used    Labs: Recent Review Flowsheet Data    There is no flowsheet data to display.      Capillary Blood Glucose: No results found for: GLUCAP   Exercise Target Goals:    Exercise Program Goal: Individual exercise prescription set with THRR, safety & activity barriers. Participant demonstrates ability to understand and report RPE using BORG scale, to self-measure pulse accurately, and to acknowledge the importance of the exercise prescription.  Exercise Prescription Goal: Starting with aerobic activity 30 plus minutes a day, 3 days per week for initial exercise prescription. Provide home exercise prescription and guidelines that  participant acknowledges understanding prior to discharge.  Activity Barriers & Risk Stratification:     Activity Barriers & Cardiac Risk Stratification - 08/10/17 1408      Activity Barriers & Cardiac Risk Stratification   Activity Barriers None   Cardiac Risk Stratification Moderate      6 Minute Walk:     6 Minute Walk    Row Name 08/10/17 1417         6 Minute Walk   Phase Initial     Distance 1434 feet     Walk Time 6 minutes     # of Rest Breaks 0     MPH 2.72     METS 3.11     RPE 11     VO2 Peak 10.9     Symptoms No     Resting HR 69 bpm     Resting BP 128/84     Resting Oxygen Saturation  97 %     Max  Ex. HR 98 bpm     Max Ex. BP 122/82     2 Minute Post BP 124/82        Oxygen Initial Assessment:   Oxygen Re-Evaluation:   Oxygen Discharge (Final Oxygen Re-Evaluation):   Initial Exercise Prescription:     Initial Exercise Prescription - 08/10/17 1400      Date of Initial Exercise RX and Referring Provider   Date 08/10/17   Referring Provider [name], MD     Treadmill   MPH 2.8   Grade 0   Minutes 10   METs 3.14     Bike   Level 1   Minutes 10   METs 3.16     NuStep   Level 4   SPM 90   Minutes 10   METs 3.1     Prescription Details   Frequency (times per week) 3   Duration Progress to 30 minutes of continuous aerobic without signs/symptoms of physical distress     Intensity   THRR 40-80% of Max Heartrate 59-118   Ratings of Perceived Exertion 11-13   Perceived Dyspnea 0-4     Progression   Progression Continue to progress workloads to maintain intensity without signs/symptoms of physical distress.     Resistance Training   Training Prescription Yes   Weight 4lbs   Reps 10-15      Perform Capillary Blood Glucose checks as needed.  Exercise Prescription Changes:     Exercise Prescription Changes    Row Name 08/16/17 1600             Response to Exercise   Blood Pressure (Admit) 118/84       Blood Pressure (Exercise) 146/84       Blood Pressure (Exit) 102/70       Heart Rate (Admit) 69 bpm       Heart Rate (Exercise) 97 bpm       Heart Rate (Exit) 68 bpm       Rating of Perceived Exertion (Exercise) 12       Symptoms none       Duration Continue with 30 min of aerobic exercise without signs/symptoms of physical distress.       Intensity THRR unchanged         Progression   Progression Continue to progress workloads to maintain intensity without signs/symptoms of physical distress.       Average METs 2.9         Resistance Training   Training  Prescription Yes       Weight 6lbs       Reps 10-15       Time 10 Minutes          Treadmill   MPH 2.8       Grade 0       Minutes 10       METs 3.14         Bike   Level 1       Minutes 10       METs 3.16         NuStep   Level 4       SPM 90       Minutes 10       METs 2.4          Exercise Comments:     Exercise Comments    Row Name 08/16/17 1253           Exercise Comments pt was oriented to exercise equipment on 08/16/17. Pt responded well to exercise session; will continue to monitor exercise progression and activity levels.           Exercise Goals and Review:     Exercise Goals    Row Name 08/10/17 1409             Exercise Goals   Increase Physical Activity Yes       Intervention Provide advice, education, support and counseling about physical activity/exercise needs.;Develop an individualized exercise prescription for aerobic and resistive training based on initial evaluation findings, risk stratification, comorbidities and participant's personal goals.       Expected Outcomes Achievement of increased cardiorespiratory fitness and enhanced flexibility, muscular endurance and strength shown through measurements of functional capacity and personal statement of participant.       Increase Strength and Stamina Yes       Intervention Provide advice, education, support and counseling about physical activity/exercise needs.;Develop an individualized exercise prescription for aerobic and resistive training based on initial evaluation findings, risk stratification, comorbidities and participant's personal goals.       Expected Outcomes Achievement of increased cardiorespiratory fitness and enhanced flexibility, muscular endurance and strength shown through measurements of functional capacity and personal statement of participant.       Able to understand and use rate of perceived exertion (RPE) scale Yes       Intervention Provide education and explanation on how to use RPE scale       Expected Outcomes Long Term:  Able to use RPE to guide  intensity level when exercising independently;Short Term: Able to use RPE daily in rehab to express subjective intensity level       Knowledge and understanding of Target Heart Rate Range (THRR) Yes       Intervention Provide education and explanation of THRR including how the numbers were predicted and where they are located for reference       Expected Outcomes Short Term: Able to state/look up THRR;Short Term: Able to use daily as guideline for intensity in rehab;Long Term: Able to use THRR to govern intensity when exercising independently       Able to check pulse independently Yes       Intervention Provide education and demonstration on how to check pulse in carotid and radial arteries.;Review the importance of being able to check your own pulse for safety during independent exercise       Expected Outcomes Short Term: Able to explain why pulse checking  is important during independent exercise;Long Term: Able to check pulse independently and accurately       Understanding of Exercise Prescription Yes       Intervention Provide education, explanation, and written materials on patient's individual exercise prescription       Expected Outcomes Short Term: Able to explain program exercise prescription;Long Term: Able to explain home exercise prescription to exercise independently          Exercise Goals Re-Evaluation :     Exercise Goals Re-Evaluation    Row Name 08/24/17 1429             Exercise Goal Re-Evaluation   Exercise Goals Review Increase Physical Activity;Understanding of Exercise Prescription;Knowledge and understanding of Target Heart Rate Range (THRR);Able to understand and use rate of perceived exertion (RPE) scale;Increase Strength and Stamina       Comments Pt demonstrates use of RPE scale effectively and is making great progess in activity and exercise in cardiac rehab.       Expected Outcomes Pt will continue to exercise in cardiac rehab safely.           Discharge  Exercise Prescription (Final Exercise Prescription Changes):     Exercise Prescription Changes - 08/16/17 1600      Response to Exercise   Blood Pressure (Admit) 118/84   Blood Pressure (Exercise) 146/84   Blood Pressure (Exit) 102/70   Heart Rate (Admit) 69 bpm   Heart Rate (Exercise) 97 bpm   Heart Rate (Exit) 68 bpm   Rating of Perceived Exertion (Exercise) 12   Symptoms none   Duration Continue with 30 min of aerobic exercise without signs/symptoms of physical distress.   Intensity THRR unchanged     Progression   Progression Continue to progress workloads to maintain intensity without signs/symptoms of physical distress.   Average METs 2.9     Resistance Training   Training Prescription Yes   Weight 6lbs   Reps 10-15   Time 10 Minutes     Treadmill   MPH 2.8   Grade 0   Minutes 10   METs 3.14     Bike   Level 1   Minutes 10   METs 3.16     NuStep   Level 4   SPM 90   Minutes 10   METs 2.4      Nutrition:  Target Goals: Understanding of nutrition guidelines, daily intake of sodium 1500mg , cholesterol 200mg , calories 30% from fat and 7% or less from saturated fats, daily to have 5 or more servings of fruits and vegetables.  Biometrics:     Pre Biometrics - 08/10/17 1512      Pre Biometrics   Height 6' 0.45" (1.84 m)   Weight 191 lb 12.8 oz (87 kg)   Hip Circumference 39.75 inches   BMI (Calculated) 25.7   Triceps Skinfold 11 mm   % Body Fat 24.2 %   Grip Strength 53 kg   Flexibility 14 in   Single Leg Stand 4.92 seconds       Nutrition Therapy Plan and Nutrition Goals:     Nutrition Therapy & Goals - 08/13/17 1422      Nutrition Therapy   Diet Therapeutic Lifestyle Changes     Personal Nutrition Goals   Nutrition Goal Pt to identify and limit food sources of saturated fat, trans fat, and sodium     Intervention Plan   Intervention Prescribe, educate and counsel regarding individualized specific dietary modifications aiming towards  targeted core components such as weight, hypertension, lipid management, diabetes, heart failure and other comorbidities.   Expected Outcomes Short Term Goal: Understand basic principles of dietary content, such as calories, fat, sodium, cholesterol and nutrients.;Long Term Goal: Adherence to prescribed nutrition plan.      Nutrition Discharge: Nutrition Scores:     Nutrition Assessments - 08/13/17 1424      MEDFICTS Scores   Pre Score 9      Nutrition Goals Re-Evaluation:   Nutrition Goals Re-Evaluation:   Nutrition Goals Discharge (Final Nutrition Goals Re-Evaluation):   Psychosocial: Target Goals: Acknowledge presence or absence of significant depression and/or stress, maximize coping skills, provide positive support system. Participant is able to verbalize types and ability to use techniques and skills needed for reducing stress and depression.  Initial Review & Psychosocial Screening:     Initial Psych Review & Screening - 08/10/17 1401      Initial Review   Current issues with None Identified     Family Dynamics   Good Support System? Yes   Comments brief assessment shows good support mechanisms. No further interventions needed at this time     Barriers   Psychosocial barriers to participate in program There are no identifiable barriers or psychosocial needs.     Screening Interventions   Interventions Encouraged to exercise      Quality of Life Scores:     Quality of Life - 08/20/17 1524      Quality of Life Scores   Health/Function Pre 26.5 %   Socioeconomic Pre 27.86 %   Psych/Spiritual Pre 29.29 %   Family Pre 30 %   GLOBAL Pre 27.87 %  QOL scores reviewed with pt.  pt verbalizes no concerns. pt demonstrates positive outlook with good coping skills.      PHQ-9: Recent Review Flowsheet Data    Depression screen [facility] 2/9 08/16/2017 07/04/2012   Decreased Interest 0 0   Down, Depressed, Hopeless 0 0   PHQ - 2 Score 0 0     Interpretation of  Total Score  Total Score Depression Severity:  1-4 = Minimal depression, 5-9 = Mild depression, 10-14 = Moderate depression, 15-19 = Moderately severe depression, 20-27 = Severe depression   Psychosocial Evaluation and Intervention:     Psychosocial Evaluation - 08/16/17 1654      Psychosocial Evaluation & Interventions   Interventions Encouraged to exercise with the program and follow exercise prescription   Comments no psychosocial needs identified, no interventions necessary    Expected Outcomes pt will exhibit positive outlook with good coping skills.    Continue Psychosocial Services  No Follow up required      Psychosocial Re-Evaluation:     Psychosocial Re-Evaluation    [place] Name 08/20/17 1526             Psychosocial Re-Evaluation   Current issues with None Identified       Comments no psychosocial needs identified, no interventions necessary        Expected Outcomes pt will exhibit positive outlook with good coping skills.        Interventions Encouraged to attend Cardiac Rehabilitation for the exercise       Continue Psychosocial Services  No Follow up required          Psychosocial Discharge (Final Psychosocial Re-Evaluation):     Psychosocial Re-Evaluation - 08/20/17 1526      Psychosocial Re-Evaluation   Current issues with None Identified   Comments no psychosocial needs identified,  no interventions necessary    Expected Outcomes pt will exhibit positive outlook with good coping skills.    Interventions Encouraged to attend Cardiac Rehabilitation for the exercise   Continue Psychosocial Services  No Follow up required      Vocational Rehabilitation: Provide vocational rehab assistance to qualifying candidates.   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 Participant will state understanding/return demonstration of topics presented.  Learning Barriers/Preferences:     Learning Barriers/Preferences - 08/10/17 1357      Learning Barriers/Preferences   Learning Barriers Sight   Learning Preferences Audio;Group Instruction;Individual Instruction;Pictoral;Skilled Demonstration;Verbal Instruction;Video;Written Material      Education Topics: Count Your Pulse:  -Group instruction provided by verbal instruction, demonstration, patient participation and written materials to support subject.  Instructors address importance of being able to find your pulse and how to count your pulse when at home without a heart monitor.  Patients get hands on experience counting their pulse with staff help and individually.   Heart Attack, Angina, and Risk Factor Modification:  -Group instruction provided by verbal instruction, video, and written materials to support subject.  Instructors address signs and symptoms of angina and heart attacks.    Also discuss risk factors for heart disease and how to make changes to improve heart health risk factors.   Functional Fitness:  -Group instruction provided by verbal instruction, demonstration, patient participation, and written materials to support subject.  Instructors address safety measures for doing things around the house.  Discuss how to get up and down off the floor, how to pick things up properly, how to safely get out of a chair without assistance, and balance training.   Meditation and Mindfulness:  -Group instruction provided by verbal instruction, patient participation, and written materials to support subject.  Instructor addresses importance of mindfulness and meditation practice to help reduce stress and improve awareness.  Instructor also leads participants through a meditation exercise.     CARDIAC REHAB PHASE II EXERCISE from 08/20/2017 in [place]  Date  08/18/17  Instruction Review Code  2- meets goals/outcomes      Stretching for Flexibility and Mobility:  -Group instruction provided by verbal instruction, patient participation, and written materials to support subject.  Instructors lead participants through series of stretches that are designed to increase flexibility thus improving mobility.  These stretches are additional exercise for major muscle groups that are typically performed during regular warm up and cool down.   CARDIAC REHAB PHASE II EXERCISE from 08/20/2017 in [place]  Date  08/20/17  Instruction Review Code  2- meets goals/outcomes      Hands Only CPR:  -Group verbal, video, and participation provides a basic overview of AHA guidelines for community CPR. Role-play of emergencies allow participants the opportunity to practice calling for help and chest compression technique with discussion of AED use.   Hypertension: -Group verbal and written instruction that provides a basic overview of hypertension including the most recent diagnostic guidelines, risk factor reduction with self-care instructions and medication management.    Nutrition I class: Heart Healthy Eating:  -Group instruction provided by PowerPoint slides, verbal discussion, and written  materials to support subject matter. The instructor gives an explanation and review of the Therapeutic Lifestyle Changes diet recommendations, which includes a discussion on lipid goals, dietary fat, sodium, fiber, plant stanol/sterol esters, sugar, and the components of a well-balanced, healthy diet.   CARDIAC REHAB PHASE II EXERCISE from 08/20/2017 in [place]  Date  08/10/17  Educator  RD  Instruction Review Code  Not applicable      Nutrition II class: Lifestyle Skills:  -Group instruction provided by  PowerPoint slides, verbal discussion, and written materials to support subject matter. The instructor gives an explanation and review of label reading, grocery shopping for heart health, heart healthy recipe modifications, and ways to make healthier choices when eating out.   CARDIAC REHAB PHASE II EXERCISE from 08/20/2017 in [place]  Date  08/10/17  Educator  RD  Instruction Review Code  Not applicable      Diabetes Question & Answer:  -Group instruction provided by PowerPoint slides, verbal discussion, and written materials to support subject matter. The instructor gives an explanation and review of diabetes co-morbidities, pre- and post-prandial blood glucose goals, pre-exercise blood glucose goals, signs, symptoms, and treatment of hypoglycemia and hyperglycemia, and foot care basics.   Diabetes Blitz:  -Group instruction provided by PowerPoint slides, verbal discussion, and written materials to support subject matter. The instructor gives an explanation and review of the physiology behind type 1 and type 2 diabetes, diabetes medications and rational behind using different medications, pre- and post-prandial blood glucose recommendations and Hemoglobin A1c goals, diabetes diet, and exercise including blood glucose guidelines for exercising safely.    Portion Distortion:  -Group instruction provided by PowerPoint slides, verbal discussion, written materials, and food models to support subject matter. The instructor gives an explanation of serving size versus portion size, changes in portions sizes over the last 20 years, and what consists of a serving from each food group.   Stress Management:  -Group instruction provided by verbal instruction, video, and written materials to support subject matter.  Instructors review role of stress in heart disease and how to cope with stress positively.     Exercising on Your Own:  -Group instruction provided by verbal  instruction, power point, and written materials to support subject.  Instructors discuss benefits of exercise, components of exercise, frequency and intensity of exercise, and end points for exercise.  Also discuss use of nitroglycerin and activating EMS.  Review options of places to exercise outside of rehab.  Review guidelines for sex with heart disease.   Cardiac Drugs I:  -Group instruction provided by verbal instruction and written materials to support subject.  Instructor reviews cardiac drug classes: antiplatelets, anticoagulants, beta blockers, and statins.  Instructor discusses reasons, side effects, and lifestyle considerations for each drug class.   Cardiac Drugs II:  -Group instruction provided by verbal instruction and written materials to support subject.  Instructor reviews cardiac drug classes: angiotensin converting enzyme inhibitors (ACE-I), angiotensin II receptor blockers (ARBs), nitrates, and calcium channel blockers.  Instructor discusses reasons, side effects, and lifestyle considerations for each drug class.   Anatomy and Physiology of the Circulatory System:  Group verbal and written instruction and models provide basic cardiac anatomy and physiology, with the coronary electrical and arterial systems. Review of: AMI, Angina, Valve disease, Heart Failure, Peripheral Artery Disease, Cardiac Arrhythmia, Pacemakers, and the ICD.   Other Education:  -Group or individual verbal, written, or video instructions that support the educational goals  of the cardiac rehab program.   Knowledge Questionnaire Score:     Knowledge Questionnaire Score - 08/10/17 1413      Knowledge Questionnaire Score   Pre Score 23/24      Core Components/Risk Factors/Patient Goals at Admission:     Personal Goals and Risk Factors at Admission - 08/10/17 1357      Core Components/Risk Factors/Patient Goals on Admission   Hypertension Yes   Intervention Provide education on lifestyle  modifcations including regular physical activity/exercise, weight management, moderate sodium restriction and increased consumption of fresh fruit, vegetables, and low fat dairy, alcohol moderation, and smoking cessation.;Monitor prescription use compliance.   Lipids Yes   Intervention Provide education and support for participant on nutrition & aerobic/resistive exercise along with prescribed medications to achieve LDL 70mg , HDL >40mg .   Expected Outcomes Short Term: Participant states understanding of desired cholesterol values and is compliant with medications prescribed. Participant is following exercise prescription and nutrition guidelines.;Long Term: Cholesterol controlled with medications as prescribed, with individualized exercise RX and with personalized nutrition plan. Value goals: LDL < 70mg , HDL > 40 mg.      Core Components/Risk Factors/Patient Goals Review:      Goals and Risk Factor Review    Row Name 08/18/17 1059             Core Components/Risk Factors/Patient Goals Review   Personal Goals Review Hypertension;Lipids       Review pt with CAD RF demonstrates eagerness to participate in CR activities. pt demonstrates adequate BP managment at CR activities.        Expected Outcomes pt will participate in  CR exercise, nutrition and lifestyle modification activities to decrease overall RF.           Core Components/Risk Factors/Patient Goals at Discharge (Final Review):      Goals and Risk Factor Review - 08/18/17 1059      Core Components/Risk Factors/Patient Goals Review   Personal Goals Review Hypertension;Lipids   Review pt with CAD RF demonstrates eagerness to participate in CR activities. pt demonstrates adequate BP managment at CR activities.    Expected Outcomes pt will participate in  CR exercise, nutrition and lifestyle modification activities to decrease overall RF.       ITP Comments:     ITP Comments    Row Name 08/10/17 1337 08/24/17 1559          ITP Comments Dr Fransico Him, Medical Director 30 day ITP review.  pt with good participation and attendance.  continue current regimen unless therwise directed by medical director.          Comments:

## 2017-08-25 ENCOUNTER — Encounter (HOSPITAL_COMMUNITY)
Admission: RE | Admit: 2017-08-25 | Discharge: 2017-08-25 | Disposition: A | Discharge status: Home / Self Care | Payer: Medicare Other | Source: Ambulatory Visit | Attending: Cardiovascular Disease | Admitting: Cardiovascular Disease

## 2017-08-25 ENCOUNTER — Telehealth (HOSPITAL_COMMUNITY): Payer: Self-pay | Admitting: Cardiac Rehabilitation

## 2017-08-25 DIAGNOSIS — Z955 Presence of coronary angioplasty implant and graft: Secondary | ICD-10-CM | POA: Diagnosis not present

## 2017-08-25 NOTE — Progress Notes (Signed)
Reviewed home exercise with pt today.  Pt plans to walk or go to [employer] center for exercise.  Reviewed THR, pulse, RPE, sign and symptoms, NTG use, and when to call 911 or MD.  Also discussed weather considerations and indoor options.  Pt voiced understanding.   Adyline Huberty [employer]

## 2017-08-25 NOTE — Telephone Encounter (Signed)
pc to pt to discuss pt questions about losartan.  Pt reports he holds PM losartan if systolic ZY<346.  Pt BP stable at CR, average rest BP:  118/80, highest exercise BP:  146/84, lowest rest BP:  102/70.  Pt advised to continue this plan however discuss concerns or change in readings with Dr. Burt Knack Dr. Caryl Comes. Understanding verbalized.

## 2017-08-27 ENCOUNTER — Encounter (HOSPITAL_COMMUNITY)
Admission: RE | Admit: 2017-08-27 | Discharge: 2017-08-27 | Disposition: A | Discharge status: Home / Self Care | Payer: Medicare Other | Source: Ambulatory Visit | Attending: Cardiovascular Disease | Admitting: Cardiovascular Disease

## 2017-08-27 DIAGNOSIS — Z955 Presence of coronary angioplasty implant and graft: Secondary | ICD-10-CM | POA: Diagnosis not present

## 2017-08-30 ENCOUNTER — Encounter (HOSPITAL_COMMUNITY)
Admission: RE | Admit: 2017-08-30 | Discharge: 2017-08-30 | Disposition: A | Discharge status: Home / Self Care | Payer: Medicare Other | Source: Ambulatory Visit | Attending: Cardiovascular Disease | Admitting: Cardiovascular Disease

## 2017-08-30 DIAGNOSIS — Z955 Presence of coronary angioplasty implant and graft: Secondary | ICD-10-CM | POA: Diagnosis not present

## 2017-09-01 ENCOUNTER — Encounter (HOSPITAL_COMMUNITY)
Admission: RE | Admit: 2017-09-01 | Discharge: 2017-09-01 | Disposition: A | Discharge status: Home / Self Care | Payer: Medicare Other | Source: Ambulatory Visit | Attending: Cardiovascular Disease | Admitting: Cardiovascular Disease

## 2017-09-01 DIAGNOSIS — Z955 Presence of coronary angioplasty implant and graft: Secondary | ICD-10-CM

## 2017-09-03 ENCOUNTER — Encounter (HOSPITAL_COMMUNITY)
Admission: RE | Admit: 2017-09-03 | Discharge: 2017-09-03 | Disposition: A | Discharge status: Home / Self Care | Payer: Medicare Other | Source: Ambulatory Visit | Attending: Cardiovascular Disease | Admitting: Cardiovascular Disease

## 2017-09-03 DIAGNOSIS — Z955 Presence of coronary angioplasty implant and graft: Secondary | ICD-10-CM

## 2017-09-06 ENCOUNTER — Encounter (HOSPITAL_COMMUNITY)
Admission: RE | Admit: 2017-09-06 | Discharge: 2017-09-06 | Disposition: A | Discharge status: Home / Self Care | Payer: Medicare Other | Source: Ambulatory Visit | Attending: Cardiovascular Disease | Admitting: Cardiovascular Disease

## 2017-09-06 DIAGNOSIS — Z955 Presence of coronary angioplasty implant and graft: Secondary | ICD-10-CM

## 2017-09-07 ENCOUNTER — Ambulatory Visit: Payer: Medicare Other

## 2017-09-08 ENCOUNTER — Telehealth (HOSPITAL_COMMUNITY): Payer: Self-pay | Admitting: Cardiac Rehabilitation

## 2017-09-08 ENCOUNTER — Encounter (HOSPITAL_COMMUNITY)
Admission: RE | Admit: 2017-09-08 | Discharge: 2017-09-08 | Disposition: A | Discharge status: Home / Self Care | Payer: Medicare Other | Source: Ambulatory Visit | Attending: Cardiovascular Disease | Admitting: Cardiovascular Disease

## 2017-09-08 DIAGNOSIS — Z955 Presence of coronary angioplasty implant and graft: Secondary | ICD-10-CM

## 2017-09-08 NOTE — Progress Notes (Signed)
[name], MD 71 y.o. male DOB: [birth year] MRN: [id number]      Nutrition Note  Dx: s/p DES LAD  Note Spoke with pt. Nutrition plan and survey reviewed with pt. Pt is following Step 2 of the Therapeutic Lifestyle Changes diet. Pt states he was losing about 1 lb/week so he has increased his energy consumption and has gained a few lbs back. Pt expressed understanding of the information reviewed. Pt aware of nutrition education classes offered.  Nutrition Diagnosis ? Food-and nutrition-related knowledge deficit related to lack of exposure to information as related to diagnosis of: ? CVD  Nutrition Intervention ? Pt's individual nutrition plan and goals reviewed with pt. ? Benefits of adopting Therapeutic Lifestyle Changes discussed when Medficts reviewed.    Nutrition Goal(s):  ? Pt to identify and limit food sources of saturated fat, trans fat, and sodium  Plan:  Pt to attend nutrition classes ? Portion Distortion - met 09/01/17 Will provide client-centered nutrition education as part of interdisciplinary care.   Monitor and evaluate progress toward nutrition goal with team.  Derek Mound, M.Ed, RD, LDN, CDE 09/08/2017 9:46 AM

## 2017-09-08 NOTE — Telephone Encounter (Signed)
-----   Message from [name], MD sent at 08/25/2017 11:12 PM EDT ----- Regarding: RE: cardiac rehab  thx Kaesha Kirsch. This sounds fine to me. Ronalee Belts ----- Message ----- From: Lowell Guitar, RN Sent: 08/25/2017   5:24 PM To: [name], MD Subject: cardiac rehab                                  Dear Dr. Burt Knack,  Pt reports he holds PM losartan if systolic XY<585.   Pt BP stable at CR:    average rest BP:  118/80,  highest exercise BP:  146/84,  lowest rest BP:  102/70.   Pt advised to continue this plan however discuss concerns or change in readings with you or Dr. Caryl Comes.    Please advise.  Thank you, Andi Hence, RN, BSN Cardiac Pulmonary Rehab

## 2017-09-10 ENCOUNTER — Encounter (HOSPITAL_COMMUNITY)
Admission: RE | Admit: 2017-09-10 | Discharge: 2017-09-10 | Disposition: A | Discharge status: Home / Self Care | Payer: Medicare Other | Source: Ambulatory Visit | Attending: Cardiovascular Disease | Admitting: Cardiovascular Disease

## 2017-09-10 DIAGNOSIS — Z955 Presence of coronary angioplasty implant and graft: Secondary | ICD-10-CM | POA: Diagnosis not present

## 2017-09-12 NOTE — Progress Notes (Addendum)
Patient ID: Walter Mt, [name]                 DOB: April 17, 1946                    MRN: [id number]     HPI: Walter Mt, [name] is a 71 y.o. male patient of Dr Burt Knack referred to lipid clinic by Richardson Dopp, PA-C. PMH is significant for atrial flutter s/p several ablative procedures and cardioversions, tachy-brady syndrome, CAD s/p DES to LCx (2013) and LAD (2018), dilated cardiomyopathy (EF 55%) s/p CRT-P, HTN, and hyperlipidemia.   Pt has reported history of statin intolerance and last known lipid panel in 2015 showed LDL of 137 mg/dl. Pt states he has not been on a statin in ~15 years. He does not remember exactly what statin or doses he has tried, but had tried 3 different statins and believes they were rosuvastatin, atorvastatin, and simvastatin. He reports significant muscle weakness that developed 2 weeks after starting therapy with all 3 statins, which resolved several months after stopping therapy. The weakness was severe enough to limit his mobility and prevent him from exercising. Pt also took red yeast rice which caused myalgias, and use of coenzyme Q10 did not help prevent or minimize symptoms. Pt reports he would be able to get PCSK9 inhibitors covered by the [facility] health system and requests a letter be sent with our recommendations to his [facility] PCP.  Current Medications: None  Intolerances: Per patient, he has tried 3 statins previously and thinks it was simvastatin, atorvastatin, and rosuvastatin - all of which caused severe muscle weakness after 2 weeks.  Risk Factors: Hx of CAD s/p DES, HTN, HLD; FH of CAD  LDL goal: < 70 mg/dl  Diet: Pt recently has cut out fats and starting eating more carbohydrates. Endorses eating 3 servings of fruits/vegetables a day, and mostly eating lean protein such as chicken, fish, or beans.  Exercise: Currently enrolled in cardiac rehab 3x/week  Family History: colon cancer (sister), DM (brother), CAD (brother, father)  Social History: Never smoker, 3  alcoholic drinks/week  Labs: Lipid Panel (03/2014): TC 211, TG 54, LDL 137, HDL 63 (on no lipid lowering therapy)  Past Medical History:  Diagnosis Date  . Atrial flutter -atypical 2007   ABLATION x2 [facility]  . Atrial tachycardia ([place]) 2007   ABLATION [facility]  . Benign paroxysmal vertigo   . CAD (coronary artery disease)    a. abnl mv 01/2012;  b. cath 05/23/2012 LCX 80@ bifurcation w/ OM, 95% @ AV groove, otw nonobs dzs, EF 55-65%;  . Carcinoma ([place])    skin cancer "off my chest"  . Familial tremor   . First degree AV block    had 4 ablasions that left py with heart block  . History of blood transfusion 2010   "2nd day after prostatectomy"  . Hyperlipidemia   . Hypertension   . Incontinence    DECREASING INCONTINENCE RELATED TO PROSTATECTOMY.  . Migraines   . Presence of permanent cardiac pacemaker 2013 ?  Marland Kitchen Prostate cancer ([place])   . Pulsatile tinnitus   . Tachy-brady syndrome ([place])    a. developed after RFCA @ [facility] 2013-> MDT PPM;  b. s/p RA lead revision 05/2012    Current Outpatient Prescriptions on File Prior to Visit  Medication Sig Dispense Refill  . acyclovir (ZOVIRAX) 400 MG tablet Take 400 mg by mouth as needed. Herpes flare on legs    . cholecalciferol (VITAMIN D)  1000 UNITS tablet Take 1,000 Units by mouth daily.    . clopidogrel (PLAVIX) 75 MG tablet Take 1 tablet (75 mg total) by mouth daily with breakfast. 90 tablet 2  . losartan (COZAAR) 25 MG tablet Take 1 tablet (25 mg total) by mouth 2 (two) times daily. 60 tablet 6  . nitroGLYCERIN (NITROSTAT) 0.4 MG SL tablet Place 0.4 mg under the tongue every 5 (five) minutes as needed for chest pain (x 3 tabs).    . propranolol (INDERAL) 10 MG tablet Take 10 mg by mouth 3 (three) times daily as needed. Palpitations or benign central tremor     . vitamin B-12 (CYANOCOBALAMIN) 1000 MCG tablet Take 1,000 mcg by mouth daily.    Alveda Reasons 20 MG TABS tablet TAKE 1 TABLET(20 MG) BY MOUTH DAILY WITH DINNER 90 tablet 1   No current  facility-administered medications on file prior to visit.     Allergies  Allergen Reactions  . Ceclor [Cefaclor] Hives  . Chlorhexidine Itching and Rash  . Statins Other (See Comments)    Muscle weakness  . Ace Inhibitors Other (See Comments)    Does not take due to "angiotensin is responsible for maintaining bladder sphincter tone"    Assessment/Plan:  1. Hyperlipidemia - LDL uncontrolled on previous lipid panel in 2015 in a pt with history of multiple statin intolerances. Will repeat lipid panel and direct LDL today as patient has not been fasting. Discussed with patient that current options include retrying a low-dose statin, starting Zetia, or starting a PCSK9 inhibitor. Pt would prefer not to restart statin therapy today given severity of previous myalgias. Will reassess repeat LDL and if LDL is close to goal of 70mg /dl would recommend starting Zetia, but if LDL still significantly above goal would recommend starting PCSK9 inhibitor. Will call pt once results are available - he prefers that we mail a letter to him to bring in to the [place] for PCSK9i approval.  Arrie Senate, PharmD PGY-2 Cardiology Pharmacy Resident Pager: [phone] 09/13/2017   Addendum: Direct LDL back at 130 above goal < 70. Will submit letter of medical necessity to the [place] to see if they will cover Praluent therapy.

## 2017-09-13 ENCOUNTER — Encounter (HOSPITAL_COMMUNITY)
Admission: RE | Admit: 2017-09-13 | Discharge: 2017-09-13 | Disposition: A | Discharge status: Home / Self Care | Payer: Medicare Other | Source: Ambulatory Visit | Attending: Cardiovascular Disease | Admitting: Cardiovascular Disease

## 2017-09-13 ENCOUNTER — Ambulatory Visit (INDEPENDENT_AMBULATORY_CARE_PROVIDER_SITE_OTHER): Payer: Medicare Other | Admitting: Pharmacist

## 2017-09-13 DIAGNOSIS — E7849 Other hyperlipidemia: Secondary | ICD-10-CM

## 2017-09-13 DIAGNOSIS — I25119 Atherosclerotic heart disease of native coronary artery with unspecified angina pectoris: Secondary | ICD-10-CM

## 2017-09-13 DIAGNOSIS — Z955 Presence of coronary angioplasty implant and graft: Secondary | ICD-10-CM | POA: Diagnosis not present

## 2017-09-13 NOTE — Patient Instructions (Signed)
It was great to meet you today.  We will be in touch with your lipid panel results.

## 2017-09-14 LAB — LIPID PANEL
CHOLESTEROL TOTAL: 191 mg/dL (ref 100–199)
Chol/HDL Ratio: 2.9 ratio (ref 0.0–5.0)
HDL: 65 mg/dL (ref >39)
LDL CALC: 115 mg/dL — AB (ref 0–99)
TRIGLYCERIDES: 55 mg/dL (ref 0–149)
VLDL Cholesterol Cal: 11 mg/dL (ref 5–40)

## 2017-09-14 LAB — LDL CHOLESTEROL, DIRECT: LDL Direct: 130 mg/dL — ABNORMAL HIGH (ref 0–99)

## 2017-09-15 ENCOUNTER — Encounter (HOSPITAL_COMMUNITY)
Admission: RE | Admit: 2017-09-15 | Discharge: 2017-09-15 | Disposition: A | Discharge status: Home / Self Care | Payer: Medicare Other | Source: Ambulatory Visit | Attending: Cardiovascular Disease | Admitting: Cardiovascular Disease

## 2017-09-17 ENCOUNTER — Encounter (HOSPITAL_COMMUNITY)
Admission: RE | Admit: 2017-09-17 | Discharge: 2017-09-17 | Disposition: A | Discharge status: Home / Self Care | Payer: Medicare Other | Source: Ambulatory Visit | Attending: Cardiovascular Disease | Admitting: Cardiovascular Disease

## 2017-09-17 DIAGNOSIS — I10 Essential (primary) hypertension: Secondary | ICD-10-CM | POA: Diagnosis not present

## 2017-09-17 DIAGNOSIS — Z7982 Long term (current) use of aspirin: Secondary | ICD-10-CM | POA: Insufficient documentation

## 2017-09-17 DIAGNOSIS — I2510 Atherosclerotic heart disease of native coronary artery without angina pectoris: Secondary | ICD-10-CM | POA: Diagnosis not present

## 2017-09-17 DIAGNOSIS — E785 Hyperlipidemia, unspecified: Secondary | ICD-10-CM | POA: Insufficient documentation

## 2017-09-17 DIAGNOSIS — Z7901 Long term (current) use of anticoagulants: Secondary | ICD-10-CM | POA: Insufficient documentation

## 2017-09-17 DIAGNOSIS — Z955 Presence of coronary angioplasty implant and graft: Secondary | ICD-10-CM | POA: Diagnosis not present

## 2017-09-17 DIAGNOSIS — Z79899 Other long term (current) drug therapy: Secondary | ICD-10-CM | POA: Insufficient documentation

## 2017-09-17 DIAGNOSIS — Z7902 Long term (current) use of antithrombotics/antiplatelets: Secondary | ICD-10-CM | POA: Diagnosis not present

## 2017-09-17 DIAGNOSIS — I440 Atrioventricular block, first degree: Secondary | ICD-10-CM | POA: Insufficient documentation

## 2017-09-17 NOTE — Progress Notes (Signed)
Cardiac Individual Treatment Plan  Patient Details  Name: Walter BOTTOMLEY, [name] MRN: [id number] Date of Birth: January 06, 1946 Referring Provider:     CARDIAC REHAB PHASE II ORIENTATION from 08/10/2017 in [place]  Referring Provider  [name], [name]      Initial Encounter Date:    CARDIAC REHAB PHASE II ORIENTATION from 08/10/2017 in [facility]  Date  08/10/17  Referring Provider  [name], [name]      Visit Diagnosis: S/P coronary artery stent placement 07/22/2017 [facility]  Patient's Home Medications on Admission:  Current Outpatient Prescriptions:  .  acyclovir (ZOVIRAX) 400 MG tablet, Take 400 mg by mouth as needed. Herpes flare on legs, Disp: , Rfl:  .  cholecalciferol (VITAMIN D) 1000 UNITS tablet, Take 1,000 Units by mouth daily., Disp: , Rfl:  .  clopidogrel (PLAVIX) 75 MG tablet, Take 1 tablet (75 mg total) by mouth daily with breakfast., Disp: 90 tablet, Rfl: 2 .  losartan (COZAAR) 25 MG tablet, Take 1 tablet (25 mg total) by mouth 2 (two) times daily., Disp: 60 tablet, Rfl: 6 .  nitroGLYCERIN (NITROSTAT) 0.4 MG SL tablet, Place 0.4 mg under the tongue every 5 (five) minutes as needed for chest pain (x 3 tabs)., Disp: , Rfl:  .  propranolol (INDERAL) 10 MG tablet, Take 10 mg by mouth 3 (three) times daily as needed. Palpitations or benign central tremor , Disp: , Rfl:  .  vitamin B-12 (CYANOCOBALAMIN) 1000 MCG tablet, Take 1,000 mcg by mouth daily., Disp: , Rfl:  .  XARELTO 20 MG TABS tablet, TAKE 1 TABLET(20 MG) BY MOUTH DAILY WITH DINNER, Disp: 90 tablet, Rfl: 1  Past Medical History: Past Medical History:  Diagnosis Date  . Atrial flutter -atypical 2007   ABLATION x2 [facility]  . Atrial tachycardia ([place]) 2007   ABLATION [facility]  . Benign paroxysmal vertigo   . CAD (coronary artery disease)    a. abnl mv 01/2012;  b. cath 05/23/2012 LCX 80@ bifurcation w/ OM, 95% @ AV groove, otw nonobs dzs, EF 55-65%;  . Carcinoma  ([place])    skin cancer "off my chest"  . Familial tremor   . First degree AV block    had 4 ablasions that left py with heart block  . History of blood transfusion 2010   "2nd day after prostatectomy"  . Hyperlipidemia   . Hypertension   . Incontinence    DECREASING INCONTINENCE RELATED TO PROSTATECTOMY.  . Migraines   . Presence of permanent cardiac pacemaker 2013 ?  Marland Kitchen Prostate cancer ([place])   . Pulsatile tinnitus   . Tachy-brady syndrome ([place])    a. developed after RFCA @ [facility] 2013-> MDT PPM;  b. s/p RA lead revision 05/2012    Tobacco Use: History  Smoking Status  . Never Smoker  Smokeless Tobacco  . Never Used    Labs: Recent Review Flowsheet Data    Labs for ITP Cardiac and Pulmonary Rehab Latest Ref Rng & Units 09/13/2017   Cholestrol 100 - 199 mg/dL 191   LDLCALC 0 - 99 mg/dL 115(H)   LDLDIRECT 0 - 99 mg/dL 130(H)   HDL >39 mg/dL 65   Trlycerides 0 - 149 mg/dL 55      Capillary Blood Glucose: No results found for: GLUCAP   Exercise Target Goals:    Exercise Program Goal: Individual exercise prescription set with THRR, safety & activity barriers. Participant demonstrates ability to understand and report RPE  using BORG scale, to self-measure pulse accurately, and to acknowledge the importance of the exercise prescription.  Exercise Prescription Goal: Starting with aerobic activity 30 plus minutes a day, 3 days per week for initial exercise prescription. Provide home exercise prescription and guidelines that participant acknowledges understanding prior to discharge.  Activity Barriers & Risk Stratification:     Activity Barriers & Cardiac Risk Stratification - 08/10/17 1408      Activity Barriers & Cardiac Risk Stratification   Activity Barriers None   Cardiac Risk Stratification Moderate      6 Minute Walk:     6 Minute Walk    Row Name 08/10/17 1417         6 Minute Walk   Phase Initial     Distance 1434 feet     Walk Time 6 minutes     # of  Rest Breaks 0     MPH 2.72     METS 3.11     RPE 11     VO2 Peak 10.9     Symptoms No     Resting HR 69 bpm     Resting BP 128/84     Resting Oxygen Saturation  97 %     Max Ex. HR 98 bpm     Max Ex. BP 122/82     2 Minute Post BP 124/82        Oxygen Initial Assessment:   Oxygen Re-Evaluation:   Oxygen Discharge (Final Oxygen Re-Evaluation):   Initial Exercise Prescription:     Initial Exercise Prescription - 08/10/17 1400      Date of Initial Exercise RX and Referring Provider   Date 08/10/17   Referring Provider [name], [name]     Treadmill   MPH 2.8   Grade 0   Minutes 10   METs 3.14     Bike   Level 1   Minutes 10   METs 3.16     NuStep   Level 4   SPM 90   Minutes 10   METs 3.1     Prescription Details   Frequency (times per week) 3   Duration Progress to 30 minutes of continuous aerobic without signs/symptoms of physical distress     Intensity   THRR 40-80% of Max Heartrate 59-118   Ratings of Perceived Exertion 11-13   Perceived Dyspnea 0-4     Progression   Progression Continue to progress workloads to maintain intensity without signs/symptoms of physical distress.     Resistance Training   Training Prescription Yes   Weight 4lbs   Reps 10-15      Perform Capillary Blood Glucose checks as needed.  Exercise Prescription Changes:     Exercise Prescription Changes    Row Name 08/16/17 1600 09/06/17 1500           Response to Exercise   Blood Pressure (Admit) 118/84 120/80      Blood Pressure (Exercise) 146/84 132/80      Blood Pressure (Exit) 102/70 112/70      Heart Rate (Admit) 69 bpm 76 bpm      Heart Rate (Exercise) 97 bpm 108 bpm      Heart Rate (Exit) 68 bpm 78 bpm      Rating of Perceived Exertion (Exercise) 12 12      Symptoms none none      Duration Continue with 30 min of aerobic exercise without signs/symptoms of physical distress. Continue with 30 min of aerobic exercise without  signs/symptoms of physical  distress.      Intensity THRR unchanged THRR unchanged        Progression   Progression Continue to progress workloads to maintain intensity without signs/symptoms of physical distress. Continue to progress workloads to maintain intensity without signs/symptoms of physical distress.      Average METs 2.9 3.4        Resistance Training   Training Prescription Yes Yes      Weight 6lbs 6lbs      Reps 10-15 10-15      Time 10 Minutes 10 Minutes        Treadmill   MPH 2.8 3      Grade 0 1      Minutes 10 10      METs 3.14 3.71        Bike   Level 1 1      Minutes 10 10      METs 3.16 3.2        NuStep   Level 4 4      SPM 90 90      Minutes 10 10      METs 2.4 3.4        Home Exercise Plan   Plans to continue exercise at  - [employer] (comment)  [place] club or outdoor walking      Frequency  - Add 2 additional days to program exercise sessions.      Initial Home Exercises Provided  - 08/25/17         Exercise Comments:     Exercise Comments    Row Name 08/16/17 1253 09/14/17 1411         Exercise Comments pt was oriented to exercise equipment on 08/16/17. Pt responded well to exercise session; will continue to monitor exercise progression and activity levels.  Reviewed METs and goals. Pt is tolerating exercise well; will continue to monitor pt's progress and activity levels.          Exercise Goals and Review:     Exercise Goals    Row Name 08/10/17 1409             Exercise Goals   Increase Physical Activity Yes       Intervention Provide advice, education, support and counseling about physical activity/exercise needs.;Develop an individualized exercise prescription for aerobic and resistive training based on initial evaluation findings, risk stratification, comorbidities and participant's personal goals.       Expected Outcomes Achievement of increased cardiorespiratory fitness and enhanced flexibility, muscular endurance and strength  shown through measurements of functional capacity and personal statement of participant.       Increase Strength and Stamina Yes       Intervention Provide advice, education, support and counseling about physical activity/exercise needs.;Develop an individualized exercise prescription for aerobic and resistive training based on initial evaluation findings, risk stratification, comorbidities and participant's personal goals.       Expected Outcomes Achievement of increased cardiorespiratory fitness and enhanced flexibility, muscular endurance and strength shown through measurements of functional capacity and personal statement of participant.       Able to understand and use rate of perceived exertion (RPE) scale Yes       Intervention Provide education and explanation on how to use RPE scale       Expected Outcomes Long Term:  Able to use RPE to guide intensity level when exercising independently;Short Term: Able to use RPE daily in rehab to express  subjective intensity level       Knowledge and understanding of Target Heart Rate Range (THRR) Yes       Intervention Provide education and explanation of THRR including how the numbers were predicted and where they are located for reference       Expected Outcomes Short Term: Able to state/look up THRR;Short Term: Able to use daily as guideline for intensity in rehab;Long Term: Able to use THRR to govern intensity when exercising independently       Able to check pulse independently Yes       Intervention Provide education and demonstration on how to check pulse in carotid and radial arteries.;Review the importance of being able to check your own pulse for safety during independent exercise       Expected Outcomes Short Term: Able to explain why pulse checking is important during independent exercise;Long Term: Able to check pulse independently and accurately       Understanding of Exercise Prescription Yes       Intervention Provide education,  explanation, and written materials on patient's individual exercise prescription       Expected Outcomes Short Term: Able to explain program exercise prescription;Long Term: Able to explain home exercise prescription to exercise independently          Exercise Goals Re-Evaluation :     Exercise Goals Re-Evaluation    Row Name 08/24/17 1429 08/25/17 1034 09/14/17 1411         Exercise Goal Re-Evaluation   Exercise Goals Review Increase Physical Activity;Understanding of Exercise Prescription;Knowledge and understanding of Target Heart Rate Range (THRR);Able to understand and use rate of perceived exertion (RPE) scale;Increase Strength and Stamina Increase Physical Activity;Able to understand and use rate of perceived exertion (RPE) scale;Knowledge and understanding of Target Heart Rate Range (THRR);Understanding of Exercise Prescription;Increase Strength and Stamina;Able to check pulse independently Increase Physical Activity;Able to understand and use rate of perceived exertion (RPE) scale;Knowledge and understanding of Target Heart Rate Range (THRR);Understanding of Exercise Prescription;Increase Strength and Stamina;Able to check pulse independently     Comments Pt demonstrates use of RPE scale effectively and is making great progess in activity and exercise in cardiac rehab. Reviewed home exercise with pt today.  Pt plans to walk or go to [employer] center for exercise.  Reviewed THR, pulse, RPE, sign and symptoms, NTG use, and when to call 911 or [name].  Also discussed weather considerations and indoor options.  Pt voiced understanding. pt is getting back on track with HEP at the gym. Discussed importance of 150 minutes a week of cardiovascular fitness to improve heart health. Pt voiced understanding.     Expected Outcomes Pt will continue to exercise in cardiac rehab safely. Pt will be compliant with HEP and improve on cardiorespiratory fitness. Pt will be compliant with HEP  and improve on cardiorespiratory fitness.         Discharge Exercise Prescription (Final Exercise Prescription Changes):     Exercise Prescription Changes - 09/06/17 1500      Response to Exercise   Blood Pressure (Admit) 120/80   Blood Pressure (Exercise) 132/80   Blood Pressure (Exit) 112/70   Heart Rate (Admit) 76 bpm   Heart Rate (Exercise) 108 bpm   Heart Rate (Exit) 78 bpm   Rating of Perceived Exertion (Exercise) 12   Symptoms none   Duration Continue with 30 min of aerobic exercise without signs/symptoms of physical distress.   Intensity THRR unchanged     Progression  Progression Continue to progress workloads to maintain intensity without signs/symptoms of physical distress.   Average METs 3.4     Resistance Training   Training Prescription Yes   Weight 6lbs   Reps 10-15   Time 10 Minutes     Treadmill   MPH 3   Grade 1   Minutes 10   METs 3.71     Bike   Level 1   Minutes 10   METs 3.2     NuStep   Level 4   SPM 90   Minutes 10   METs 3.4     Home Exercise Plan   Plans to continue exercise at [employer] (comment)  [place] country club or outdoor walking   Frequency Add 2 additional days to program exercise sessions.   Initial Home Exercises Provided 08/25/17      Nutrition:  Target Goals: Understanding of nutrition guidelines, daily intake of sodium 1500mg , cholesterol 200mg , calories 30% from fat and 7% or less from saturated fats, daily to have 5 or more servings of fruits and vegetables.  Biometrics:     Pre Biometrics - 08/10/17 1512      Pre Biometrics   Height 6' 0.45" (1.84 m)   Weight 191 lb 12.8 oz (87 kg)   Hip Circumference 39.75 inches   BMI (Calculated) 25.7   Triceps Skinfold 11 mm   % Body Fat 24.2 %   Grip Strength 53 kg   Flexibility 14 in   Single Leg Stand 4.92 seconds       Nutrition Therapy Plan and Nutrition Goals:     Nutrition Therapy & Goals - 08/13/17 1422      Nutrition Therapy    Diet Therapeutic Lifestyle Changes     Personal Nutrition Goals   Nutrition Goal Pt to identify and limit food sources of saturated fat, trans fat, and sodium     Intervention Plan   Intervention Prescribe, educate and counsel regarding individualized specific dietary modifications aiming towards targeted core components such as weight, hypertension, lipid management, diabetes, heart failure and other comorbidities.   Expected Outcomes Short Term Goal: Understand basic principles of dietary content, such as calories, fat, sodium, cholesterol and nutrients.;Long Term Goal: Adherence to prescribed nutrition plan.      Nutrition Discharge: Nutrition Scores:     Nutrition Assessments - 08/13/17 1424      MEDFICTS Scores   Pre Score 9      Nutrition Goals Re-Evaluation:   Nutrition Goals Re-Evaluation:   Nutrition Goals Discharge (Final Nutrition Goals Re-Evaluation):   Psychosocial: Target Goals: Acknowledge presence or absence of significant depression and/or stress, maximize coping skills, provide positive support system. Participant is able to verbalize types and ability to use techniques and skills needed for reducing stress and depression.  Initial Review & Psychosocial Screening:     Initial Psych Review & Screening - 08/10/17 1401      Initial Review   Current issues with None Identified     Family Dynamics   Good Support System? Yes   Comments brief assessment shows good support mechanisms. No further interventions needed at this time     Barriers   Psychosocial barriers to participate in program There are no identifiable barriers or psychosocial needs.     Screening Interventions   Interventions Encouraged to exercise      Quality of Life Scores:     Quality of Life - 08/20/17 1524      Quality of Life Scores  Health/Function Pre 26.5 %   Socioeconomic Pre 27.86 %   Psych/Spiritual Pre 29.29 %   Family Pre 30 %   GLOBAL Pre 27.87 %  QOL scores  reviewed with pt.  pt verbalizes no concerns. pt demonstrates positive outlook with good coping skills.      PHQ-9: Recent Review Flowsheet Data    Depression screen [facility] 2/9 08/16/2017 07/04/2012   Decreased Interest 0 0   Down, Depressed, Hopeless 0 0   PHQ - 2 Score 0 0     Interpretation of Total Score  Total Score Depression Severity:  1-4 = Minimal depression, 5-9 = Mild depression, 10-14 = Moderate depression, 15-19 = Moderately severe depression, 20-27 = Severe depression   Psychosocial Evaluation and Intervention:     Psychosocial Evaluation - 08/16/17 1654      Psychosocial Evaluation & Interventions   Interventions Encouraged to exercise with the program and follow exercise prescription   Comments no psychosocial needs identified, no interventions necessary    Expected Outcomes pt will exhibit positive outlook with good coping skills.    Continue Psychosocial Services  No Follow up required      Psychosocial Re-Evaluation:     Psychosocial Re-Evaluation    Row Name 08/20/17 [place] 09/14/17 0817 09/14/17 0818         Psychosocial Re-Evaluation   Current issues with None Identified None Identified None Identified     Comments no psychosocial needs identified, no interventions necessary  no psychosocial needs identified, no interventions necessary  no psychosocial needs identified, no interventions necessary      Expected Outcomes pt will exhibit positive outlook with good coping skills.  pt will exhibit positive outlook with good coping skills.  pt will exhibit positive outlook with good coping skills.      Interventions Encouraged to attend Cardiac Rehabilitation for the exercise Encouraged to attend Cardiac Rehabilitation for the exercise Encouraged to attend Cardiac Rehabilitation for the exercise     Continue Psychosocial Services  No Follow up required No Follow up required No Follow up required        Psychosocial Discharge (Final Psychosocial Re-Evaluation):      Psychosocial Re-Evaluation - 09/14/17 0818      Psychosocial Re-Evaluation   Current issues with None Identified   Comments no psychosocial needs identified, no interventions necessary    Expected Outcomes pt will exhibit positive outlook with good coping skills.    Interventions Encouraged to attend Cardiac Rehabilitation for the exercise   Continue Psychosocial Services  No Follow up required      Vocational Rehabilitation: Provide vocational rehab assistance to qualifying candidates.   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 Participant will state understanding/return demonstration of topics presented.  Learning Barriers/Preferences:     Learning Barriers/Preferences - 08/10/17 1357      Learning Barriers/Preferences   Learning Barriers Sight   Learning Preferences Audio;Group Instruction;Individual Instruction;Pictoral;Skilled Demonstration;Verbal Instruction;Video;Written Material      Education Topics: Count Your Pulse:  -Group instruction provided by verbal instruction, demonstration, patient participation and written materials to support subject.  Instructors address importance of being able to find your pulse and how to count your pulse when at  home without a heart monitor.  Patients get hands on experience counting their pulse with staff help and individually.   CARDIAC REHAB PHASE II EXERCISE from 09/17/2017 in [place]  Date  09/17/17  Instruction Review Code  2- meets goals/outcomes      Heart Attack, Angina, and Risk Factor Modification:  -Group instruction provided by verbal instruction, video, and written materials to  support subject.  Instructors address signs and symptoms of angina and heart attacks.    Also discuss risk factors for heart disease and how to make changes to improve heart health risk factors.   Functional Fitness:  -Group instruction provided by verbal instruction, demonstration, patient participation, and written materials to support subject.  Instructors address safety measures for doing things around the house.  Discuss how to get up and down off the floor, how to pick things up properly, how to safely get out of a chair without assistance, and balance training.   CARDIAC REHAB PHASE II EXERCISE from 09/17/2017 in [place]  Date  09/03/17  Instruction Review Code  2- meets goals/outcomes      Meditation and Mindfulness:  -Group instruction provided by verbal instruction, patient participation, and written materials to support subject.  Instructor addresses importance of mindfulness and meditation practice to help reduce stress and improve awareness.  Instructor also leads participants through a meditation exercise.    CARDIAC REHAB PHASE II EXERCISE from 09/17/2017 in [place]  Date  08/18/17  Instruction Review Code  2- meets goals/outcomes      Stretching for Flexibility and Mobility:  -Group instruction provided by verbal instruction, patient participation, and written materials to support subject.  Instructors lead participants through series of stretches that are designed to increase flexibility thus improving mobility.  These stretches are additional exercise for major muscle groups that are typically performed during regular warm up and cool down.   CARDIAC REHAB PHASE II EXERCISE from 09/17/2017 in [place]  Date  09/08/17  Instruction Review Code  2- meets goals/outcomes      Hands Only CPR:  -Group verbal, video, and participation provides a basic overview of AHA guidelines  for community CPR. Role-play of emergencies allow participants the opportunity to practice calling for help and chest compression technique with discussion of AED use.   Hypertension: -Group verbal and written instruction that provides a basic overview of hypertension including the most recent diagnostic guidelines, risk factor reduction with self-care instructions and medication management.    Nutrition I class: Heart Healthy Eating:  -Group instruction provided by PowerPoint slides, verbal discussion, and written materials to support subject matter. The instructor gives an explanation and review of the Therapeutic Lifestyle Changes diet recommendations, which includes a discussion on lipid goals, dietary fat, sodium, fiber, plant stanol/sterol esters, sugar, and the components of a well-balanced, healthy diet.   CARDIAC REHAB PHASE II EXERCISE from 09/17/2017 in [place]  Date  08/10/17  Educator  RD  Instruction Review Code  Not applicable      Nutrition II class: Lifestyle Skills:  -Group instruction provided by PowerPoint slides, verbal discussion, and written materials to support subject matter. The instructor gives an explanation and review of label reading, grocery shopping for heart health, heart healthy recipe modifications, and ways to make healthier choices when eating out.   CARDIAC REHAB PHASE II EXERCISE from 09/17/2017 in [facility]  CARDIAC REHAB  Date  08/10/17  Educator  RD  Instruction Review Code  Not applicable      Diabetes Question & Answer:  -Group instruction provided by PowerPoint slides, verbal discussion, and written materials to support subject matter. The instructor gives an explanation and review of diabetes co-morbidities, pre- and post-prandial blood glucose goals, pre-exercise blood glucose goals, signs, symptoms, and treatment of hypoglycemia and hyperglycemia, and foot care basics.   Diabetes Blitz:   -Group instruction provided by PowerPoint slides, verbal discussion, and written materials to support subject matter. The instructor gives an explanation and review of the physiology behind type 1 and type 2 diabetes, diabetes medications and rational behind using different medications, pre- and post-prandial blood glucose recommendations and Hemoglobin A1c goals, diabetes diet, and exercise including blood glucose guidelines for exercising safely.    Portion Distortion:  -Group instruction provided by PowerPoint slides, verbal discussion, written materials, and food models to support subject matter. The instructor gives an explanation of serving size versus portion size, changes in portions sizes over the last 20 years, and what consists of a serving from each food group.   CARDIAC REHAB PHASE II EXERCISE from 09/17/2017 in [place]  Date  09/01/17  Educator  RD  Instruction Review Code  2- meets goals/outcomes      Stress Management:  -Group instruction provided by verbal instruction, video, and written materials to support subject matter.  Instructors review role of stress in heart disease and how to cope with stress positively.     CARDIAC REHAB PHASE II EXERCISE from 09/17/2017 in [place]  Date  09/10/17  Instruction Review Code  2- meets goals/outcomes      Exercising on Your Own:  -Group instruction provided by verbal instruction, power point, and written materials to support subject.  Instructors discuss benefits of exercise, components of exercise, frequency and intensity of exercise, and end points for exercise.  Also discuss use of nitroglycerin and activating EMS.  Review options of places to exercise outside of rehab.  Review guidelines for sex with heart disease.   Cardiac Drugs I:  -Group instruction provided by verbal instruction and written materials to support subject.  Instructor reviews cardiac drug  classes: antiplatelets, anticoagulants, beta blockers, and statins.  Instructor discusses reasons, side effects, and lifestyle considerations for each drug class.   Cardiac Drugs II:  -Group instruction provided by verbal instruction and written materials to support subject.  Instructor reviews cardiac drug classes: angiotensin converting enzyme inhibitors (ACE-I), angiotensin II receptor blockers (ARBs), nitrates, and calcium channel blockers.  Instructor discusses reasons, side effects, and lifestyle considerations for each drug class.   CARDIAC REHAB PHASE II EXERCISE from 09/17/2017 in [facility]  Date  08/25/17  Instruction Review Code  2- meets goals/outcomes      Anatomy and Physiology of the Circulatory System:  Group verbal and written instruction and models provide basic cardiac anatomy and physiology, with the coronary electrical and arterial systems. Review of: AMI, Angina, Valve disease, Heart Failure, Peripheral Artery Disease, Cardiac Arrhythmia, Pacemakers, and the ICD.   Other Education:  -Group or individual verbal, written, or video instructions that support the educational goals of the cardiac rehab program.   Knowledge Questionnaire Score:     Knowledge Questionnaire Score - 08/10/17 1413      Knowledge Questionnaire Score   Pre Score 23/24      Core Components/Risk Factors/Patient Goals at Admission:  Personal Goals and Risk Factors at Admission - 08/10/17 1357      Core Components/Risk Factors/Patient Goals on Admission   Hypertension Yes   Intervention Provide education on lifestyle modifcations including regular physical activity/exercise, weight management, moderate sodium restriction and increased consumption of fresh fruit, vegetables, and low fat dairy, alcohol moderation, and smoking cessation.;Monitor prescription use compliance.   Lipids Yes   Intervention Provide education and support for participant on nutrition  & aerobic/resistive exercise along with prescribed medications to achieve LDL 70mg , HDL >40mg .   Expected Outcomes Short Term: Participant states understanding of desired cholesterol values and is compliant with medications prescribed. Participant is following exercise prescription and nutrition guidelines.;Long Term: Cholesterol controlled with medications as prescribed, with individualized exercise RX and with personalized nutrition plan. Value goals: LDL < 70mg , HDL > 40 mg.      Core Components/Risk Factors/Patient Goals Review:      Goals and Risk Factor Review    Row Name 08/18/17 1059 09/14/17 0817           Core Components/Risk Factors/Patient Goals Review   Personal Goals Review Hypertension;Lipids Hypertension;Lipids      Review pt with CAD RF demonstrates eagerness to participate in CR activities. pt demonstrates adequate BP managment at CR activities.  pt with CAD RF demonstrates eagerness to participate in CR activities. pt demonstrates adequate BP managment at CR activities.       Expected Outcomes pt will participate in  CR exercise, nutrition and lifestyle modification activities to decrease overall RF.  pt will participate in  CR exercise, nutrition and lifestyle modification activities to decrease overall RF.          Core Components/Risk Factors/Patient Goals at Discharge (Final Review):      Goals and Risk Factor Review - 09/14/17 0817      Core Components/Risk Factors/Patient Goals Review   Personal Goals Review Hypertension;Lipids   Review pt with CAD RF demonstrates eagerness to participate in CR activities. pt demonstrates adequate BP managment at CR activities.    Expected Outcomes pt will participate in  CR exercise, nutrition and lifestyle modification activities to decrease overall RF.       ITP Comments:     ITP Comments    Row Name 08/10/17 1337 08/24/17 1559 09/14/17 0817       ITP Comments Dr Fransico Him, Medical Director 30 day ITP review.   pt with good participation and attendance.  continue current regimen unless therwise directed by medical director.  30 day ITP review.  pt with good participation and attendance.  continue current regimen unless therwise directed by medical director.         Comments:

## 2017-09-20 ENCOUNTER — Encounter (HOSPITAL_COMMUNITY)
Admission: RE | Admit: 2017-09-20 | Discharge: 2017-09-20 | Disposition: A | Discharge status: Home / Self Care | Payer: Medicare Other | Source: Ambulatory Visit | Attending: Cardiovascular Disease | Admitting: Cardiovascular Disease

## 2017-09-20 DIAGNOSIS — Z955 Presence of coronary angioplasty implant and graft: Secondary | ICD-10-CM | POA: Diagnosis not present

## 2017-09-22 ENCOUNTER — Encounter (HOSPITAL_COMMUNITY)
Admission: RE | Admit: 2017-09-22 | Discharge: 2017-09-22 | Disposition: A | Discharge status: Home / Self Care | Payer: Medicare Other | Source: Ambulatory Visit | Attending: Cardiovascular Disease | Admitting: Cardiovascular Disease

## 2017-09-22 DIAGNOSIS — Z955 Presence of coronary angioplasty implant and graft: Secondary | ICD-10-CM | POA: Diagnosis not present

## 2017-09-24 ENCOUNTER — Encounter (HOSPITAL_COMMUNITY)
Admission: RE | Admit: 2017-09-24 | Discharge: 2017-09-24 | Disposition: A | Discharge status: Home / Self Care | Payer: Medicare Other | Source: Ambulatory Visit | Attending: Cardiovascular Disease | Admitting: Cardiovascular Disease

## 2017-09-24 DIAGNOSIS — Z955 Presence of coronary angioplasty implant and graft: Secondary | ICD-10-CM | POA: Diagnosis not present

## 2017-09-27 ENCOUNTER — Encounter (HOSPITAL_COMMUNITY)
Admission: RE | Admit: 2017-09-27 | Discharge: 2017-09-27 | Disposition: A | Discharge status: Home / Self Care | Payer: Medicare Other | Source: Ambulatory Visit | Attending: Cardiovascular Disease | Admitting: Cardiovascular Disease

## 2017-09-27 DIAGNOSIS — Z955 Presence of coronary angioplasty implant and graft: Secondary | ICD-10-CM | POA: Diagnosis not present

## 2017-09-29 ENCOUNTER — Encounter (HOSPITAL_COMMUNITY)
Admission: RE | Admit: 2017-09-29 | Discharge: 2017-09-29 | Disposition: A | Discharge status: Home / Self Care | Payer: Medicare Other | Source: Ambulatory Visit | Attending: Cardiovascular Disease | Admitting: Cardiovascular Disease

## 2017-09-29 DIAGNOSIS — Z955 Presence of coronary angioplasty implant and graft: Secondary | ICD-10-CM

## 2017-09-30 ENCOUNTER — Ambulatory Visit (INDEPENDENT_AMBULATORY_CARE_PROVIDER_SITE_OTHER): Payer: Medicare Other | Admitting: *Deleted

## 2017-09-30 ENCOUNTER — Ambulatory Visit (INDEPENDENT_AMBULATORY_CARE_PROVIDER_SITE_OTHER): Payer: Medicare Other | Admitting: Cardiovascular Disease

## 2017-09-30 ENCOUNTER — Encounter: Payer: Self-pay | Admitting: Cardiovascular Disease

## 2017-09-30 VITALS — BP 116/80 | HR 90 | Ht 73.0 in | Wt 191.8 lb

## 2017-09-30 DIAGNOSIS — E785 Hyperlipidemia, unspecified: Secondary | ICD-10-CM | POA: Diagnosis not present

## 2017-09-30 DIAGNOSIS — I2510 Atherosclerotic heart disease of native coronary artery without angina pectoris: Secondary | ICD-10-CM

## 2017-09-30 DIAGNOSIS — I428 Other cardiomyopathies: Secondary | ICD-10-CM | POA: Diagnosis not present

## 2017-09-30 DIAGNOSIS — I442 Atrioventricular block, complete: Secondary | ICD-10-CM

## 2017-09-30 NOTE — Patient Instructions (Signed)

## 2017-09-30 NOTE — Progress Notes (Signed)
Remote pacemaker transmission.   

## 2017-09-30 NOTE — Progress Notes (Signed)
Cardiology Office Note Date:  10/01/2017   ID:  Walter Mt, [name], DOB 1946/04/14, MRN [id number]  PCP:  [name], [name]  Cardiologist:  Janne Lab, [name]    Chief Complaint  Patient presents with  . Coronary Artery Disease     History of Present Illness: Walter Mt, [name] is a 71 y.o. male who presents for follow-up of coronary artery disease.  The patient underwent PCI of the left circumflex in 2013.  He has had problems with dilated cardiomyopathy and atrial fibrillation.  He has undergone treatment with CRT-P and has undergone multiple ablation procedures and cardioversions.  He presented in September 2018 with new onset angina and was found to have 95% proximal LAD stenosis with mild to moderate diffuse disease elsewhere.  His left circumflex stent was patent with chronic occlusion of the distal AV circumflex.  The patient was treated with PCI of the LAD using a drug-eluting stent platform.  He continue triple therapy with aspirin, clopidogrel, and rivaroxaban for 1 month, then he stopped aspirin and has remained on a combination of clopidogrel and rivaroxaban.  He is doing well with no anginal symptoms.  He is participating in cardiac rehab and specifically denies chest pain or shortness of breath.  He is undergoing evaluation to obtain a PC SK 9 inhibitor through the [place] system.   Past Medical History:  Diagnosis Date  . Atrial flutter -atypical 2007   ABLATION x2 [facility]  . Atrial tachycardia ([place]) 2007   ABLATION [facility]  . Benign paroxysmal vertigo   . CAD (coronary artery disease)    a. abnl mv 01/2012;  b. cath 05/23/2012 LCX 80@ bifurcation w/ OM, 95% @ AV groove, otw nonobs dzs, EF 55-65%;  . Carcinoma ([place])    skin cancer "off my chest"  . Familial tremor   . First degree AV block    had 4 ablasions that left py with heart block  . History of blood transfusion 2010   "2nd day after prostatectomy"  . Hyperlipidemia   . Hypertension   . Incontinence    DECREASING  INCONTINENCE RELATED TO PROSTATECTOMY.  . Migraines   . Presence of permanent cardiac pacemaker 2013 ?  Marland Kitchen Prostate cancer ([place])   . Pulsatile tinnitus   . Tachy-brady syndrome ([place])    a. developed after RFCA @ [facility] 2013-> MDT PPM;  b. s/p RA lead revision 05/2012    Past Surgical History:  Procedure Laterality Date  . ATRIAL ABLATION SURGERY  04/01/12   "4th time I had one"  . BiV Pacemaker upgrade [name] 06/17/2016   Performed by [name], [name] at [place] [facility]  . CARDIAC CATHETERIZATION    . CARDIOVERSION [name] 04/27/2012   Performed by Darlin Coco, [name] at [facility] OR  . CARDIOVERSION [name] 01/13/2012   Performed by Lelon Perla, [name] at [place]  . CARDIOVERSION [name] 12/03/2011   Performed by [name], [name] at [place]    . COLONOSCOPY WITH PROPOFOL [name] 10/29/2016   Performed by Doran Stabler, [name] at [place]  . CORONARY ANGIOPLASTY WITH STENT PLACEMENT  06/02/12   "1; first one ever"  . CORONARY STENT INTERVENTION [name] 07/21/2017   Performed by [name], [name] at [place] [facility]  . HERNIA REPAIR  1980's  . INGUINAL HERNIA REPAIR  1945   left side  . INSERT / REPLACE / REMOVE PACEMAKER  03/2012   initial placement  .  INSERT / REPLACE / REMOVE PACEMAKER  05/25/12   "atrial lead change"  . LEAD REVISION [name] 05/25/2012   Performed by Evans Lance, [name] at [facility]  . LEFT HEART CATH AND CORONARY ANGIOGRAPHY [name] 07/21/2017   Performed by [name], [name] at [place] [facility]  . PERCUTANEOUS CORONARY STENT INTERVENTION (PCI-S) [name] 06/02/2012   Performed by Hillary Bow, [name] at [facility]  . PROSTATECTOMY  12/25/9369   COMPLICATED  . SHOULDER SURGERY     Bilateral; "boney spurs removed"  . SKIN CANCER EXCISION     "in situ; right chest"  . TONSILLECTOMY AND ADENOIDECTOMY     "as a child"    Current Outpatient Medications  Medication Sig Dispense Refill  . acyclovir (ZOVIRAX) 400 MG tablet Take 400 mg as directed by mouth. Herpes flare on  legs    . cholecalciferol (VITAMIN D) 1000 UNITS tablet Take 1,000 Units by mouth daily.    . clopidogrel (PLAVIX) 75 MG tablet Take 1 tablet (75 mg total) by mouth daily with breakfast. 90 tablet 2  . nitroGLYCERIN (NITROSTAT) 0.4 MG SL tablet Place 0.4 mg under the tongue every 5 (five) minutes as needed for chest pain (x 3 tabs).    . propranolol (INDERAL) 10 MG tablet Take 10 mg by mouth 3 (three) times daily as needed. Palpitations or benign central tremor     . vitamin B-12 (CYANOCOBALAMIN) 1000 MCG tablet Take 1,000 mcg by mouth daily.    Alveda Reasons 20 MG TABS tablet TAKE 1 TABLET(20 MG) BY MOUTH DAILY WITH DINNER 90 tablet 1  . losartan (COZAAR) 25 MG tablet Take 1 tablet (25 mg total) by mouth 2 (two) times daily. 60 tablet 6   No current facility-administered medications for this visit.     Allergies:   Ceclor [cefaclor]; Chlorhexidine; Statins; and Ace inhibitors   Social History:  The patient  reports that  has never smoked. he has never used smokeless tobacco. He reports that he drinks about 1.5 oz of alcohol per week. He reports that he does not use drugs.   Family History:  The patient's  family history includes Colon cancer in his sister; Diabetes in his brother; Heart disease in his brother and father.    ROS:  Please see the history of present illness.  Otherwise, review of systems is positive for hearing loss, palpitations, headaches, epistaxis.  All other systems are reviewed and negative.    PHYSICAL EXAM: VS:  BP 116/80   Pulse 90   Ht 6\' 1"  (1.854 m)   Wt 191 lb 12.8 oz (87 kg)   BMI 25.30 kg/m  , BMI Body mass index is 25.3 kg/m. GEN: Well nourished, well developed, in no acute distress  HEENT: normal  Neck: no JVD, no masses. No carotid bruits Cardiac: RRR without murmur or gallop                Respiratory:  clear to auscultation bilaterally, normal work of breathing GI: soft, nontender, nondistended, + BS MS: no deformity or atrophy  Ext: no pretibial  edema, pedal pulses 2+= bilaterally Skin: warm and dry, no rash Neuro:  Strength and sensation are intact Psych: euthymic mood, full affect  EKG:  EKG is not ordered today.  Recent Labs: 08/13/2017: Hemoglobin 14.2; Platelets 204 08/23/2017: BUN 14; Creatinine, Ser 1.10; Potassium 4.8; Sodium 134   Lipid Panel     Component Value Date/Time   CHOL 191 09/13/2017 1358  TRIG 55 09/13/2017 1358   HDL 65 09/13/2017 1358   CHOLHDL 2.9 09/13/2017 1358   LDLCALC 115 (H) 09/13/2017 1358   LDLDIRECT 130 (H) 09/13/2017 1358      Wt Readings from Last 3 Encounters:  09/30/17 191 lb 12.8 oz (87 kg)  08/13/17 192 lb (87.1 kg)  08/10/17 191 lb 12.8 oz (87 kg)     Cardiac Studies Reviewed: Echo 02-23-2017: Study Conclusions  - Left ventricle: The cavity size was normal. Wall thickness was   increased in a pattern of mild LVH. Systolic function was mildly   to moderately reduced. The estimated ejection fraction was in the   range of 40% to 45%. Wall motion was normal; there were no   regional wall motion abnormalities. The study is not technically   sufficient to allow evaluation of LV diastolic function. - Aortic valve: There was trivial regurgitation. - Aorta: Aortic root dimension: 39 mm (ED). - Aortic root: The aortic root is mildly dilated at the sinotubular   junction. - Mitral valve: Mildly thickened leaflets . There was mild   regurgitation. - Left atrium: The atrium was moderately dilated. - Right ventricle: The cavity size was mildly dilated. Pacer wire   or catheter noted in right ventricle. Systolic function was   normal. - Right atrium: Severely dilated. Pacer wire or catheter noted in   right atrium. - Tricuspid valve: There was mild regurgitation. - Pulmonic valve: The valve appears to be grossly normal. There was   mild regurgitation. - Pulmonary arteries: PA peak pressure: 35 mm Hg (S). - Systemic veins: The IVC measures <2.1 cm, but does not collapse   >50%,  suggesting an elevated RA pressure of 8 mmHg.  Impressions:  - Compared to a prior study in 2017, the LVEF has improved from   35-40% up to 40-45%.  Cardiac Catheterization 07-21-2017: Conclusion   1. Severe single-vessel coronary artery disease with critical stenosis of the proximal LAD, treated successfully with PCI using a 3.5 x 15 mm Xience Sierra drug-eluting stent 2. Moderate residual coronary artery disease involving the mid right coronary artery and mid LAD 3. 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 Normal LV systolic function (improved from previous)  Recommendations: The patient is chronically anticoagulated with Xarelto for atrial fibrillation. This can be restarted tomorrow. Recommend triple therapy with aspirin 81 mg, Plavix 75 mg, and Xarelto 20 mg for a period of one month. After that time I would recommend discontinuing aspirin and remaining on a combination of Plavix and Xarelto for total of 6 months from the time of this PCI procedure.   ASSESSMENT AND PLAN: 1.  Coronary artery disease, native vessel, without angina: The patient is tolerating his current medical therapy.  He will continue on a combination of clopidogrel and rivaroxaban.  I would like to see him back in about 6 months and we can make a decision on how to continue best with antiplatelet therapy in the setting of chronic oral anticoagulation.  He was encouraged to continue with cardiac rehab.  2.  Hyperlipidemia: Statin intolerant.  Exploring options for a PC SK 9 inhibitor.  Current medicines are reviewed with the patient today.  The patient does not have concerns regarding medicines.  Labs/ tests ordered today include:  No orders of the defined types were placed in this encounter.   Disposition:   FU 6 months  Signed,  Janne Lab, [name]  10/01/2017 6:04 PM    [address], [place], [place]  [address] Phone: [phone]; Fax: [phone]

## 2017-10-01 ENCOUNTER — Encounter (HOSPITAL_COMMUNITY)
Admission: RE | Admit: 2017-10-01 | Discharge: 2017-10-01 | Disposition: A | Discharge status: Home / Self Care | Payer: Medicare Other | Source: Ambulatory Visit | Attending: Cardiovascular Disease | Admitting: Cardiovascular Disease

## 2017-10-01 ENCOUNTER — Telehealth (HOSPITAL_COMMUNITY): Payer: Self-pay

## 2017-10-01 ENCOUNTER — Encounter: Payer: Self-pay | Admitting: Cardiovascular Disease

## 2017-10-01 DIAGNOSIS — Z955 Presence of coronary angioplasty implant and graft: Secondary | ICD-10-CM | POA: Diagnosis not present

## 2017-10-01 NOTE — Telephone Encounter (Signed)
-----   Message from [name], MD sent at 08/25/2017 11:10 PM EDT ----- Regarding: RE: Strength training request Would be fine for him to begin to do strength training. thanks ----- Message ----- From: Dorna Bloom D Sent: 08/25/2017  10:35 AM To: [name], MD Subject: Strength training request                      Patient has been in cardiac rehab approximately 2 weeks and is doing  well. Pt is s/p stent placement on 07/24/17. Pt has been averaging 3-4 METS and BP/HR has been in stabled with exercise. Pt is interested in doing strength training. If you feel this patient is appropriate to begin strength training please f/u with cardiac rehab staff, and I will be glad to introduce/orient patient to weights once pt is in the program at 4-5 weeks (October 22nd).      Thank you  Theodis Shove, ACSM RCEP

## 2017-10-04 ENCOUNTER — Encounter (HOSPITAL_COMMUNITY)
Admission: RE | Admit: 2017-10-04 | Discharge: 2017-10-04 | Disposition: A | Discharge status: Home / Self Care | Payer: Medicare Other | Source: Ambulatory Visit | Attending: Cardiovascular Disease | Admitting: Cardiovascular Disease

## 2017-10-04 DIAGNOSIS — Z955 Presence of coronary angioplasty implant and graft: Secondary | ICD-10-CM

## 2017-10-06 ENCOUNTER — Encounter: Payer: Self-pay | Admitting: Cardiology

## 2017-10-06 ENCOUNTER — Encounter (HOSPITAL_COMMUNITY)
Admission: RE | Admit: 2017-10-06 | Discharge: 2017-10-06 | Disposition: A | Discharge status: Home / Self Care | Payer: Medicare Other | Source: Ambulatory Visit | Attending: Cardiovascular Disease | Admitting: Cardiovascular Disease

## 2017-10-06 DIAGNOSIS — Z955 Presence of coronary angioplasty implant and graft: Secondary | ICD-10-CM | POA: Diagnosis not present

## 2017-10-11 ENCOUNTER — Encounter (HOSPITAL_COMMUNITY)
Admission: RE | Admit: 2017-10-11 | Discharge: 2017-10-11 | Disposition: A | Discharge status: Home / Self Care | Payer: Medicare Other | Source: Ambulatory Visit | Attending: Cardiovascular Disease | Admitting: Cardiovascular Disease

## 2017-10-11 DIAGNOSIS — Z955 Presence of coronary angioplasty implant and graft: Secondary | ICD-10-CM

## 2017-10-13 ENCOUNTER — Encounter (HOSPITAL_COMMUNITY)
Admission: RE | Admit: 2017-10-13 | Discharge: 2017-10-13 | Disposition: A | Discharge status: Home / Self Care | Payer: Medicare Other | Source: Ambulatory Visit | Attending: Cardiovascular Disease | Admitting: Cardiovascular Disease

## 2017-10-13 DIAGNOSIS — Z955 Presence of coronary angioplasty implant and graft: Secondary | ICD-10-CM | POA: Diagnosis not present

## 2017-10-13 LAB — CUP PACEART REMOTE DEVICE CHECK
Battery Remaining Longevity: 106 mo
Brady Statistic AP VP Percent: 77.37 %
Brady Statistic AP VS Percent: 0.1 %
Brady Statistic AS VP Percent: 15.58 %
Brady Statistic AS VS Percent: 6.96 %
Brady Statistic RV Percent Paced: 99.04 %
Implantable Lead Implant Date: 20130517
Implantable Lead Implant Date: 20130710
Implantable Lead Location: 753858
Implantable Lead Location: 753860
Implantable Pulse Generator Implant Date: 20170802
Lead Channel Impedance Value: 361 Ohm
Lead Channel Impedance Value: 456 Ohm
Lead Channel Impedance Value: 627 Ohm
Lead Channel Impedance Value: 874 Ohm
Lead Channel Impedance Value: 931 Ohm
Lead Channel Impedance Value: 969 Ohm
Lead Channel Impedance Value: 988 Ohm
Lead Channel Pacing Threshold Amplitude: 0.75 V
Lead Channel Pacing Threshold Amplitude: 1 V
Lead Channel Pacing Threshold Pulse Width: 0.4 ms
Lead Channel Pacing Threshold Pulse Width: 0.4 ms
Lead Channel Sensing Intrinsic Amplitude: 1.625 mV
Lead Channel Sensing Intrinsic Amplitude: 15.625 mV
Lead Channel Setting Pacing Amplitude: 1.5 V
Lead Channel Setting Pacing Amplitude: 2 V
Lead Channel Setting Pacing Pulse Width: 0.4 ms
MDC IDC LEAD IMPLANT DT: 20170802
MDC IDC LEAD LOCATION: 753859
MDC IDC MSMT BATTERY VOLTAGE: 3 V
MDC IDC MSMT LEADCHNL LV IMPEDANCE VALUE: 1064 Ohm
MDC IDC MSMT LEADCHNL LV IMPEDANCE VALUE: 1121 Ohm
MDC IDC MSMT LEADCHNL LV IMPEDANCE VALUE: 532 Ohm
MDC IDC MSMT LEADCHNL LV IMPEDANCE VALUE: 589 Ohm
MDC IDC MSMT LEADCHNL LV PACING THRESHOLD PULSEWIDTH: 0.4 ms
MDC IDC MSMT LEADCHNL RA IMPEDANCE VALUE: 513 Ohm
MDC IDC MSMT LEADCHNL RA PACING THRESHOLD AMPLITUDE: 1 V
MDC IDC MSMT LEADCHNL RA SENSING INTR AMPL: 1.625 mV
MDC IDC MSMT LEADCHNL RV IMPEDANCE VALUE: 361 Ohm
MDC IDC MSMT LEADCHNL RV IMPEDANCE VALUE: 456 Ohm
MDC IDC MSMT LEADCHNL RV SENSING INTR AMPL: 15.625 mV
MDC IDC SESS DTM: 20181115105312
MDC IDC SET LEADCHNL LV PACING PULSEWIDTH: 0.8 ms
MDC IDC SET LEADCHNL RV PACING AMPLITUDE: 2.5 V
MDC IDC SET LEADCHNL RV SENSING SENSITIVITY: 4 mV
MDC IDC STAT BRADY RA PERCENT PACED: 6.38 %

## 2017-10-15 ENCOUNTER — Encounter (HOSPITAL_COMMUNITY)
Admission: RE | Admit: 2017-10-15 | Discharge: 2017-10-15 | Disposition: A | Discharge status: Home / Self Care | Payer: Medicare Other | Source: Ambulatory Visit | Attending: Cardiovascular Disease | Admitting: Cardiovascular Disease

## 2017-10-15 DIAGNOSIS — Z955 Presence of coronary angioplasty implant and graft: Secondary | ICD-10-CM

## 2017-10-15 NOTE — Progress Notes (Signed)
Cardiac Individual Treatment Plan  Patient Details  Name: Walter CARREKER, MD MRN: [id number] Date of Birth: 10/02/1946 Referring Provider:     CARDIAC REHAB PHASE II ORIENTATION from 08/10/2017 in [place]  Referring Provider  [name], MD      Initial Encounter Date:    CARDIAC REHAB PHASE II ORIENTATION from 08/10/2017 in [place]  Date  08/10/17  Referring Provider  [name], MD      Visit Diagnosis: S/P coronary artery stent placement 07/22/2017 [facility] LAD  Patient's Home Medications on Admission:  Current Outpatient Medications:  .  acyclovir (ZOVIRAX) 400 MG tablet, Take 400 mg as directed by mouth. Herpes flare on legs, Disp: , Rfl:  .  cholecalciferol (VITAMIN D) 1000 UNITS tablet, Take 1,000 Units by mouth daily., Disp: , Rfl:  .  clopidogrel (PLAVIX) 75 MG tablet, Take 1 tablet (75 mg total) by mouth daily with breakfast., Disp: 90 tablet, Rfl: 2 .  losartan (COZAAR) 25 MG tablet, Take 1 tablet (25 mg total) by mouth 2 (two) times daily., Disp: 60 tablet, Rfl: 6 .  nitroGLYCERIN (NITROSTAT) 0.4 MG SL tablet, Place 0.4 mg under the tongue every 5 (five) minutes as needed for chest pain (x 3 tabs)., Disp: , Rfl:  .  propranolol (INDERAL) 10 MG tablet, Take 10 mg by mouth 3 (three) times daily as needed. Palpitations or benign central tremor , Disp: , Rfl:  .  vitamin B-12 (CYANOCOBALAMIN) 1000 MCG tablet, Take 1,000 mcg by mouth daily., Disp: , Rfl:  .  XARELTO 20 MG TABS tablet, TAKE 1 TABLET(20 MG) BY MOUTH DAILY WITH DINNER, Disp: 90 tablet, Rfl: 1  Past Medical History: Past Medical History:  Diagnosis Date  . Atrial flutter -atypical 2007   ABLATION x2 [facility]  . Atrial tachycardia ([place]) 2007   ABLATION [facility]  . Benign paroxysmal vertigo   . CAD (coronary artery disease)    a. abnl mv 01/2012;  b. cath 05/23/2012 LCX 80@ bifurcation w/ OM, 95% @ AV groove, otw nonobs dzs, EF 55-65%;  . Carcinoma  ([place])    skin cancer "off my chest"  . Familial tremor   . First degree AV block    had 4 ablasions that left py with heart block  . History of blood transfusion 2010   "2nd day after prostatectomy"  . Hyperlipidemia   . Hypertension   . Incontinence    DECREASING INCONTINENCE RELATED TO PROSTATECTOMY.  . Migraines   . Presence of permanent cardiac pacemaker 2013 ?  Marland Kitchen Prostate cancer ([place])   . Pulsatile tinnitus   . Tachy-brady syndrome ([place])    a. developed after RFCA @ [facility] 2013-> MDT PPM;  b. s/p RA lead revision 05/2012    Tobacco Use: Social History   Tobacco Use  Smoking Status Never Smoker  Smokeless Tobacco Never Used    Labs: Recent Review Scientist, physiological    Labs for ITP Cardiac and Pulmonary Rehab Latest Ref Rng & Units 09/13/2017   Cholestrol 100 - 199 mg/dL 191   LDLCALC 0 - 99 mg/dL 115(H)   LDLDIRECT 0 - 99 mg/dL 130(H)   HDL >39 mg/dL 65   Trlycerides 0 - 149 mg/dL 55      Capillary Blood Glucose: No results found for: GLUCAP   Exercise Target Goals:    Exercise Program Goal: Individual exercise prescription set with THRR, safety & activity barriers. Participant demonstrates ability to understand and report  RPE using BORG scale, to self-measure pulse accurately, and to acknowledge the importance of the exercise prescription.  Exercise Prescription Goal: Starting with aerobic activity 30 plus minutes a day, 3 days per week for initial exercise prescription. Provide home exercise prescription and guidelines that participant acknowledges understanding prior to discharge.  Activity Barriers & Risk Stratification: Activity Barriers & Cardiac Risk Stratification - 08/10/17 1408      Activity Barriers & Cardiac Risk Stratification   Activity Barriers  None    Cardiac Risk Stratification  Moderate       6 Minute Walk: 6 Minute Walk    Row Name 08/10/17 1417         6 Minute Walk   Phase  Initial     Distance  1434 feet     Walk Time  6 minutes      # of Rest Breaks  0     MPH  2.72     METS  3.11     RPE  11     VO2 Peak  10.9     Symptoms  No     Resting HR  69 bpm     Resting BP  128/84     Resting Oxygen Saturation   97 %     Max Ex. HR  98 bpm     Max Ex. BP  122/82     2 Minute Post BP  124/82        Oxygen Initial Assessment:   Oxygen Re-Evaluation:   Oxygen Discharge (Final Oxygen Re-Evaluation):   Initial Exercise Prescription: Initial Exercise Prescription - 08/10/17 1400      Date of Initial Exercise RX and Referring Provider   Date  08/10/17    Referring Provider  [name], MD      Treadmill   MPH  2.8    Grade  0    Minutes  10    METs  3.14      Bike   Level  1    Minutes  10    METs  3.16      NuStep   Level  4    SPM  90    Minutes  10    METs  3.1      Prescription Details   Frequency (times per week)  3    Duration  Progress to 30 minutes of continuous aerobic without signs/symptoms of physical distress      Intensity   THRR 40-80% of Max Heartrate  59-118    Ratings of Perceived Exertion  11-13    Perceived Dyspnea  0-4      Progression   Progression  Continue to progress workloads to maintain intensity without signs/symptoms of physical distress.      Resistance Training   Training Prescription  Yes    Weight  4lbs    Reps  10-15       Perform Capillary Blood Glucose checks as needed.  Exercise Prescription Changes: Exercise Prescription Changes    Row Name 08/16/17 1600 09/06/17 1500 09/24/17 1424 10/11/17 1700       Response to Exercise   Blood Pressure (Admit)  118/84  120/80  118/82  108/70    Blood Pressure (Exercise)  146/84  132/80  130/70  124/70    Blood Pressure (Exit)  102/70  112/70  102/70  110/70    Heart Rate (Admit)  69 bpm  76 bpm  80 bpm  91 bpm  Heart Rate (Exercise)  97 bpm  108 bpm  106 bpm  105 bpm    Heart Rate (Exit)  68 bpm  78 bpm  71 bpm  71 bpm    Rating of Perceived Exertion (Exercise)  12  12  11  13     Symptoms  none   none  none  none    Comments  -  -  -  Pt does weight station for 10 minutes.    Duration  Continue with 30 min of aerobic exercise without signs/symptoms of physical distress.  Continue with 30 min of aerobic exercise without signs/symptoms of physical distress.  Continue with 30 min of aerobic exercise without signs/symptoms of physical distress.  Continue with 30 min of aerobic exercise without signs/symptoms of physical distress.    Intensity  THRR unchanged  THRR unchanged  THRR unchanged  THRR unchanged      Progression   Progression  Continue to progress workloads to maintain intensity without signs/symptoms of physical distress.  Continue to progress workloads to maintain intensity without signs/symptoms of physical distress.  Continue to progress workloads to maintain intensity without signs/symptoms of physical distress.  Continue to progress workloads to maintain intensity without signs/symptoms of physical distress.    Average METs  2.9  3.4  3.9  3.8      Resistance Training   Training Prescription  Yes  Yes  Yes  Yes    Weight  6lbs  6lbs  8lbs  5lbs    Reps  10-15  10-15  10-15  10-15    Time  10 Minutes  10 Minutes  10 Minutes  10 Minutes      Treadmill   MPH  2.8  3  3.2  3.2    Grade  0  1  3  3     Minutes  10  10  15  10     METs  3.14  3.71  4.77  4.77      Bike   Level  1  1  1   -    Minutes  10  10  15   -    METs  3.16  3.2  3.2  -      NuStep   Level  4  4  -  4    SPM  90  90  -  90    Minutes  10  10  -  10    METs  2.4  3.4  -  2.9      Home Exercise Plan   Plans to continue exercise at  -  [employer] (comment) [place] or outdoor walking  [employer] (comment) [place] or outdoor walking  [employer] (comment)    Frequency  -  Add 2 additional days to program exercise sessions.  Add 2 additional days to program exercise sessions.  Add 2 additional days to program exercise sessions.    Initial Home  Exercises Provided  -  08/25/17  08/25/17  08/25/17       Exercise Comments: Exercise Comments    Row Name 08/16/17 1253 09/14/17 1411 10/12/17 1401       Exercise Comments  pt was oriented to exercise equipment on 08/16/17. Pt responded well to exercise session; will continue to monitor exercise progression and activity levels.   Reviewed METs and goals. Pt is tolerating exercise well; will continue to monitor pt's progress and activity levels.   Reviewed METs and  goals. Pt is tolerating exercise well; will continue to monitor pt's progress and activity levels.         Exercise Goals and Review: Exercise Goals    Row Name 08/10/17 1409             Exercise Goals   Increase Physical Activity  Yes       Intervention  Provide advice, education, support and counseling about physical activity/exercise needs.;Develop an individualized exercise prescription for aerobic and resistive training based on initial evaluation findings, risk stratification, comorbidities and participant's personal goals.       Expected Outcomes  Achievement of increased cardiorespiratory fitness and enhanced flexibility, muscular endurance and strength shown through measurements of functional capacity and personal statement of participant.       Increase Strength and Stamina  Yes       Intervention  Provide advice, education, support and counseling about physical activity/exercise needs.;Develop an individualized exercise prescription for aerobic and resistive training based on initial evaluation findings, risk stratification, comorbidities and participant's personal goals.       Expected Outcomes  Achievement of increased cardiorespiratory fitness and enhanced flexibility, muscular endurance and strength shown through measurements of functional capacity and personal statement of participant.       Able to understand and use rate of perceived exertion (RPE) scale  Yes       Intervention  Provide education and  explanation on how to use RPE scale       Expected Outcomes  Long Term:  Able to use RPE to guide intensity level when exercising independently;Short Term: Able to use RPE daily in rehab to express subjective intensity level       Knowledge and understanding of Target Heart Rate Range (THRR)  Yes       Intervention  Provide education and explanation of THRR including how the numbers were predicted and where they are located for reference       Expected Outcomes  Short Term: Able to state/look up THRR;Short Term: Able to use daily as guideline for intensity in rehab;Long Term: Able to use THRR to govern intensity when exercising independently       Able to check pulse independently  Yes       Intervention  Provide education and demonstration on how to check pulse in carotid and radial arteries.;Review the importance of being able to check your own pulse for safety during independent exercise       Expected Outcomes  Short Term: Able to explain why pulse checking is important during independent exercise;Long Term: Able to check pulse independently and accurately       Understanding of Exercise Prescription  Yes       Intervention  Provide education, explanation, and written materials on patient's individual exercise prescription       Expected Outcomes  Short Term: Able to explain program exercise prescription;Long Term: Able to explain home exercise prescription to exercise independently          Exercise Goals Re-Evaluation : Exercise Goals Re-Evaluation    Row Name 08/24/17 1429 08/25/17 1034 09/14/17 1411 10/12/17 1359 10/15/17 1625     Exercise Goal Re-Evaluation   Exercise Goals Review  Increase Physical Activity;Understanding of Exercise Prescription;Knowledge and understanding of Target Heart Rate Range (THRR);Able to understand and use rate of perceived exertion (RPE) scale;Increase Strength and Stamina  Increase Physical Activity;Able to understand and use rate of perceived exertion (RPE)  scale;Knowledge and understanding of Target Heart Rate Range (THRR);Understanding of  Exercise Prescription;Increase Strength and Stamina;Able to check pulse independently  Increase Physical Activity;Able to understand and use rate of perceived exertion (RPE) scale;Knowledge and understanding of Target Heart Rate Range (THRR);Understanding of Exercise Prescription;Increase Strength and Stamina;Able to check pulse independently  -  Increase Physical Activity;Able to understand and use rate of perceived exertion (RPE) scale;Knowledge and understanding of Target Heart Rate Range (THRR);Understanding of Exercise Prescription;Increase Strength and Stamina;Able to check pulse independently   Comments  Pt demonstrates use of RPE scale effectively and is making great progess in activity and exercise in cardiac rehab.  Reviewed home exercise with pt today.  Pt plans to walk or go to [employer] center for exercise.  Reviewed THR, pulse, RPE, sign and symptoms, NTG use, and when to call 911 or MD.  Also discussed weather considerations and indoor options.  Pt voiced understanding.  pt is getting back on track with HEP at the gym. Discussed importance of 150 minutes a week of cardiovascular fitness to improve heart health. Pt voiced understanding.  Pt stated being able to rake leaves with no difficulty. Pt is also walking for HEP and living a more active lifestyle. Pt was introduced to weights on resistance column in which pt is strengthening upper body and core to correct postural alignment.   Pt stated being able to rake leaves with no difficulty. Pt is also walking for HEP and living a more active lifestyle. Pt was introduced to weights on resistance column in which pt is strengthening upper body and core to correct postural alignment.    Expected Outcomes  Pt will continue to exercise in cardiac rehab safely.  Pt will be compliant with HEP and improve on cardiorespiratory fitness.  Pt will be compliant  with HEP and improve on cardiorespiratory fitness.  Pt will be compliant with HEP and improve on cardiorespiratory fitness.  Pt will be compliant with HEP and improve on cardiorespiratory fitness.       Discharge Exercise Prescription (Final Exercise Prescription Changes): Exercise Prescription Changes - 10/11/17 1700      Response to Exercise   Blood Pressure (Admit)  108/70    Blood Pressure (Exercise)  124/70    Blood Pressure (Exit)  110/70    Heart Rate (Admit)  91 bpm    Heart Rate (Exercise)  105 bpm    Heart Rate (Exit)  71 bpm    Rating of Perceived Exertion (Exercise)  13    Symptoms  none    Comments  Pt does weight station for 10 minutes.    Duration  Continue with 30 min of aerobic exercise without signs/symptoms of physical distress.    Intensity  THRR unchanged      Progression   Progression  Continue to progress workloads to maintain intensity without signs/symptoms of physical distress.    Average METs  3.8      Resistance Training   Training Prescription  Yes    Weight  5lbs    Reps  10-15    Time  10 Minutes      Treadmill   MPH  3.2    Grade  3    Minutes  10    METs  4.77      NuStep   Level  4    SPM  90    Minutes  10    METs  2.9      Home Exercise Plan   Plans to continue exercise at  [facility] (comment)  Frequency  Add 2 additional days to program exercise sessions.    Initial Home Exercises Provided  08/25/17       Nutrition:  Target Goals: Understanding of nutrition guidelines, daily intake of sodium 1500mg , cholesterol 200mg , calories 30% from fat and 7% or less from saturated fats, daily to have 5 or more servings of fruits and vegetables.  Biometrics: Pre Biometrics - 08/10/17 1512      Pre Biometrics   Height  6' 0.45" (1.84 m)    Weight  191 lb 12.8 oz (87 kg)    Hip Circumference  39.75 inches    BMI (Calculated)  25.7    Triceps Skinfold  11 mm    % Body Fat  24.2 %    Grip Strength  53 kg    Flexibility   14 in    Single Leg Stand  4.92 seconds        Nutrition Therapy Plan and Nutrition Goals: Nutrition Therapy & Goals - 08/13/17 1422      Nutrition Therapy   Diet  Therapeutic Lifestyle Changes      Personal Nutrition Goals   Nutrition Goal  Pt to identify and limit food sources of saturated fat, trans fat, and sodium      Intervention Plan   Intervention  Prescribe, educate and counsel regarding individualized specific dietary modifications aiming towards targeted core components such as weight, hypertension, lipid management, diabetes, heart failure and other comorbidities.    Expected Outcomes  Short Term Goal: Understand basic principles of dietary content, such as calories, fat, sodium, cholesterol and nutrients.;Long Term Goal: Adherence to prescribed nutrition plan.       Nutrition Discharge: Nutrition Scores: Nutrition Assessments - 08/13/17 1424      MEDFICTS Scores   Pre Score  9       Nutrition Goals Re-Evaluation:   Nutrition Goals Re-Evaluation:   Nutrition Goals Discharge (Final Nutrition Goals Re-Evaluation):   Psychosocial: Target Goals: Acknowledge presence or absence of significant depression and/or stress, maximize coping skills, provide positive support system. Participant is able to verbalize types and ability to use techniques and skills needed for reducing stress and depression.  Initial Review & Psychosocial Screening: Initial Psych Review & Screening - 08/10/17 1401      Initial Review   Current issues with  None Identified      Family Dynamics   Good Support System?  Yes    Comments  brief assessment shows good support mechanisms. No further interventions needed at this time      Barriers   Psychosocial barriers to participate in program  There are no identifiable barriers or psychosocial needs.      Screening Interventions   Interventions  Encouraged to exercise       Quality of Life Scores: Quality of Life - 08/20/17 1524       Quality of Life Scores   Health/Function Pre  26.5 %    Socioeconomic Pre  27.86 %    Psych/Spiritual Pre  29.29 %    Family Pre  30 %    GLOBAL Pre  27.87 % QOL scores reviewed with pt.  pt verbalizes no concerns. pt demonstrates positive outlook with good coping skills.       PHQ-9: Recent Review Flowsheet Data    Depression screen [facility] 2/9 08/16/2017 07/04/2012   Decreased Interest 0 0   Down, Depressed, Hopeless 0 0   PHQ - 2 Score 0 0     Interpretation of Total  Score  Total Score Depression Severity:  1-4 = Minimal depression, 5-9 = Mild depression, 10-14 = Moderate depression, 15-19 = Moderately severe depression, 20-27 = Severe depression   Psychosocial Evaluation and Intervention: Psychosocial Evaluation - 08/16/17 1654      Psychosocial Evaluation & Interventions   Interventions  Encouraged to exercise with the program and follow exercise prescription    Comments  no psychosocial needs identified, no interventions necessary     Expected Outcomes  pt will exhibit positive outlook with good coping skills.     Continue Psychosocial Services   No Follow up required       Psychosocial Re-Evaluation: Psychosocial Re-Evaluation    [place] Name 08/20/17 1526 09/14/17 0817 09/14/17 0818 10/12/17 1606       Psychosocial Re-Evaluation   Current issues with  None Identified  None Identified  None Identified  None Identified    Comments  no psychosocial needs identified, no interventions necessary   no psychosocial needs identified, no interventions necessary   no psychosocial needs identified, no interventions necessary   no psychosocial needs identified, no interventions necessary     Expected Outcomes  pt will exhibit positive outlook with good coping skills.   pt will exhibit positive outlook with good coping skills.   pt will exhibit positive outlook with good coping skills.   pt will exhibit positive outlook with good coping skills.     Interventions  Encouraged to attend Cardiac  Rehabilitation for the exercise  Encouraged to attend Cardiac Rehabilitation for the exercise  Encouraged to attend Cardiac Rehabilitation for the exercise  Encouraged to attend Cardiac Rehabilitation for the exercise    Continue Psychosocial Services   No Follow up required  No Follow up required  No Follow up required  No Follow up required       Psychosocial Discharge (Final Psychosocial Re-Evaluation): Psychosocial Re-Evaluation - 10/12/17 1606      Psychosocial Re-Evaluation   Current issues with  None Identified    Comments  no psychosocial needs identified, no interventions necessary     Expected Outcomes  pt will exhibit positive outlook with good coping skills.     Interventions  Encouraged to attend Cardiac Rehabilitation for the exercise    Continue Psychosocial Services   No Follow up required       Vocational Rehabilitation: Provide vocational rehab assistance to qualifying candidates.   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 Participant will state understanding/return demonstration of topics presented.  Learning Barriers/Preferences: Learning Barriers/Preferences - 08/10/17 1357      Learning Barriers/Preferences   Learning Barriers  Sight    Learning Preferences  Audio;Group Instruction;Individual Instruction;Pictoral;Skilled Demonstration;Verbal Instruction;Video;Written Material       Education Topics: Count Your Pulse:  -Group instruction provided by verbal instruction, demonstration, patient participation and written materials to support subject.  Instructors address importance of being able to find your pulse and how to count your pulse when at home without a  heart monitor.  Patients get hands on experience counting their pulse with staff help and individually.   CARDIAC REHAB PHASE II EXERCISE from 10/15/2017 in [place]  Date  09/17/17  Instruction Review Code  2- meets goals/outcomes      Heart Attack, Angina, and Risk Factor Modification:  -Group instruction provided by verbal instruction, video, and written materials to support subject.  Instructors address signs and symptoms of angina and heart attacks.    Also discuss risk factors for heart disease and how to make changes to improve heart health risk factors.   Functional Fitness:  -Group instruction provided by verbal instruction, demonstration, patient participation, and written materials to support subject.  Instructors address safety measures for doing things around the house.  Discuss how to get up and down off the floor, how to pick things up properly, how to safely get out of a chair without assistance, and balance training.   CARDIAC REHAB PHASE II EXERCISE from 10/15/2017 in [place]  Date  10/01/17  Instruction Review Code  2- meets goals/outcomes      Meditation and Mindfulness:  -Group instruction provided by verbal instruction, patient participation, and written materials to support subject.  Instructor addresses importance of mindfulness and meditation practice to help reduce stress and improve awareness.  Instructor also leads participants through a meditation exercise.    CARDIAC REHAB PHASE II EXERCISE from 10/15/2017 in [place]  Date  08/18/17  Instruction Review Code  2- meets goals/outcomes      Stretching for Flexibility and Mobility:  -Group instruction provided by verbal instruction, patient participation, and written materials to support subject.  Instructors lead participants through series of stretches that are designed to increase flexibility thus improving  mobility.  These stretches are additional exercise for major muscle groups that are typically performed during regular warm up and cool down.   CARDIAC REHAB PHASE II EXERCISE from 10/15/2017 in [place]  Date  10/15/17  Instruction Review Code  2- meets goals/outcomes      Hands Only CPR:  -Group verbal, video, and participation provides a basic overview of AHA guidelines for community CPR. Role-play of emergencies allow participants the opportunity to practice calling for help and chest compression technique with discussion of AED use.   Hypertension: -Group verbal and written instruction that provides a basic overview of hypertension including the most recent diagnostic guidelines, risk factor reduction with self-care instructions and medication management.    Nutrition I class: Heart Healthy Eating:  -Group instruction provided by PowerPoint slides, verbal discussion, and written materials to support subject matter. The instructor gives an explanation and review of the Therapeutic Lifestyle Changes diet recommendations, which includes a discussion on lipid goals, dietary fat, sodium, fiber, plant stanol/sterol esters, sugar, and the components of a well-balanced, healthy diet.   CARDIAC REHAB PHASE II EXERCISE from 10/15/2017 in [place]  Date  08/10/17  Educator  RD  Instruction Review Code  Not applicable      Nutrition II class: Lifestyle Skills:  -Group instruction provided by PowerPoint slides, verbal discussion, and written materials to support subject matter. The instructor gives an explanation and review of label reading, grocery shopping for heart health, heart healthy recipe modifications, and ways to make healthier choices when eating out.   CARDIAC REHAB PHASE II EXERCISE from 10/15/2017 in [place]  Date  08/10/17  Educator  RD  Instruction Review Code  Not applicable       Diabetes Question & Answer:  -Group instruction provided by PowerPoint slides, verbal discussion, and written materials to support subject matter. The instructor  gives an explanation and review of diabetes co-morbidities, pre- and post-prandial blood glucose goals, pre-exercise blood glucose goals, signs, symptoms, and treatment of hypoglycemia and hyperglycemia, and foot care basics.   Diabetes Blitz:  -Group instruction provided by PowerPoint slides, verbal discussion, and written materials to support subject matter. The instructor gives an explanation and review of the physiology behind type 1 and type 2 diabetes, diabetes medications and rational behind using different medications, pre- and post-prandial blood glucose recommendations and Hemoglobin A1c goals, diabetes diet, and exercise including blood glucose guidelines for exercising safely.    Portion Distortion:  -Group instruction provided by PowerPoint slides, verbal discussion, written materials, and food models to support subject matter. The instructor gives an explanation of serving size versus portion size, changes in portions sizes over the last 20 years, and what consists of a serving from each food group.   CARDIAC REHAB PHASE II EXERCISE from 10/15/2017 in [place]  Date  09/01/17  Educator  RD  Instruction Review Code  2- meets goals/outcomes      Stress Management:  -Group instruction provided by verbal instruction, video, and written materials to support subject matter.  Instructors review role of stress in heart disease and how to cope with stress positively.     CARDIAC REHAB PHASE II EXERCISE from 10/15/2017 in [place]  Date  09/10/17  Instruction Review Code  2- meets goals/outcomes      Exercising on Your Own:  -Group instruction provided by verbal instruction, power point, and written materials to support subject.  Instructors discuss  benefits of exercise, components of exercise, frequency and intensity of exercise, and end points for exercise.  Also discuss use of nitroglycerin and activating EMS.  Review options of places to exercise outside of rehab.  Review guidelines for sex with heart disease.   Cardiac Drugs I:  -Group instruction provided by verbal instruction and written materials to support subject.  Instructor reviews cardiac drug classes: antiplatelets, anticoagulants, beta blockers, and statins.  Instructor discusses reasons, side effects, and lifestyle considerations for each drug class.   CARDIAC REHAB PHASE II EXERCISE from 10/15/2017 in [place]  Date  09/29/17  Instruction Review Code  2- meets goals/outcomes      Cardiac Drugs II:  -Group instruction provided by verbal instruction and written materials to support subject.  Instructor reviews cardiac drug classes: angiotensin converting enzyme inhibitors (ACE-I), angiotensin II receptor blockers (ARBs), nitrates, and calcium channel blockers.  Instructor discusses reasons, side effects, and lifestyle considerations for each drug class.   CARDIAC REHAB PHASE II EXERCISE from 10/15/2017 in [place]  Date  08/25/17  Instruction Review Code  2- meets goals/outcomes      Anatomy and Physiology of the Circulatory System:  Group verbal and written instruction and models provide basic cardiac anatomy and physiology, with the coronary electrical and arterial systems. Review of: AMI, Angina, Valve disease, Heart Failure, Peripheral Artery Disease, Cardiac Arrhythmia, Pacemakers, and the ICD.   CARDIAC REHAB PHASE II EXERCISE from 10/15/2017 in [place]  Date  10/13/17  Instruction Review Code  2- meets goals/outcomes      Other Education:  -Group or individual verbal, written, or video instructions that support the educational goals of the cardiac rehab  program.   CARDIAC REHAB PHASE II EXERCISE from 10/15/2017 in [place]  Date  09/24/17 [Holiday  Eating]  Educator  RD  Instruction Review Code  2- Demonstrated Understanding      Knowledge Questionnaire Score: Knowledge Questionnaire Score - 08/10/17 1413      Knowledge Questionnaire Score   Pre Score  23/24       Core Components/Risk Factors/Patient Goals at Admission: Personal Goals and Risk Factors at Admission - 08/10/17 1357      Core Components/Risk Factors/Patient Goals on Admission   Hypertension  Yes    Intervention  Provide education on lifestyle modifcations including regular physical activity/exercise, weight management, moderate sodium restriction and increased consumption of fresh fruit, vegetables, and low fat dairy, alcohol moderation, and smoking cessation.;Monitor prescription use compliance.    Lipids  Yes    Intervention  Provide education and support for participant on nutrition & aerobic/resistive exercise along with prescribed medications to achieve LDL 70mg , HDL >40mg .    Expected Outcomes  Short Term: Participant states understanding of desired cholesterol values and is compliant with medications prescribed. Participant is following exercise prescription and nutrition guidelines.;Long Term: Cholesterol controlled with medications as prescribed, with individualized exercise RX and with personalized nutrition plan. Value goals: LDL < 70mg , HDL > 40 mg.       Core Components/Risk Factors/Patient Goals Review:  Goals and Risk Factor Review    Row Name 08/18/17 1059 09/14/17 0817 10/12/17 1606         Core Components/Risk Factors/Patient Goals Review   Personal Goals Review  Hypertension;Lipids  Hypertension;Lipids  Hypertension;Lipids     Review  pt with CAD RF demonstrates eagerness to participate in CR activities. pt demonstrates adequate BP managment at CR activities.   pt with CAD RF demonstrates eagerness to participate  in CR activities. pt demonstrates adequate BP managment at CR activities.   pt with CAD RF demonstrates eagerness to participate in CR activities. pt demonstrates adequate BP managment at CR activities.      Expected Outcomes  pt will participate in  CR exercise, nutrition and lifestyle modification activities to decrease overall RF.   pt will participate in  CR exercise, nutrition and lifestyle modification activities to decrease overall RF.   pt will participate in  CR exercise, nutrition and lifestyle modification activities to decrease overall RF.         Core Components/Risk Factors/Patient Goals at Discharge (Final Review):  Goals and Risk Factor Review - 10/12/17 1606      Core Components/Risk Factors/Patient Goals Review   Personal Goals Review  Hypertension;Lipids    Review  pt with CAD RF demonstrates eagerness to participate in CR activities. pt demonstrates adequate BP managment at CR activities.     Expected Outcomes  pt will participate in  CR exercise, nutrition and lifestyle modification activities to decrease overall RF.        ITP Comments: ITP Comments    Row Name 08/10/17 1337 08/24/17 1559 09/14/17 9798 10/12/17 1605     ITP Comments  Dr Fransico Him, Medical Director  30 day ITP review.  pt with good participation and attendance.  continue current regimen unless therwise directed by medical director.   30 day ITP review.  pt with good participation and attendance.  continue current regimen unless therwise directed by medical director.   30 day ITP review.  pt with good participation and attendance.  continue current regimen unless therwise directed by medical director.        Comments:

## 2017-10-18 ENCOUNTER — Encounter (HOSPITAL_COMMUNITY)
Admission: RE | Admit: 2017-10-18 | Discharge: 2017-10-18 | Disposition: A | Discharge status: Home / Self Care | Payer: Medicare Other | Source: Ambulatory Visit | Attending: Cardiovascular Disease | Admitting: Cardiovascular Disease

## 2017-10-18 DIAGNOSIS — I10 Essential (primary) hypertension: Secondary | ICD-10-CM | POA: Diagnosis not present

## 2017-10-18 DIAGNOSIS — E785 Hyperlipidemia, unspecified: Secondary | ICD-10-CM | POA: Insufficient documentation

## 2017-10-18 DIAGNOSIS — I440 Atrioventricular block, first degree: Secondary | ICD-10-CM | POA: Insufficient documentation

## 2017-10-18 DIAGNOSIS — Z7982 Long term (current) use of aspirin: Secondary | ICD-10-CM | POA: Insufficient documentation

## 2017-10-18 DIAGNOSIS — Z7902 Long term (current) use of antithrombotics/antiplatelets: Secondary | ICD-10-CM | POA: Diagnosis not present

## 2017-10-18 DIAGNOSIS — Z79899 Other long term (current) drug therapy: Secondary | ICD-10-CM | POA: Diagnosis not present

## 2017-10-18 DIAGNOSIS — I2510 Atherosclerotic heart disease of native coronary artery without angina pectoris: Secondary | ICD-10-CM | POA: Insufficient documentation

## 2017-10-18 DIAGNOSIS — Z7901 Long term (current) use of anticoagulants: Secondary | ICD-10-CM | POA: Insufficient documentation

## 2017-10-18 DIAGNOSIS — Z955 Presence of coronary angioplasty implant and graft: Secondary | ICD-10-CM | POA: Insufficient documentation

## 2017-10-20 ENCOUNTER — Encounter (HOSPITAL_COMMUNITY)
Admission: RE | Admit: 2017-10-20 | Discharge: 2017-10-20 | Disposition: A | Discharge status: Home / Self Care | Payer: Medicare Other | Source: Ambulatory Visit | Attending: Cardiovascular Disease | Admitting: Cardiovascular Disease

## 2017-10-20 DIAGNOSIS — Z955 Presence of coronary angioplasty implant and graft: Secondary | ICD-10-CM | POA: Diagnosis not present

## 2017-10-22 ENCOUNTER — Encounter (HOSPITAL_COMMUNITY)
Admission: RE | Admit: 2017-10-22 | Discharge: 2017-10-22 | Disposition: A | Discharge status: Home / Self Care | Payer: Medicare Other | Source: Ambulatory Visit | Attending: Cardiovascular Disease | Admitting: Cardiovascular Disease

## 2017-10-22 DIAGNOSIS — Z955 Presence of coronary angioplasty implant and graft: Secondary | ICD-10-CM | POA: Diagnosis not present

## 2017-10-25 ENCOUNTER — Encounter (HOSPITAL_COMMUNITY): Payer: Medicare Other

## 2017-10-27 ENCOUNTER — Encounter (HOSPITAL_COMMUNITY): Payer: Medicare Other

## 2017-10-29 ENCOUNTER — Encounter (HOSPITAL_COMMUNITY)
Admission: RE | Admit: 2017-10-29 | Discharge: 2017-10-29 | Disposition: A | Discharge status: Home / Self Care | Payer: Medicare Other | Source: Ambulatory Visit | Attending: Cardiovascular Disease | Admitting: Cardiovascular Disease

## 2017-10-29 DIAGNOSIS — Z955 Presence of coronary angioplasty implant and graft: Secondary | ICD-10-CM | POA: Diagnosis not present

## 2017-11-01 ENCOUNTER — Encounter (HOSPITAL_COMMUNITY)
Admission: RE | Admit: 2017-11-01 | Discharge: 2017-11-01 | Disposition: A | Discharge status: Home / Self Care | Payer: Medicare Other | Source: Ambulatory Visit | Attending: Cardiovascular Disease | Admitting: Cardiovascular Disease

## 2017-11-01 DIAGNOSIS — Z955 Presence of coronary angioplasty implant and graft: Secondary | ICD-10-CM

## 2017-11-01 NOTE — Progress Notes (Signed)
Walter Mt, MD 71 y.o. male DOB: [birth year] MRN: [id number]      Nutrition Note  Dx: s/p DES LAD  Note Spoke with pt. Pt c/o undesired slow wt loss. Heart healthy ways to increase caloric intake discussed. Pt encouraged to liberalize his diet to prevent further wt loss/promote wt gain. Pt expressed understanding of the information reviewed.   Nutrition Diagnosis ? Food-and nutrition-related knowledge deficit related to lack of exposure to information as related to diagnosis of: ? CVD  Nutrition Intervention ? Pt's individual nutrition plan reviewed with pt. ? Handout given for: Weight Gain/ High Calorie Meal-Plan for Athletes  Nutrition Goal(s):  ? Pt to identify and limit food sources of saturated fat, trans fat, and sodium  Plan:  Pt to attend nutrition classes ? Portion Distortion - met 09/01/17 Will provide client-centered nutrition education as part of interdisciplinary care.   Monitor and evaluate progress toward nutrition goal with team.  Derek Mound, M.Ed, RD, LDN, CDE 11/01/2017 10:33 AM

## 2017-11-02 ENCOUNTER — Encounter (HOSPITAL_COMMUNITY): Payer: Self-pay

## 2017-11-03 ENCOUNTER — Encounter (HOSPITAL_COMMUNITY)
Admission: RE | Admit: 2017-11-03 | Discharge: 2017-11-03 | Disposition: A | Discharge status: Home / Self Care | Payer: Medicare Other | Source: Ambulatory Visit | Attending: Cardiovascular Disease | Admitting: Cardiovascular Disease

## 2017-11-03 DIAGNOSIS — Z955 Presence of coronary angioplasty implant and graft: Secondary | ICD-10-CM

## 2017-11-05 ENCOUNTER — Encounter (HOSPITAL_COMMUNITY)
Admission: RE | Admit: 2017-11-05 | Discharge: 2017-11-05 | Disposition: A | Discharge status: Home / Self Care | Payer: Medicare Other | Source: Ambulatory Visit | Attending: Cardiovascular Disease | Admitting: Cardiovascular Disease

## 2017-11-05 VITALS — Ht 72.44 in | Wt 187.8 lb

## 2017-11-05 DIAGNOSIS — Z955 Presence of coronary angioplasty implant and graft: Secondary | ICD-10-CM

## 2017-11-08 ENCOUNTER — Encounter (HOSPITAL_COMMUNITY): Payer: Medicare Other

## 2017-11-10 ENCOUNTER — Telehealth (HOSPITAL_COMMUNITY): Payer: Self-pay | Admitting: Cardiac Rehabilitation

## 2017-11-10 ENCOUNTER — Encounter (HOSPITAL_COMMUNITY): Payer: Medicare Other

## 2017-11-10 NOTE — Telephone Encounter (Signed)
pc received from pt c/o recent leg injury. appt with orthopaedics today to evaluate. Pt will be put on hold for cardiac rehab pending MD recommendations.  Pt expressed gratitude for care received.  He  is hopeful to be able to return to complete few remaining sessions.  Andi Hence, RN, BSN Cardiac Pulmonary Rehab

## 2017-11-12 ENCOUNTER — Encounter (HOSPITAL_COMMUNITY)
Admission: RE | Admit: 2017-11-12 | Discharge: 2017-11-12 | Disposition: A | Discharge status: Home / Self Care | Payer: Medicare Other | Source: Ambulatory Visit | Attending: Cardiovascular Disease | Admitting: Cardiovascular Disease

## 2017-11-12 DIAGNOSIS — Z955 Presence of coronary angioplasty implant and graft: Secondary | ICD-10-CM

## 2017-11-12 NOTE — Progress Notes (Signed)
Cardiac Individual Treatment Plan  Patient Details  Name: Walter SENGER, MD MRN: [id number] Date of Birth: Oct 11, 1946 Referring Provider:     CARDIAC REHAB PHASE II ORIENTATION from 08/10/2017 in [place]  Referring Provider  [name], MD      Initial Encounter Date:    CARDIAC REHAB PHASE II ORIENTATION from 08/10/2017 in [place]  Date  08/10/17  Referring Provider  [name], MD      Visit Diagnosis: S/P coronary artery stent placement 07/22/2017 DES LAD  Patient's Home Medications on Admission:  Current Outpatient Medications:  .  acyclovir (ZOVIRAX) 400 MG tablet, Take 400 mg as directed by mouth. Herpes flare on legs, Disp: , Rfl:  .  cholecalciferol (VITAMIN D) 1000 UNITS tablet, Take 1,000 Units by mouth daily., Disp: , Rfl:  .  clopidogrel (PLAVIX) 75 MG tablet, Take 1 tablet (75 mg total) by mouth daily with breakfast., Disp: 90 tablet, Rfl: 2 .  losartan (COZAAR) 25 MG tablet, Take 1 tablet (25 mg total) by mouth 2 (two) times daily., Disp: 60 tablet, Rfl: 6 .  nitroGLYCERIN (NITROSTAT) 0.4 MG SL tablet, Place 0.4 mg under the tongue every 5 (five) minutes as needed for chest pain (x 3 tabs)., Disp: , Rfl:  .  propranolol (INDERAL) 10 MG tablet, Take 10 mg by mouth 3 (three) times daily as needed. Palpitations or benign central tremor , Disp: , Rfl:  .  vitamin B-12 (CYANOCOBALAMIN) 1000 MCG tablet, Take 1,000 mcg by mouth daily., Disp: , Rfl:  .  XARELTO 20 MG TABS tablet, TAKE 1 TABLET(20 MG) BY MOUTH DAILY WITH DINNER, Disp: 90 tablet, Rfl: 1  Past Medical History: Past Medical History:  Diagnosis Date  . Atrial flutter -atypical 2007   ABLATION x2 [facility]  . Atrial tachycardia ([place]) 2007   ABLATION [facility]  . Benign paroxysmal vertigo   . CAD (coronary artery disease)    a. abnl mv 01/2012;  b. cath 05/23/2012 LCX 80@ bifurcation w/ OM, 95% @ AV groove, otw nonobs dzs, EF 55-65%;  . Carcinoma  ([place])    skin cancer "off my chest"  . Familial tremor   . First degree AV block    had 4 ablasions that left py with heart block  . History of blood transfusion 2010   "2nd day after prostatectomy"  . Hyperlipidemia   . Hypertension   . Incontinence    DECREASING INCONTINENCE RELATED TO PROSTATECTOMY.  . Migraines   . Presence of permanent cardiac pacemaker 2013 ?  Marland Kitchen Prostate cancer ([place])   . Pulsatile tinnitus   . Tachy-brady syndrome ([place])    a. developed after RFCA @ [facility] 2013-> MDT PPM;  b. s/p RA lead revision 05/2012    Tobacco Use: Social History   Tobacco Use  Smoking Status Never Smoker  Smokeless Tobacco Never Used    Labs: Recent Review Scientist, physiological    Labs for ITP Cardiac and Pulmonary Rehab Latest Ref Rng & Units 09/13/2017   Cholestrol 100 - 199 mg/dL 191   LDLCALC 0 - 99 mg/dL 115(H)   LDLDIRECT 0 - 99 mg/dL 130(H)   HDL >39 mg/dL 65   Trlycerides 0 - 149 mg/dL 55      Capillary Blood Glucose: No results found for: GLUCAP   Exercise Target Goals:    Exercise Program Goal: Individual exercise prescription set with THRR, safety & activity barriers. Participant demonstrates ability to understand and report  RPE using BORG scale, to self-measure pulse accurately, and to acknowledge the importance of the exercise prescription.  Exercise Prescription Goal: Starting with aerobic activity 30 plus minutes a day, 3 days per week for initial exercise prescription. Provide home exercise prescription and guidelines that participant acknowledges understanding prior to discharge.  Activity Barriers & Risk Stratification: Activity Barriers & Cardiac Risk Stratification - 08/10/17 1408      Activity Barriers & Cardiac Risk Stratification   Activity Barriers  None    Cardiac Risk Stratification  Moderate       6 Minute Walk: 6 Minute Walk    Row Name 08/10/17 1417 11/05/17 0956       6 Minute Walk   Phase  Initial  Discharge    Distance  1434 feet   2062 feet    Distance % Change  -  43.79 %    Distance Feet Change  -  628 ft    Walk Time  6 minutes  6 minutes    # of Rest Breaks  0  0    MPH  2.72  3.99    METS  3.11  4.41    RPE  11  12    Perceived Dyspnea   -  0    VO2 Peak  10.9  15.44    Symptoms  No  No    Resting HR  69 bpm  71 bpm    Resting BP  128/84  110/70    Resting Oxygen Saturation   97 %  -    Max Ex. HR  98 bpm  111 bpm    Max Ex. BP  122/82  128/80    2 Minute Post BP  124/82  118/74       Oxygen Initial Assessment:   Oxygen Re-Evaluation:   Oxygen Discharge (Final Oxygen Re-Evaluation):   Initial Exercise Prescription: Initial Exercise Prescription - 08/10/17 1400      Date of Initial Exercise RX and Referring Provider   Date  08/10/17    Referring Provider  [name], MD      Treadmill   MPH  2.8    Grade  0    Minutes  10    METs  3.14      Bike   Level  1    Minutes  10    METs  3.16      NuStep   Level  4    SPM  90    Minutes  10    METs  3.1      Prescription Details   Frequency (times per week)  3    Duration  Progress to 30 minutes of continuous aerobic without signs/symptoms of physical distress      Intensity   THRR 40-80% of Max Heartrate  59-118    Ratings of Perceived Exertion  11-13    Perceived Dyspnea  0-4      Progression   Progression  Continue to progress workloads to maintain intensity without signs/symptoms of physical distress.      Resistance Training   Training Prescription  Yes    Weight  4lbs    Reps  10-15       Perform Capillary Blood Glucose checks as needed.  Exercise Prescription Changes: Exercise Prescription Changes    Row Name 08/16/17 1600 09/06/17 1500 09/24/17 1424 10/11/17 1700 10/22/17 1641     Response to Exercise   Blood Pressure (Admit)  118/84  120/80  118/82  108/70  110/80   Blood Pressure (Exercise)  146/84  132/80  130/70  124/70  134/80   Blood Pressure (Exit)  102/70  112/70  102/70  110/70  110/60   Heart  Rate (Admit)  69 bpm  76 bpm  80 bpm  91 bpm  77 bpm   Heart Rate (Exercise)  97 bpm  108 bpm  106 bpm  105 bpm  103 bpm   Heart Rate (Exit)  68 bpm  78 bpm  71 bpm  71 bpm  64 bpm   Rating of Perceived Exertion (Exercise)  12  12  11  13  12    Symptoms  none  none  none  none  none   Comments  -  -  -  Pt does weight station for 10 minutes.  Pt does weight station for 10 minutes.   Duration  Continue with 30 min of aerobic exercise without signs/symptoms of physical distress.  Continue with 30 min of aerobic exercise without signs/symptoms of physical distress.  Continue with 30 min of aerobic exercise without signs/symptoms of physical distress.  Continue with 30 min of aerobic exercise without signs/symptoms of physical distress.  Continue with 30 min of aerobic exercise without signs/symptoms of physical distress.   Intensity  THRR unchanged  THRR unchanged  THRR unchanged  THRR unchanged  THRR unchanged     Progression   Progression  Continue to progress workloads to maintain intensity without signs/symptoms of physical distress.  Continue to progress workloads to maintain intensity without signs/symptoms of physical distress.  Continue to progress workloads to maintain intensity without signs/symptoms of physical distress.  Continue to progress workloads to maintain intensity without signs/symptoms of physical distress.  Continue to progress workloads to maintain intensity without signs/symptoms of physical distress.   Average METs  2.9  3.4  3.9  3.8  3.8     Resistance Training   Training Prescription  Yes  Yes  Yes  Yes  Yes   Weight  6lbs  6lbs  8lbs  5lbs  5lbs   Reps  10-15  10-15  10-15  10-15  10-15   Time  10 Minutes  10 Minutes  10 Minutes  10 Minutes  10 Minutes     Treadmill   MPH  2.8  3  3.2  3.2  3.2   Grade  0  1  3  3  3    Minutes  10  10  15  10  10    METs  3.14  3.71  4.77  4.77  4.77     Bike   Level  1  1  1   -  -   Minutes  10  10  15   -  -   METs  3.16  3.2   3.2  -  -     NuStep   Level  4  4  -  4  4   SPM  90  90  -  90  90   Minutes  10  10  -  10  10   METs  2.4  3.4  -  2.9  2.8     Home Exercise Plan   Plans to continue exercise at  -  [employer] (comment) [place] [employer] or outdoor walking  [employer] (comment) [place] or outdoor walking  [employer] (comment)  [employer] (comment)   Frequency  -  Add 2 additional  days to program exercise sessions.  Add 2 additional days to program exercise sessions.  Add 2 additional days to program exercise sessions.  Add 2 additional days to program exercise sessions.   Initial Home Exercises Provided  -  08/25/17  08/25/17  08/25/17  08/25/17   Row Name 11/01/17 1600             Response to Exercise   Blood Pressure (Admit)  104/68       Blood Pressure (Exercise)  120/66       Blood Pressure (Exit)  108/78       Heart Rate (Admit)  76 bpm       Heart Rate (Exercise)  105 bpm       Heart Rate (Exit)  64 bpm       Rating of Perceived Exertion (Exercise)  12       Symptoms  none       Comments  Pt does weight station for 10 minutes.       Duration  Continue with 30 min of aerobic exercise without signs/symptoms of physical distress.       Intensity  THRR unchanged         Progression   Progression  Continue to progress workloads to maintain intensity without signs/symptoms of physical distress.       Average METs  3.8         Resistance Training   Training Prescription  Yes       Weight  6lbs       Reps  10-15       Time  10 Minutes         Treadmill   MPH  3.2       Grade  3       Minutes  10       METs  4.77         NuStep   Level  4       SPM  90       Minutes  10       METs  2.9         Home Exercise Plan   Plans to continue exercise at  [facility] (comment)       Frequency  Add 2 additional days to program exercise sessions.       Initial Home Exercises Provided  08/25/17          Exercise  Comments: Exercise Comments    Row Name 08/16/17 1253 09/14/17 1411 10/12/17 1401 11/01/17 1104     Exercise Comments  pt was oriented to exercise equipment on 08/16/17. Pt responded well to exercise session; will continue to monitor exercise progression and activity levels.   Reviewed METs and goals. Pt is tolerating exercise well; will continue to monitor pt's progress and activity levels.   Reviewed METs and goals. Pt is tolerating exercise well; will continue to monitor pt's progress and activity levels.   Reviewed METs and goals. Pt is tolerating exercise well; will continue to monitor pt's progress and activity levels.        Exercise Goals and Review: Exercise Goals    Row Name 08/10/17 1409             Exercise Goals   Increase Physical Activity  Yes       Intervention  Provide advice, education, support and counseling about physical activity/exercise needs.;Develop an individualized exercise prescription for aerobic and resistive training based on initial  evaluation findings, risk stratification, comorbidities and participant's personal goals.       Expected Outcomes  Achievement of increased cardiorespiratory fitness and enhanced flexibility, muscular endurance and strength shown through measurements of functional capacity and personal statement of participant.       Increase Strength and Stamina  Yes       Intervention  Provide advice, education, support and counseling about physical activity/exercise needs.;Develop an individualized exercise prescription for aerobic and resistive training based on initial evaluation findings, risk stratification, comorbidities and participant's personal goals.       Expected Outcomes  Achievement of increased cardiorespiratory fitness and enhanced flexibility, muscular endurance and strength shown through measurements of functional capacity and personal statement of participant.       Able to understand and use rate of perceived exertion (RPE) scale   Yes       Intervention  Provide education and explanation on how to use RPE scale       Expected Outcomes  Long Term:  Able to use RPE to guide intensity level when exercising independently;Short Term: Able to use RPE daily in rehab to express subjective intensity level       Knowledge and understanding of Target Heart Rate Range (THRR)  Yes       Intervention  Provide education and explanation of THRR including how the numbers were predicted and where they are located for reference       Expected Outcomes  Short Term: Able to state/look up THRR;Short Term: Able to use daily as guideline for intensity in rehab;Long Term: Able to use THRR to govern intensity when exercising independently       Able to check pulse independently  Yes       Intervention  Provide education and demonstration on how to check pulse in carotid and radial arteries.;Review the importance of being able to check your own pulse for safety during independent exercise       Expected Outcomes  Short Term: Able to explain why pulse checking is important during independent exercise;Long Term: Able to check pulse independently and accurately       Understanding of Exercise Prescription  Yes       Intervention  Provide education, explanation, and written materials on patient's individual exercise prescription       Expected Outcomes  Short Term: Able to explain program exercise prescription;Long Term: Able to explain home exercise prescription to exercise independently          Exercise Goals Re-Evaluation : Exercise Goals Re-Evaluation    Row Name 08/24/17 1429 08/25/17 1034 09/14/17 1411 10/12/17 1359 10/15/17 1625     Exercise Goal Re-Evaluation   Exercise Goals Review  Increase Physical Activity;Understanding of Exercise Prescription;Knowledge and understanding of Target Heart Rate Range (THRR);Able to understand and use rate of perceived exertion (RPE) scale;Increase Strength and Stamina  Increase Physical Activity;Able to  understand and use rate of perceived exertion (RPE) scale;Knowledge and understanding of Target Heart Rate Range (THRR);Understanding of Exercise Prescription;Increase Strength and Stamina;Able to check pulse independently  Increase Physical Activity;Able to understand and use rate of perceived exertion (RPE) scale;Knowledge and understanding of Target Heart Rate Range (THRR);Understanding of Exercise Prescription;Increase Strength and Stamina;Able to check pulse independently  -  Increase Physical Activity;Able to understand and use rate of perceived exertion (RPE) scale;Knowledge and understanding of Target Heart Rate Range (THRR);Understanding of Exercise Prescription;Increase Strength and Stamina;Able to check pulse independently   Comments  Pt demonstrates use of RPE scale effectively and  is making great progess in activity and exercise in cardiac rehab.  Reviewed home exercise with pt today.  Pt plans to walk or go to [employer] center for exercise.  Reviewed THR, pulse, RPE, sign and symptoms, NTG use, and when to call 911 or MD.  Also discussed weather considerations and indoor options.  Pt voiced understanding.  pt is getting back on track with HEP at the gym. Discussed importance of 150 minutes a week of cardiovascular fitness to improve heart health. Pt voiced understanding.  Pt stated being able to rake leaves with no difficulty. Pt is also walking for HEP and living a more active lifestyle. Pt was introduced to weights on resistance column in which pt is strengthening upper body and core to correct postural alignment.   Pt stated being able to rake leaves with no difficulty. Pt is also walking for HEP and living a more active lifestyle. Pt was introduced to weights on resistance column in which pt is strengthening upper body and core to correct postural alignment.    Expected Outcomes  Pt will continue to exercise in cardiac rehab safely.  Pt will be compliant with HEP and improve  on cardiorespiratory fitness.  Pt will be compliant with HEP and improve on cardiorespiratory fitness.  Pt will be compliant with HEP and improve on cardiorespiratory fitness.  Pt will be compliant with HEP and improve on cardiorespiratory fitness.   [place] Name 11/01/17 1104             Exercise Goal Re-Evaluation   Exercise Goals Review  Increase Physical Activity;Able to understand and use rate of perceived exertion (RPE) scale;Knowledge and understanding of Target Heart Rate Range (THRR);Understanding of Exercise Prescription;Increase Strength and Stamina;Able to check pulse independently       Comments  Pt is making discharge plans for home exercise. Pt is on track to graduate at the end of the month and will get back into gym like routine. Pt plans to exercise at the fitness center 3x/week post discharge froom cardiac rehab,        Expected Outcomes  Pt will be compliant with HEP and improve on cardiorespiratory fitness.           Discharge Exercise Prescription (Final Exercise Prescription Changes): Exercise Prescription Changes - 11/01/17 1600      Response to Exercise   Blood Pressure (Admit)  104/68    Blood Pressure (Exercise)  120/66    Blood Pressure (Exit)  108/78    Heart Rate (Admit)  76 bpm    Heart Rate (Exercise)  105 bpm    Heart Rate (Exit)  64 bpm    Rating of Perceived Exertion (Exercise)  12    Symptoms  none    Comments  Pt does weight station for 10 minutes.    Duration  Continue with 30 min of aerobic exercise without signs/symptoms of physical distress.    Intensity  THRR unchanged      Progression   Progression  Continue to progress workloads to maintain intensity without signs/symptoms of physical distress.    Average METs  3.8      Resistance Training   Training Prescription  Yes    Weight  6lbs    Reps  10-15    Time  10 Minutes      Treadmill   MPH  3.2    Grade  3    Minutes  10    METs  4.77  NuStep   Level  4    SPM  90    Minutes   10    METs  2.9      Home Exercise Plan   Plans to continue exercise at  [facility] (comment)    Frequency  Add 2 additional days to program exercise sessions.    Initial Home Exercises Provided  08/25/17       Nutrition:  Target Goals: Understanding of nutrition guidelines, daily intake of sodium 1500mg , cholesterol 200mg , calories 30% from fat and 7% or less from saturated fats, daily to have 5 or more servings of fruits and vegetables.  Biometrics: Pre Biometrics - 08/10/17 1512      Pre Biometrics   Height  6' 0.45" (1.84 m)    Weight  191 lb 12.8 oz (87 kg)    Hip Circumference  39.75 inches    BMI (Calculated)  25.7    Triceps Skinfold  11 mm    % Body Fat  24.2 %    Grip Strength  53 kg    Flexibility  14 in    Single Leg Stand  4.92 seconds      Post Biometrics - 11/05/17 1000       Post  Biometrics   Height  6' 0.44" (1.84 m)    Weight  187 lb 13.3 oz (85.2 kg)    Waist Circumference  37 inches    Hip Circumference  39 inches    Waist to Hip Ratio  0.95 %    BMI (Calculated)  25.17    Triceps Skinfold  11 mm    % Body Fat  23.6 %    Grip Strength  52 kg    Flexibility  16 in    Single Leg Stand  15.14 seconds       Nutrition Therapy Plan and Nutrition Goals: Nutrition Therapy & Goals - 08/13/17 1422      Nutrition Therapy   Diet  Therapeutic Lifestyle Changes      Personal Nutrition Goals   Nutrition Goal  Pt to identify and limit food sources of saturated fat, trans fat, and sodium      Intervention Plan   Intervention  Prescribe, educate and counsel regarding individualized specific dietary modifications aiming towards targeted core components such as weight, hypertension, lipid management, diabetes, heart failure and other comorbidities.    Expected Outcomes  Short Term Goal: Understand basic principles of dietary content, such as calories, fat, sodium, cholesterol and nutrients.;Long Term Goal: Adherence to prescribed nutrition plan.        Nutrition Discharge: Nutrition Scores: Nutrition Assessments - 08/13/17 1424      MEDFICTS Scores   Pre Score  9       Nutrition Goals Re-Evaluation:   Nutrition Goals Re-Evaluation:   Nutrition Goals Discharge (Final Nutrition Goals Re-Evaluation):   Psychosocial: Target Goals: Acknowledge presence or absence of significant depression and/or stress, maximize coping skills, provide positive support system. Participant is able to verbalize types and ability to use techniques and skills needed for reducing stress and depression.  Initial Review & Psychosocial Screening: Initial Psych Review & Screening - 08/10/17 1401      Initial Review   Current issues with  None Identified      Family Dynamics   Good Support System?  Yes    Comments  brief assessment shows good support mechanisms. No further interventions needed at this time      Barriers   Psychosocial barriers  to participate in program  There are no identifiable barriers or psychosocial needs.      Screening Interventions   Interventions  Encouraged to exercise       Quality of Life Scores: Quality of Life - 08/20/17 1524      Quality of Life Scores   Health/Function Pre  26.5 %    Socioeconomic Pre  27.86 %    Psych/Spiritual Pre  29.29 %    Family Pre  30 %    GLOBAL Pre  27.87 % QOL scores reviewed with pt.  pt verbalizes no concerns. pt demonstrates positive outlook with good coping skills.       PHQ-9: Recent Review Flowsheet Data    Depression screen [facility] 2/9 08/16/2017 07/04/2012   Decreased Interest 0 0   Down, Depressed, Hopeless 0 0   PHQ - 2 Score 0 0     Interpretation of Total Score  Total Score Depression Severity:  1-4 = Minimal depression, 5-9 = Mild depression, 10-14 = Moderate depression, 15-19 = Moderately severe depression, 20-27 = Severe depression   Psychosocial Evaluation and Intervention: Psychosocial Evaluation - 08/16/17 1654      Psychosocial Evaluation & Interventions    Interventions  Encouraged to exercise with the program and follow exercise prescription    Comments  no psychosocial needs identified, no interventions necessary     Expected Outcomes  pt will exhibit positive outlook with good coping skills.     Continue Psychosocial Services   No Follow up required       Psychosocial Re-Evaluation: Psychosocial Re-Evaluation    [place] Name 08/20/17 1526 09/14/17 0817 09/14/17 0818 10/12/17 1606 11/02/17 1522     Psychosocial Re-Evaluation   Current issues with  None Identified  None Identified  None Identified  None Identified  None Identified   Comments  no psychosocial needs identified, no interventions necessary   no psychosocial needs identified, no interventions necessary   no psychosocial needs identified, no interventions necessary   no psychosocial needs identified, no interventions necessary   no psychosocial needs identified, no interventions necessary    Expected Outcomes  pt will exhibit positive outlook with good coping skills.   pt will exhibit positive outlook with good coping skills.   pt will exhibit positive outlook with good coping skills.   pt will exhibit positive outlook with good coping skills.   pt will exhibit positive outlook with good coping skills.    Interventions  Encouraged to attend Cardiac Rehabilitation for the exercise  Encouraged to attend Cardiac Rehabilitation for the exercise  Encouraged to attend Cardiac Rehabilitation for the exercise  Encouraged to attend Cardiac Rehabilitation for the exercise  Encouraged to attend Cardiac Rehabilitation for the exercise   Continue Psychosocial Services   No Follow up required  No Follow up required  No Follow up required  No Follow up required  No Follow up required      Psychosocial Discharge (Final Psychosocial Re-Evaluation): Psychosocial Re-Evaluation - 11/02/17 1522      Psychosocial Re-Evaluation   Current issues with  None Identified    Comments  no psychosocial needs  identified, no interventions necessary     Expected Outcomes  pt will exhibit positive outlook with good coping skills.     Interventions  Encouraged to attend Cardiac Rehabilitation for the exercise    Continue Psychosocial Services   No Follow up required       Vocational Rehabilitation: Provide vocational rehab assistance to qualifying candidates.  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 Participant will state understanding/return demonstration of topics presented.  Learning Barriers/Preferences: Learning Barriers/Preferences - 08/10/17 1357      Learning Barriers/Preferences   Learning Barriers  Sight    Learning Preferences  Audio;Group Instruction;Individual Instruction;Pictoral;Skilled Demonstration;Verbal Instruction;Video;Written Material       Education Topics: Count Your Pulse:  -Group instruction provided by verbal instruction, demonstration, patient participation and written materials to support subject.  Instructors address importance of being able to find your pulse and how to count your pulse when at home without a heart monitor.  Patients get hands on experience counting their pulse with staff help and individually.   CARDIAC REHAB PHASE II EXERCISE from 11/03/2017 in [place]  Date  09/17/17  Instruction Review Code  2- meets goals/outcomes      Heart Attack, Angina, and Risk Factor Modification:  -Group instruction provided by verbal instruction, video, and written materials to support subject.  Instructors address signs and symptoms of angina and heart attacks.    Also discuss risk factors for heart disease and how to make changes  to improve heart health risk factors.   Functional Fitness:  -Group instruction provided by verbal instruction, demonstration, patient participation, and written materials to support subject.  Instructors address safety measures for doing things around the house.  Discuss how to get up and down off the floor, how to pick things up properly, how to safely get out of a chair without assistance, and balance training.   CARDIAC REHAB PHASE II EXERCISE from 11/03/2017 in [place]  Date  10/01/17  Instruction Review Code  2- meets goals/outcomes      Meditation and Mindfulness:  -Group instruction provided by verbal instruction, patient participation, and written materials to support subject.  Instructor addresses importance of mindfulness and meditation practice to help reduce stress and improve awareness.  Instructor also leads participants through a meditation exercise.    CARDIAC REHAB PHASE II EXERCISE from 11/03/2017 in [place]  Date  08/18/17  Instruction Review Code  2- meets goals/outcomes      Stretching for Flexibility and Mobility:  -Group instruction provided by verbal instruction, patient participation, and written materials to support subject.  Instructors lead participants through series of stretches that are designed to increase flexibility thus improving mobility.  These stretches are additional exercise for major muscle groups that are typically performed during regular warm up and cool down.   CARDIAC REHAB PHASE II EXERCISE from 11/03/2017 in [place]  Date  10/15/17  Instruction Review Code  2- meets goals/outcomes      Hands Only CPR:  -Group verbal, video, and participation provides a basic overview of AHA guidelines for community CPR. Role-play of emergencies allow participants the opportunity to practice calling for help and chest compression technique with discussion  of AED use.   Hypertension: -Group verbal and written instruction that provides a basic overview of hypertension including the most recent diagnostic guidelines, risk factor reduction with self-care instructions and medication management.   CARDIAC REHAB PHASE II EXERCISE from 11/03/2017 in [place]  Date  10/22/17  Instruction Review Code  2- meets goals/outcomes       Nutrition I class: Heart Healthy Eating:  -Group instruction provided by PowerPoint slides, verbal discussion, and written materials to support subject matter. The instructor gives an explanation and review of the Therapeutic Lifestyle Changes diet recommendations, which includes a discussion on lipid goals, dietary fat, sodium, fiber, plant stanol/sterol esters, sugar, and the components of a well-balanced, healthy diet.   CARDIAC REHAB PHASE II EXERCISE from 11/03/2017 in [place]  Date  08/10/17  Educator  RD  Instruction Review Code  Not applicable      Nutrition II class: Lifestyle Skills:  -Group instruction provided by PowerPoint slides, verbal discussion, and written materials to support subject matter. The instructor gives an explanation and review of label reading, grocery shopping for heart health, heart healthy recipe modifications, and ways to make healthier choices when eating out.   CARDIAC REHAB PHASE II EXERCISE from 11/03/2017 in [place]  Date  08/10/17  Educator  RD  Instruction Review Code  Not applicable      Diabetes Question & Answer:  -Group instruction provided by PowerPoint slides, verbal discussion, and written materials to support subject matter. The instructor gives an explanation and review of diabetes co-morbidities, pre- and post-prandial blood glucose goals, pre-exercise blood glucose goals, signs, symptoms, and treatment of hypoglycemia and hyperglycemia, and foot care basics.    CARDIAC REHAB PHASE II EXERCISE from 11/03/2017 in [place]  Date  10/29/17  Educator  RD  Instruction Review Code  2- meets goals/outcomes      Diabetes Blitz:  -Group instruction provided by PowerPoint slides, verbal discussion, and written materials to support subject matter. The instructor gives an explanation and review of the physiology behind type 1 and type 2 diabetes, diabetes medications and rational behind using different medications, pre- and post-prandial blood glucose recommendations and Hemoglobin A1c goals, diabetes diet, and exercise including blood glucose guidelines for exercising safely.    Portion Distortion:  -Group instruction provided by PowerPoint slides, verbal discussion, written materials, and food models to support subject matter. The instructor gives an explanation of serving size versus portion size, changes in portions sizes over the last 20 years, and what consists of a serving from each food group.   CARDIAC REHAB PHASE II EXERCISE from 11/03/2017 in [place]  Date  11/03/17  Educator  RD  Instruction Review Code  2- meets goals/outcomes      Stress Management:  -Group instruction provided by verbal instruction, video, and written materials to support subject matter.  Instructors review role of stress in heart disease and how to cope with stress positively.     CARDIAC REHAB PHASE II EXERCISE from 11/03/2017 in [place]  Date  09/10/17  Instruction Review Code  2- meets goals/outcomes      Exercising on Your Own:  -Group instruction provided by verbal instruction, power point, and written materials to support subject.  Instructors discuss benefits of exercise, components of exercise, frequency and intensity of exercise, and end points for exercise.  Also discuss use of nitroglycerin and activating EMS.  Review options of places to exercise outside of  rehab.  Review guidelines for sex with heart disease.   Cardiac Drugs I:  -Group instruction provided by verbal instruction and written materials to support subject.  Instructor reviews cardiac drug classes: antiplatelets,  anticoagulants, beta blockers, and statins.  Instructor discusses reasons, side effects, and lifestyle considerations for each drug class.   CARDIAC REHAB PHASE II EXERCISE from 11/03/2017 in [place]  Date  09/29/17  Instruction Review Code  2- meets goals/outcomes      Cardiac Drugs II:  -Group instruction provided by verbal instruction and written materials to support subject.  Instructor reviews cardiac drug classes: angiotensin converting enzyme inhibitors (ACE-I), angiotensin II receptor blockers (ARBs), nitrates, and calcium channel blockers.  Instructor discusses reasons, side effects, and lifestyle considerations for each drug class.   CARDIAC REHAB PHASE II EXERCISE from 11/03/2017 in [place]  Date  08/25/17  Instruction Review Code  2- meets goals/outcomes      Anatomy and Physiology of the Circulatory System:  Group verbal and written instruction and models provide basic cardiac anatomy and physiology, with the coronary electrical and arterial systems. Review of: AMI, Angina, Valve disease, Heart Failure, Peripheral Artery Disease, Cardiac Arrhythmia, Pacemakers, and the ICD.   CARDIAC REHAB PHASE II EXERCISE from 11/03/2017 in [place]  Date  10/13/17  Instruction Review Code  2- meets goals/outcomes      Other Education:  -Group or individual verbal, written, or video instructions that support the educational goals of the cardiac rehab program.   CARDIAC REHAB PHASE II EXERCISE from 11/03/2017 in [place]  Date  09/24/17 [[facility]]  Educator  RD  Instruction Review Code  2- Demonstrated Understanding       Knowledge Questionnaire Score: Knowledge Questionnaire Score - 08/10/17 1413      Knowledge Questionnaire Score   Pre Score  23/24       Core Components/Risk Factors/Patient Goals at Admission: Personal Goals and Risk Factors at Admission - 08/10/17 1357      Core Components/Risk Factors/Patient Goals on Admission   Hypertension  Yes    Intervention  Provide education on lifestyle modifcations including regular physical activity/exercise, weight management, moderate sodium restriction and increased consumption of fresh fruit, vegetables, and low fat dairy, alcohol moderation, and smoking cessation.;Monitor prescription use compliance.    Lipids  Yes    Intervention  Provide education and support for participant on nutrition & aerobic/resistive exercise along with prescribed medications to achieve LDL 70mg , HDL >40mg .    Expected Outcomes  Short Term: Participant states understanding of desired cholesterol values and is compliant with medications prescribed. Participant is following exercise prescription and nutrition guidelines.;Long Term: Cholesterol controlled with medications as prescribed, with individualized exercise RX and with personalized nutrition plan. Value goals: LDL < 70mg , HDL > 40 mg.       Core Components/Risk Factors/Patient Goals Review:  Goals and Risk Factor Review    Row Name 08/18/17 1059 09/14/17 0817 10/12/17 1606 11/02/17 1522       Core Components/Risk Factors/Patient Goals Review   Personal Goals Review  Hypertension;Lipids  Hypertension;Lipids  Hypertension;Lipids  Hypertension;Lipids    Review  pt with CAD RF demonstrates eagerness to participate in CR activities. pt demonstrates adequate BP managment at CR activities.   pt with CAD RF demonstrates eagerness to participate in CR activities. pt demonstrates adequate BP managment at CR activities.   pt with CAD RF demonstrates eagerness to participate in CR activities. pt demonstrates adequate BP managment  at CR activities.   pt with CAD RF demonstrates eagerness to participate in CR activities. pt demonstrates adequate BP managment at CR activities.  Expected Outcomes  pt will participate in  CR exercise, nutrition and lifestyle modification activities to decrease overall RF.   pt will participate in  CR exercise, nutrition and lifestyle modification activities to decrease overall RF.   pt will participate in  CR exercise, nutrition and lifestyle modification activities to decrease overall RF.   pt will participate in  CR exercise, nutrition and lifestyle modification activities to decrease overall RF.        Core Components/Risk Factors/Patient Goals at Discharge (Final Review):  Goals and Risk Factor Review - 11/02/17 1522      Core Components/Risk Factors/Patient Goals Review   Personal Goals Review  Hypertension;Lipids    Review  pt with CAD RF demonstrates eagerness to participate in CR activities. pt demonstrates adequate BP managment at CR activities.     Expected Outcomes  pt will participate in  CR exercise, nutrition and lifestyle modification activities to decrease overall RF.        ITP Comments: ITP Comments    Row Name 08/10/17 1337 08/24/17 1559 09/14/17 5208 10/12/17 1605 11/02/17 1522   ITP Comments  Dr Fransico Him, Medical Director  30 day ITP review.  pt with good participation and attendance.  continue current regimen unless therwise directed by medical director.   30 day ITP review.  pt with good participation and attendance.  continue current regimen unless therwise directed by medical director.   30 day ITP review.  pt with good participation and attendance.  continue current regimen unless therwise directed by medical director.   30 day ITP review.  pt with good participation and attendance.     [place] Name 11/12/17 0740           ITP Comments  30 day ITP review.  pt with good participation and attendance.  pt currently on hold for orthopaedic injury.           Comments:

## 2017-11-15 ENCOUNTER — Encounter (HOSPITAL_COMMUNITY): Payer: Medicare Other

## 2017-11-17 ENCOUNTER — Encounter (HOSPITAL_COMMUNITY): Payer: Medicare Other

## 2017-11-22 ENCOUNTER — Encounter (HOSPITAL_COMMUNITY)
Admission: RE | Admit: 2017-11-22 | Discharge: 2017-11-22 | Disposition: A | Discharge status: Home / Self Care | Payer: Medicare Other | Source: Ambulatory Visit | Attending: Cardiovascular Disease | Admitting: Cardiovascular Disease

## 2017-11-22 DIAGNOSIS — I2510 Atherosclerotic heart disease of native coronary artery without angina pectoris: Secondary | ICD-10-CM | POA: Diagnosis not present

## 2017-11-22 DIAGNOSIS — Z79899 Other long term (current) drug therapy: Secondary | ICD-10-CM | POA: Insufficient documentation

## 2017-11-22 DIAGNOSIS — Z955 Presence of coronary angioplasty implant and graft: Secondary | ICD-10-CM | POA: Diagnosis present

## 2017-11-22 DIAGNOSIS — Z7982 Long term (current) use of aspirin: Secondary | ICD-10-CM | POA: Diagnosis not present

## 2017-11-22 DIAGNOSIS — Z7901 Long term (current) use of anticoagulants: Secondary | ICD-10-CM | POA: Insufficient documentation

## 2017-11-22 DIAGNOSIS — Z7902 Long term (current) use of antithrombotics/antiplatelets: Secondary | ICD-10-CM | POA: Diagnosis not present

## 2017-11-22 DIAGNOSIS — I440 Atrioventricular block, first degree: Secondary | ICD-10-CM | POA: Diagnosis not present

## 2017-11-22 DIAGNOSIS — E785 Hyperlipidemia, unspecified: Secondary | ICD-10-CM | POA: Insufficient documentation

## 2017-11-22 DIAGNOSIS — I10 Essential (primary) hypertension: Secondary | ICD-10-CM | POA: Insufficient documentation

## 2017-11-22 NOTE — Progress Notes (Signed)
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 Pt c/o continued undesired slow wt loss. Pt reports he did not think the wt gain information this writer gave him was appropriate at the time and now believes the information would be helpful. Pt's estimated nutriton needs for wt maintenance versus wt gain discussed. Heart healthy ways to increase caloric intake discussed. Pt encouraged to liberalize his diet to prevent further wt loss/promote wt gain. Pt expressed understanding of the information reviewed.   Nutrition Diagnosis ? Food-and nutrition-related knowledge deficit related to lack of exposure to information as related to diagnosis of: ? CVD  Nutrition Intervention ? Pt's individual nutrition plan reviewed with pt. ? Handout given for: Weight Gain/ High Calorie Meal-Plan for Athletes  Nutrition Goal(s):  ? Prevent further wt loss/promote wt gain  ? Pt to identify and limit food sources of saturated fat, trans fat, and sodium - met   Plan:  Pt to attend nutrition classes ? Portion Distortion - met 09/01/17 Will provide client-centered nutrition education as part of interdisciplinary care.   Monitor and evaluate progress toward nutrition goal with team.  Edna Franko, M.Ed, RD, LDN, CDE 11/22/2017 8:20 AM  

## 2017-11-22 NOTE — Progress Notes (Signed)
Daily Session Note  Patient Details  Name: Walter PAAR, MD MRN: [id number] Date of Birth: December 04, 1945 Referring Provider:     CARDIAC REHAB PHASE II ORIENTATION from 08/10/2017 in [place]  Referring Provider  [name], MD      Encounter Date: 11/22/2017  Check In: Session Check In - 11/22/17 0847      Check-In   Location  [facility]    Staff Present  Andi Hence, RN, BSN;Amber Fair, MS, ACSM RCEP, Exercise Physiologist;Tyara Carol Ada, MS,ACSM CEP, Exercise Physiologist;Molly diVincenzo, MS, ACSM RCEP, Exercise Physiologist    Supervising physician immediately available to respond to emergencies  [facility] immediately available    Physician(s)  Dr. Starla Link    Medication changes reported      No    Fall or balance concerns reported     No    Tobacco Cessation  No Change    Warm-up and Cool-down  Performed as group-led instruction    Resistance Training Performed  Yes    VAD Patient?  No      Pain Assessment   Currently in Pain?  Other (Comment)    Pain Score  0-No pain    Multiple Pain Sites  No       Capillary Blood Glucose: No results found for this or any previous visit (from the past 24 hour(s)).  Exercise Prescription Changes - 11/22/17 1600      Response to Exercise   Blood Pressure (Admit)  114/80    Blood Pressure (Exercise)  126/80    Blood Pressure (Exit)  112/82    Heart Rate (Admit)  84 bpm    Heart Rate (Exercise)  106 bpm    Heart Rate (Exit)  82 bpm    Rating of Perceived Exertion (Exercise)  11    Symptoms  none    Comments  Pt does weight station for 10 minutes.    Duration  Continue with 30 min of aerobic exercise without signs/symptoms of physical distress.    Intensity  THRR unchanged      Progression   Progression  Continue to progress workloads to maintain intensity without signs/symptoms of physical distress.    Average METs  3.8      Resistance Training   Training Prescription   Yes    Weight  6lbs    Reps  10-15    Time  10 Minutes      Treadmill   MPH  3.2    Grade  3    Minutes  10    METs  4.77      NuStep   Level  4    SPM  90    Minutes  10    METs  2.9      Home Exercise Plan   Plans to continue exercise at  [facility] (comment)    Frequency  Add 2 additional days to program exercise sessions.    Initial Home Exercises Provided  08/25/17       Social History   Tobacco Use  Smoking Status Never Smoker  Smokeless Tobacco Never Used    Goals Met:  Exercise tolerated well  Goals Unmet:  Not Applicable  Comments: pt returned to cardiac rehab today after extended absence for leg injury. Pt tolerated lighter activity without difficulty.    Dr. Fransico Him is Medical Director for Cardiac Rehab at [facility].

## 2017-11-24 ENCOUNTER — Encounter (HOSPITAL_COMMUNITY)
Admission: RE | Admit: 2017-11-24 | Discharge: 2017-11-24 | Disposition: A | Discharge status: Home / Self Care | Payer: Medicare Other | Source: Ambulatory Visit | Attending: Cardiovascular Disease | Admitting: Cardiovascular Disease

## 2017-11-24 DIAGNOSIS — Z955 Presence of coronary angioplasty implant and graft: Secondary | ICD-10-CM | POA: Diagnosis not present

## 2017-11-26 ENCOUNTER — Encounter (HOSPITAL_COMMUNITY)
Admission: RE | Admit: 2017-11-26 | Discharge: 2017-11-26 | Disposition: A | Discharge status: Home / Self Care | Payer: Medicare Other | Source: Ambulatory Visit | Attending: Cardiovascular Disease | Admitting: Cardiovascular Disease

## 2017-11-26 ENCOUNTER — Encounter (HOSPITAL_COMMUNITY): Payer: Self-pay

## 2017-11-26 VITALS — Wt 186.3 lb

## 2017-11-26 DIAGNOSIS — Z955 Presence of coronary angioplasty implant and graft: Secondary | ICD-10-CM

## 2017-12-07 NOTE — Progress Notes (Addendum)
Discharge Progress Report  Patient Details  Name: Walter ZANETTI, MD MRN: [id number] Date of Birth: [birth year] Referring Provider:     [facility] from 08/10/2017 in [place]  Referring Provider  [name], MD       Number of Visits: 36   Reason for Discharge:  Patient has met program and personal goals.  Smoking History:  Social History   Tobacco Use  Smoking Status Never Smoker  Smokeless Tobacco Never Used    Diagnosis:  S/P coronary artery stent placement 07/22/2017 DES LAD  ADL UCSD:   Initial Exercise Prescription: Initial Exercise Prescription - 08/10/17 1400      Date of Initial Exercise RX and Referring Provider   Date  08/10/17    Referring Provider  [name], MD      Treadmill   MPH  2.8    Grade  0    Minutes  10    METs  3.14      Bike   Level  1    Minutes  10    METs  3.16      NuStep   Level  4    SPM  90    Minutes  10    METs  3.1      Prescription Details   Frequency (times per week)  3    Duration  Progress to 30 minutes of continuous aerobic without signs/symptoms of physical distress      Intensity   THRR 40-80% of Max Heartrate  59-118    Ratings of Perceived Exertion  11-13    Perceived Dyspnea  0-4      Progression   Progression  Continue to progress workloads to maintain intensity without signs/symptoms of physical distress.      Resistance Training   Training Prescription  Yes    Weight  4lbs    Reps  10-15       Discharge Exercise Prescription (Final Exercise Prescription Changes): Exercise Prescription Changes - 11/26/17 1000      Response to Exercise   Blood Pressure (Admit)  122/80    Blood Pressure (Exercise)  128/70    Blood Pressure (Exit)  114/70    Heart Rate (Admit)  91 bpm    Heart Rate (Exercise)  100 bpm    Heart Rate (Exit)  89 bpm    Rating of Perceived Exertion (Exercise)  12    Symptoms  none    Comments  Pt does weight  station for 10 minutes.    Duration  Continue with 30 min of aerobic exercise without signs/symptoms of physical distress.    Intensity  THRR unchanged      Progression   Progression  Continue to progress workloads to maintain intensity without signs/symptoms of physical distress.    Average METs  3.8      Resistance Training   Training Prescription  Yes    Weight  5lbs    Reps  10-15    Time  10 Minutes      Treadmill   MPH  3.2    Grade  3    Minutes  10    METs  4.77      NuStep   Level  5    SPM  90    Minutes  10    METs  2.9      Home Exercise Plan   Plans to continue exercise at  [employer] (  comment)    Frequency  Add 2 additional days to program exercise sessions.    Initial Home Exercises Provided  08/25/17       Functional Capacity: 6 Minute Walk    Row Name 08/10/17 1417 11/05/17 0956       6 Minute Walk   Phase  Initial  Discharge    Distance  1434 feet  2062 feet    Distance % Change  -  43.79 %    Distance Feet Change  -  628 ft    Walk Time  6 minutes  6 minutes    # of Rest Breaks  0  0    MPH  2.72  3.99    METS  3.11  4.41    RPE  11  12    Perceived Dyspnea   -  0    VO2 Peak  10.9  15.44    Symptoms  No  No    Resting HR  69 bpm  71 bpm    Resting BP  128/84  110/70    Resting Oxygen Saturation   97 %  -    Max Ex. HR  98 bpm  111 bpm    Max Ex. BP  122/82  128/80    2 Minute Post BP  124/82  118/74       Psychological, QOL, Others - Outcomes: PHQ 2/9: Depression screen [facility] 2/9 11/26/2017 08/16/2017 07/04/2012  Decreased Interest 0 0 0  Down, Depressed, Hopeless 0 0 0  PHQ - 2 Score 0 0 0    Quality of Life: Quality of Life - 11/26/17 1045      Quality of Life Scores   Health/Function Post  -- pt declined completing post QOL assessment       Personal Goals: Goals established at orientation with interventions provided to work toward goal. Personal Goals and Risk Factors at Admission - 08/10/17 1357      Core  Components/Risk Factors/Patient Goals on Admission   Hypertension  Yes    Intervention  Provide education on lifestyle modifcations including regular physical activity/exercise, weight management, moderate sodium restriction and increased consumption of fresh fruit, vegetables, and low fat dairy, alcohol moderation, and smoking cessation.;Monitor prescription use compliance.    Lipids  Yes    Intervention  Provide education and support for participant on nutrition & aerobic/resistive exercise along with prescribed medications to achieve LDL <55m, HDL >435m    Expected Outcomes  Short Term: Participant states understanding of desired cholesterol values and is compliant with medications prescribed. Participant is following exercise prescription and nutrition guidelines.;Long Term: Cholesterol controlled with medications as prescribed, with individualized exercise RX and with personalized nutrition plan. Value goals: LDL < 7036mHDL > 40 mg.        Personal Goals Discharge: Goals and Risk Factor Review    Row Name 08/18/17 1059 09/14/17 0817 10/12/17 1606 11/02/17 1522 11/26/17 1159     Core Components/Risk Factors/Patient Goals Review   Personal Goals Review  Hypertension;Lipids  Hypertension;Lipids  Hypertension;Lipids  Hypertension;Lipids  Hypertension;Lipids   Review  pt with CAD RF demonstrates eagerness to participate in CR activities. pt demonstrates adequate BP managment at CR activities.   pt with CAD RF demonstrates eagerness to participate in CR activities. pt demonstrates adequate BP managment at CR activities.   pt with CAD RF demonstrates eagerness to participate in CR activities. pt demonstrates adequate BP managment at CR activities.   pt with CAD RF demonstrates eagerness  to participate in CR activities. pt demonstrates adequate BP managment at CR activities.   pt with CAD RF demonstrates eagerness to participate in CR activities. pt demonstrates adequate BP managment at CR activities.  pt pleased with his improved fitness and feels better.     Expected Outcomes  pt will participate in  CR exercise, nutrition and lifestyle modification activities to decrease overall RF.   pt will participate in  CR exercise, nutrition and lifestyle modification activities to decrease overall RF.   pt will participate in  CR exercise, nutrition and lifestyle modification activities to decrease overall RF.   pt will participate in  CR exercise, nutrition and lifestyle modification activities to decrease overall RF.   pt will continue exercise, nutrition and lifestyle modification activities to decrease overall RF.       Exercise Goals and Review: Exercise Goals    Row Name 08/10/17 1409             Exercise Goals   Increase Physical Activity  Yes       Intervention  Provide advice, education, support and counseling about physical activity/exercise needs.;Develop an individualized exercise prescription for aerobic and resistive training based on initial evaluation findings, risk stratification, comorbidities and participant's personal goals.       Expected Outcomes  Achievement of increased cardiorespiratory fitness and enhanced flexibility, muscular endurance and strength shown through measurements of functional capacity and personal statement of participant.       Increase Strength and Stamina  Yes       Intervention  Provide advice, education, support and counseling about physical activity/exercise needs.;Develop an individualized exercise prescription for aerobic and resistive training based on initial evaluation findings, risk stratification, comorbidities and participant's personal goals.       Expected Outcomes  Achievement of increased cardiorespiratory fitness and enhanced flexibility, muscular endurance and strength shown through measurements of functional capacity and personal statement of participant.       Able to understand and use rate of perceived exertion (RPE) scale  Yes        Intervention  Provide education and explanation on how to use RPE scale       Expected Outcomes  Long Term:  Able to use RPE to guide intensity level when exercising independently;Short Term: Able to use RPE daily in rehab to express subjective intensity level       Knowledge and understanding of Target Heart Rate Range (THRR)  Yes       Intervention  Provide education and explanation of THRR including how the numbers were predicted and where they are located for reference       Expected Outcomes  Short Term: Able to state/look up THRR;Short Term: Able to use daily as guideline for intensity in rehab;Long Term: Able to use THRR to govern intensity when exercising independently       Able to check pulse independently  Yes       Intervention  Provide education and demonstration on how to check pulse in carotid and radial arteries.;Review the importance of being able to check your own pulse for safety during independent exercise       Expected Outcomes  Short Term: Able to explain why pulse checking is important during independent exercise;Long Term: Able to check pulse independently and accurately       Understanding of Exercise Prescription  Yes       Intervention  Provide education, explanation, and written materials on patient's individual exercise prescription  Expected Outcomes  Short Term: Able to explain program exercise prescription;Long Term: Able to explain home exercise prescription to exercise independently          Nutrition & Weight - Outcomes: Pre Biometrics - 08/10/17 1512      Pre Biometrics   Height  6' 0.45" (1.84 m)    Weight  191 lb 12.8 oz (87 kg)    Hip Circumference  39.75 inches    BMI (Calculated)  25.7    Triceps Skinfold  11 mm    % Body Fat  24.2 %    Grip Strength  53 kg    Flexibility  14 in    Single Leg Stand  4.92 seconds      Post Biometrics - 12/07/17 1121       Post  Biometrics   Weight  186 lb 4.6 oz (84.5 kg)    Waist Circumference  37 inches     Hip Circumference  39 inches    Waist to Hip Ratio  0.95 %    Triceps Skinfold  11 mm    % Body Fat  23.6 %    Grip Strength  52 kg    Flexibility  16 in    Single Leg Stand  15.14 seconds       Nutrition: Nutrition Therapy & Goals - 11/22/17 0826      Nutrition Therapy   Diet  Therapeutic Lifestyle Changes      Personal Nutrition Goals   Nutrition Goal  Prevent further wt loss/promote wt gain.      Intervention Plan   Intervention  Prescribe, educate and counsel regarding individualized specific dietary modifications aiming towards targeted core components such as weight, hypertension, lipid management, diabetes, heart failure and other comorbidities.;Nutrition handout(s) given to patient. High Calorie, High Protein Nutrition for Athletes    Expected Outcomes  Short Term Goal: Understand basic principles of dietary content, such as calories, fat, sodium, cholesterol and nutrients.;Long Term Goal: Adherence to prescribed nutrition plan.       Nutrition Discharge: Nutrition Assessments - 08/13/17 1424      MEDFICTS Scores   Pre Score  9       Education Questionnaire Score: Knowledge Questionnaire Score - 11/26/17 0820      Knowledge Questionnaire Score   Post Score  24/24       Goals reviewed with patient; copy given to patient.

## 2017-12-28 ENCOUNTER — Telehealth: Payer: Self-pay | Admitting: Cardiovascular Disease

## 2017-12-28 NOTE — Telephone Encounter (Signed)
New message    Patient calling to verify  If can stop Plavix, due to stop March 5, but he is about 5 tablets short. Is there a need to get another refill? Please call

## 2017-12-28 NOTE — Telephone Encounter (Signed)
Patient is calling to see if he can discontinue Plavix.  He said Dr. Burt Knack told him he could stop 6 months out from his stent which is March 5. He has about 14 pills left and is requesting to stop a few days shy of March 5 instead of refilling. Per Dr. Antionette Char November 2018 note, "1.  Coronary artery disease, native vessel, without angina: The patient is tolerating his current medical therapy.  He will continue on a combination of clopidogrel and rivaroxaban.  I would like to see him back in about 6 months and we can make a decision on how to continue best with antiplatelet therapy in the setting of chronic oral anticoagulation.  He was encouraged to continue with cardiac rehab."  Reviewed Dr. Antionette Char notes with the patient. He states he'd like to stop because he is having occasional nose bleeds. "Nothing too bad, but I did have to pack it one time."  He understands he will be called with Dr. Antionette Char recommendations.

## 2017-12-30 ENCOUNTER — Ambulatory Visit (INDEPENDENT_AMBULATORY_CARE_PROVIDER_SITE_OTHER): Payer: Medicare Other | Admitting: *Deleted

## 2017-12-30 DIAGNOSIS — I442 Atrioventricular block, complete: Secondary | ICD-10-CM

## 2017-12-30 DIAGNOSIS — I428 Other cardiomyopathies: Secondary | ICD-10-CM

## 2017-12-30 MED ORDER — ASPIRIN EC 81 MG PO TBEC: 81.0000 mg | DELAYED_RELEASE_TABLET | Freq: Every day | ORAL | 3 refills | Status: DC

## 2017-12-30 NOTE — Telephone Encounter (Signed)
Per Dr. Burt Knack, instructed patient to finish Plavix then start ASA 81 mg daily along with his Xarelto. He was grateful for call and agrees with treatment plan.

## 2017-12-30 NOTE — Progress Notes (Signed)
Remote pacemaker transmission.   

## 2017-12-30 NOTE — Telephone Encounter (Signed)
This is ok. Considering the extent of his CAD, I think it would be best for him to start taking ASA 81 mg daily along with Xarelto once he stops Xarelto.

## 2018-01-05 ENCOUNTER — Encounter: Payer: Self-pay | Admitting: Cardiology

## 2018-01-13 ENCOUNTER — Encounter: Payer: Self-pay | Admitting: *Deleted

## 2018-01-16 LAB — CUP PACEART REMOTE DEVICE CHECK
Battery Voltage: 3 V
Brady Statistic AP VP Percent: 84.33 %
Brady Statistic AS VP Percent: 10.71 %
Brady Statistic AS VS Percent: 4.87 %
Implantable Lead Implant Date: 20130710
Implantable Lead Location: 753859
Implantable Lead Location: 753860
Implantable Pulse Generator Implant Date: 20170802
Lead Channel Impedance Value: 1026 Ohm
Lead Channel Impedance Value: 1064 Ohm
Lead Channel Impedance Value: 361 Ohm
Lead Channel Impedance Value: 456 Ohm
Lead Channel Impedance Value: 513 Ohm
Lead Channel Impedance Value: 589 Ohm
Lead Channel Impedance Value: 589 Ohm
Lead Channel Impedance Value: 589 Ohm
Lead Channel Pacing Threshold Amplitude: 1 V
Lead Channel Pacing Threshold Amplitude: 1 V
Lead Channel Pacing Threshold Pulse Width: 0.4 ms
Lead Channel Pacing Threshold Pulse Width: 0.4 ms
Lead Channel Sensing Intrinsic Amplitude: 1.5 mV
Lead Channel Sensing Intrinsic Amplitude: 1.5 mV
Lead Channel Sensing Intrinsic Amplitude: 15.625 mV
Lead Channel Setting Pacing Amplitude: 2 V
Lead Channel Setting Pacing Amplitude: 2.5 V
Lead Channel Setting Pacing Pulse Width: 0.4 ms
Lead Channel Setting Pacing Pulse Width: 0.8 ms
Lead Channel Setting Sensing Sensitivity: 4 mV
MDC IDC LEAD IMPLANT DT: 20130517
MDC IDC LEAD IMPLANT DT: 20170802
MDC IDC LEAD LOCATION: 753858
MDC IDC MSMT BATTERY REMAINING LONGEVITY: 103 mo
MDC IDC MSMT LEADCHNL LV IMPEDANCE VALUE: 1007 Ohm
MDC IDC MSMT LEADCHNL LV IMPEDANCE VALUE: 1083 Ohm
MDC IDC MSMT LEADCHNL LV IMPEDANCE VALUE: 494 Ohm
MDC IDC MSMT LEADCHNL LV IMPEDANCE VALUE: 969 Ohm
MDC IDC MSMT LEADCHNL LV IMPEDANCE VALUE: 969 Ohm
MDC IDC MSMT LEADCHNL RA IMPEDANCE VALUE: 361 Ohm
MDC IDC MSMT LEADCHNL RV PACING THRESHOLD AMPLITUDE: 0.75 V
MDC IDC MSMT LEADCHNL RV PACING THRESHOLD PULSEWIDTH: 0.4 ms
MDC IDC MSMT LEADCHNL RV SENSING INTR AMPL: 15.625 mV
MDC IDC SESS DTM: 20190214124218
MDC IDC SET LEADCHNL LV PACING AMPLITUDE: 1.5 V
MDC IDC STAT BRADY AP VS PERCENT: 0.09 %
MDC IDC STAT BRADY RA PERCENT PACED: 7.54 %
MDC IDC STAT BRADY RV PERCENT PACED: 98.9 %

## 2018-01-17 ENCOUNTER — Encounter: Payer: Self-pay | Admitting: Internal Medicine

## 2018-01-17 ENCOUNTER — Ambulatory Visit (INDEPENDENT_AMBULATORY_CARE_PROVIDER_SITE_OTHER): Payer: Medicare Other | Admitting: Internal Medicine

## 2018-01-17 VITALS — BP 118/80 | HR 76 | Ht 72.0 in | Wt 191.0 lb

## 2018-01-17 DIAGNOSIS — I428 Other cardiomyopathies: Secondary | ICD-10-CM | POA: Diagnosis not present

## 2018-01-17 DIAGNOSIS — I442 Atrioventricular block, complete: Secondary | ICD-10-CM | POA: Diagnosis not present

## 2018-01-17 DIAGNOSIS — Z950 Presence of cardiac pacemaker: Secondary | ICD-10-CM | POA: Diagnosis not present

## 2018-01-17 NOTE — Progress Notes (Signed)
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 He is s/p PVI X 2 at [facility]  he is status post pacemaker for complete heart block. He  is seen now status post CRT P upgrade   He has a history of coronary artery disease with prior stenting; per TDS  It was elected to use long term aspirin and a NOAC and Rivaroxaban was started   In the past angina has been triggered typically by emotional events.  He had recurrent symptoms last summer.  He underwent repeat catheterization with a 95% proximal LAD lesion noted.  Treated DES   No bleeding. He has recently establish care with the [place]. They have been drawing blood work   > DATE TEST    1/14    ECHO   EF 55-60 %   1/17    MYOVIEW   EF 47 % No  ischemia  5/17 Echo EF 40-45%   4/18 Echo EF 40-45%   9/18 Cath EF 60-65% LAD 95%>>DES; Lcx Stent Patent   With deteriorating LV function and modest changes in functional status, we undertook CRT upgrade.   Date Cr K Hgb  7/17 1.06  14.4  10/18  1.10 4.8 14.2    It was elected to keep him on chronic antiplatelet therapy in addition to his Rivaroxaban aspirin was selected---  No chest pain.  Bladder function is good.  No  edema.  Breathing mostly without a problem.  Nuisance nose bleeding but otherwise okay  Past Medical History:  Diagnosis Date  . Atrial flutter -atypical 2007   ABLATION x2 [facility]  . Atrial tachycardia ([place]) 2007   ABLATION [facility]  . Benign paroxysmal vertigo   . CAD (coronary artery disease)    a. abnl mv 01/2012;  b. cath 05/23/2012 LCX 80@ bifurcation w/ OM, 95% @ AV groove, otw nonobs dzs, EF 55-65%;  . Carcinoma ([place])    skin cancer "off my chest"  . Familial tremor   . First degree AV block    had 4 ablasions that left py with heart block  . History of blood transfusion 2010   "2nd day after prostatectomy"    . Hyperlipidemia   . Hypertension   . Incontinence    DECREASING INCONTINENCE RELATED TO PROSTATECTOMY.  . Migraines   . Presence of permanent cardiac pacemaker 2013 ?  Marland Kitchen Prostate cancer ([place])   . Pulsatile tinnitus   . Tachy-brady syndrome ([place])    a. developed after RFCA @ [facility] 2013-> MDT PPM;  b. s/p RA lead revision 05/2012    Past Surgical History:  Procedure Laterality Date  . ATRIAL ABLATION SURGERY  04/01/12   "4th time I had one"  . CARDIAC CATHETERIZATION    . CARDIOVERSION  12/03/2011   Procedure: CARDIOVERSION;  Surgeon: [name], MD;  Location: [facility];  Service: Cardiovascular;  Laterality: [name];  . CARDIOVERSION  01/13/2012   Procedure: CARDIOVERSION;  Surgeon: Lelon Perla, MD;  Location: [facility];  Service: Cardiovascular;  Laterality: [name];  . CARDIOVERSION  04/27/2012   Procedure: CARDIOVERSION;  Surgeon: Darlin Coco, MD;  Location: [facility];  Service: Cardiovascular;  Laterality: [name];  . COLONOSCOPY    . COLONOSCOPY WITH PROPOFOL [name] 10/29/2016   Procedure: COLONOSCOPY WITH PROPOFOL;  Surgeon: Nelida Meuse  III, MD;  Location: [facility];  Service: Gastroenterology;  Laterality: [name];  . CORONARY ANGIOPLASTY WITH STENT PLACEMENT  06/02/12   "1; first one ever"  . CORONARY STENT INTERVENTION [name] 07/21/2017   Procedure: CORONARY STENT INTERVENTION;  Surgeon: [name], MD;  Location: [place] [facility] [facility];  Service: Cardiovascular;  Laterality: [name];  . EP IMPLANTABLE DEVICE [name] 06/17/2016   Procedure: BiV Pacemaker upgrade;  Surgeon: [name], MD;  Location: [place] CV LAB;  Service: Cardiovascular;  Laterality: [name];  . HERNIA REPAIR  1980's  . INGUINAL HERNIA REPAIR  1945   left side  . INSERT / REPLACE / REMOVE PACEMAKER  03/2012   initial placement  . INSERT / REPLACE / REMOVE PACEMAKER  05/25/12   "atrial lead change"  . LEAD REVISION [name] 05/25/2012   Procedure: LEAD REVISION;  Surgeon: Evans Lance, MD;  Location: [facility] CATH LAB;  Service:  Cardiovascular;  Laterality: [name];  . LEFT HEART CATH AND CORONARY ANGIOGRAPHY [name] 07/21/2017   Procedure: LEFT HEART CATH AND CORONARY ANGIOGRAPHY;  Surgeon: [name], MD;  Location: [place] CV LAB;  Service: Cardiovascular;  Laterality: [name];  . PERCUTANEOUS CORONARY STENT INTERVENTION (PCI-S) [name] 06/02/2012   Procedure: PERCUTANEOUS CORONARY STENT INTERVENTION (PCI-S);  Surgeon: Hillary Bow, MD;  Location: [facility] CATH LAB;  Service: Cardiovascular;  Laterality: [name];  . PROSTATECTOMY  07/26/3715   COMPLICATED  . SHOULDER SURGERY     Bilateral; "boney spurs removed"  . SKIN CANCER EXCISION     "in situ; right chest"  . TONSILLECTOMY AND ADENOIDECTOMY     "as a child"    Current Outpatient Medications  Medication Sig Dispense Refill  . acyclovir (ZOVIRAX) 400 MG tablet Take 400 mg as directed by mouth. Herpes flare on legs    . aspirin EC 81 MG tablet Take 1 tablet (81 mg total) by mouth daily. 90 tablet 3  . cholecalciferol (VITAMIN D) 1000 UNITS tablet Take 1,000 Units by mouth daily.    Marland Kitchen ezetimibe (ZETIA) 10 MG tablet Take 5 mg by mouth daily.    . nitroGLYCERIN (NITROSTAT) 0.4 MG SL tablet Place 0.4 mg under the tongue every 5 (five) minutes as needed for chest pain (x 3 tabs).    . propranolol (INDERAL) 10 MG tablet Take 10 mg by mouth 3 (three) times daily as needed. Palpitations or benign central tremor     . vitamin B-12 (CYANOCOBALAMIN) 1000 MCG tablet Take 1,000 mcg by mouth daily.    Alveda Reasons 20 MG TABS tablet TAKE 1 TABLET(20 MG) BY MOUTH DAILY WITH DINNER 90 tablet 1  . losartan (COZAAR) 25 MG tablet Take 1 tablet (25 mg total) by mouth 2 (two) times daily. 60 tablet 6   No current facility-administered medications for this visit.     Allergies  Allergen Reactions  . Ceclor [Cefaclor] Hives  . Chlorhexidine Itching and Rash  . Statins Other (See Comments)    Muscle weakness Other reaction(s): Other (See Comments) Muscle weakness Muscle weakness   . Ace  Inhibitors Other (See Comments)    Does not take due to "angiotensin is responsible for maintaining bladder sphincter tone"    Review of Systems negative except from HPI and PMH  Physical Exam  BP 118/80   Pulse 76   Ht 6' (1.829 m)   Wt 191 lb (86.6 kg)   SpO2 97%   BMI 25.90 kg/m  Well developed and nourished in no acute distress HENT normal Neck supple  with JVP-flat Clear Regular rate and rhythm, no murmurs or gallops Abd-soft with active BS No Clubbing cyanosis edema Skin-warm and dry A & Oriented  Grossly normal sensory and motor function   ECG demonstrates P synchronous and AV pacing with a negative QRS in lead I and QR in lead V1  Assessment and  Plan  Atrial flutter/fib  Atrial fibrillation s/p ablation [facility] x2  Premature JUNCTIONAL BEATS/Atrial beats  Sinus node dysfunction  Cardiomyopathy-mild-resolved  Pacemaker CRT - Medtronic  The patient's device was interrogated.  The information was reviewed. No changes were made in the programming.    Coronary artery disease s/p  DES stenting 2013>>long term dual therapy recommended per Dr Lilly Cove    On Anticoagulation;  No bleeding issues   Blood pressure well controlled.  Without symptoms of ischemia  Afib is largely permanent. is serendipitously he is in sinus rhythm today.  Mostly asymptomatic We spent more than 50% of our >25 min visit in face to face counseling regarding the above

## 2018-01-17 NOTE — Patient Instructions (Addendum)
Medication Instructions:  Your physician recommends that you continue on your current medications as directed. Please refer to the Current Medication list given to you today.  Labwork: None  Testing/Procedures: None ordered.  Follow-Up: Your physician recommends that you schedule a follow-up appointment in: One Year with Dr Caryl Comes  Remote monitoring is used to monitor your Pacemaker of ICD from home. This monitoring reduces the number of office visits required to check your device to one time per year. It allows [place] to keep an eye on the functioning of your device to ensure it is working properly. You are scheduled for a device check from home on 5/16. You may send your transmission at any time that day. If you have a wireless device, the transmission will be sent automatically. After your physician reviews your transmission, you will receive a postcard with your next transmission date.   Any Other Special Instructions Will Be Listed Below (If Applicable).     If you need a refill on your cardiac medications before your next appointment, please call your pharmacy.

## 2018-01-20 LAB — CUP PACEART INCLINIC DEVICE CHECK
Battery Voltage: 2.99 V
Brady Statistic AP VS Percent: 0.23 %
Brady Statistic RA Percent Paced: 10.04 %
Brady Statistic RV Percent Paced: 98.66 %
Implantable Lead Implant Date: 20170802
Implantable Lead Location: 753859
Implantable Pulse Generator Implant Date: 20170802
Lead Channel Impedance Value: 1045 Ohm
Lead Channel Impedance Value: 1140 Ohm
Lead Channel Impedance Value: 361 Ohm
Lead Channel Impedance Value: 456 Ohm
Lead Channel Impedance Value: 950 Ohm
Lead Channel Impedance Value: 969 Ohm
Lead Channel Pacing Threshold Amplitude: 0.75 V
Lead Channel Pacing Threshold Amplitude: 1 V
Lead Channel Sensing Intrinsic Amplitude: 0.625 mV
Lead Channel Setting Pacing Amplitude: 2 V
Lead Channel Setting Pacing Amplitude: 2.5 V
Lead Channel Setting Pacing Pulse Width: 0.4 ms
Lead Channel Setting Pacing Pulse Width: 0.8 ms
Lead Channel Setting Sensing Sensitivity: 4 mV
MDC IDC LEAD IMPLANT DT: 20130517
MDC IDC LEAD IMPLANT DT: 20130710
MDC IDC LEAD LOCATION: 753858
MDC IDC LEAD LOCATION: 753860
MDC IDC MSMT BATTERY REMAINING LONGEVITY: 103 mo
MDC IDC MSMT LEADCHNL LV IMPEDANCE VALUE: 1026 Ohm
MDC IDC MSMT LEADCHNL LV IMPEDANCE VALUE: 1140 Ohm
MDC IDC MSMT LEADCHNL LV PACING THRESHOLD AMPLITUDE: 1 V
MDC IDC MSMT LEADCHNL LV PACING THRESHOLD PULSEWIDTH: 0.4 ms
MDC IDC MSMT LEADCHNL RA IMPEDANCE VALUE: 342 Ohm
MDC IDC MSMT LEADCHNL RA IMPEDANCE VALUE: 494 Ohm
MDC IDC MSMT LEADCHNL RA PACING THRESHOLD PULSEWIDTH: 0.4 ms
MDC IDC MSMT LEADCHNL RA SENSING INTR AMPL: 1.75 mV
MDC IDC MSMT LEADCHNL RV PACING THRESHOLD PULSEWIDTH: 0.4 ms
MDC IDC MSMT LEADCHNL RV SENSING INTR AMPL: 15.125 mV
MDC IDC MSMT LEADCHNL RV SENSING INTR AMPL: 15.125 mV
MDC IDC SESS DTM: 20190304140944
MDC IDC SET LEADCHNL LV PACING AMPLITUDE: 1.5 V
MDC IDC STAT BRADY AP VP PERCENT: 79.55 %
MDC IDC STAT BRADY AS VP PERCENT: 13.33 %
MDC IDC STAT BRADY AS VS PERCENT: 6.81 %

## 2018-03-28 ENCOUNTER — Encounter: Payer: Self-pay | Admitting: Cardiovascular Disease

## 2018-03-28 ENCOUNTER — Ambulatory Visit (INDEPENDENT_AMBULATORY_CARE_PROVIDER_SITE_OTHER): Payer: Medicare Other | Admitting: Cardiovascular Disease

## 2018-03-28 VITALS — BP 118/86 | HR 76 | Ht 72.0 in | Wt 186.1 lb

## 2018-03-28 DIAGNOSIS — I2510 Atherosclerotic heart disease of native coronary artery without angina pectoris: Secondary | ICD-10-CM | POA: Diagnosis not present

## 2018-03-28 DIAGNOSIS — E782 Mixed hyperlipidemia: Secondary | ICD-10-CM

## 2018-03-28 NOTE — Progress Notes (Signed)
Cardiology Office Note Date:  03/28/2018   ID:  Walter Mt, MD, DOB [birth year], MRN [id number]  PCP:  [name], MD  Cardiologist:  [name], MD    Chief Complaint  Patient presents with  . 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  I saw him last in November 2018.  His coronary history dates back to 2013 when he was treated with PCI of the left circumflex.  He returned in September 2018 with recurrent angina and was found to have a 95% proximal LAD stenosis with mild to moderate diffuse disease elsewhere.  His left circumflex stent was patent and there is chronic occlusion of the distal AV circumflex.  He was treated with PCI of the LAD using a drug-eluting stent.  Triple therapy for 1 month in the setting of atrial fibrillation.  At 30-month he changed antiplatelet therapy from clopidogrel back to aspirin 81 mg daily.  He is tolerating a combination of aspirin and rivaroxaban well at present.  He denies any recurrent symptoms of angina.  He is statin intolerant and has been tried on multiple agents at low dose.  Unfortunately he developed a drug rash with Zetia and he stopped this 5 weeks ago after seeing his dermatologist.  He is exploring the option of a PCSK9 inhibitor through the [place] [facility] system.  He denies shortness of breath, orthopnea, PND, or leg swelling.  He has had continued weight loss and feels that he is being too restrictive with his diet.     Past Medical History:  Diagnosis Date  . Atrial flutter -atypical 2007   ABLATION x2 [facility]  . Atrial tachycardia ([place]) 2007   ABLATION [facility]  . Benign paroxysmal vertigo   . CAD (coronary artery disease)    a. abnl mv 01/2012;  b. cath 05/23/2012 LCX 80@ bifurcation w/ OM, 95% @ AV groove, otw nonobs dzs, EF 55-65%;  . Carcinoma ([place])    skin cancer "off my chest"  . Familial tremor   . First degree AV block    had 4  ablasions that left py with heart block  . History of blood transfusion 2010   "2nd day after prostatectomy"  . Hyperlipidemia   . Hypertension   . Incontinence    DECREASING INCONTINENCE RELATED TO PROSTATECTOMY.  . Migraines   . Presence of permanent cardiac pacemaker 2013 ?  Marland Kitchen Prostate cancer ([place])   . Pulsatile tinnitus   . Tachy-brady syndrome ([place])    a. developed after RFCA @ [facility] 2013-> MDT PPM;  b. s/p RA lead revision 05/2012    Past Surgical History:  Procedure Laterality Date  . ATRIAL ABLATION SURGERY  04/01/12   "4th time I had one"  . CARDIAC CATHETERIZATION    . CARDIOVERSION  12/03/2011   Procedure: CARDIOVERSION;  Surgeon: [name], MD;  Location: [place];  Service: Cardiovascular;  Laterality: [name];  . CARDIOVERSION  01/13/2012   Procedure: CARDIOVERSION;  Surgeon: Lelon Perla, MD;  Location: [place];  Service: Cardiovascular;  Laterality: [name];  . CARDIOVERSION  04/27/2012   Procedure: CARDIOVERSION;  Surgeon: Darlin Coco, MD;  Location: [place];  Service: Cardiovascular;  Laterality: [name];  . COLONOSCOPY    . COLONOSCOPY WITH PROPOFOL [name] 10/29/2016   Procedure: COLONOSCOPY WITH PROPOFOL;  Surgeon: Doran Stabler, MD;  [facility];  Service: Gastroenterology;  Laterality: [name];  . CORONARY ANGIOPLASTY WITH STENT PLACEMENT  06/02/12   "1; first one ever"  . CORONARY STENT INTERVENTION [name] 07/21/2017   Procedure: CORONARY STENT INTERVENTION;  Surgeon: [name], MD;  Location: [place] [facility];  Service: Cardiovascular;  Laterality: [name];  . EP IMPLANTABLE DEVICE [name] 06/17/2016   Procedure: BiV Pacemaker upgrade;  Surgeon: [name], MD;  Location: [place] [facility];  Service: Cardiovascular;  Laterality: [name];  . HERNIA REPAIR  1980's  . INGUINAL HERNIA REPAIR  1945   left side  . INSERT / REPLACE / REMOVE PACEMAKER  03/2012   initial placement  . INSERT / REPLACE / REMOVE PACEMAKER  05/25/12   "atrial lead change"  . LEAD REVISION [name]  05/25/2012   Procedure: LEAD REVISION;  Surgeon: Evans Lance, MD;  Location: [facility] CATH [facility];  Service: Cardiovascular;  Laterality: [name];  . LEFT HEART CATH AND CORONARY ANGIOGRAPHY [name] 07/21/2017   Procedure: LEFT HEART CATH AND CORONARY ANGIOGRAPHY;  Surgeon: [name], MD;  Location: [place] [facility];  Service: Cardiovascular;  Laterality: [name];  . PERCUTANEOUS CORONARY STENT INTERVENTION (PCI-S) [name] 06/02/2012   Procedure: PERCUTANEOUS CORONARY STENT INTERVENTION (PCI-S);  Surgeon: Hillary Bow, MD;  Location: [facility] CATH LAB;  Service: Cardiovascular;  Laterality: [name];  . PROSTATECTOMY  02/15/7061   COMPLICATED  . SHOULDER SURGERY     Bilateral; "boney spurs removed"  . SKIN CANCER EXCISION     "in situ; right chest"  . TONSILLECTOMY AND ADENOIDECTOMY     "as a child"    Current Outpatient Medications  Medication Sig Dispense Refill  . acyclovir (ZOVIRAX) 400 MG tablet Take 400 mg as directed by mouth. Herpes flare on legs    . aspirin EC 81 MG tablet Take 1 tablet (81 mg total) by mouth daily. 90 tablet 3  . cholecalciferol (VITAMIN D) 1000 UNITS tablet Take 1,000 Units by mouth daily.    Marland Kitchen losartan (COZAAR) 25 MG tablet Take 1 tablet (25 mg total) by mouth 2 (two) times daily. 60 tablet 6  . nitroGLYCERIN (NITROSTAT) 0.4 MG SL tablet Place 0.4 mg under the tongue every 5 (five) minutes as needed for chest pain (x 3 tabs).    . propranolol (INDERAL) 10 MG tablet Take 10 mg by mouth 3 (three) times daily as needed. Palpitations or benign central tremor     . vitamin B-12 (CYANOCOBALAMIN) 1000 MCG tablet Take 1,000 mcg by mouth daily.    Alveda Reasons 20 MG TABS tablet TAKE 1 TABLET(20 MG) BY MOUTH DAILY WITH DINNER 90 tablet 1  . ezetimibe (ZETIA) 10 MG tablet Take 5 mg by mouth daily.     No current facility-administered medications for this visit.     Allergies:   Ceclor [cefaclor]; Chlorhexidine; Statins; and Ace inhibitors   Social History:  The patient  reports that he has  never smoked. He has never used smokeless tobacco. He reports that he drinks about 1.5 oz of alcohol per week. He reports that he does not use drugs.   Family History:  The patient's family history includes Colon cancer in his sister; Diabetes in his brother; Heart disease in his brother and father.    ROS:  Please see the history of present illness.  All other systems are reviewed and negative.    PHYSICAL EXAM: VS:  BP 118/86   Pulse 76   Ht 6' (1.829 m)   Wt 186 lb 1.9 oz (84.4  kg)   BMI 25.24 kg/m  , BMI Body mass index is 25.24 kg/m. GEN: Well nourished, well developed, in no acute distress  HEENT: normal  Neck: no JVD, no masses. No carotid bruits Cardiac: RRR without murmur or gallop      Respiratory:  clear to auscultation bilaterally, normal work of breathing GI: soft, nontender, nondistended, + BS MS: no deformity or atrophy  Ext: no pretibial edema, pedal pulses 2+= bilaterally Skin: warm and dry, no rash Neuro:  Strength and sensation are intact Psych: euthymic mood, full affect  EKG:  EKG is not ordered today.  Recent Labs: 08/13/2017: Hemoglobin 14.2; Platelets 204 08/23/2017: BUN 14; Creatinine, Ser 1.10; Potassium 4.8; Sodium 134   Lipid Panel     Component Value Date/Time   CHOL 191 09/13/2017 1358   TRIG 55 09/13/2017 1358   HDL 65 09/13/2017 1358   CHOLHDL 2.9 09/13/2017 1358   LDLCALC 115 (H) 09/13/2017 1358   LDLDIRECT 130 (H) 09/13/2017 1358      Wt Readings from Last 3 Encounters:  03/28/18 186 lb 1.9 oz (84.4 kg)  01/17/18 191 lb (86.6 kg)  12/07/17 186 lb 4.6 oz (84.5 kg)     Cardiac Studies Reviewed: Cardiac Cath 07/21/2017: Conclusion   1. Severe single-vessel coronary artery disease with critical stenosis of the proximal LAD, treated successfully with PCI using a 3.5 x 15 mm Xience Sierra drug-eluting stent 2. Moderate residual coronary artery disease involving the mid right coronary artery and mid LAD 3. Wide patency of the previously  stented site in the left circumflex with chronic occlusion of the distal AV circumflex collateralized by the OM branches 4. Normal LV systolic function (improved from previous)  Recommendations: The patient is chronically anticoagulated with Xarelto for atrial fibrillation. This can be restarted tomorrow. Recommend triple therapy with aspirin 81 mg, Plavix 75 mg, and Xarelto 20 mg for a period of one month. After that time I would recommend discontinuing aspirin and remaining on a combination of Plavix and Xarelto for total of 6 months from the time of this PCI procedure.     ASSESSMENT AND PLAN: 1.  CAD, native vessel, with atypical angina: He is had no symptoms reminiscent of his anginal pain last year.  He has occasional chest pains that are at rest.  He should continue on aspirin for antiplatelet therapy.  He does have trouble with medication intolerance and his blood pressure tends to run low, limiting his ability to take antianginal drug therapy.  2.  Hyperlipidemia: Reviewed past lab work and discussed dietary changes today.  It would be nice if he could liberalize his diet a little bit as he continues to lose weight and he is quite restrictive with his diet.  I think he would be an excellent candidate for a PCSK9 inhibitor and he continues to explore this through the [place] system.  He has now failed statin drugs and Zetia. I asked him to call if he can't get a PCSK9 through the [place] and we will try to arrange through our [facility].   3.  Atrial fibrillation: Reviewed data from his device and he is in atrial fibrillation/atrial tachycardia the vast majority of the time.  He is managed with rivaroxaban for anticoagulation.  He has undergone biventricular pacing.  He is followed closely by Dr. Caryl Comes.  Current medicines are reviewed with the patient today.  The patient does not have concerns regarding medicines.  Labs/ tests ordered today include:  No orders of the  defined types were placed in  this encounter.   Disposition:   FU 6 months  Signed, [name], MD  03/28/2018 1:51 PM    [facility] [address], [place], [place]  [address] Phone: [phone]; Fax: [phone]

## 2018-03-28 NOTE — Patient Instructions (Signed)

## 2018-03-31 ENCOUNTER — Ambulatory Visit (INDEPENDENT_AMBULATORY_CARE_PROVIDER_SITE_OTHER): Payer: Medicare Other | Admitting: *Deleted

## 2018-03-31 DIAGNOSIS — I428 Other cardiomyopathies: Secondary | ICD-10-CM | POA: Diagnosis not present

## 2018-03-31 DIAGNOSIS — I495 Sick sinus syndrome: Secondary | ICD-10-CM

## 2018-03-31 NOTE — Progress Notes (Signed)
Remote pacemaker transmission.   

## 2018-04-01 ENCOUNTER — Encounter: Payer: Self-pay | Admitting: Cardiology

## 2018-04-04 LAB — CUP PACEART REMOTE DEVICE CHECK
Brady Statistic AP VP Percent: 72.85 %
Brady Statistic AS VP Percent: 15.78 %
Brady Statistic RA Percent Paced: 43.63 %
Brady Statistic RV Percent Paced: 93.44 %
Implantable Lead Implant Date: 20130517
Implantable Lead Implant Date: 20130710
Implantable Lead Location: 753858
Implantable Lead Location: 753860
Implantable Pulse Generator Implant Date: 20170802
Lead Channel Impedance Value: 1007 Ohm
Lead Channel Impedance Value: 1026 Ohm
Lead Channel Impedance Value: 1045 Ohm
Lead Channel Impedance Value: 380 Ohm
Lead Channel Impedance Value: 513 Ohm
Lead Channel Impedance Value: 532 Ohm
Lead Channel Impedance Value: 950 Ohm
Lead Channel Pacing Threshold Amplitude: 0.75 V
Lead Channel Sensing Intrinsic Amplitude: 0.625 mV
Lead Channel Sensing Intrinsic Amplitude: 13.75 mV
Lead Channel Sensing Intrinsic Amplitude: 13.75 mV
Lead Channel Setting Pacing Amplitude: 2 V
Lead Channel Setting Pacing Amplitude: 2.5 V
Lead Channel Setting Pacing Pulse Width: 0.4 ms
Lead Channel Setting Pacing Pulse Width: 0.8 ms
Lead Channel Setting Sensing Sensitivity: 4 mV
MDC IDC LEAD IMPLANT DT: 20170802
MDC IDC LEAD LOCATION: 753859
MDC IDC MSMT BATTERY REMAINING LONGEVITY: 96 mo
MDC IDC MSMT BATTERY VOLTAGE: 2.99 V
MDC IDC MSMT LEADCHNL LV IMPEDANCE VALUE: 1007 Ohm
MDC IDC MSMT LEADCHNL LV IMPEDANCE VALUE: 494 Ohm
MDC IDC MSMT LEADCHNL LV IMPEDANCE VALUE: 589 Ohm
MDC IDC MSMT LEADCHNL LV IMPEDANCE VALUE: 608 Ohm
MDC IDC MSMT LEADCHNL LV IMPEDANCE VALUE: 969 Ohm
MDC IDC MSMT LEADCHNL LV PACING THRESHOLD AMPLITUDE: 1.375 V
MDC IDC MSMT LEADCHNL LV PACING THRESHOLD PULSEWIDTH: 0.8 ms
MDC IDC MSMT LEADCHNL RA IMPEDANCE VALUE: 361 Ohm
MDC IDC MSMT LEADCHNL RA PACING THRESHOLD AMPLITUDE: 1 V
MDC IDC MSMT LEADCHNL RA PACING THRESHOLD PULSEWIDTH: 0.4 ms
MDC IDC MSMT LEADCHNL RA SENSING INTR AMPL: 0.625 mV
MDC IDC MSMT LEADCHNL RV IMPEDANCE VALUE: 475 Ohm
MDC IDC MSMT LEADCHNL RV PACING THRESHOLD PULSEWIDTH: 0.4 ms
MDC IDC SESS DTM: 20190516110640
MDC IDC SET LEADCHNL LV PACING AMPLITUDE: 2 V
MDC IDC STAT BRADY AP VS PERCENT: 0.29 %
MDC IDC STAT BRADY AS VS PERCENT: 10.99 %

## 2018-04-06 ENCOUNTER — Ambulatory Visit (INDEPENDENT_AMBULATORY_CARE_PROVIDER_SITE_OTHER): Payer: Medicare Other | Admitting: *Deleted

## 2018-04-06 DIAGNOSIS — I442 Atrioventricular block, complete: Secondary | ICD-10-CM | POA: Diagnosis not present

## 2018-04-06 DIAGNOSIS — I495 Sick sinus syndrome: Secondary | ICD-10-CM | POA: Diagnosis not present

## 2018-04-06 DIAGNOSIS — I428 Other cardiomyopathies: Secondary | ICD-10-CM

## 2018-04-06 LAB — CUP PACEART INCLINIC DEVICE CHECK
Battery Remaining Longevity: 99 mo
Battery Voltage: 2.98 V
Brady Statistic AS VP Percent: 15.79 %
Brady Statistic RA Percent Paced: 41.97 %
Brady Statistic RV Percent Paced: 93.74 %
Implantable Lead Implant Date: 20130710
Implantable Lead Location: 753860
Implantable Pulse Generator Implant Date: 20170802
Lead Channel Impedance Value: 1083 Ohm
Lead Channel Impedance Value: 1083 Ohm
Lead Channel Impedance Value: 1083 Ohm
Lead Channel Impedance Value: 1121 Ohm
Lead Channel Impedance Value: 456 Ohm
Lead Channel Pacing Threshold Pulse Width: 0.4 ms
Lead Channel Pacing Threshold Pulse Width: 1 ms
Lead Channel Sensing Intrinsic Amplitude: 1.875 mV
Lead Channel Sensing Intrinsic Amplitude: 1.875 mV
Lead Channel Sensing Intrinsic Amplitude: 14 mV
Lead Channel Setting Pacing Amplitude: 2 V
Lead Channel Setting Pacing Amplitude: 2.5 V
Lead Channel Setting Pacing Pulse Width: 0.4 ms
Lead Channel Setting Sensing Sensitivity: 4 mV
MDC IDC LEAD IMPLANT DT: 20130517
MDC IDC LEAD IMPLANT DT: 20170802
MDC IDC LEAD LOCATION: 753858
MDC IDC LEAD LOCATION: 753859
MDC IDC MSMT LEADCHNL LV IMPEDANCE VALUE: 1064 Ohm
MDC IDC MSMT LEADCHNL LV IMPEDANCE VALUE: 1083 Ohm
MDC IDC MSMT LEADCHNL LV PACING THRESHOLD AMPLITUDE: 1 V
MDC IDC MSMT LEADCHNL RA IMPEDANCE VALUE: 361 Ohm
MDC IDC MSMT LEADCHNL RA IMPEDANCE VALUE: 513 Ohm
MDC IDC MSMT LEADCHNL RA PACING THRESHOLD AMPLITUDE: 1 V
MDC IDC MSMT LEADCHNL RV IMPEDANCE VALUE: 361 Ohm
MDC IDC MSMT LEADCHNL RV PACING THRESHOLD AMPLITUDE: 0.75 V
MDC IDC MSMT LEADCHNL RV PACING THRESHOLD PULSEWIDTH: 0.4 ms
MDC IDC MSMT LEADCHNL RV SENSING INTR AMPL: 14 mV
MDC IDC SESS DTM: 20190522151804
MDC IDC SET LEADCHNL LV PACING AMPLITUDE: 1.5 V
MDC IDC SET LEADCHNL LV PACING PULSEWIDTH: 1 ms
MDC IDC STAT BRADY AP VP PERCENT: 72.99 %
MDC IDC STAT BRADY AP VS PERCENT: 0.3 %
MDC IDC STAT BRADY AS VS PERCENT: 10.83 %

## 2018-04-06 NOTE — Progress Notes (Signed)
CRT-P device check in clinic d/t diaphragmatic stim. Normal device function. Thresholds, sensing, impedance consistent with previous measurements. Patient c/o intermittent d.stim - LV capture management turned off, LV output fixed at 1.5V@1 .57ms per SK (+0.5V safety margin), EffectivCRT during AF d/c'd d/t lockout (ok per SK) - Vp30. Histograms appropriate for patient and level of activity. Stable thoracic impedance. 49%AT/AF burden, presently in AFL + xarelto. No ventricular high rate episodes. Patient bi-ventricularly pacing 93.7% of the time. Device programmed with appropriate safety margins. Device heart failure diagnostics are within normal limits and stable over time. Estimated longevity now 8 years. Patient will follow up as scheduled.

## 2018-06-20 ENCOUNTER — Telehealth: Payer: Self-pay | Admitting: Internal Medicine

## 2018-06-20 NOTE — Telephone Encounter (Signed)
Pt would like to know if he can come off his losartan for a few weeks to see if this helps with his rash. He would need a substitute during that time. I have told pt Dr Caryl Comes is out of the office until next week. I offered to contact the pharmacy to recommendation.  Pt stated he would wait to speak to Dr Caryl Comes at that time.

## 2018-06-20 NOTE — Telephone Encounter (Signed)
New Message:     Please call, he wants to discuss changing his blood pressure medicine for 2 to 3 weeks to check for an allergy.

## 2018-06-21 ENCOUNTER — Ambulatory Visit (INDEPENDENT_AMBULATORY_CARE_PROVIDER_SITE_OTHER): Payer: Medicare Other | Admitting: Orthopedic Surgery

## 2018-06-21 ENCOUNTER — Ambulatory Visit (INDEPENDENT_AMBULATORY_CARE_PROVIDER_SITE_OTHER): Payer: Self-pay

## 2018-06-21 ENCOUNTER — Encounter (INDEPENDENT_AMBULATORY_CARE_PROVIDER_SITE_OTHER): Payer: Self-pay | Admitting: Orthopedic Surgery

## 2018-06-21 VITALS — Ht 72.0 in | Wt 186.0 lb

## 2018-06-21 DIAGNOSIS — L600 Ingrowing nail: Secondary | ICD-10-CM | POA: Diagnosis not present

## 2018-06-21 DIAGNOSIS — M25572 Pain in left ankle and joints of left foot: Secondary | ICD-10-CM

## 2018-06-21 DIAGNOSIS — I2510 Atherosclerotic heart disease of native coronary artery without angina pectoris: Secondary | ICD-10-CM | POA: Diagnosis not present

## 2018-06-30 ENCOUNTER — Ambulatory Visit (INDEPENDENT_AMBULATORY_CARE_PROVIDER_SITE_OTHER): Payer: Medicare Other | Admitting: *Deleted

## 2018-06-30 DIAGNOSIS — I428 Other cardiomyopathies: Secondary | ICD-10-CM

## 2018-06-30 DIAGNOSIS — I495 Sick sinus syndrome: Secondary | ICD-10-CM | POA: Diagnosis not present

## 2018-06-30 NOTE — Progress Notes (Signed)
Remote pacemaker transmission.   

## 2018-07-05 ENCOUNTER — Encounter (INDEPENDENT_AMBULATORY_CARE_PROVIDER_SITE_OTHER): Payer: Self-pay | Admitting: Orthopedic Surgery

## 2018-07-05 NOTE — Progress Notes (Signed)
Office Visit Note   [name], [name]           Date of Birth: 1946/05/25           MRN: [id number] Visit Date: 06/21/2018              Requested by: [name], [name] [address] [place], [place] [address] PCP: [name], [name]  Chief Complaint  Patient presents with  . Left Foot - Pain    Ingrown toe nail      HPI: Patient is a 72 year old gentleman who presents with a painful ingrown left great toenail with concerns for possible infection.  Patient states he has had episodes of inflammation and redness.  Assessment & Plan: Visit Diagnoses:  1. Pain in left ankle and joints of left foot   2. Ingrown nail of great toe of left foot     Plan: The border of the toenail was excised without complications a sterile dressing was applied.  Patient will start antibiotic ointment dressing changes tomorrow.  Follow-Up Instructions: Return if symptoms worsen or fail to improve.   Ortho Exam  Patient is alert, oriented, no adenopathy, well-dressed, normal affect, normal respiratory effort. Examination patient has good pulses.  There is no cellulitis no signs of paronychial infection.  Patient does have a painful ingrown toenail.  After informed consent the toe was sterilely prepped with Betadine patient underwent a digital block with 10 cc of 1% lidocaine plain after adequate levels of anesthesia were obtained the border of the nail was removed without complications a sterile dressing was applied patient was placed in a postoperative shoe.  Imaging: Xr Ankle 2 Views Left  Result Date: 07/05/2018 Radiographs were not obtained  No images are attached to the encounter.  Labs: Lab Results  Component Value Date   REPTSTATUS 02/25/2009 FINAL 02/25/2009     Lab Results  Component Value Date   ALBUMIN 4.3 08/22/2012   ALBUMIN 4.4 02/12/2009    Body mass index is 25.23 kg/m.  Orders:  Orders Placed This Encounter  Procedures  . XR Ankle 2 Views Left     No orders of the defined types were placed in this encounter.    Procedures: No procedures performed  Clinical Data: No additional findings.  ROS:  All other systems negative, except as noted in the HPI. Review of Systems  Objective: Vital Signs: Ht 6' (1.829 m)   Wt 186 lb (84.4 kg)   BMI 25.23 kg/m   Specialty Comments:  No specialty comments available.  PMFS History: Patient Active Problem List   Diagnosis Date Noted  . Family history of colon cancer   . Rectal polyp   . Cardiomyopathy, nonischemic ([place]) 06/17/2016  . Malignant tumor of prostate ([place]) 05/11/2016  . Dyspnea on exertion 11/25/2012  . Hyponatremia 09/06/2012  . CAD (coronary artery disease) 05/26/2012  . Pacemaker-Medtronic 04/15/2012  . Benign positional vertigo 03/31/2012  . First degree AV block 03/31/2012  . Persistent atrial fibrillation ([place]) 03/31/2012  . Sinus node dysfunction ([place]) 03/31/2012  . Complete heart block ([place])   . 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 Stress incontinence 11/02/2011  . Hyperlipidemia   . Pulsatile tinnitus   . Atrial flutter/fibrillation   . Familial tremor   . Atrial tachycardia ([place])   . Hypertension    Past Medical History:  Diagnosis Date  . Atrial flutter -atypical 2007   ABLATION x2 [facility]  . Atrial tachycardia ([place])  2007   ABLATION [facility]  . Benign paroxysmal vertigo   . CAD (coronary artery disease)    a. abnl mv 01/2012;  b. cath 05/23/2012 LCX 80@ bifurcation w/ OM, 95% @ AV groove, otw nonobs dzs, EF 55-65%;  . Carcinoma ([place])    skin cancer "off my chest"  . Familial tremor   . First degree AV block    had 4 ablasions that left py with heart block  . History of blood transfusion 2010   "2nd day after prostatectomy"  . Hyperlipidemia   . Hypertension   . Incontinence    DECREASING INCONTINENCE RELATED TO PROSTATECTOMY.  . Migraines   . Presence of permanent cardiac pacemaker 2013 ?  Marland Kitchen Prostate cancer ([place])   .  Pulsatile tinnitus   . 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 developed after RFCA @ [facility] 2013-> MDT PPM;  b. s/p RA lead revision 05/2012    Family History  Problem Relation Age of Onset  . Colon cancer Sister   . Heart disease Father   . Heart disease Brother        only one brother  . Diabetes Brother        only one brother    Past Surgical History:  Procedure Laterality Date  . ATRIAL ABLATION SURGERY  04/01/12   "4th time I had one"  . CARDIAC CATHETERIZATION    . CARDIOVERSION  12/03/2011   Procedure: CARDIOVERSION;  Surgeon: [name], [name];  Location: [place];  Service: Cardiovascular;  Laterality: [name];  . CARDIOVERSION  01/13/2012   Procedure: CARDIOVERSION;  Surgeon: Lelon Perla, [name];  Location: [place];  Service: Cardiovascular;  Laterality: [name];  . CARDIOVERSION  04/27/2012   Procedure: CARDIOVERSION;  Surgeon: Darlin Coco, [name];  Location: [place];  Service: Cardiovascular;  Laterality: [name];  . COLONOSCOPY    . COLONOSCOPY WITH PROPOFOL [name] 10/29/2016   Procedure: COLONOSCOPY WITH PROPOFOL;  Surgeon: Doran Stabler, [name];  [facility];  Service: Gastroenterology;  Laterality: [name];  . CORONARY ANGIOPLASTY WITH STENT PLACEMENT  06/02/12   "1; first one ever"  . CORONARY STENT INTERVENTION [name] 07/21/2017   Procedure: CORONARY STENT INTERVENTION;  Surgeon: [name], [name];  Location: [place] [facility];  Service: Cardiovascular;  Laterality: [name];  . EP IMPLANTABLE DEVICE [name] 06/17/2016   Procedure: BiV Pacemaker upgrade;  Surgeon: [name], [name];  Location: [place] [facility];  Service: Cardiovascular;  Laterality: [name];  . HERNIA REPAIR  1980's  . INGUINAL HERNIA REPAIR  1945   left side  . INSERT / REPLACE / REMOVE PACEMAKER  03/2012   initial placement  . INSERT / REPLACE / REMOVE PACEMAKER  05/25/12   "atrial lead change"  . LEAD REVISION [name] 05/25/2012   Procedure: LEAD REVISION;  Surgeon: Evans Lance, [name];  Location: [facility] CATH LAB;  Service:  Cardiovascular;  Laterality: [name];  . LEFT HEART CATH AND CORONARY ANGIOGRAPHY [name] 07/21/2017   Procedure: LEFT HEART CATH AND CORONARY ANGIOGRAPHY;  Surgeon: [name], [name];  Location: [place] [facility];  Service: Cardiovascular;  Laterality: [name];  . PERCUTANEOUS CORONARY STENT INTERVENTION (PCI-S) [name] 06/02/2012   Procedure: PERCUTANEOUS CORONARY STENT INTERVENTION (PCI-S);  Surgeon: Hillary Bow, [name];  [facility] C[facility];  Service: Cardiovascular;  Laterality: [name];  . PROSTATECTOMY  03/24/5637   COMPLICATED  . SHOULDER SURGERY     Bilateral; "boney spurs removed"  . SKIN CANCER EXCISION     "in  situ; right chest"  . TONSILLECTOMY AND ADENOIDECTOMY     "as a child"   Social History   Occupational History  . Occupation: Physician    Comment: Retired - ENT  Tobacco Use  . Smoking status: Never Smoker  . Smokeless tobacco: Never Used  Substance and Sexual Activity  . Alcohol use: Yes    Alcohol/week: 3.0 standard drinks    Types: 3 Standard drinks or equivalent per week    Comment: drinks alcohol at least once every 3 months 05/25/12 "last alcohol 02/2012"  . Drug use: No  . Sexual activity: Not Currently

## 2018-07-25 ENCOUNTER — Telehealth: Payer: Self-pay | Admitting: Internal Medicine

## 2018-07-25 NOTE — Telephone Encounter (Signed)
° ° °  Patient hs questions regarding rash versus Losartan. Requesting call from nurse

## 2018-07-25 NOTE — Telephone Encounter (Signed)
Pt is requesting a substitute for his Losartan for a few weeks for a trial. Pt has a rash and would like to see if it is the losartan causing the rash. Pt has requested the pharmacist take a look at his med hx as he has not tolerated may BP meds in the past.

## 2018-07-25 NOTE — Telephone Encounter (Signed)
Looks as though [name] has been taking losartan since 2017. Unlikely that this is causing his rash. I do not see any other blood pressure medications in the past that we have prescribed in Epic. He has ACEi listed under his allergies: Does not take due to "angiotensin is responsible for maintaining bladder sphincter tone." Ideally would prefer ACEi or ARB due to history of reduced LVEF 35-40% (most recently 40-45% in 02/2017). If [name] is willing to try a different ARB, equivalent dose would be valsartan total of 160mg  daily. Not sure if Dr Caryl Comes was dosing losartan BID to help try to decrease orthostasis; could start valsartan 80mg  BID if that is the case. Otherwise spironolactone 12.5mg  daily would be beneficial with history of reduced LVEF. He would need f/u BMET in 7 days to monitor K.

## 2018-07-26 MED ORDER — VALSARTAN 80 MG PO TABS: 80.0000 mg | ORAL_TABLET | Freq: Two times a day (BID) | ORAL | 11 refills | Status: DC

## 2018-07-26 NOTE — Telephone Encounter (Signed)
Per Dr Caryl Comes, pt is to begin Valsartan, 80mg , bid. He will take this medication to see if his rash subsides. If it does not, he may move on to spironolactone 12.5mg  per pharmacy's recommendation. Pt has agreed with plan and verbalized understanding.

## 2018-08-08 LAB — CUP PACEART REMOTE DEVICE CHECK
Battery Voltage: 2.98 V
Brady Statistic AP VP Percent: 74.69 %
Brady Statistic AS VP Percent: 16.73 %
Brady Statistic RV Percent Paced: 93.65 %
Implantable Lead Implant Date: 20130517
Implantable Lead Implant Date: 20170802
Implantable Lead Location: 753858
Implantable Lead Location: 753860
Lead Channel Impedance Value: 1026 Ohm
Lead Channel Impedance Value: 1064 Ohm
Lead Channel Impedance Value: 1064 Ohm
Lead Channel Impedance Value: 342 Ohm
Lead Channel Impedance Value: 456 Ohm
Lead Channel Impedance Value: 570 Ohm
Lead Channel Pacing Threshold Amplitude: 1.375 V
Lead Channel Pacing Threshold Pulse Width: 0.4 ms
Lead Channel Sensing Intrinsic Amplitude: 0.5 mV
Lead Channel Sensing Intrinsic Amplitude: 12.25 mV
Lead Channel Sensing Intrinsic Amplitude: 12.25 mV
Lead Channel Setting Pacing Amplitude: 1.5 V
Lead Channel Setting Pacing Amplitude: 2.5 V
MDC IDC LEAD IMPLANT DT: 20130710
MDC IDC LEAD LOCATION: 753859
MDC IDC MSMT BATTERY REMAINING LONGEVITY: 95 mo
MDC IDC MSMT LEADCHNL LV IMPEDANCE VALUE: 1026 Ohm
MDC IDC MSMT LEADCHNL LV IMPEDANCE VALUE: 1026 Ohm
MDC IDC MSMT LEADCHNL LV IMPEDANCE VALUE: 1102 Ohm
MDC IDC MSMT LEADCHNL LV IMPEDANCE VALUE: 532 Ohm
MDC IDC MSMT LEADCHNL LV IMPEDANCE VALUE: 589 Ohm
MDC IDC MSMT LEADCHNL LV IMPEDANCE VALUE: 608 Ohm
MDC IDC MSMT LEADCHNL LV PACING THRESHOLD PULSEWIDTH: 0.8 ms
MDC IDC MSMT LEADCHNL RA IMPEDANCE VALUE: 494 Ohm
MDC IDC MSMT LEADCHNL RA PACING THRESHOLD AMPLITUDE: 1 V
MDC IDC MSMT LEADCHNL RA PACING THRESHOLD PULSEWIDTH: 0.4 ms
MDC IDC MSMT LEADCHNL RA SENSING INTR AMPL: 0.5 mV
MDC IDC MSMT LEADCHNL RV IMPEDANCE VALUE: 361 Ohm
MDC IDC MSMT LEADCHNL RV PACING THRESHOLD AMPLITUDE: 0.75 V
MDC IDC PG IMPLANT DT: 20170802
MDC IDC SESS DTM: 20190815120056
MDC IDC SET LEADCHNL LV PACING PULSEWIDTH: 1 ms
MDC IDC SET LEADCHNL RA PACING AMPLITUDE: 2 V
MDC IDC SET LEADCHNL RV PACING PULSEWIDTH: 0.4 ms
MDC IDC SET LEADCHNL RV SENSING SENSITIVITY: 4 mV
MDC IDC STAT BRADY AP VS PERCENT: 0.26 %
MDC IDC STAT BRADY AS VS PERCENT: 8.26 %
MDC IDC STAT BRADY RA PERCENT PACED: 57.49 %

## 2018-08-26 ENCOUNTER — Telehealth: Payer: Self-pay | Admitting: Internal Medicine

## 2018-08-26 DIAGNOSIS — I10 Essential (primary) hypertension: Secondary | ICD-10-CM

## 2018-08-26 NOTE — Telephone Encounter (Signed)
Spoke with pt today who states he had no difference in his rash after changing from Losartan. 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 Pt will wait to speak with Dr Caryl Comes regarding switching medications. He would prefer to stay off Spironolactone because of his higher potassium levels.

## 2018-08-26 NOTE — Telephone Encounter (Signed)
New Message        Patient is calling today because of a drug rash. He would like to talk to some one about switching to another drug.

## 2018-08-29 NOTE — Telephone Encounter (Signed)
Dr Klein to call pt 

## 2018-08-30 MED ORDER — SPIRONOLACTONE-HCTZ 25-25 MG PO TABS: 0.5000 | ORAL_TABLET | Freq: Every day | ORAL | 3 refills | Status: DC

## 2018-08-30 MED ORDER — SPIRONOLACTONE-HCTZ 25-25 MG PO TABS: 0.5000 | ORAL_TABLET | Freq: Every day | ORAL | 4 refills | Status: DC

## 2018-08-30 NOTE — Telephone Encounter (Signed)
Per Dr Caryl Comes, pt is to begin Aldatazide (Spironolactone/HCTZ) 1/2 tablet (12.5mg /12.5mg ) qd. Pt will have repeat BMP drawn in office on Monday 10/28.

## 2018-09-12 ENCOUNTER — Other Ambulatory Visit: Payer: Medicare Other | Admitting: *Deleted

## 2018-09-12 LAB — BASIC METABOLIC PANEL
BUN/Creatinine Ratio: 19 (ref 10–24)
BUN: 22 mg/dL (ref 8–27)
CALCIUM: 9.3 mg/dL (ref 8.6–10.2)
CHLORIDE: 90 mmol/L — AB (ref 96–106)
CO2: 24 mmol/L (ref 20–29)
Creatinine, Ser: 1.18 mg/dL (ref 0.76–1.27)
GFR calc Af Amer: 70 mL/min/{1.73_m2} (ref >59)
GFR calc non Af Amer: 60 mL/min/{1.73_m2} (ref >59)
GLUCOSE: 95 mg/dL (ref 65–99)
POTASSIUM: 4.5 mmol/L (ref 3.5–5.2)
Sodium: 130 mmol/L — ABNORMAL LOW (ref 134–144)

## 2018-09-14 ENCOUNTER — Telehealth: Payer: Self-pay | Admitting: Internal Medicine

## 2018-09-14 NOTE — Telephone Encounter (Signed)
Left voicemail for the patient to call the office to discuss. 

## 2018-09-14 NOTE — Telephone Encounter (Signed)
New message:      Pt c/o medication issue:  1. Name of Medication: spironolactone-hydrochlorothiazide (ALDACTAZIDE) 25-25 MG tablet  2. How are you currently taking this medication (dosage and times per day)?Take 0.5 tablets by mouth daily.   3. Are you having a reaction (difficulty breathing--STAT)? No  4. What is your medication issue? Pt is calling in reference to some labs he had to see if this medication is working properly and he said his labs weren't good.

## 2018-09-14 NOTE — Telephone Encounter (Signed)
Advised pt, per Dr. Caryl Comes, to stop Aldactazide.   Instructed that if HA resolves after stopping, to call our office next week and we will start Aldactone 25 mg daily (w/o the HCTZ). Pt agreeable to plan.

## 2018-09-14 NOTE — Telephone Encounter (Signed)
Patient is concerned regarding his sodium being 130, taking aldactazide 25-25. Patient states that he is currently having low grade headaches. Per the patient he has history of hyponatremia and headaches is his first typical symptom. Please advise.

## 2018-09-29 ENCOUNTER — Other Ambulatory Visit: Payer: Self-pay | Admitting: Surgery

## 2018-09-29 ENCOUNTER — Ambulatory Visit (INDEPENDENT_AMBULATORY_CARE_PROVIDER_SITE_OTHER): Payer: Medicare Other | Admitting: *Deleted

## 2018-09-29 ENCOUNTER — Encounter: Payer: Self-pay | Admitting: Internal Medicine

## 2018-09-29 ENCOUNTER — Ambulatory Visit (INDEPENDENT_AMBULATORY_CARE_PROVIDER_SITE_OTHER): Payer: Medicare Other | Admitting: Internal Medicine

## 2018-09-29 VITALS — BP 126/88 | HR 74 | Ht 72.0 in | Wt 186.8 lb

## 2018-09-29 DIAGNOSIS — I428 Other cardiomyopathies: Secondary | ICD-10-CM

## 2018-09-29 DIAGNOSIS — R1012 Left upper quadrant pain: Secondary | ICD-10-CM

## 2018-09-29 DIAGNOSIS — I4892 Unspecified atrial flutter: Secondary | ICD-10-CM | POA: Diagnosis not present

## 2018-09-29 DIAGNOSIS — I2510 Atherosclerotic heart disease of native coronary artery without angina pectoris: Secondary | ICD-10-CM | POA: Diagnosis not present

## 2018-09-29 DIAGNOSIS — I442 Atrioventricular block, complete: Secondary | ICD-10-CM | POA: Diagnosis not present

## 2018-09-29 DIAGNOSIS — I495 Sick sinus syndrome: Secondary | ICD-10-CM

## 2018-09-29 DIAGNOSIS — I480 Paroxysmal atrial fibrillation: Secondary | ICD-10-CM

## 2018-09-29 DIAGNOSIS — I492 Junctional premature depolarization: Secondary | ICD-10-CM | POA: Diagnosis not present

## 2018-09-29 DIAGNOSIS — Z950 Presence of cardiac pacemaker: Secondary | ICD-10-CM

## 2018-09-29 NOTE — Patient Instructions (Signed)
Medication Instructions:  Your physician recommends that you continue on your current medications as directed. Please refer to the Current Medication list given to you today.  Labwork: None ordered.  Testing/Procedures: None ordered.  Follow-Up: Your physician recommends that you schedule a follow-up appointment in:   6 months with Dr Caryl Comes  Remote monitoring is used to monitor your Pacemaker from home. This monitoring reduces the number of office visits required to check your device to one time per year. It allows [place] to keep an eye on the functioning of your device to ensure it is working properly. You are scheduled for a device check from home on 2/13. You may send your transmission at any time that day. If you have a wireless device, the transmission will be sent automatically. After your physician reviews your transmission, you will receive a postcard with your next transmission date.    Any Other Special Instructions Will Be Listed Below (If Applicable).     If you need a refill on your cardiac medications before your next appointment, please call your pharmacy.

## 2018-09-29 NOTE — Progress Notes (Signed)
Remote pacemaker transmission.   

## 2018-09-29 NOTE — Progress Notes (Signed)
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 He is s/p PVI X 2 at [facility]  he is status post pacemaker for complete heart block. He  is seen now status post CRT P upgrade   He has a history of coronary artery disease with prior stenting; per TDS  It was elected to use long term aspirin and a NOAC and Rivaroxaban was started; no interval bleeding  In the past angina has been triggered typically by emotional events.  He had recurrent symptoms last summer.  He underwent repeat catheterization with a 95% proximal LAD lesion noted.  Treated DES  Changed BP meds ? 2/2 drug reaction to losartan Has had hx of hyponatremia in past with thiazides and concerns of hypokalemia Rx aldactazide>> his Na>>130 w low grade HA, was going to change to aldactone but blood pressures have remained in the 110-130 range.  Has had intermittent edema but now back to baseline.  Exercising without angina.  Functional status is stable.  Rash is stabilized.    > DATE TEST    1/14    ECHO   EF 55-60 %   1/17    MYOVIEW   EF 47 % No  ischemia  5/17 Echo EF 40-45%   4/18 Echo EF 40-45%   9/18 Cath EF 60-65% LAD 95%>>DES; Lcx Stent Patent   With deteriorating LV function and modest changes in functional status, we undertook CRT upgrade.   Date Cr K Na Hgb  7/17 1.06   14.4  10/18  1.10 4.8 134 14.2   10/19 1.18 4.5 130    It was elected to keep him on chronic antiplatelet therapy in addition to his Rivaroxaban aspirin was selected---    Past Medical History:  Diagnosis Date  . Atrial flutter -atypical 2007   ABLATION x2 [facility]  . Atrial tachycardia ([place]) 2007   ABLATION [facility]  . Benign paroxysmal vertigo   . CAD (coronary artery disease)    a. abnl mv 01/2012;  b. cath 05/23/2012 LCX 80@ bifurcation w/ OM, 95% @ AV groove, otw nonobs dzs, EF  55-65%;  . Carcinoma ([place])    skin cancer "off my chest"  . Familial tremor   . First degree AV block    had 4 ablasions that left py with heart block  . History of blood transfusion 2010   "2nd day after prostatectomy"  . Hyperlipidemia   . Hypertension   . Incontinence    DECREASING INCONTINENCE RELATED TO PROSTATECTOMY.  . Migraines   . Presence of permanent cardiac pacemaker 2013 ?  Marland Kitchen Prostate cancer ([place])   . Pulsatile tinnitus   . Tachy-brady syndrome ([place])    a. developed after RFCA @ [facility] 2013-> MDT PPM;  b. s/p RA lead revision 05/2012    Past Surgical History:  Procedure Laterality Date  . ATRIAL ABLATION SURGERY  04/01/12   "4th time I had one"  . CARDIAC CATHETERIZATION    . CARDIOVERSION  12/03/2011   Procedure: CARDIOVERSION;  Surgeon: [name], MD;  Location: [place];  Service: Cardiovascular;  Laterality: [name];  . CARDIOVERSION  01/13/2012   Procedure: CARDIOVERSION;  Surgeon: Lelon Perla, MD;  Location: [place];  Service: Cardiovascular;  Laterality: [name];  . CARDIOVERSION  04/27/2012   Procedure: CARDIOVERSION;  Surgeon: Darlin Coco, MD;  Location: [place];  Service: Cardiovascular;  Laterality: [name];  . COLONOSCOPY    . COLONOSCOPY WITH PROPOFOL [name] 10/29/2016   Procedure: COLONOSCOPY WITH PROPOFOL;  Surgeon: Doran Stabler, MD;  Location: [facility];  Service: Gastroenterology;  Laterality: [name];  . CORONARY ANGIOPLASTY WITH STENT PLACEMENT  06/02/12   "1; first one ever"  . CORONARY STENT INTERVENTION [name] 07/21/2017   Procedure: CORONARY STENT INTERVENTION;  Surgeon: [name], MD;  Location: [place] CV [facility];  Service: Cardiovascular;  Laterality: [name];  . EP IMPLANTABLE DEVICE [name] 06/17/2016   Procedure: BiV Pacemaker upgrade;  Surgeon: [name], MD;  Location: [place] CV [facility];  Service: Cardiovascular;  Laterality: [name];  . HERNIA REPAIR  1980's  . INGUINAL HERNIA REPAIR  1945   left side  . INSERT / REPLACE / REMOVE PACEMAKER  03/2012    initial placement  . INSERT / REPLACE / REMOVE PACEMAKER  05/25/12   "atrial lead change"  . LEAD REVISION [name] 05/25/2012   Procedure: LEAD REVISION;  Surgeon: Evans Lance, MD;  Location: [facility] CATH LAB;  Service: Cardiovascular;  Laterality: [name];  . LEFT HEART CATH AND CORONARY ANGIOGRAPHY [name] 07/21/2017   Procedure: LEFT HEART CATH AND CORONARY ANGIOGRAPHY;  Surgeon: [name], MD;  Location: [place] CV LAB;  Service: Cardiovascular;  Laterality: [name];  . PERCUTANEOUS CORONARY STENT INTERVENTION (PCI-S) [name] 06/02/2012   Procedure: PERCUTANEOUS CORONARY STENT INTERVENTION (PCI-S);  Surgeon: Hillary Bow, MD;  Location: [facility] CATH LAB;  Service: Cardiovascular;  Laterality: [name];  . PROSTATECTOMY  01/20/1061   COMPLICATED  . SHOULDER SURGERY     Bilateral; "boney spurs removed"  . SKIN CANCER EXCISION     "in situ; right chest"  . TONSILLECTOMY AND ADENOIDECTOMY     "as a child"    Current Outpatient Medications  Medication Sig Dispense Refill  . acyclovir (ZOVIRAX) 400 MG tablet Take 400 mg as directed by mouth. Herpes flare on legs    . aspirin EC 81 MG tablet Take 1 tablet (81 mg total) by mouth daily. 90 tablet 3  . cholecalciferol (VITAMIN D) 1000 UNITS tablet Take 1,000 Units by mouth daily.    . nitroGLYCERIN (NITROSTAT) 0.4 MG SL tablet Place 0.4 mg under the tongue every 5 (five) minutes as needed for chest pain (x 3 tabs).    . propranolol (INDERAL) 10 MG tablet Take 10 mg by mouth 3 (three) times daily as needed. Palpitations or benign central tremor     . vitamin B-12 (CYANOCOBALAMIN) 1000 MCG tablet Take 1,000 mcg by mouth daily.    Alveda Reasons 20 MG TABS tablet TAKE 1 TABLET(20 MG) BY MOUTH DAILY WITH DINNER 90 tablet 1   No current facility-administered medications for this visit.     Allergies  Allergen Reactions  . Ceclor [Cefaclor] Hives  . Chlorhexidine Itching and Rash  . Statins Other (See Comments)    Muscle weakness Other reaction(s): Other (See  Comments) Muscle weakness Muscle weakness   . Ace Inhibitors Other (See Comments)    Does not take due to "angiotensin is responsible for maintaining bladder sphincter tone"    Review of Systems negative except from HPI and PMH  Physical Exam  BP 126/88   Pulse 74   Ht 6' (1.829 m)   Wt 186 lb 12.8 oz (84.7 kg)   SpO2 99%   BMI 25.33 kg/m  Well developed and nourished in no acute distress HENT normal  Neck supple with JVP-flat Clear Regular rate and rhythm, 2/6 early systolic @ LLSB Abd-soft with active BS No Clubbing cyanosis edema Skin-warm and dry A & Oriented  Grossly normal sensory and motor function   ECG demonstrates atrial tach with V pacing -/13/43  Assessment and  Plan  Atrial flutter/fib  Atrial fibrillation s/p ablation [facility] x2  Premature JUNCTIONAL BEATS/Atrial beats  Sinus node dysfunction  Cardiomyopathy-mild-resolved  Pacemaker CRT - Medtronic  The patient's device was interrogated.  The information was reviewed. No changes were made in the programming.    Coronary artery disease s/p  DES stenting 2013>>long term dual therapy recommended per Dr Lilly Cove    Hypertension  Hyponatremia   BP stable off meds  Intermittent atrial tach  Without symptoms of ischemia  On Anticoagulation;  No bleeding issues   Na stable on current meds

## 2018-09-30 LAB — CUP PACEART INCLINIC DEVICE CHECK
Battery Remaining Longevity: 89 mo
Battery Voltage: 2.98 V
Brady Statistic AP VP Percent: 74.53 %
Brady Statistic AP VS Percent: 0.27 %
Brady Statistic AS VS Percent: 8.45 %
Brady Statistic RV Percent Paced: 93.83 %
Date Time Interrogation Session: 20191114193936
Implantable Lead Implant Date: 20170802
Implantable Lead Location: 753859
Implantable Pulse Generator Implant Date: 20170802
Lead Channel Impedance Value: 361 Ohm
Lead Channel Impedance Value: 456 Ohm
Lead Channel Impedance Value: 931 Ohm
Lead Channel Impedance Value: 969 Ohm
Lead Channel Impedance Value: 988 Ohm
Lead Channel Pacing Threshold Amplitude: 0.75 V
Lead Channel Pacing Threshold Amplitude: 1.25 V
Lead Channel Pacing Threshold Pulse Width: 0.4 ms
Lead Channel Pacing Threshold Pulse Width: 1 ms
Lead Channel Sensing Intrinsic Amplitude: 1.875 mV
Lead Channel Setting Pacing Amplitude: 2.5 V
Lead Channel Setting Pacing Pulse Width: 1 ms
Lead Channel Setting Sensing Sensitivity: 4 mV
MDC IDC LEAD IMPLANT DT: 20130517
MDC IDC LEAD IMPLANT DT: 20130710
MDC IDC LEAD LOCATION: 753858
MDC IDC LEAD LOCATION: 753860
MDC IDC MSMT LEADCHNL LV IMPEDANCE VALUE: 931 Ohm
MDC IDC MSMT LEADCHNL LV IMPEDANCE VALUE: 950 Ohm
MDC IDC MSMT LEADCHNL LV IMPEDANCE VALUE: 950 Ohm
MDC IDC MSMT LEADCHNL RA IMPEDANCE VALUE: 361 Ohm
MDC IDC MSMT LEADCHNL RA IMPEDANCE VALUE: 513 Ohm
MDC IDC SET LEADCHNL LV PACING AMPLITUDE: 1.75 V
MDC IDC SET LEADCHNL RA PACING AMPLITUDE: 2 V
MDC IDC SET LEADCHNL RV PACING PULSEWIDTH: 0.4 ms
MDC IDC STAT BRADY AS VP PERCENT: 16.68 %
MDC IDC STAT BRADY RA PERCENT PACED: 54.3 %

## 2018-10-07 ENCOUNTER — Ambulatory Visit
Admission: RE | Admit: 2018-10-07 | Discharge: 2018-10-07 | Disposition: A | Discharge status: Home / Self Care | Payer: Medicare Other | Source: Ambulatory Visit | Attending: Surgery | Admitting: Surgery

## 2018-10-07 DIAGNOSIS — R1012 Left upper quadrant pain: Secondary | ICD-10-CM

## 2018-10-07 MED ORDER — IOHEXOL 300 MG/ML  SOLN
100.0000 mL | Freq: Once | INTRAMUSCULAR | Status: AC | PRN
  Administered 2018-10-07: 100 mL via INTRAVENOUS

## 2018-11-07 ENCOUNTER — Telehealth: Payer: Self-pay | Admitting: Internal Medicine

## 2018-11-07 NOTE — Telephone Encounter (Signed)
Pt calling today to discuss his ongoing rash. He has not been on any ARB's for 2 months and his rash continues. Pt states his BP has began to rise and plans on beginning his valsartan again.   He calls today to inquire if he can/should switch anticoagulants. He believes Eliqus would not be of benefit bc there are many of the same properties in it as Xarelto.   I advised pt I would discuss his request with Dr Caryl Comes and call him back later in the week. Pt verbalized understanding and had no additional questions.

## 2018-11-07 NOTE — Telephone Encounter (Signed)
New Message   Patient is calling because he still has the rash that he has spoken to you about. He is now currently down to 3 medication Xarelto, Propanolol and aspirin. He thinks that the Xarelto may be causing the rash. He thinks that the anticoagulant needs to be changed but does not think he will benefit for Eliquis. In addition the patient states that his BP is erratic    Pt c/o BP issue:  1. What are your last 5 BP readings? 145/95 2. Are you having any other symptoms (ex. Dizziness, headache, blurred vision, passed out)?  No symptoms 3. What is your medication issue?   Patient has been off BP medication for 2 months. He still has valsartan on hand and he wants to know can he restart the BP medication.

## 2018-11-10 NOTE — Telephone Encounter (Signed)
Follow up   Pt is waiting for returned call from discussed medication on 12/23

## 2018-11-14 NOTE — Telephone Encounter (Signed)
Spoke with pt today who states his rash has become more irritating over the weekend. Pt would like to discuss switching anticoagulants and BP medications asap. Our anticoagulation/HTN clinic has agreed to see pt tomorrow (12/31) @ 0930 to discuss a medication plan. Fuller Canada, PharmD is aware of pt's needs and concerns as well.   Pt has agreed with plan and had no additional questions.

## 2018-11-15 ENCOUNTER — Ambulatory Visit (INDEPENDENT_AMBULATORY_CARE_PROVIDER_SITE_OTHER): Payer: Medicare Other | Admitting: Pharmacist

## 2018-11-15 ENCOUNTER — Encounter: Payer: Self-pay | Admitting: Pharmacist

## 2018-11-15 VITALS — BP 140/90 | HR 91

## 2018-11-15 DIAGNOSIS — I10 Essential (primary) hypertension: Secondary | ICD-10-CM

## 2018-11-15 DIAGNOSIS — I2510 Atherosclerotic heart disease of native coronary artery without angina pectoris: Secondary | ICD-10-CM

## 2018-11-15 DIAGNOSIS — I471 Supraventricular tachycardia: Secondary | ICD-10-CM

## 2018-11-15 MED ORDER — SPIRONOLACTONE 25 MG PO TABS: 25.0000 mg | ORAL_TABLET | Freq: Every day | ORAL | 3 refills | Status: DC

## 2018-11-15 MED ORDER — APIXABAN 5 MG PO TABS: 5.0000 mg | ORAL_TABLET | Freq: Two times a day (BID) | ORAL | 3 refills | Status: AC

## 2018-11-15 NOTE — Patient Instructions (Addendum)
Start taking spironolactone 25mg  daily  Continue to check your blood pressure at home. Bring both your log and your meter with you to your next appointment.  Call us at [phone] with any questions or concerns

## 2018-11-15 NOTE — Progress Notes (Signed)
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 Caryl Comes to HTN clinic. PMH is significant for aflutter, atrial tachycardia s/p ablation x 4 now with PM due to heart block, HTN, CAD s/p PCI w/ DES w/ long term anticoagulation and antiplatelet therapy recommended, CHF EF 40-45% (previously 35-40%).   Patient has been having a rash/itching for almost a year now. He states it comes and goes but always comes back in the same spots. He was seen by dermatology who did a biopsy and stated that it was a drug rash. Originally thought to be due to his Zetia, however he has been off of medication for about a year with no improvement. Then thought to be from an ARB. Patient held his losartan then was switched to valsartan with no improvement. ARB was held and patient was started on aldactazide but this had to be stopped due to hyponatermia.   Patient's blood pressure was well controlled off all BP medications (with the exception for propranolol 10-20mg  daily he takes for tremor/migranes) for about a month or so. However, his pressure has increased up to 150's/90's recently and patient became concerned. He restarted losartan about a week or two ago because of this. Stated that he had more itching but not sure if it was just a coincidence since it is cyclic. He also has tried cutting out nuts from his diet with no results.  Patient presents today wishing to start with a clear slate as far as his medications is concerned to figure out what is causing the rash.   BMP 09/26/18 Scr 1.07, K 4.4 Na 133 (off aldactazide for 1 month)  Current HTN meds: none (propranolol 10-20mg  daily for tremor/migraines) Previously tried: losartan, valsartan (rash, itching), aldactazide (low grade HA, hyponatermia), BP goal: <130/80  Family History: The patient's family history includes Colon cancer in his sister; Diabetes in his  brother; Heart disease in his brother and father.   Social History: The patient  reports that he has never smoked. He has never used smokeless tobacco. He reports that he drinks about 1.5 oz of alcohol per week. He reports that he does not use drugs.  Home BP readings: off all meds 148/97, 119/82, 141/94, 126/89, on losartan (just recently restarted) 153/93, 132/93, 146/97  Wt Readings from Last 3 Encounters:  09/29/18 186 lb 12.8 oz (84.7 kg)  06/21/18 186 lb (84.4 kg)  03/28/18 186 lb 1.9 oz (84.4 kg)   BP Readings from Last 3 Encounters:  11/15/18 140/90  09/29/18 126/88  03/28/18 118/86   Pulse Readings from Last 3 Encounters:  11/15/18 91  09/29/18 74  03/28/18 76    Renal function: CrCl cannot be calculated (Patient's most recent lab result is older than the maximum 21 days allowed.).  Past Medical History:  Diagnosis Date  . Atrial flutter -atypical 2007   ABLATION x2 [facility]  . Atrial tachycardia ([place]) 2007   ABLATION [facility]  . Benign paroxysmal vertigo   . CAD (coronary artery disease)    a. abnl mv 01/2012;  b. cath 05/23/2012 LCX 80@ bifurcation w/ OM, 95% @ AV groove, otw nonobs dzs, EF 55-65%;  . Carcinoma ([place])    skin  cancer "off my chest"  . Familial tremor   . First degree AV block    had 4 ablasions that left py with heart block  . History of blood transfusion 2010   "2nd day after prostatectomy"  . Hyperlipidemia   . Hypertension   . Incontinence    DECREASING INCONTINENCE RELATED TO PROSTATECTOMY.  . Migraines   . Presence of permanent cardiac pacemaker 2013 ?  Marland Kitchen Prostate cancer ([place])   . Pulsatile tinnitus   . Tachy-brady syndrome ([place])    a. developed after RFCA @ [facility] 2013-> MDT PPM;  b. s/p RA lead revision 05/2012    Current Outpatient Medications on File Prior to Visit  Medication Sig Dispense Refill  . acyclovir (ZOVIRAX) 400 MG tablet Take 400 mg as directed by mouth. Herpes flare on legs    . aspirin EC 81 MG tablet Take 1 tablet (81 mg  total) by mouth daily. 90 tablet 3  . cholecalciferol (VITAMIN D) 1000 UNITS tablet Take 1,000 Units by mouth daily.    . nitroGLYCERIN (NITROSTAT) 0.4 MG SL tablet Place 0.4 mg under the tongue every 5 (five) minutes as needed for chest pain (x 3 tabs).    . propranolol (INDERAL) 10 MG tablet Take 10 mg by mouth 3 (three) times daily as needed. Palpitations or benign central tremor     . vitamin B-12 (CYANOCOBALAMIN) 1000 MCG tablet Take 1,000 mcg by mouth daily.     No current facility-administered medications on file prior to visit.     Allergies  Allergen Reactions  . Ceclor [Cefaclor] Hives  . Chlorhexidine Itching and Rash  . Statins Other (See Comments)    Muscle weakness Other reaction(s): Other (See Comments) Muscle weakness Muscle weakness   . Ace Inhibitors Other (See Comments)    Does not take due to "angiotensin is responsible for maintaining bladder sphincter tone"    Blood pressure 140/90, pulse 91, SpO2 98 %.   Assessment/Plan:  1. Hypertension - Blood pressure not at goal of <130/80. Patient has a history of reduced EF, now with midrange EF. Would ideally like to use a medication that has evidence to reduce either hospitalization/mortaility in HF patients. ARB and MRA both have decent evidence in patients with midrange EF. Would be best to avoid ARB at this time due to patient's rash. Patient does have a history of K in the higher end of normal, but I think it would be safe to start spironolactone with close monitoring. Patient is in agreement to stop taking losartan and start spironolactone 25mg  daily with follow up labs/BP check in 2 weeks.  Discussed changing beta blocker however metoprolol and carvedilol can cause a tremor. Patient wants to continue to take propranolol for now, which I think is reasonable.  2. Anticoagulation- Discussed alternative anticoagulation options with patient. Patient has been on warfarin in the past without any reaction. However from a  safety standpoint, I believe apixaban is safer than warfarin. Plus there is no evidence of cross reactivity between NOACs according to a study published this year in [facility]. Xarelto and Pradaxa have the highest rates of hypersensitivity reactions. Therefore patient and I are in mutual agreement to try apixaban 5mg  BID. Side effects/dosing reviewed with patient and he was provided with a free 30 day coupon.  Will get a CBC and BMP at follow up.   Ramond Dial, Pharm.D, [place]  [address] [place], [place] [address]  Phone: [phone]; Fax: [phone]

## 2018-11-17 ENCOUNTER — Telehealth: Payer: Self-pay

## 2018-11-17 NOTE — Telephone Encounter (Signed)
noted 

## 2018-11-17 NOTE — Telephone Encounter (Signed)
Informed [name] that per Dr. Saunders Revel, it is OK to hold ASA for the time being. [name] states he rather wait for Dr. Antionette Char recommendations on ASA when he returns next week.

## 2018-11-17 NOTE — Telephone Encounter (Signed)
-----   Message from Nelva Bush, MD sent at 11/17/2018 11:47 AM EST ----- Regarding: RE: ASA I am covering for Dr. Burt Knack while he is out of the office.  If there is concern for allergic reaction to ASA, I think it is okay to stop it for now.  Mr. Bayona is > 12 months out from his last PCI and will remain on rivaroxaban.  I will defer long-term plans to Dr. Burt Knack when he is back in the office.  Nelva Bush, MD [facility] Pager: [phone] ----- Message ----- From: Dollene Primrose, RN Sent: 11/14/2018   9:51 AM EST To: [name], MD, Leeroy Bock, [facility], # Subject: ASA                                            Good Morning Dr Burt Knack,  Dr Prescott Parma is having some issues with an ongoing rash, which we believed came from his ARB. After discontinuing his ARB, his rash continued. Pt plans to switch his anticoagulant and his BP meds.  Pt would like to know if he may stop his ASA while he is trying to pinpoint what is causing his rash. I told him I'd send you a message since you are the prescribe.  Thanks!  [place]

## 2018-11-20 NOTE — Telephone Encounter (Signed)
I would rather him stay on some type of antiplatelet Rx if possible. Is he able to make other changes, such as a different anticoagulant, and continue ASA 81 mg? If there is strong suspicion that ASA is the culprit for his rash, I am ok with him stopping for a period of time. However, considering the extent of his CAD and prior PCI procedures, I think he will have a lower risk of adverse ischemic events if he can stay on antiplatelet Rx.

## 2018-11-22 NOTE — Telephone Encounter (Signed)
Reviewed Dr. Antionette Char notes with patient. He understands he can hold ASA if absolutely necessary, but will ultimately need to be on antiplatelet long term. [name] states he switched to Eliquis from Xarelto and to spironolactone from losartan so he will wait to see if those improve rash at this time.  He will call if symptoms do not improve with these changes first. He was grateful for assistance.

## 2018-11-25 ENCOUNTER — Other Ambulatory Visit: Payer: Medicare Other | Admitting: *Deleted

## 2018-11-25 DIAGNOSIS — I471 Supraventricular tachycardia: Secondary | ICD-10-CM

## 2018-11-25 DIAGNOSIS — I10 Essential (primary) hypertension: Secondary | ICD-10-CM

## 2018-11-26 LAB — BASIC METABOLIC PANEL
BUN/Creatinine Ratio: 11 (ref 10–24)
BUN: 13 mg/dL (ref 8–27)
CO2: 24 mmol/L (ref 20–29)
CREATININE: 1.18 mg/dL (ref 0.76–1.27)
Calcium: 9.3 mg/dL (ref 8.6–10.2)
Chloride: 99 mmol/L (ref 96–106)
GFR, EST AFRICAN AMERICAN: 70 mL/min/{1.73_m2} (ref >59)
GFR, EST NON AFRICAN AMERICAN: 60 mL/min/{1.73_m2} (ref >59)
Glucose: 87 mg/dL (ref 65–99)
Potassium: 4.6 mmol/L (ref 3.5–5.2)
SODIUM: 135 mmol/L (ref 134–144)

## 2018-11-26 LAB — CBC
HEMATOCRIT: 38.7 % (ref 37.5–51.0)
Hemoglobin: 13.6 g/dL (ref 13.0–17.7)
MCH: 30.8 pg (ref 26.6–33.0)
MCHC: 35.1 g/dL (ref 31.5–35.7)
MCV: 88 fL (ref 79–97)
Platelets: 177 10*3/uL (ref 150–450)
RBC: 4.41 x10E6/uL (ref 4.14–5.80)
RDW: 11.9 % (ref 11.6–15.4)
WBC: 6.3 10*3/uL (ref 3.4–10.8)

## 2018-11-28 LAB — CUP PACEART REMOTE DEVICE CHECK
Battery Remaining Longevity: 91 mo
Battery Voltage: 2.98 V
Brady Statistic AP VP Percent: 74.36 %
Brady Statistic AS VS Percent: 8.66 %
Brady Statistic RV Percent Paced: 93.99 %
Implantable Lead Implant Date: 20130517
Implantable Lead Implant Date: 20130710
Implantable Lead Implant Date: 20170802
Implantable Lead Location: 753858
Implantable Lead Location: 753859
Implantable Lead Location: 753860
Lead Channel Impedance Value: 361 Ohm
Lead Channel Impedance Value: 513 Ohm
Lead Channel Impedance Value: 532 Ohm
Lead Channel Impedance Value: 532 Ohm
Lead Channel Impedance Value: 931 Ohm
Lead Channel Impedance Value: 950 Ohm
Lead Channel Impedance Value: 950 Ohm
Lead Channel Impedance Value: 969 Ohm
Lead Channel Pacing Threshold Amplitude: 1 V
Lead Channel Pacing Threshold Amplitude: 1.375 V
Lead Channel Pacing Threshold Pulse Width: 0.4 ms
Lead Channel Sensing Intrinsic Amplitude: 1.375 mV
Lead Channel Sensing Intrinsic Amplitude: 1.375 mV
Lead Channel Sensing Intrinsic Amplitude: 13.25 mV
Lead Channel Sensing Intrinsic Amplitude: 13.25 mV
Lead Channel Setting Pacing Amplitude: 2 V
Lead Channel Setting Pacing Pulse Width: 0.4 ms
Lead Channel Setting Pacing Pulse Width: 1 ms
Lead Channel Setting Sensing Sensitivity: 4 mV
MDC IDC MSMT LEADCHNL LV IMPEDANCE VALUE: 494 Ohm
MDC IDC MSMT LEADCHNL LV IMPEDANCE VALUE: 551 Ohm
MDC IDC MSMT LEADCHNL LV IMPEDANCE VALUE: 931 Ohm
MDC IDC MSMT LEADCHNL LV IMPEDANCE VALUE: 988 Ohm
MDC IDC MSMT LEADCHNL LV PACING THRESHOLD PULSEWIDTH: 0.8 ms
MDC IDC MSMT LEADCHNL RA PACING THRESHOLD PULSEWIDTH: 0.4 ms
MDC IDC MSMT LEADCHNL RV IMPEDANCE VALUE: 361 Ohm
MDC IDC MSMT LEADCHNL RV IMPEDANCE VALUE: 456 Ohm
MDC IDC MSMT LEADCHNL RV PACING THRESHOLD AMPLITUDE: 0.625 V
MDC IDC PG IMPLANT DT: 20170802
MDC IDC SESS DTM: 20191114132552
MDC IDC SET LEADCHNL LV PACING AMPLITUDE: 1.5 V
MDC IDC SET LEADCHNL RV PACING AMPLITUDE: 2.5 V
MDC IDC STAT BRADY AP VS PERCENT: 0.29 %
MDC IDC STAT BRADY AS VP PERCENT: 16.63 %
MDC IDC STAT BRADY RA PERCENT PACED: 51.48 %

## 2018-11-29 ENCOUNTER — Ambulatory Visit (INDEPENDENT_AMBULATORY_CARE_PROVIDER_SITE_OTHER): Payer: Medicare Other | Admitting: Pharmacist

## 2018-11-29 VITALS — BP 130/86 | HR 70

## 2018-11-29 DIAGNOSIS — I10 Essential (primary) hypertension: Secondary | ICD-10-CM

## 2018-11-29 NOTE — Patient Instructions (Signed)
Pick up a new blood pressure meter. [employer] is the preferred brand.  Continue to check your blood pressure once or twice a day. Call clinic in 1-2 weeks with results. [phone]

## 2018-11-29 NOTE — Progress Notes (Signed)
Patient ID: Walter Mt, [name]                 DOB: Jul 11, 1946                      MRN: [id number]     HPI: Walter Mt, [name] is a 73 y.o. male referred by Dr. Caryl Comes to HTN clinic. PMH is significant for aflutter, atrial tachycardia s/p ablation x 4 now with PM due to heart block, HTN, CAD s/p PCI w/ DES w/ long term anticoagulation and antiplatelet therapy recommended, CHF EF 40-45% (previously 35-40%).   Patient has been having a rash/itching for almost a year now. He states it comes and goes but always comes back in the same spots. He was seen by dermatology who did a biopsy and stated that it was a drug rash. Originally thought to be due to his Zetia, however he has been off of medication for about a year with no improvement. Then thought to be from an ARB. Patient held his losartan then was switched to valsartan with no improvement. ARB was held and patient was started on aldactazide but this had to be stopped due to hyponatermia.   Patient's blood pressure was well controlled off all BP medications (with the exception for propranolol 10-20mg  daily he takes for tremor/migranes) for about a month or so. However, his pressure has increased up to 150's/90's recently and patient became concerned. He restarted losartan about a week or two ago because of this. Stated that he had more itching but not sure if it was just a coincidence since it is cyclic. He also has tried cutting out nuts from his diet with no results.  At last visit about 2 weeks ago losartan was stopped and spironolactone was started. Patient Xarelto was also changed to Eliquis in order to figure out what is causing the rash. Only medication that remained the same was propanolol for patient tremor. Repeat BMP and CBC were WNL.  Patient states that he does have some dizziness, light headedness, and orthostatics but this is not out of the ordinary for him does not not believe is related to the medication. Denies any falls, headache or blurred  vision.   Current HTN meds: spironolactone 25mg  daily (propranolol 10-20mg  daily for tremor/migraines) Previously tried: losartan, valsartan (rash, itching), aldactazide (low grade HA, hyponatermia), BP goal: <130/80  Family History: The patient's family history includes Colon cancer in his sister; Diabetes in his brother; Heart disease in his brother and father.   Social History: The patient  reports that he has never smoked. He has never used smokeless tobacco. He reports that he drinks about 1.5 oz of alcohol per week. He reports that he does not use drugs.  Home BP readings: 141/92, 138/94, 132/90, 153/95, 130/85, 122/92, 141/97, 119/87, 126/88, 149/98, 139/89, 131/86, 153/95 Highest first thing in AM and then again at night  Wt Readings from Last 3 Encounters:  09/29/18 186 lb 12.8 oz (84.7 kg)  06/21/18 186 lb (84.4 kg)  03/28/18 186 lb 1.9 oz (84.4 kg)   BP Readings from Last 3 Encounters:  11/29/18 130/86  11/15/18 140/90  09/29/18 126/88   Pulse Readings from Last 3 Encounters:  11/29/18 70  11/15/18 91  09/29/18 74    Renal function: CrCl cannot be calculated (Unknown ideal weight.).  Past Medical History:  Diagnosis Date  . Atrial flutter -atypical 2007   ABLATION x2 [facility]  . Atrial tachycardia ([place]) 2007  ABLATION [facility]  . Benign paroxysmal vertigo   . CAD (coronary artery disease)    a. abnl mv 01/2012;  b. cath 05/23/2012 LCX 80@ bifurcation w/ OM, 95% @ AV groove, otw nonobs dzs, EF 55-65%;  . Carcinoma ([place])    skin cancer "off my chest"  . Familial tremor   . First degree AV block    had 4 ablasions that left py with heart block  . History of blood transfusion 2010   "2nd day after prostatectomy"  . Hyperlipidemia   . Hypertension   . Incontinence    DECREASING INCONTINENCE RELATED TO PROSTATECTOMY.  . Migraines   . Presence of permanent cardiac pacemaker 2013 ?  Marland Kitchen Prostate cancer ([place])   . Pulsatile tinnitus   . Tachy-brady syndrome ([place])    a.  developed after RFCA @ [facility] 2013-> MDT PPM;  b. s/p RA lead revision 05/2012    Current Outpatient Medications on File Prior to Visit  Medication Sig Dispense Refill  . acyclovir (ZOVIRAX) 400 MG tablet Take 400 mg as directed by mouth. Herpes flare on legs    . apixaban (ELIQUIS) 5 MG TABS tablet Take 1 tablet (5 mg total) by mouth 2 (two) times daily. 60 tablet 3  . aspirin EC 81 MG tablet Take 1 tablet (81 mg total) by mouth daily. 90 tablet 3  . cholecalciferol (VITAMIN D) 1000 UNITS tablet Take 1,000 Units by mouth daily.    . nitroGLYCERIN (NITROSTAT) 0.4 MG SL tablet Place 0.4 mg under the tongue every 5 (five) minutes as needed for chest pain (x 3 tabs).    . propranolol (INDERAL) 10 MG tablet Take 10 mg by mouth 3 (three) times daily as needed. Palpitations or benign central tremor     . spironolactone (ALDACTONE) 25 MG tablet Take 1 tablet (25 mg total) by mouth daily. 30 tablet 3  . vitamin B-12 (CYANOCOBALAMIN) 1000 MCG tablet Take 1,000 mcg by mouth daily.     No current facility-administered medications on file prior to visit.     Allergies  Allergen Reactions  . Ceclor [Cefaclor] Hives  . Chlorhexidine Itching and Rash  . Statins Other (See Comments)    Muscle weakness Other reaction(s): Other (See Comments) Muscle weakness Muscle weakness   . Ace Inhibitors Other (See Comments)    Does not take due to "angiotensin is responsible for maintaining bladder sphincter tone"    Blood pressure 130/86, pulse 70, SpO2 98 %.   Assessment/Plan:  1. Hypertension - Patient blood pressure in clinic is very close to goal of <130/80. Very similar readings in each arm. His home readings were more elevated and variable. He brought his meter into the office. Compared to clinic reading, his home blood pressure meter is reading much higher. Patient is going to purchase a new meter. I recommended an upper arm cuff made by [employer]. Discussed option of adding amlodipine 2.5mg  daily or  continue just spironolactone 25mg  daily and checking blood pressure at home with new cuff for another week or two. Patient opted to monitor blood pressure before making any changes. Patient will call clinic is 1-2 weeks with readings. If they are still running above goal, we will add amlodipine 2.5mg  daily.  2. Anticoagulation- patient tolerating apixaban well. No s/sx of bleeding. CBC WNL. Patient rash still present but he is seeing an allergist soon.  Ramond Dial, Pharm.D, [facility]  [address] [place], [place] [address]  Phone: [phone];  Fax: [phone]

## 2018-12-12 ENCOUNTER — Other Ambulatory Visit: Payer: Self-pay

## 2018-12-12 ENCOUNTER — Emergency Department (HOSPITAL_COMMUNITY): Payer: Medicare Other

## 2018-12-12 ENCOUNTER — Telehealth: Payer: Self-pay | Admitting: Cardiology

## 2018-12-12 ENCOUNTER — Encounter (HOSPITAL_COMMUNITY): Payer: Self-pay

## 2018-12-12 ENCOUNTER — Inpatient Hospital Stay (HOSPITAL_COMMUNITY)
Admission: EM | Admit: 2018-12-12 | Discharge: 2018-12-14 | DRG: 247 | Disposition: A | Discharge status: Home / Self Care | Payer: Medicare Other | Attending: Cardiology | Admitting: Cardiology

## 2018-12-12 DIAGNOSIS — E785 Hyperlipidemia, unspecified: Secondary | ICD-10-CM | POA: Diagnosis present

## 2018-12-12 DIAGNOSIS — Z8546 Personal history of malignant neoplasm of prostate: Secondary | ICD-10-CM

## 2018-12-12 DIAGNOSIS — I25110 Atherosclerotic heart disease of native coronary artery with unstable angina pectoris: Secondary | ICD-10-CM | POA: Diagnosis not present

## 2018-12-12 DIAGNOSIS — I2510 Atherosclerotic heart disease of native coronary artery without angina pectoris: Secondary | ICD-10-CM | POA: Diagnosis present

## 2018-12-12 DIAGNOSIS — E871 Hypo-osmolality and hyponatremia: Secondary | ICD-10-CM | POA: Diagnosis present

## 2018-12-12 DIAGNOSIS — I10 Essential (primary) hypertension: Secondary | ICD-10-CM | POA: Diagnosis present

## 2018-12-12 DIAGNOSIS — N39498 Other specified urinary incontinence: Secondary | ICD-10-CM | POA: Diagnosis present

## 2018-12-12 DIAGNOSIS — I495 Sick sinus syndrome: Secondary | ICD-10-CM | POA: Diagnosis present

## 2018-12-12 DIAGNOSIS — Z888 Allergy status to other drugs, medicaments and biological substances status: Secondary | ICD-10-CM

## 2018-12-12 DIAGNOSIS — I200 Unstable angina: Secondary | ICD-10-CM

## 2018-12-12 DIAGNOSIS — R079 Chest pain, unspecified: Secondary | ICD-10-CM

## 2018-12-12 DIAGNOSIS — Z7982 Long term (current) use of aspirin: Secondary | ICD-10-CM

## 2018-12-12 DIAGNOSIS — Z955 Presence of coronary angioplasty implant and graft: Secondary | ICD-10-CM

## 2018-12-12 DIAGNOSIS — Z79899 Other long term (current) drug therapy: Secondary | ICD-10-CM

## 2018-12-12 DIAGNOSIS — Z7901 Long term (current) use of anticoagulants: Secondary | ICD-10-CM

## 2018-12-12 DIAGNOSIS — I4892 Unspecified atrial flutter: Secondary | ICD-10-CM | POA: Diagnosis present

## 2018-12-12 DIAGNOSIS — I119 Hypertensive heart disease without heart failure: Secondary | ICD-10-CM | POA: Diagnosis present

## 2018-12-12 DIAGNOSIS — R21 Rash and other nonspecific skin eruption: Secondary | ICD-10-CM | POA: Diagnosis present

## 2018-12-12 DIAGNOSIS — Z950 Presence of cardiac pacemaker: Secondary | ICD-10-CM

## 2018-12-12 DIAGNOSIS — I4891 Unspecified atrial fibrillation: Secondary | ICD-10-CM | POA: Diagnosis present

## 2018-12-12 DIAGNOSIS — I429 Cardiomyopathy, unspecified: Secondary | ICD-10-CM | POA: Diagnosis present

## 2018-12-12 LAB — CBC
HCT: 40.8 % (ref 39.0–52.0)
Hemoglobin: 13.4 g/dL (ref 13.0–17.0)
MCH: 30 pg (ref 26.0–34.0)
MCHC: 32.8 g/dL (ref 30.0–36.0)
MCV: 91.5 fL (ref 80.0–100.0)
Platelets: 162 10*3/uL (ref 150–400)
RBC: 4.46 MIL/uL (ref 4.22–5.81)
RDW: 12.5 % (ref 11.5–15.5)
WBC: 5.6 10*3/uL (ref 4.0–10.5)
nRBC: 0 % (ref 0.0–0.2)

## 2018-12-12 LAB — I-STAT TROPONIN, ED: Troponin i, poc: 0.01 ng/mL (ref 0.00–0.08)

## 2018-12-12 LAB — BASIC METABOLIC PANEL
Anion gap: 9 (ref 5–15)
BUN: 14 mg/dL (ref 8–23)
CHLORIDE: 101 mmol/L (ref 98–111)
CO2: 26 mmol/L (ref 22–32)
Calcium: 9.2 mg/dL (ref 8.9–10.3)
Creatinine, Ser: 1.28 mg/dL — ABNORMAL HIGH (ref 0.61–1.24)
GFR calc Af Amer: >60 mL/min (ref >60)
GFR calc non Af Amer: 55 mL/min — ABNORMAL LOW (ref >60)
Glucose, Bld: 87 mg/dL (ref 70–99)
Potassium: 4.5 mmol/L (ref 3.5–5.1)
SODIUM: 136 mmol/L (ref 135–145)

## 2018-12-12 MED ORDER — SODIUM CHLORIDE 0.9% FLUSH: 3.0000 mL | Freq: Once | INTRAVENOUS | Status: DC

## 2018-12-12 NOTE — ED Triage Notes (Signed)
Pt here with chest pain while walking.  Called on call for cardiologist and requested he come here for evaluation.  Hx of ablations, stent placement last in 2018.  [employer]onic pacemaker.

## 2018-12-12 NOTE — ED Notes (Signed)
[employer] pacemaker interrogated at 20:18

## 2018-12-12 NOTE — Telephone Encounter (Signed)
  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 Burt Knack and Dr. Caryl Comes. H/o known CAD w/ prior stenting in the proximal LAD and LCx, last intervention was in 2018. Also h/o systolic HF s/p CRT>> responder with normalization of EF. Also h/o afib s/p ablation and HLD intolerant to statins. He reports recurrent unstable angina, increasing in intensity over the last few days w/ minimal exertion. Pt advised to come to the ED. Recommend admission and likely LHC in the AM. Pt agreeable to plan and will come to [facility] ED.

## 2018-12-13 ENCOUNTER — Inpatient Hospital Stay (HOSPITAL_COMMUNITY)
Admission: EM | Disposition: A | Discharge status: Home / Self Care | Payer: Self-pay | Source: Home / Self Care | Attending: Cardiology

## 2018-12-13 DIAGNOSIS — Z7901 Long term (current) use of anticoagulants: Secondary | ICD-10-CM | POA: Diagnosis not present

## 2018-12-13 DIAGNOSIS — I480 Paroxysmal atrial fibrillation: Secondary | ICD-10-CM | POA: Diagnosis not present

## 2018-12-13 DIAGNOSIS — Z888 Allergy status to other drugs, medicaments and biological substances status: Secondary | ICD-10-CM | POA: Diagnosis not present

## 2018-12-13 DIAGNOSIS — I119 Hypertensive heart disease without heart failure: Secondary | ICD-10-CM | POA: Diagnosis present

## 2018-12-13 DIAGNOSIS — Z79899 Other long term (current) drug therapy: Secondary | ICD-10-CM | POA: Diagnosis not present

## 2018-12-13 DIAGNOSIS — I370 Nonrheumatic pulmonary valve stenosis: Secondary | ICD-10-CM | POA: Diagnosis not present

## 2018-12-13 DIAGNOSIS — Z8546 Personal history of malignant neoplasm of prostate: Secondary | ICD-10-CM | POA: Diagnosis not present

## 2018-12-13 DIAGNOSIS — I200 Unstable angina: Secondary | ICD-10-CM | POA: Diagnosis not present

## 2018-12-13 DIAGNOSIS — I4892 Unspecified atrial flutter: Secondary | ICD-10-CM | POA: Diagnosis present

## 2018-12-13 DIAGNOSIS — Z955 Presence of coronary angioplasty implant and graft: Secondary | ICD-10-CM | POA: Diagnosis not present

## 2018-12-13 DIAGNOSIS — E871 Hypo-osmolality and hyponatremia: Secondary | ICD-10-CM | POA: Diagnosis present

## 2018-12-13 DIAGNOSIS — I25110 Atherosclerotic heart disease of native coronary artery with unstable angina pectoris: Secondary | ICD-10-CM | POA: Diagnosis present

## 2018-12-13 DIAGNOSIS — E785 Hyperlipidemia, unspecified: Secondary | ICD-10-CM

## 2018-12-13 DIAGNOSIS — R21 Rash and other nonspecific skin eruption: Secondary | ICD-10-CM | POA: Diagnosis present

## 2018-12-13 DIAGNOSIS — N39498 Other specified urinary incontinence: Secondary | ICD-10-CM | POA: Diagnosis present

## 2018-12-13 DIAGNOSIS — I4891 Unspecified atrial fibrillation: Secondary | ICD-10-CM | POA: Diagnosis present

## 2018-12-13 DIAGNOSIS — Z950 Presence of cardiac pacemaker: Secondary | ICD-10-CM | POA: Diagnosis not present

## 2018-12-13 DIAGNOSIS — I361 Nonrheumatic tricuspid (valve) insufficiency: Secondary | ICD-10-CM | POA: Diagnosis not present

## 2018-12-13 DIAGNOSIS — I495 Sick sinus syndrome: Secondary | ICD-10-CM | POA: Diagnosis present

## 2018-12-13 DIAGNOSIS — I10 Essential (primary) hypertension: Secondary | ICD-10-CM | POA: Diagnosis not present

## 2018-12-13 DIAGNOSIS — I429 Cardiomyopathy, unspecified: Secondary | ICD-10-CM | POA: Diagnosis present

## 2018-12-13 DIAGNOSIS — Z7982 Long term (current) use of aspirin: Secondary | ICD-10-CM | POA: Diagnosis not present

## 2018-12-13 HISTORY — PX: LEFT HEART CATH AND CORONARY ANGIOGRAPHY: CATH118249

## 2018-12-13 HISTORY — PX: CORONARY STENT INTERVENTION: CATH118234

## 2018-12-13 LAB — BASIC METABOLIC PANEL
Anion gap: 9 (ref 5–15)
BUN: 12 mg/dL (ref 8–23)
CO2: 27 mmol/L (ref 22–32)
CREATININE: 0.98 mg/dL (ref 0.61–1.24)
Calcium: 9.2 mg/dL (ref 8.9–10.3)
Chloride: 100 mmol/L (ref 98–111)
GFR calc Af Amer: >60 mL/min (ref >60)
GFR calc non Af Amer: >60 mL/min (ref >60)
Glucose, Bld: 101 mg/dL — ABNORMAL HIGH (ref 70–99)
Potassium: 4.5 mmol/L (ref 3.5–5.1)
Sodium: 136 mmol/L (ref 135–145)

## 2018-12-13 LAB — POCT ACTIVATED CLOTTING TIME
Activated Clotting Time: 263 seconds
Activated Clotting Time: 329 seconds

## 2018-12-13 LAB — I-STAT TROPONIN, ED
TROPONIN I, POC: 0.01 ng/mL (ref 0.00–0.08)
Troponin i, poc: 0.01 ng/mL (ref 0.00–0.08)

## 2018-12-13 LAB — HEPARIN LEVEL (UNFRACTIONATED): Heparin Unfractionated: 1.16 IU/mL — ABNORMAL HIGH (ref 0.30–0.70)

## 2018-12-13 LAB — APTT: aPTT: 101 seconds — ABNORMAL HIGH (ref 24–36)

## 2018-12-13 SURGERY — LEFT HEART CATH AND CORONARY ANGIOGRAPHY
Anesthesia: LOCAL

## 2018-12-13 MED ORDER — ASPIRIN EC 81 MG PO TBEC: 81.0000 mg | DELAYED_RELEASE_TABLET | Freq: Every day | ORAL | Status: DC

## 2018-12-13 MED ORDER — MIDAZOLAM HCL 2 MG/2ML IJ SOLN
INTRAMUSCULAR | Status: AC
  Filled 2018-12-13: qty 2

## 2018-12-13 MED ORDER — HEPARIN (PORCINE) IN NACL 1000-0.9 UT/500ML-% IV SOLN
INTRAVENOUS | Status: DC | PRN
  Administered 2018-12-13: 500 mL

## 2018-12-13 MED ORDER — HEPARIN (PORCINE) IN NACL 1000-0.9 UT/500ML-% IV SOLN
INTRAVENOUS | Status: AC
  Filled 2018-12-13: qty 1000

## 2018-12-13 MED ORDER — FENTANYL CITRATE (PF) 100 MCG/2ML IJ SOLN
INTRAMUSCULAR | Status: AC
  Filled 2018-12-13: qty 2

## 2018-12-13 MED ORDER — ASPIRIN 81 MG PO CHEW: 81.0000 mg | CHEWABLE_TABLET | Freq: Every day | ORAL | Status: DC

## 2018-12-13 MED ORDER — CLOPIDOGREL BISULFATE 300 MG PO TABS
ORAL_TABLET | ORAL | Status: AC
  Filled 2018-12-13: qty 1

## 2018-12-13 MED ORDER — SODIUM CHLORIDE 0.9% FLUSH: 3.0000 mL | INTRAVENOUS | Status: DC | PRN

## 2018-12-13 MED ORDER — HEPARIN BOLUS VIA INFUSION
4000.0000 [IU] | Freq: Once | INTRAVENOUS | Status: AC
  Administered 2018-12-13: 4000 [IU] via INTRAVENOUS
  Filled 2018-12-13: qty 4000

## 2018-12-13 MED ORDER — HEPARIN SODIUM (PORCINE) 1000 UNIT/ML IJ SOLN
INTRAMUSCULAR | Status: DC | PRN
  Administered 2018-12-13 (×2): 4000 [IU] via INTRAVENOUS
  Administered 2018-12-13: 2000 [IU] via INTRAVENOUS

## 2018-12-13 MED ORDER — CLOPIDOGREL BISULFATE 75 MG PO TABS
75.0000 mg | ORAL_TABLET | Freq: Every day | ORAL | Status: DC
  Filled 2018-12-13: qty 1

## 2018-12-13 MED ORDER — SODIUM CHLORIDE 0.9 % WEIGHT BASED INFUSION
1.0000 mL/kg/h | INTRAVENOUS | Status: AC
  Administered 2018-12-13: 18:00:00 1 mL/kg/h via INTRAVENOUS

## 2018-12-13 MED ORDER — ANGIOPLASTY BOOK
Freq: Once | Status: AC
  Administered 2018-12-13: 22:00:00
  Filled 2018-12-13: qty 1

## 2018-12-13 MED ORDER — VERAPAMIL HCL 2.5 MG/ML IV SOLN
INTRAVENOUS | Status: DC | PRN
  Administered 2018-12-13 (×4): 200 ug via INTRACORONARY

## 2018-12-13 MED ORDER — SODIUM CHLORIDE 0.9% FLUSH
3.0000 mL | Freq: Two times a day (BID) | INTRAVENOUS | Status: DC
  Administered 2018-12-13: 3 mL via INTRAVENOUS

## 2018-12-13 MED ORDER — LIDOCAINE HCL (PF) 1 % IJ SOLN
INTRAMUSCULAR | Status: AC
  Filled 2018-12-13: qty 30

## 2018-12-13 MED ORDER — NITROGLYCERIN 1 MG/10 ML FOR IR/CATH LAB
INTRA_ARTERIAL | Status: AC
  Filled 2018-12-13: qty 10

## 2018-12-13 MED ORDER — SPIRONOLACTONE 25 MG PO TABS
25.0000 mg | ORAL_TABLET | Freq: Every day | ORAL | Status: DC
  Administered 2018-12-13: 25 mg via ORAL
  Filled 2018-12-13 (×2): qty 1

## 2018-12-13 MED ORDER — SODIUM CHLORIDE 0.9 % IV SOLN: 250.0000 mL | INTRAVENOUS | Status: DC | PRN

## 2018-12-13 MED ORDER — SODIUM CHLORIDE 0.9 % WEIGHT BASED INFUSION: 3.0000 mL/kg/h | INTRAVENOUS | Status: DC

## 2018-12-13 MED ORDER — VERAPAMIL HCL 2.5 MG/ML IV SOLN
INTRAVENOUS | Status: DC | PRN
  Administered 2018-12-13: 10 mL via INTRA_ARTERIAL

## 2018-12-13 MED ORDER — LIDOCAINE HCL (PF) 1 % IJ SOLN
INTRAMUSCULAR | Status: DC | PRN
  Administered 2018-12-13: 2 mL

## 2018-12-13 MED ORDER — FENTANYL CITRATE (PF) 100 MCG/2ML IJ SOLN
INTRAMUSCULAR | Status: DC | PRN
  Administered 2018-12-13 (×2): 25 ug via INTRAVENOUS

## 2018-12-13 MED ORDER — SODIUM CHLORIDE 0.9 % WEIGHT BASED INFUSION
1.0000 mL/kg/h | INTRAVENOUS | Status: DC
  Administered 2018-12-13: 1 mL/kg/h via INTRAVENOUS

## 2018-12-13 MED ORDER — HYDRALAZINE HCL 20 MG/ML IJ SOLN: 5.0000 mg | INTRAMUSCULAR | Status: AC | PRN

## 2018-12-13 MED ORDER — HEPARIN (PORCINE) 25000 UT/250ML-% IV SOLN
1000.0000 [IU]/h | INTRAVENOUS | Status: DC
  Administered 2018-12-13: 1000 [IU]/h via INTRAVENOUS
  Filled 2018-12-13: qty 250

## 2018-12-13 MED ORDER — ASPIRIN EC 81 MG PO TBEC
81.0000 mg | DELAYED_RELEASE_TABLET | Freq: Every day | ORAL | Status: DC
  Filled 2018-12-13: qty 1

## 2018-12-13 MED ORDER — HEPARIN SODIUM (PORCINE) 1000 UNIT/ML IJ SOLN
INTRAMUSCULAR | Status: AC
  Filled 2018-12-13: qty 1

## 2018-12-13 MED ORDER — ONDANSETRON HCL 4 MG/2ML IJ SOLN: 4.0000 mg | Freq: Four times a day (QID) | INTRAMUSCULAR | Status: DC | PRN

## 2018-12-13 MED ORDER — ASPIRIN 81 MG PO CHEW
324.0000 mg | CHEWABLE_TABLET | Freq: Once | ORAL | Status: AC
  Administered 2018-12-13: 324 mg via ORAL
  Filled 2018-12-13: qty 4

## 2018-12-13 MED ORDER — LABETALOL HCL 5 MG/ML IV SOLN: 10.0000 mg | INTRAVENOUS | Status: AC | PRN

## 2018-12-13 MED ORDER — MIDAZOLAM HCL 2 MG/2ML IJ SOLN
INTRAMUSCULAR | Status: DC | PRN
  Administered 2018-12-13: 2 mg via INTRAVENOUS
  Administered 2018-12-13: 1 mg via INTRAVENOUS

## 2018-12-13 MED ORDER — VERAPAMIL HCL 2.5 MG/ML IV SOLN
INTRAVENOUS | Status: AC
  Filled 2018-12-13: qty 2

## 2018-12-13 MED ORDER — CLOPIDOGREL BISULFATE 300 MG PO TABS
ORAL_TABLET | ORAL | Status: DC | PRN
  Administered 2018-12-13: 600 mg via ORAL

## 2018-12-13 MED ORDER — NITROGLYCERIN 1 MG/10 ML FOR IR/CATH LAB
INTRA_ARTERIAL | Status: DC | PRN
  Administered 2018-12-13 (×2): 150 ug via INTRACORONARY

## 2018-12-13 MED ORDER — NITROGLYCERIN 0.4 MG SL SUBL: 0.4000 mg | SUBLINGUAL_TABLET | SUBLINGUAL | Status: DC | PRN

## 2018-12-13 MED ORDER — APIXABAN 5 MG PO TABS
5.0000 mg | ORAL_TABLET | Freq: Two times a day (BID) | ORAL | Status: DC
  Filled 2018-12-13: qty 1

## 2018-12-13 MED ORDER — ACETAMINOPHEN 325 MG PO TABS: 650.0000 mg | ORAL_TABLET | ORAL | Status: DC | PRN

## 2018-12-13 MED ORDER — CLOPIDOGREL BISULFATE 300 MG PO TABS
ORAL_TABLET | ORAL | Status: AC
  Filled 2018-12-13: qty 2

## 2018-12-13 MED ORDER — SODIUM CHLORIDE 0.9% FLUSH
3.0000 mL | Freq: Two times a day (BID) | INTRAVENOUS | Status: DC
  Administered 2018-12-14: 04:00:00 3 mL via INTRAVENOUS

## 2018-12-13 MED ORDER — METOPROLOL TARTRATE 12.5 MG HALF TABLET
12.5000 mg | ORAL_TABLET | Freq: Two times a day (BID) | ORAL | Status: DC
  Administered 2018-12-13 (×2): 12.5 mg via ORAL
  Filled 2018-12-13 (×4): qty 1

## 2018-12-13 MED ORDER — PROPRANOLOL HCL 10 MG PO TABS
10.0000 mg | ORAL_TABLET | Freq: Three times a day (TID) | ORAL | Status: DC | PRN
  Filled 2018-12-13: qty 1

## 2018-12-13 MED ORDER — IOHEXOL 350 MG/ML SOLN
INTRAVENOUS | Status: DC | PRN
  Administered 2018-12-13: 150 mL via INTRA_ARTERIAL

## 2018-12-13 SURGICAL SUPPLY — 18 items
BALLN SAPPHIRE 2.0X12 (BALLOONS) ×2
BALLN SAPPHIRE ~~LOC~~ 3.0X15 (BALLOONS) ×1 IMPLANT
BALLN SAPPHIRE ~~LOC~~ 3.0X8 (BALLOONS) ×1 IMPLANT
BALLOON SAPPHIRE 2.0X12 (BALLOONS) IMPLANT
CATH 5FR JL3.5 JR4 ANG PIG MP (CATHETERS) ×1 IMPLANT
CATH LAUNCHER 6FR EBU3.5 (CATHETERS) ×1 IMPLANT
DEVICE RAD COMP TR BAND LRG (VASCULAR PRODUCTS) ×1 IMPLANT
GLIDESHEATH SLEND SS 6F .021 (SHEATH) ×1 IMPLANT
GUIDEWIRE INQWIRE 1.5J.035X260 (WIRE) IMPLANT
INQWIRE 1.5J .035X260CM (WIRE) ×2
KIT ENCORE 26 ADVANTAGE (KITS) ×1 IMPLANT
KIT HEART LEFT (KITS) ×2 IMPLANT
PACK CARDIAC CATHETERIZATION (CUSTOM PROCEDURE TRAY) ×2 IMPLANT
STENT RESOLUTE ONYX 2.5X26 (Permanent Stent) ×1 IMPLANT
SYR MEDRAD MARK 7 150ML (SYRINGE) ×2 IMPLANT
TRANSDUCER W/STOPCOCK (MISCELLANEOUS) ×2 IMPLANT
TUBING CIL FLEX 10 FLL-RA (TUBING) ×2 IMPLANT
WIRE COUGAR XT STRL 190CM (WIRE) ×1 IMPLANT

## 2018-12-13 NOTE — Progress Notes (Signed)
ANTICOAGULATION CONSULT NOTE - Initial Consult  Pharmacy Consult for heparin Indication: chest pain/ACS  Allergies  Allergen Reactions  . Ceclor [Cefaclor] Hives  . Chlorhexidine Itching and Rash  . Statins Other (See Comments)    Muscle weakness Other reaction(s): Other (See Comments) Muscle weakness Muscle weakness   . Ace Inhibitors Other (See Comments)    Does not take due to "angiotensin is responsible for maintaining bladder sphincter tone"    Patient Measurements: Height: 6' (182.9 cm) Weight: 185 lb (83.9 kg) IBW/kg (Calculated) : 77.6  Vital Signs: Temp: 98.1 F (36.7 C) (01/27 2003) Temp Source: Oral (01/27 2003) BP: 173/104 (01/28 0520) Pulse Rate: 65 (01/28 0522)  Labs: Recent Labs    12/12/18 2017  HGB 13.4  HCT 40.8  PLT 162  CREATININE 1.28*    Estimated Creatinine Clearance: 55.6 mL/min (A) (by C-G formula based on SCr of 1.28 mg/dL (H)).   Medical History: Past Medical History:  Diagnosis Date  . Atrial flutter -atypical 2007   ABLATION x2 [facility]  . Atrial tachycardia ([place]) 2007   ABLATION [facility]  . Benign paroxysmal vertigo   . CAD (coronary artery disease)    a. abnl mv 01/2012;  b. cath 05/23/2012 LCX 80@ bifurcation w/ OM, 95% @ AV groove, otw nonobs dzs, EF 55-65%;  . Carcinoma ([place])    skin cancer "off my chest"  . Familial tremor   . First degree AV block    had 4 ablasions that left py with heart block  . History of blood transfusion 2010   "2nd day after prostatectomy"  . Hyperlipidemia   . Hypertension   . Incontinence    DECREASING INCONTINENCE RELATED TO PROSTATECTOMY.  . Migraines   . Presence of permanent cardiac pacemaker 2013 ?  Marland Kitchen Prostate cancer ([place])   . Pulsatile tinnitus   . Tachy-brady syndrome ([place])    a. developed after RFCA @ [facility] 2013-> MDT PPM;  b. s/p RA lead revision 05/2012    Assessment: 73yo male c/o CP w/ mild exertion, sent to ED by cards, initial troponin negative, to begin heparin.  Goal of  Therapy:  Heparin level 0.3-0.7 units/ml Monitor platelets by anticoagulation protocol: Yes   Plan:  Will give heparin 4000 units IV bolus x1 followed by gtt at 1000 units/hr and monitor heparin levels and CBC.  Wynona Neat, PharmD, BCPS  12/13/2018,6:00 AM

## 2018-12-13 NOTE — ED Notes (Signed)
Patient NPO at this time no Diet was ordered for Lunch.

## 2018-12-13 NOTE — ED Notes (Signed)
Transported patient to cath lab.

## 2018-12-13 NOTE — H&P (Signed)
Cardiology Admission History and Physical:   Patient ID: Walter Mt, [name] MRN: [id number]; DOB: [birth year]   Admission date: 12/12/2018  Primary Care Provider: [name], [name] Primary Cardiologist: [name], [name]  Primary Electrophysiologist:  Virl Axe, [name]  Chief Complaint:  Chest pain  Patient Profile:   Walter Mt, [name] is a 73 y.o. 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 Burt Knack for CAD and Dr. Caryl Comes for PPM and Afib. He was previously maintained on ASA and xarelto. However, recent phone notes indicate he developed a rash and he thought it may be due to ASA. He has a history of hyponatremia on thiazides. His previous heart cath in 2018 revealed 95% stenosis of the proximal LAD treated with DES. He was on triple therapy for 1 month in the setting of NOAC for Afib. After 6 months, plavix was changed to ASA and he continued Xarelto-that is now been changed to Eliquis. He is statin intolerant and developed rash on zetia. [facility]    [facility] to [facility] with progressive exacerbation of exertional chest discomfort over the last 3 to 4 days.  He initially did not pay much attention to these episodes, however over the last 2 days each time he walked on the treadmill, he started developing his left upper chest discomfort had a earlier timeframe that lasted longer.  This is very very consistent with a similar symptoms he had at the time of his LAD stent. He is noted some mild lower extremity edema but nothing significant.  Otherwise no PND, orthopnea.  No rapid irregular heartbeats palpitations and symptoms.  He appears to be mostly sequentially paced.   No melena,  hematochezia, hematuria or epistaxis. No TIA or amaurosis fugax symptoms.  No syncope/near syncope.   Past Medical History:  Diagnosis Date  . Atrial flutter -atypical 2007   ABLATION x2 [facility]  . Atrial tachycardia ([place]) 2007   ABLATION [facility]  . Benign paroxysmal vertigo   . CAD (coronary artery disease)    a. abnl mv 01/2012;  b. cath 05/23/2012 LCX 80@ bifurcation w/ OM, 95% @ AV groove, otw nonobs dzs, EF 55-65%;  . Carcinoma ([place])    skin cancer "off my chest"  . Familial tremor   . First degree AV block    had 4 ablasions that left py with heart block  . History of blood transfusion 2010   "2nd day after prostatectomy"  . Hyperlipidemia   . Hypertension   . Incontinence    DECREASING INCONTINENCE RELATED TO PROSTATECTOMY.  . Migraines   . Presence of permanent cardiac pacemaker 2013 ?  Marland Kitchen Prostate cancer ([place])   . Pulsatile tinnitus   . Tachy-brady syndrome ([place])    a. developed after RFCA @ [facility] 2013-> MDT PPM;  b. s/p RA lead revision 05/2012    Past Surgical History:  Procedure Laterality Date  . ATRIAL ABLATION SURGERY  04/01/12   "4th time I had one"  . CARDIAC CATHETERIZATION    . CARDIOVERSION  12/03/2011   Procedure: CARDIOVERSION;  Surgeon: [name], [name];  Location: [place];  Service: Cardiovascular;  Laterality: [name];  . CARDIOVERSION  01/13/2012  Procedure: CARDIOVERSION;  Surgeon: Lelon Perla, [name];  Location: [place];  Service: Cardiovascular;  Laterality: [name];  . CARDIOVERSION  04/27/2012   Procedure: CARDIOVERSION;  Surgeon: Darlin Coco, [name];  Location: [place];  Service: Cardiovascular;  Laterality: [name];  . COLONOSCOPY    . COLONOSCOPY WITH PROPOFOL [name] 10/29/2016   Procedure: COLONOSCOPY WITH PROPOFOL;  Surgeon: Doran Stabler, [name];  Location: [facility] ENDOSCOPY;  Service: Gastroenterology;  Laterality: [name];  . CORONARY ANGIOPLASTY WITH STENT PLACEMENT  06/02/12   "1; first one ever"  . CORONARY STENT INTERVENTION [name] 07/21/2017   Procedure: CORONARY STENT  INTERVENTION;  Surgeon: [name], [name];  Location: [place] CV LAB;  Service: Cardiovascular;  Laterality: [name];  . EP IMPLANTABLE DEVICE [name] 06/17/2016   Procedure: BiV Pacemaker upgrade;  Surgeon: [name], [name];  Location: [place] CV LAB;  Service: Cardiovascular;  Laterality: [name];  . HERNIA REPAIR  1980's  . INGUINAL HERNIA REPAIR  1945   left side  . INSERT / REPLACE / REMOVE PACEMAKER  03/2012   initial placement  . INSERT / REPLACE / REMOVE PACEMAKER  05/25/12   "atrial lead change"  . LEAD REVISION [name] 05/25/2012   Procedure: LEAD REVISION;  Surgeon: Evans Lance, [name];  Location: [facility] CATH LAB;  Service: Cardiovascular;  Laterality: [name];  . LEFT HEART CATH AND CORONARY ANGIOGRAPHY [name] 07/21/2017   Procedure: LEFT HEART CATH AND CORONARY ANGIOGRAPHY;  Surgeon: [name], [name];  Location: [place] CV LAB;  Service: Cardiovascular;  Laterality: [name];  . PERCUTANEOUS CORONARY STENT INTERVENTION (PCI-S) [name] 06/02/2012   Procedure: PERCUTANEOUS CORONARY STENT INTERVENTION (PCI-S);  Surgeon: Hillary Bow, [name];  Location: [facility] CATH LAB;  Service: Cardiovascular;  Laterality: [name];  . PROSTATECTOMY  07/22/1883   COMPLICATED  . SHOULDER SURGERY     Bilateral; "boney spurs removed"  . SKIN CANCER EXCISION     "in situ; right chest"  . TONSILLECTOMY AND ADENOIDECTOMY     "as a child"     Medications Prior to Admission: Prior to Admission medications   Medication Sig Start Date End Date Taking? Authorizing Provider  acyclovir (ZOVIRAX) 400 MG tablet Take 400 mg as directed by mouth. Herpes flare on legs 05/11/16  Yes Provider, Historical, [name]  apixaban (ELIQUIS) 5 MG TABS tablet Take 1 tablet (5 mg total) by mouth 2 (two) times daily. 11/15/18  Yes [name], [name]  aspirin EC 81 MG tablet Take 1 tablet (81 mg total) by mouth daily. 12/30/17  Yes [name], [name]  nitroGLYCERIN (NITROSTAT) 0.4 MG SL tablet Place 0.4 mg under the tongue every 5 (five) minutes as needed for  chest pain (x 3 tabs).   Yes Provider, Historical, [name]  propranolol (INDERAL) 10 MG tablet Take 10 mg by mouth 3 (three) times daily as needed. Palpitations or benign central tremor    Yes Provider, Historical, [name]  spironolactone (ALDACTONE) 25 MG tablet Take 1 tablet (25 mg total) by mouth daily. 11/15/18 02/13/19 Yes [name], [name]     Allergies:    Allergies  Allergen Reactions  . Ceclor [Cefaclor] Hives  . Chlorhexidine Itching and Rash  . Statins Other (See Comments)    Muscle weakness Other reaction(s): Other (See Comments) Muscle weakness Muscle weakness   . Ace Inhibitors Other (See Comments)    Does not take due to "angiotensin is responsible for maintaining bladder sphincter tone"    Social History:   Social History   Social History Narrative  Retired ENT   Social History   Tobacco Use  . Smoking status: Never Smoker  . Smokeless tobacco: Never Used  Substance Use Topics  . Alcohol use: Yes    Alcohol/week: 3.0 standard drinks    Types: 3 Standard drinks or equivalent per week    Comment: drinks alcohol at least once every 3 months 05/25/12 "last alcohol 02/2012"  . Drug use: No     Family History:   The patient's family history includes Colon cancer in his sister; Diabetes in his brother; Heart disease in his brother and father.    ROS: Please see the history of present illness.  Review of Systems  Constitutional: Negative for malaise/fatigue.  HENT: Negative for congestion and nosebleeds.   Respiratory: Negative.   Cardiovascular: Positive for chest pain and leg swelling (mild). Negative for palpitations.  Gastrointestinal: Negative for abdominal pain and heartburn.  Skin: Positive for rash (Still off and on rash.  But not currently significant).  Neurological: Negative for dizziness, focal weakness and weakness.  Psychiatric/Behavioral: Negative.   All other systems reviewed and are negative.     Physical Exam/Data:   Vitals:   12/13/18 0615  12/13/18 0630 12/13/18 0645 12/13/18 0700  BP: (!) 131/92 (!) 123/95 (!) 130/91 (!) 134/98  Pulse: 72 69 70 69  Resp: 19 14 17 20   Temp:      TempSrc:      SpO2: 100% 99% 100% 98%  Weight:      Height:       No intake or output data in the 24 hours ending 12/13/18 0901 Last 3 Weights 12/12/2018 09/29/2018 06/21/2018  Weight (lbs) 185 lb 186 lb 12.8 oz 186 lb  Weight (kg) 83.915 kg 84.732 kg 84.369 kg     Body mass index is 25.09 kg/m.  General:  Well nourished, well developed, in no acute distress.  Resting comfortably in bed HEENT: [place]/AT.  EOMI.  MMM. Lymph: no adenopathy Neck: noJVD or carotid bruit. Endocrine:  No thryomegaly Vascular: No carotid bruits; FA pulses 2+ bilaterally without bruits  Cardiac: Normal rate.  Normal S1, S2; RRR; no M/R/G Lungs:  clear to auscultation bilaterally, no wheezing, rhonchi or rales  Abd: soft, nontender, no hepatomegaly  Ext: no clubbing, cyanosis or edema Musculoskeletal:  No deformities, BUE and BLE strength normal and equal Skin: warm and dry  Neuro:  CNs 2-12 intact, no focal abnormalities noted Psych:  Normal affect     EKG:  The ECG that was done was personally reviewed and demonstrates paced rhythm  Relevant CV Studies:  Left heart cath 07/2017 1. Severe single-vessel coronary artery disease with critical stenosis of the proximal LAD, treated successfully with PCI using a 3.5 x 15 mm Xience Sierra drug-eluting stent 2. Moderate residual coronary artery disease involving the mid right coronary artery and mid LAD 3. 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 Normal LV systolic function (improved from previous)  Recommendations: The patient is chronically anticoagulated with Xarelto for atrial fibrillation. This can be restarted tomorrow. Recommend triple therapy with aspirin 81 mg, Plavix 75 mg, and Xarelto 20 mg for a  period of one month. After that time I would recommend discontinuing aspirin and remaining on a combination of Plavix and Xarelto for total of 6 months from the time of this PCI procedure.  Laboratory Data:  Chemistry Recent Labs  Lab 12/12/18 2017  NA  136  K 4.5  CL 101  CO2 26  GLUCOSE 87  BUN 14  CREATININE 1.28*  CALCIUM 9.2  GFRNONAA 55*  GFRAA >60  ANIONGAP 9    No results for input(s): PROT, ALBUMIN, AST, ALT, ALKPHOS, BILITOT in the last 168 hours. Hematology Recent Labs  Lab 12/12/18 2017  WBC 5.6  RBC 4.46  HGB 13.4  HCT 40.8  MCV 91.5  MCH 30.0  MCHC 32.8  RDW 12.5  PLT 162   Cardiac EnzymesNo results for input(s): TROPONINI in the last 168 hours.  Recent Labs  Lab 12/12/18 2027  TROPIPOC 0.01    BNPNo results for input(s): BNP, PROBNP in the last 168 hours.  DDimer No results for input(s): DDIMER in the last 168 hours.  Radiology/Studies:  Dg Chest 2 View  Result Date: 12/12/2018 CLINICAL DATA:  Chest pain with exertion for the past 3 days. EXAM: CHEST - 2 VIEW COMPARISON:  Chest x-ray dated June 18, 2016. FINDINGS: Stable mild cardiomegaly. Unchanged left chest wall AICD. Normal pulmonary vascularity. No focal consolidation, pleural effusion, or pneumothorax. Chronic blunting of the left costophrenic angle. No acute osseous abnormality. IMPRESSION: No active cardiopulmonary disease. Electronically Signed   By: Titus Dubin M.D.   On: 12/12/2018 20:27    Assessment and Plan:   1.  Progressive exertional chest pain consistent with progressive/unstable angina.  He does have a significant hx of CAD with LAD and circumflex stents.- pt states he developed chest pain yesterday and was advised to come to the ER - initial troponin negative (not unexpected) - EKG with paced rhythm - on arrival, initial troponin was negative - sCr 1.28, baseline is 1.05-1.18 --Would suggest relook cardiac catheterization today.  Anticipate that it would not be to later  on this afternoon which would be appropriate since his last Eliquis dose was yesterday evening.   2. HLD - intolerant to statins and zetia - [facility]   3. Hx of heart block, tachy-brady, PPM in place - EKG with paced rhythm - maintained on Eliquis -last dose 1800 yesterday.   Plan for now would be to admit for progressive angina with plan cardiac catheterization.  Is on IV heparin drip.  Has not been on Plavix as he has been on aspirin plus Eliquis.    Severity of Illness: The appropriate patient status for this patient is OBSERVATION. Observation status is judged to be reasonable and necessary in order to provide the required intensity of service to ensure the patient's safety. The patient's presenting symptoms, physical exam findings, and initial radiographic and laboratory data in the context of their medical condition is felt to place them at decreased risk for further clinical deterioration. Furthermore, it is anticipated that the patient will be medically stable for discharge from the hospital within 2 midnights of admission. The following factors support the patient status of observation.   " The patient's presenting symptoms include progressively worsening exertional chest pain " The physical exam findings include no worrisome exam findings. " The initial radiographic and laboratory data are relatively stable.  Has ruled out for MI.     For questions or updates, please contact [facility] [place] Please consult [email or web address] for contact info under        Signed, Glenetta Hew, M.D., M.S. Interventional Cardiologist   Pager # [phone] Phone # [phone] [address] [place], [place] [address]

## 2018-12-13 NOTE — ED Provider Notes (Addendum)
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  Pain has been happening for the past 3 to 4 days and described as an upper burning, pressure pain.  States worse with exertion and states that has been getting progressively worse.  States that he was on the treadmill and symptoms started within several seconds.  Has had associated shortness of breath as well.  No symptoms currently.  States he called cardiology who instructed him to come the emergency department.  Last cardiac catheterization was August 2018.  No history of PE or DVT.  ROS: See HPI Constitutional: no fever  Eyes: no drainage  ENT: no runny nose   Cardiovascular:   chest pain  Resp:  SOB  GI: no vomiting GU: no dysuria Integumentary: no rash  Allergy: no hives  Musculoskeletal: no leg swelling  Neurological: no slurred speech ROS otherwise negative  PAST MEDICAL HISTORY/PAST SURGICAL HISTORY:  Past Medical History:  Diagnosis Date  . Atrial flutter -atypical 2007   ABLATION x2 [facility]  . Atrial tachycardia ([place]) 2007   ABLATION [facility]  . Benign paroxysmal vertigo   . CAD (coronary artery disease)    a. abnl mv 01/2012;  b. cath 05/23/2012 LCX 80@ bifurcation w/ OM, 95% @ AV groove, otw nonobs dzs, EF 55-65%;  . Carcinoma ([place])    skin cancer "off my chest"  . Familial tremor   . First degree AV block    had 4 ablasions that left py with heart block  . History of blood transfusion 2010   "2nd day after prostatectomy"  . Hyperlipidemia   . Hypertension   . Incontinence    DECREASING INCONTINENCE RELATED TO PROSTATECTOMY.  . Migraines   . Presence of permanent cardiac pacemaker 2013 ?  Marland Kitchen Prostate cancer ([place])   . Pulsatile tinnitus   . Tachy-brady  syndrome ([place])    a. developed after RFCA @ [facility] 2013-> MDT PPM;  b. s/p RA lead revision 05/2012    MEDICATIONS:  Prior to Admission medications   Medication Sig Start Date End Date Taking? Authorizing Provider  acyclovir (ZOVIRAX) 400 MG tablet Take 400 mg as directed by mouth. Herpes flare on legs 05/11/16   [provider]  apixaban (ELIQUIS) 5 MG TABS tablet Take 1 tablet (5 mg total) by mouth 2 (two) times daily. 11/15/18   [name], MD  aspirin EC 81 MG tablet Take 1 tablet (81 mg total) by mouth daily. 12/30/17   [name], MD  cholecalciferol (VITAMIN D) 1000 UNITS tablet Take 1,000 Units by mouth daily.    [provider]  nitroGLYCERIN (NITROSTAT) 0.4 MG SL tablet Place 0.4 mg under the tongue every 5 (five) minutes as needed for chest pain (x 3 tabs).    [provider]  propranolol (INDERAL) 10 MG tablet Take 10 mg by mouth 3 (three) times daily as needed. Palpitations or benign central tremor     [provider]  spironolactone (ALDACTONE) 25 MG tablet Take 1 tablet (25 mg total) by mouth daily. 11/15/18 02/13/19  [name], MD  vitamin B-12 (CYANOCOBALAMIN) 1000 MCG tablet Take 1,000 mcg by mouth daily.    [provider]    ALLERGIES:  Allergies  Allergen Reactions  . Ceclor [Cefaclor] Hives  .  Chlorhexidine Itching and Rash  . Statins Other (See Comments)    Muscle weakness Other reaction(s): Other (See Comments) Muscle weakness Muscle weakness   . Ace Inhibitors Other (See Comments)    Does not take due to "angiotensin is responsible for maintaining bladder sphincter tone"    SOCIAL HISTORY:  Social History   Tobacco Use  . Smoking status: Never Smoker  . Smokeless tobacco: Never Used  Substance Use Topics  . Alcohol use: Yes    Alcohol/week: 3.0 standard drinks    Types: 3 Standard drinks or equivalent per week    Comment: drinks alcohol at least once every 3 months 05/25/12 "last alcohol 02/2012"     FAMILY HISTORY: Family History  Problem Relation Age of Onset  . Colon cancer Sister   . Heart disease Father   . Heart disease Brother        only one brother  . Diabetes Brother        only one brother    EXAM: BP (!) 173/104   Pulse 65   Temp 98.1 F (36.7 C) (Oral)   Resp 12   Ht 6' (1.829 m)   Wt 83.9 kg   SpO2 100%   BMI 25.09 kg/m  CONSTITUTIONAL: Alert and oriented and responds appropriately to questions. Well-appearing; well-nourished elderly, in no distress HEAD: Normocephalic EYES: Conjunctivae clear, pupils appear equal, EOMI ENT: normal nose; moist mucous membranes NECK: Supple, no meningismus, no nuchal rigidity, no LAD  CARD: RRR; S1 and S2 appreciated; no murmurs, no clicks, no rubs, no gallops RESP: Normal chest excursion without splinting or tachypnea; breath sounds clear and equal bilaterally; no wheezes, no rhonchi, no rales, no hypoxia or respiratory distress, speaking full sentences ABD/GI: Normal bowel sounds; non-distended; soft, non-tender, no rebound, no guarding, no peritoneal signs, no hepatosplenomegaly BACK:  The back appears normal and is non-tender to palpation, there is no CVA tenderness EXT: Normal ROM in all joints; non-tender to palpation; no edema; normal capillary refill; no cyanosis, no calf tenderness or swelling    SKIN: Normal color for age and race; warm; no rash NEURO: Moves all extremities equally PSYCH: The patient's mood and manner are appropriate. Grooming and personal hygiene are appropriate.  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  Called cardiology on-call who recommended he come to the ED.  Currently asymptomatic.  First troponin negative.  EKG shows no ischemia, paced rhythm only.  Chest x-ray clear.  Does not appear volume overloaded.  Will discuss with cardiology for admission.  Will give aspirin, heparin.  Pacemaker Report: device functioning  appropriately, shows atrial arrhythmias, most recently on Jan 21st, possible PACs.  ED PROGRESS:   5:54 AM Discussed patient's case with cardiology fellow, Dr. Marletta Lor.  I have recommended admission and patient (and family if present) agree with this plan. Admitting physician will place admission orders.  Cardiology agrees to admit patient.  They state patient will be seen by daytime team.  Radiology request that we keep patient n.p.o.  I reviewed all nursing notes, vitals, pertinent previous records, EKGs, lab and urine results, imaging (as available).     EKG Interpretation  Date/Time:  Monday December 12 2018 20:07:00 EST Ventricular Rate:  74 PR Interval:  148 QRS Duration: 140 QT Interval:  426 QTC Calculation: 472 R Axis:   120 Text Interpretation:  Atrial-sensed ventricular-paced rhythm No significant change since last tracing Confirmed by Ward, Cyril Mourning [phone]) on 12/13/2018 5:29:28 AM  CRITICAL CARE Performed by: Pryor Curia   Total critical care time: 45 minutes  Critical care time was exclusive of separately billable procedures and treating other patients.  Critical care was necessary to treat or prevent imminent or life-threatening deterioration.  Critical care was time spent personally by me on the following activities: development of treatment plan with patient and/or surrogate as well as nursing, discussions with consultants, evaluation of patient's response to treatment, examination of patient, obtaining history from patient or surrogate, ordering and performing treatments and interventions, ordering and review of laboratory studies, ordering and review of radiographic studies, pulse oximetry and re-evaluation of patient's condition.     Ward, Delice Bison, DO 12/13/18 Dillard, Delice Bison, DO 12/13/18 0559    Ward, Delice Bison, DO 12/13/18 [phone]

## 2018-12-13 NOTE — ED Notes (Signed)
Pt complaining of chest pain that began yesterday afternoon while walking on the treading mill. Currently not having any chest pain. Pt is hypertensive

## 2018-12-13 NOTE — ED Notes (Signed)
Walked patient to the bathroom  

## 2018-12-13 NOTE — Interval H&P Note (Signed)
Cath Lab Visit (complete for each Cath Lab visit)  Clinical Evaluation Leading to the Procedure:   ACS: Yes.    Non-ACS:    Anginal Classification: CCS III  Anti-ischemic medical therapy: Minimal Therapy (1 class of medications)  Non-Invasive Test Results: No non-invasive testing performed  Prior CABG: No previous CABG      History and Physical Interval Note:  12/13/2018 2:49 PM  [name], MD  has presented today for surgery, with the diagnosis of cp  The various methods of treatment have been discussed with the patient and family. After consideration of risks, benefits and other options for treatment, the patient has consented to  Procedure(s): LEFT HEART CATH AND CORONARY ANGIOGRAPHY ([name]) as a surgical intervention .  The patient's history has been reviewed, patient examined, no change in status, stable for surgery.  I have reviewed the patient's chart and labs.  Questions were answered to the patient's satisfaction.     [name]

## 2018-12-14 ENCOUNTER — Inpatient Hospital Stay (HOSPITAL_COMMUNITY): Payer: Medicare Other

## 2018-12-14 ENCOUNTER — Telehealth: Payer: Self-pay | Admitting: Cardiovascular Disease

## 2018-12-14 ENCOUNTER — Encounter (HOSPITAL_COMMUNITY): Payer: Self-pay | Admitting: Cardiovascular Disease

## 2018-12-14 DIAGNOSIS — I370 Nonrheumatic pulmonary valve stenosis: Secondary | ICD-10-CM

## 2018-12-14 DIAGNOSIS — I10 Essential (primary) hypertension: Secondary | ICD-10-CM

## 2018-12-14 DIAGNOSIS — I361 Nonrheumatic tricuspid (valve) insufficiency: Secondary | ICD-10-CM

## 2018-12-14 LAB — ECHOCARDIOGRAM COMPLETE
HEIGHTINCHES: 72 in
Weight: 2927.71 oz

## 2018-12-14 LAB — CBC
HCT: 36.6 % — ABNORMAL LOW (ref 39.0–52.0)
HEMOGLOBIN: 12.5 g/dL — AB (ref 13.0–17.0)
MCH: 30.5 pg (ref 26.0–34.0)
MCHC: 34.2 g/dL (ref 30.0–36.0)
MCV: 89.3 fL (ref 80.0–100.0)
Platelets: 135 10*3/uL — ABNORMAL LOW (ref 150–400)
RBC: 4.1 MIL/uL — ABNORMAL LOW (ref 4.22–5.81)
RDW: 12.3 % (ref 11.5–15.5)
WBC: 5.1 10*3/uL (ref 4.0–10.5)
nRBC: 0 % (ref 0.0–0.2)

## 2018-12-14 LAB — BASIC METABOLIC PANEL
Anion gap: 11 (ref 5–15)
BUN: 17 mg/dL (ref 8–23)
CO2: 21 mmol/L — ABNORMAL LOW (ref 22–32)
Calcium: 8.6 mg/dL — ABNORMAL LOW (ref 8.9–10.3)
Chloride: 103 mmol/L (ref 98–111)
Creatinine, Ser: 0.9 mg/dL (ref 0.61–1.24)
GFR calc Af Amer: >60 mL/min (ref >60)
GFR calc non Af Amer: >60 mL/min (ref >60)
Glucose, Bld: 100 mg/dL — ABNORMAL HIGH (ref 70–99)
Potassium: 4.3 mmol/L (ref 3.5–5.1)
Sodium: 135 mmol/L (ref 135–145)

## 2018-12-14 MED ORDER — CLOPIDOGREL BISULFATE 75 MG PO TABS: 75.0000 mg | ORAL_TABLET | Freq: Every day | ORAL | 4 refills | Status: DC

## 2018-12-14 MED ORDER — METOPROLOL TARTRATE 25 MG PO TABS: 12.5000 mg | ORAL_TABLET | Freq: Two times a day (BID) | ORAL | 4 refills | Status: DC

## 2018-12-14 MED FILL — Fentanyl Citrate Preservative Free (PF) Inj 100 MCG/2ML: INTRAMUSCULAR | Qty: 2 | Status: AC

## 2018-12-14 NOTE — Progress Notes (Signed)
  Echocardiogram 2D Echocardiogram has been performed.  Jennette Dubin 12/14/2018, 10:26 AM

## 2018-12-14 NOTE — Telephone Encounter (Signed)
I will route TOC to Triage as Jeani Hawking Via, LPN is out of the office

## 2018-12-14 NOTE — Telephone Encounter (Signed)
New message      toc appt per jill PA 2/12 at 8am with Phylliss Bob

## 2018-12-14 NOTE — Progress Notes (Signed)
CARDIAC REHAB PHASE I   PRE:  Rate/Rhythm: 71 pacing    BP: sitting 128/96    SaO2:   MODE:  Ambulation: 800 ft   POST:  Rate/Rhythm: 95 pacing    BP: sitting 137/81     SaO2:   Tolerated very well, no c/o. Ed completed/reviewed. Understands importance of Plavix. Will refer to [place], pt is thinking about doing program. Declined diet as he is very versed on it from [place]. Walks almost daily. Reviewed NTG. [id number]   Darrick Meigs CES, ACSM 12/14/2018 8:45 AM

## 2018-12-14 NOTE — Discharge Summary (Signed)
Discharge Summary    Patient ID: Walter CLINKSCALE, MD MRN: [id number]; DOB: 1946/06/19  Admit date: 12/12/2018 Discharge date: 12/14/2018  Primary Care Provider: [name], MD  Primary Cardiologist: Walter Mocha, MD  Primary Electrophysiologist:  Walter Axe, MD   Discharge Diagnoses    Principal Problem:   Unstable angina [facility]) Active Problems:   Hyperlipidemia   Atrial flutter/fibrillation   Hypertension   Pacemaker-[employer]   CAD (coronary artery disease)  Allergies Allergies  Allergen Reactions  . Ceclor [Cefaclor] Hives  . Chlorhexidine Itching and Rash  . Statins Other (See Comments)    Muscle weakness Other reaction(s): Other (See Comments) Muscle weakness Muscle weakness   . Ace Inhibitors Other (See Comments)    Does not take due to "angiotensin is responsible for maintaining bladder sphincter tone"   Diagnostic Studies/Procedures    Cardiac catheterization 12/13/2018:   Previously placed Prox LAD drug eluting stent is widely patent.  Balloon angioplasty was performed.  Mid LAD to Dist LAD lesion is 95% stenosed.  Dist LAD lesion is 50% stenosed.  Prox Cx to Mid Cx lesion is 10% stenosed.  Dist Cx lesion is 100% stenosed.  Dist RCA lesion is 50% stenosed.  A drug-eluting stent was successfully placed using a STENT RESOLUTE ONYX 2.5X26.  Post intervention, there is a 0% residual stenosis.  There is mild left ventricular systolic dysfunction.  The left ventricular ejection fraction is 50-55% by visual estimate.   1.  Severe mid LAD stenosis treated successfully with PCI using a 2.5 x 26 mm resolute Onyx DES, postdilated to high pressure with a 3.0 mm noncompliant balloon 2.  Continued patency of the stented segment in the proximal LAD with no significant restenosis 3.  Continued patency of the stented segment in the left circumflex with chronic occlusion of the circumflex subbranch collateralized by left to left collaterals 4.  Moderate  stenosis of the distal RCA, unchanged from previous studies, estimated at 50% 5.  Mild global LV systolic dysfunction with LVEF estimated at 50 to 55%  Recommendations: Resume apixaban tomorrow, continue aspirin 81 mg daily x1 month, continue clopidogrel 75 mg daily x6 months.  The patient has had multiple ACS events.  If he is tolerating a combination of apixaban and clopidogrel without significant bleeding problems, it might be reasonable to continue longer than 6 months.   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 Burt [name] for CAD and Dr. Caryl [name] for PPM and Afib. He was previously maintained on ASA and xarelto. However, recent phone notes indicate he developed a rash and he thought it may be due to ASA. He has a history of hyponatremia on thiazides. His previous heart cath in 2018 revealed 95% stenosis of the proximal LAD treated with DES. He was on triple therapy for 1 month in the setting of NOAC for Afib. After 6 months, plavix was changed to ASA and he continued Xarelto-that is now been changed to Eliquis. He is statin intolerant and developed rash on zetia. [facility]    [facility] to [facility] 12/13/2018 with progressive exacerbation of exertional chest discomfort over the last 3  to 4 days. He initially did not pay much attention to these episodes, however over the last 2 days each time he walked on the treadmill, he started developing his left upper chest discomfort had a earlier timeframe that lasted longer, consistent with a similar symptoms he had at the time of his LAD stent. He is noted some mild lower extremity edema but nothing significant.   Otherwise no PND, orthopnea.  No rapid irregular heartbeats palpitations and symptoms.  He appears to be mostly sequentially paced. No melena, hematochezia, hematuria or epistaxis. No TIA or amaurosis fugax symptoms.  No syncope/near syncope.  Given the above, cardiac cath was recommended for further evaluation.   Hospital Course    Patient underwent a cardiac catheterization 12/13/2018 which showed severe mid LAD stenosis which was treated successfully with a PCI/DES.  He had continued patency of the stented segment in the proximal LAD with no significant restenosis.  He had continued patency of the stented segment of the left circumflex with chronic occlusion of the circumflex subbranch collateralized by the left to left collaterals. There was moderate stenosis in the distal RCA, unchanged from previous studies with an estimate at 50%.  He had mid global LV systolic dysfunction with LVEF estimated at 50 to 55%.  Recommendations were to resume apixaban today, 12/14/2018 with continuation of ASA 81 for 1 month,  continue clopidogrel 75 mg daily for 6 months or longer per cath note.  Will obtain echocardiogram prior to discharge. On day of discharge, vital are stable. Lab work within normal range. Has ambulated with cardiac rehab without complication.   Other hospital problems include:  -HLD: -Intolerant to statins and zetia -Planned to discuss [facility] vs [facility]  -Hx of heart block, tachy-brady s/p PPM followed by Dr. Caryl [name]: -EKG with paced rhythm -Maintained on Eliquis>>can resume Eliquis today per cath note   -Left lower leg skin rash: -Followed by OP dermatology>>>has many medication allergies. May need to change metoprolol in the OP setting if continued LE rash    General: Well developed, well nourished, NAD Skin: Warm, dry, intact  Head: Normocephalic, atraumatic, clear, moist mucus membranes. Neck: Negative for carotid bruits. No JVD Lungs:Clear to  ausculation bilaterally. No wheezes, rales, or rhonchi. Breathing is unlabored. Cardiovascular: RRR with S1 S2. No murmurs, rubs, gallops, or LV heave appreciated. Abdomen: Soft, non-tender, non-distended with normoactive bowel sounds. No obvious abdominal masses. MSK: Strength and tone appear normal for age. 5/5 in all extremities Extremities: No edema. No clubbing or cyanosis. DP/PT pulses 2+ bilaterally Neuro: Alert and oriented. No focal deficits. No facial asymmetry. MAE spontaneously. Psych: Responds to questions appropriately with normal affect.    Consultants: None   The patient was seen and examined by Dr. Ellyn Hack who feels that he is stable and ready for discharge today, 12/14/2018 _____________  Discharge Vitals Blood pressure 137/81, pulse 95, temperature 97.7 F (36.5 C), temperature source Oral, resp. rate 18, height 6' (1.829 m), weight 83 kg, SpO2 97 %.  Filed Weights   12/12/18 2003 12/14/18 0304  Weight: 83.9 kg 83 kg   Labs & Radiologic Studies    CBC Recent Labs    12/12/18 2017 12/14/18 0251  WBC 5.6 5.1  HGB 13.4 12.5*  HCT 40.8 36.6*  MCV 91.5 89.3  PLT 162 937*   Basic Metabolic Panel Recent Labs    12/13/18 1046 12/14/18 0251  NA 136 135  K 4.5 4.3  CL 100 103  CO2 27 21*  GLUCOSE 101* 100*  BUN 12 17  CREATININE 0.98 0.90  CALCIUM 9.2 8.6*  _____________  Dg Chest 2 View  Result Date: 12/12/2018 CLINICAL DATA:  Chest pain with exertion for the past 3 days. EXAM: CHEST - 2 VIEW COMPARISON:  Chest x-ray dated June 18, 2016. FINDINGS: Stable mild cardiomegaly. Unchanged left chest wall AICD. Normal pulmonary vascularity. No focal consolidation, pleural effusion, or pneumothorax. Chronic blunting of the left costophrenic angle. No acute osseous abnormality. IMPRESSION: No active cardiopulmonary disease. Electronically Signed   By: Titus Dubin M.D.   On: 12/12/2018 20:27   Disposition   Pt is being discharged home today in good  condition.  Follow-up Plans & Appointments   Follow-up Information    Daune Perch, NP Follow up on 12/28/2018.   Specialty:  Cardiology Why:  Your follow up appointment will be on 12/28/2018 at 0800am. Contact information: [place] [address] [phone]          Discharge Instructions    Amb Referral to Cardiac Rehabilitation   Complete by:  As directed    Diagnosis:   Coronary Stents PTCA     Call MD for:  difficulty breathing, headache or visual disturbances   Complete by:  As directed    Call MD for:  extreme fatigue   Complete by:  As directed    Call MD for:  hives   Complete by:  As directed    Call MD for:  persistant dizziness or light-headedness   Complete by:  As directed    Call MD for:  persistant nausea and vomiting   Complete by:  As directed    Call MD for:  redness, tenderness, or signs of infection (pain, swelling, redness, odor or green/yellow discharge around incision site)   Complete by:  As directed    Call MD for:  severe uncontrolled pain   Complete by:  As directed    Call MD for:  temperature >100.4   Complete by:  As directed    Diet - low sodium heart healthy   Complete by:  As directed    Discharge instructions   Complete by:  As directed    No driving for 3 days. No lifting over 5 lbs for 1 week. No sexual activity for 1 week.  Keep procedure site clean & dry. If you notice increased pain, swelling, bleeding or pus, call/return!  You may shower, but no soaking baths/hot tubs/pools for 1 week.   PLEASE DO NOT MISS ANY DOSES OF YOUR PLAVIX!!!!! Also keep a log of you blood pressures and bring back to your follow up appt. Please call the office with any questions.   Patients taking blood thinners should generally stay away from medicines like ibuprofen, Advil, Motrin, naproxen, and Aleve due to risk of stomach bleeding. You may take Tylenol as directed or talk to your primary doctor about alternatives.  If you  notice any bleeding such as blood in stool, black tarry stools, blood in urine, nosebleeds or any other unusual bleeding, call your doctor immediately. It is not normal to have this kind of bleeding while on a blood thinner and usually indicates there is an underlying problem with one of your body systems that needs to be checked out.   Increase activity slowly   Complete by:  As directed      Discharge Medications   Allergies as of 12/14/2018      Reactions   Ceclor [cefaclor] Hives  Chlorhexidine Itching, Rash   Statins Other (See Comments)   Muscle weakness Other reaction(s): Other (See Comments) Muscle weakness Muscle weakness   Ace Inhibitors Other (See Comments)   Does not take due to "angiotensin is responsible for maintaining bladder sphincter tone"      Medication List    TAKE these medications   acyclovir 400 MG tablet Commonly known as:  ZOVIRAX Take 400 mg as directed by mouth. Herpes flare on legs   apixaban 5 MG Tabs tablet Commonly known as:  ELIQUIS Take 1 tablet (5 mg total) by mouth 2 (two) times daily.   aspirin EC 81 MG tablet Take 1 tablet (81 mg total) by mouth daily.   clopidogrel 75 MG tablet Commonly known as:  PLAVIX Take 1 tablet (75 mg total) by mouth daily with breakfast.   metoprolol tartrate 25 MG tablet Commonly known as:  LOPRESSOR Take 0.5 tablets (12.5 mg total) by mouth 2 (two) times daily.   nitroGLYCERIN 0.4 MG SL tablet Commonly known as:  NITROSTAT Place 0.4 mg under the tongue every 5 (five) minutes as needed for chest pain (x 3 tabs).   propranolol 10 MG tablet Commonly known as:  INDERAL Take 10 mg by mouth 3 (three) times daily as needed. Palpitations or benign central tremor   spironolactone 25 MG tablet Commonly known as:  ALDACTONE Take 1 tablet (25 mg total) by mouth daily.        Acute coronary syndrome (MI, NSTEMI, STEMI, etc) this admission?: Yes.     AHA/ACC Clinical Performance & Quality  Measures: 1. Aspirin prescribed? - Yes 2. ADP Receptor Inhibitor (Plavix/Clopidogrel, Brilinta/Ticagrelor or Effient/Prasugrel) prescribed (includes medically managed patients)? - Yes 3. Beta Blocker prescribed? - Yes 4. High Intensity Statin (Lipitor 40-80mg  or Crestor 20-40mg ) prescribed? -No, intolerant  5. EF assessed during THIS hospitalization? - Yes 6. For EF <40%, was ACEI/ARB prescribed? - Not Applicable (EF >/= 18%) 7. For EF <40%, Aldosterone Antagonist (Spironolactone or Eplerenone) prescribed? - Not Applicable (EF >/= 84%) 8. Cardiac Rehab Phase II ordered (Included Medically managed Patients)? - Yes    Outstanding Labs/Studies   Lipid clinic referral if he did not [facility] for [facility]   Duration of Discharge Encounter   Greater than 30 minutes including physician time.  Signed, Kathyrn Drown, NP 12/14/2018, 9:21 AM

## 2018-12-14 NOTE — Progress Notes (Signed)
TR BAND REMOVAL  LOCATION:    right radial  DEFLATED PER PROTOCOL:    Yes.    TIME BAND OFF / DRESSING APPLIED:    2215   SITE UPON ARRIVAL:    Level 0  SITE AFTER BAND REMOVAL:    Level 0  CIRCULATION SENSATION AND MOVEMENT:    Within Normal Limits   Yes.    COMMENTS:   Post TR band instructions given, DSD applied, good cap. refill.

## 2018-12-15 ENCOUNTER — Telehealth: Payer: Self-pay | Admitting: Cardiovascular Disease

## 2018-12-15 NOTE — Telephone Encounter (Signed)
New Message:    Patient would like for someone to call him. [phone] patient did not tell me what he needed. 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

## 2018-12-15 NOTE — Telephone Encounter (Signed)
Last seen by Dr. Caryl Comes in 09/2018, last seen by Dr. Burt Knack 03/2018. As pt is not giving information about call I will route to Dr. Olin Pia nurse Rolanda Lundborg, RN to f/u with pt.

## 2018-12-15 NOTE — Telephone Encounter (Signed)
I would recommend stopping the ASA and the metoprolol. thanks

## 2018-12-15 NOTE — Telephone Encounter (Signed)
Called patient- just wanted to inform me that he was taken off of spironolactone at discharge from the hospital, was started on metoprolol 12.5mg  BID however his rash (drug rash diagnosed by dermatologist) has gotten much worse and itching since it was started. His tremor is also worse (this is a common side effect of metoprolol). He is beginning to believe that his dermatitis may have been caused by beta blockers this whole time (he takes low dose propranolol for a bening essential tremor). Propranolol and ASA are the only 2 medications we have not changed. He knows that beta blockers are important post MI therefore he is still taking and using fluocinonide ointment. But requesting that we try to come up with an alternative treatment option for him. He did some research and doesn't really like the other options to treating his tremor.  He has an appointment with Daune Perch on 2/12. I told him I would do some research and I would speak with Sherlyn Hay about the plan I came up with prior to his appointment. If needed I could speak with him at his appointment.   Upon review patient is on triple therapy with ASA, plavix and Eliquis. The AGUSTUS trial showed in patients with afib and a recent ACS or PCI treated with a P2Y12 inhibitor, Eliquis, without ASA resulted in less bleeding and fewer hospitalizations without significant differences in the incidence of ischemic events. Since ASA and propranolol are the only 2 medications patient is still on since rash has started (over a year ago), and triple therapy puts patient at increased risk of bleed with potentially no added benefit, I think it is reasonable to stop ASA.   As far as treatment for essential tremor. Primidone is not a good option because it cannot be used with DOAC. Could try benzos (not ideal) or gabapentin.   Beta blockers are very important post ACS. Will need to discuss with patient his willingness to treat with topical steroids and willingness to  try to find a better tolerated beta blocker possibly. I will continue to do research to try to find a better solution.   I will route this to both Dr. Burt Knack and Gae Bon for their opinion

## 2018-12-16 NOTE — Consult Note (Signed)
            [facility] CM Primary Care Navigator  12/16/2018  [name], MD 04/04/1946 [id number]   Martin Majestic to see patient at the bedside to identify possible discharge needs buthewasalready dischargedhome.  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  Patient has discharge instruction tofollow-up withcardiology on 12/28/18.   Primary care provider's officeis listed as providing transition of care (TOC) follow-up.    For additional questions please contact:  Edwena Felty A. Thong Feeny, BSN, RN-BC [facility] PRIMARY CARE Navigator Cell: [phone]

## 2018-12-16 NOTE — Telephone Encounter (Signed)
1st Attempt: I left a message on the pts VM asking him to call us back.

## 2018-12-16 NOTE — Telephone Encounter (Signed)
Called patient- reviewed recommendations- stop ASA, metoprolol and propranolol. Could talk with PCP about trying gabapentin for tremor. Would not recommend primidone due to drug interaction with Eliquis. May resume spironolactone if needed for elevated BP.  Patient in agreement with plan. States he rather have the tremor than the rash. Will stop ASA, metoprolol and propranolol and resume spironolactone tonight. Will check his blood pressure daily.

## 2018-12-17 ENCOUNTER — Other Ambulatory Visit: Payer: Self-pay | Admitting: Nurse Practitioner

## 2018-12-17 ENCOUNTER — Telehealth: Payer: Self-pay | Admitting: Nurse Practitioner

## 2018-12-17 DIAGNOSIS — R195 Other fecal abnormalities: Secondary | ICD-10-CM

## 2018-12-17 MED ORDER — PANTOPRAZOLE SODIUM 40 MG PO TBEC: 40.0000 mg | DELAYED_RELEASE_TABLET | Freq: Every day | ORAL | 3 refills | Status: DC

## 2018-12-17 NOTE — Progress Notes (Signed)
New rx for PPI in setting of dark stools on TAPT.

## 2018-12-17 NOTE — Telephone Encounter (Signed)
   Pt called this morning to report that over the past 24 hrs, he has had two dark stools.  He is s/p PCI and stenting and was on asa, plavix, and eliquis, though asa was d/c'd earlier this week.  He did take plavix/eliquis this AM. He denies tarry stools or brbpr. I have sent in a Rx for protonix 40mg  daily, #30, 3 refills.  I advised that if he has more frequent or tarry stools, brbpr, orthostasis, or other symptoms, he should hold eliquis and come into the ED for eval.  He sat in the ED for 9 hrs the other day and prefers to avoid the ER if possible.  He understands not to hold plavix.  I will forward message to office as at a minimum, he will require a cbc on Monday and GI referral.  Caller verbalized understanding and was grateful for the call back.  Murray Hodgkins, NP 12/17/2018, 9:34 AM

## 2018-12-19 ENCOUNTER — Other Ambulatory Visit: Payer: Medicare Other | Admitting: *Deleted

## 2018-12-19 ENCOUNTER — Telehealth: Payer: Self-pay | Admitting: Cardiovascular Disease

## 2018-12-19 ENCOUNTER — Telehealth: Payer: Self-pay | Admitting: Allergy and Immunology

## 2018-12-19 DIAGNOSIS — R195 Other fecal abnormalities: Secondary | ICD-10-CM

## 2018-12-19 LAB — CBC WITH DIFFERENTIAL/PLATELET
Basophils Absolute: 0 10*3/uL (ref 0.0–0.2)
Basos: 0 %
EOS (ABSOLUTE): 0.3 10*3/uL (ref 0.0–0.4)
Eos: 4 %
HEMOGLOBIN: 12.7 g/dL — AB (ref 13.0–17.7)
Hematocrit: 36.7 % — ABNORMAL LOW (ref 37.5–51.0)
Lymphocytes Absolute: 0.9 10*3/uL (ref 0.7–3.1)
Lymphs: 15 %
MCH: 30.8 pg (ref 26.6–33.0)
MCHC: 34.6 g/dL (ref 31.5–35.7)
MCV: 89 fL (ref 79–97)
MONOS ABS: 0.7 10*3/uL (ref 0.1–0.9)
Monocytes: 11 %
NEUTROS PCT: 70 %
Neutrophils Absolute: 4.2 10*3/uL (ref 1.4–7.0)
Platelets: 170 10*3/uL (ref 150–450)
RBC: 4.13 x10E6/uL — ABNORMAL LOW (ref 4.14–5.80)
RDW: 12.6 % (ref 11.6–15.4)
WBC: 6.1 10*3/uL (ref 3.4–10.8)

## 2018-12-19 NOTE — Telephone Encounter (Signed)
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

## 2018-12-19 NOTE — Telephone Encounter (Signed)
[name] will come in today for CBC.  He states 24 hours after starting Protonix his stools are not as dark. He wishes to defer GI consult at this time and will wait for CBC results. He was grateful for assistance.

## 2018-12-19 NOTE — Telephone Encounter (Signed)
Patient called and stated he had an appointment 12-27-18, as a New Patient. He said he recently had a coronary stint and is on some anti coagulants. He wanted to know if this would affect his testing.

## 2018-12-19 NOTE — Telephone Encounter (Signed)
Called patient and advised per [facility] that it is ok that there should not be any issues to come in for testing. Patient stated that he is coming for an allergy to a medication and should he bring in the medication. Advised that he can bring in the medication and will see what the Dr. Lauralee Evener. Pt verbalized understanding.

## 2018-12-19 NOTE — Addendum Note (Signed)
Addended by: Harland German A on: 12/19/2018 08:49 AM   Modules accepted: Orders

## 2018-12-19 NOTE — Telephone Encounter (Signed)
I will route this to Gerome Sam RN for Dr. Burt Knack. Please see note from Ignacia Bayley, NP

## 2018-12-19 NOTE — Telephone Encounter (Signed)
See 2/1 encounter

## 2018-12-19 NOTE — Telephone Encounter (Signed)
Patient contacted regarding discharge from  [facility] on 12/14/18.  Patient understands to follow up with provider Pecolia Ades on 12/19/18 at 8:00 am at [facility]. Patient understands discharge instructions? Yes Patient understands medications and regiment? Yes Patient understands to bring all medications to this visit? Yes  Patient has already talked to Dr. Antionette Char nurse see phone note 12/17/18

## 2018-12-19 NOTE — Telephone Encounter (Signed)
See previous phone notes from 12/17/18 and 12/19/18.

## 2018-12-19 NOTE — Telephone Encounter (Signed)
New Message   PT is calling because he had some Bleeding following his stent.  The on call PA put him on Protonix and the bleeding seems better  Pt is wondering if he needs to come in and get a blood count  Please call

## 2018-12-19 NOTE — Telephone Encounter (Signed)
New Message   Pt is returning phone call for [employer]

## 2018-12-27 ENCOUNTER — Encounter: Payer: Self-pay | Admitting: Allergy and Immunology

## 2018-12-27 ENCOUNTER — Telehealth: Payer: Self-pay | Admitting: Internal Medicine

## 2018-12-27 ENCOUNTER — Ambulatory Visit (INDEPENDENT_AMBULATORY_CARE_PROVIDER_SITE_OTHER): Payer: Medicare Other | Admitting: Allergy and Immunology

## 2018-12-27 VITALS — BP 124/72 | HR 78 | Temp 97.7°F | Resp 18 | Ht 70.5 in | Wt 184.0 lb

## 2018-12-27 DIAGNOSIS — K760 Fatty (change of) liver, not elsewhere classified: Secondary | ICD-10-CM | POA: Diagnosis not present

## 2018-12-27 DIAGNOSIS — L299 Pruritus, unspecified: Secondary | ICD-10-CM

## 2018-12-27 DIAGNOSIS — L989 Disorder of the skin and subcutaneous tissue, unspecified: Secondary | ICD-10-CM

## 2018-12-27 DIAGNOSIS — L308 Other specified dermatitis: Secondary | ICD-10-CM

## 2018-12-27 MED ORDER — MONTELUKAST SODIUM 10 MG PO TABS: 10.0000 mg | ORAL_TABLET | Freq: Every day | ORAL | 5 refills | Status: DC

## 2018-12-27 NOTE — Telephone Encounter (Signed)
[name]  PT wants Melissa from Pharmacy to call back he says the message is complicated

## 2018-12-27 NOTE — Progress Notes (Addendum)
Dear Dr. Laurann Montana,  Thank you for referring Walter Mt, MD to the [facility] on 12/27/2018.   Below is a summation of this patient's evaluation and recommendations.  Thank you for your referral. I will keep you informed about this patient's response to treatment.   If you have any questions please do not hesitate to contact me.   Sincerely,  Jiles Prows, MD Allergy / Immunology 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 male who presents to the clinic on 12/27/2018 with a chief complaint of Rash .     HPI: Zymir presents to this clinic in evaluation of a pruritic disorder.  Approximately 1 year ago he developed a itchy dermatitis without any obvious provoking factor and without any associated systemic or constitutional symptoms.  Since that point in time he has seen a dermatologist and has had a biopsy of his skin performed 24 August 2018 which identified a lymphocytic infiltrate with eosinophils.  Most of his dermatitis appears to involve his torso and his extremities and appears to wax and wane depending on application of topical steroid.  He has tried various manipulations of his medications and most recently has discontinued his aspirin and metoprolol which were 2 agents that he was using prior to the onset of his dermatitis.  He has now been off aspirin and metoprolol for a total of 2 weeks.  It should also be noted that he was on Xarelto for several years prior to the onset of his dermatitis and he has changed Xarelto to Eliquis once his dermatitis developed.  He has also tried various elimination diets including peanuts and almonds  and dairy for a month which has not helped him.  Past Medical History:  Diagnosis Date  . Atrial flutter -atypical 2007   ABLATION x2 [facility]  . Atrial tachycardia ([place]) 2007   ABLATION [facility]  . Benign paroxysmal vertigo   . CAD (coronary artery disease)    a. abnl mv 01/2012;  b. cath 05/23/2012 LCX 80@ bifurcation w/ OM, 95% @ AV groove, otw nonobs dzs, EF 55-65%;  . Carcinoma ([place])    skin cancer "off my chest"  . Familial tremor   . First degree AV block    had 4 ablasions that left py with heart block  . History of blood transfusion 2010   "2nd day after prostatectomy"  . Hyperlipidemia   . Hypertension   . Incontinence    DECREASING INCONTINENCE RELATED TO PROSTATECTOMY.  . Migraines   . Presence of permanent cardiac pacemaker 2013 ?  Marland Kitchen Prostate cancer ([place])   . Pulsatile tinnitus   . Tachy-brady syndrome ([place])    a. developed after RFCA @ [facility] 2013-> MDT PPM;  b. s/p RA lead revision 05/2012    Past Surgical History:  Procedure Laterality Date  . ATRIAL ABLATION SURGERY  04/01/12   "4th time I had one"  . CARDIAC CATHETERIZATION    . CARDIOVERSION  12/03/2011   Procedure: CARDIOVERSION;  Surgeon: [name], MD;  Location: [facility];  Service: Cardiovascular;  Laterality: [name];  . CARDIOVERSION  01/13/2012   Procedure: CARDIOVERSION;  Surgeon: Lelon Perla, MD;  Location: [facility];  Service: Cardiovascular;  Laterality: [name];  . CARDIOVERSION  04/27/2012   Procedure: CARDIOVERSION;  Surgeon: Darlin Coco, MD;  Location: [facility];  Service: Cardiovascular;  Laterality: [name];  . COLONOSCOPY    . COLONOSCOPY WITH PROPOFOL [name] 10/29/2016   Procedure: COLONOSCOPY WITH PROPOFOL;  Surgeon: Doran Stabler, MD;  Location: [facility];  Service: Gastroenterology;  Laterality: [name];  . CORONARY ANGIOPLASTY WITH STENT PLACEMENT  06/02/12   "1; first one ever"  . CORONARY STENT INTERVENTION [name] 07/21/2017   Procedure: CORONARY STENT INTERVENTION;  Surgeon: [name], MD;  Location:  [place] CV [facility];  Service: Cardiovascular;  Laterality: [name];  . CORONARY STENT INTERVENTION [name] 12/13/2018   Procedure: CORONARY STENT INTERVENTION;  Surgeon: [name], MD;  Location: [place] [facility];  Service: Cardiovascular;  Laterality: [name];  . EP IMPLANTABLE DEVICE [name] 06/17/2016   Procedure: BiV Pacemaker upgrade;  Surgeon: [name], MD;  Location: [place] [facility];  Service: Cardiovascular;  Laterality: [name];  . HERNIA REPAIR  1980's  . INGUINAL HERNIA REPAIR  1945   left side  . INSERT / REPLACE / REMOVE PACEMAKER  03/2012   initial placement  . INSERT / REPLACE / REMOVE PACEMAKER  05/25/12   "atrial lead change"  . LEAD REVISION [name] 05/25/2012   Procedure: LEAD REVISION;  Surgeon: Evans Lance, MD;  Location: [facility] C[facility] LAB;  Service: Cardiovascular;  Laterality: [name];  . LEFT HEART CATH AND CORONARY ANGIOGRAPHY [name] 07/21/2017   Procedure: LEFT HEART CATH AND CORONARY ANGIOGRAPHY;  Surgeon: [name], MD;  Location: [place] CV [facility];  Service: Cardiovascular;  Laterality: [name];  . LEFT HEART CATH AND CORONARY ANGIOGRAPHY [name] 12/13/2018   Procedure: LEFT HEART CATH AND CORONARY ANGIOGRAPHY;  Surgeon: [name], MD;  Location: [place] [facility];  Service: Cardiovascular;  Laterality: [name];  . PERCUTANEOUS CORONARY STENT INTERVENTION (PCI-S) [name] 06/02/2012   Procedure: PERCUTANEOUS CORONARY STENT INTERVENTION (PCI-S);  Surgeon: Hillary Bow, MD;  Location: [facility] C[facility] LAB;  Service: Cardiovascular;  Laterality: [name];  . PROSTATECTOMY  12/21/4268   COMPLICATED  . SHOULDER SURGERY     Bilateral; "boney spurs removed"  . SKIN CANCER EXCISION     "in situ; right chest"  . TONSILLECTOMY AND ADENOIDECTOMY     "as a child"    Allergies as of 12/27/2018      Reactions   Ceclor [cefaclor] Hives   Chlorhexidine Itching, Rash   Statins Other (See Comments)   Muscle weakness Other reaction(s): Other (See Comments) Muscle weakness Muscle weakness   Ace Inhibitors Other  (See Comments)   Does not take due to "angiotensin is responsible for maintaining bladder sphincter tone"      Medication List      acyclovir 400 MG tablet Commonly known as:  ZOVIRAX Take 400 mg as directed by mouth. Herpes flare on legs   apixaban 5 MG Tabs tablet Commonly known as:  ELIQUIS Take 1 tablet (5 mg total) by mouth 2 (two) times daily.   clopidogrel 75 MG tablet Commonly known as:  PLAVIX Take 1 tablet (75 mg total) by mouth daily with breakfast.   nitroGLYCERIN 0.4 MG SL tablet Commonly known as:  NITROSTAT Place 0.4 mg under the tongue every 5 (five) minutes as needed for chest pain (x 3 tabs).   pantoprazole 40 MG tablet Commonly known as:  PROTONIX Take 1 tablet (40 mg total) by mouth daily.   spironolactone 25 MG tablet Commonly known as:  ALDACTONE  Take 1 tablet (25 mg total) by mouth daily.       Review of systems negative except as noted in HPI / PMHx or noted below:  Review of Systems  Constitutional: Negative.   HENT: Negative.   Eyes: Negative.   Respiratory: Negative.   Cardiovascular: Negative.   Gastrointestinal: Negative.   Genitourinary: Negative.   Musculoskeletal: Negative.   Skin: Negative.   Neurological: Negative.   Endo/Heme/Allergies: Negative.   Psychiatric/Behavioral: Negative.     Family History  Problem Relation Age of Onset  . Colon cancer Sister   . Heart disease Father   . Heart disease Brother        only one brother  . Diabetes Brother        only one brother    Social History   Socioeconomic History  . Marital status: Married    Spouse name: Not on file  . Number of children: Not on file  . Years of education: Not on file  . Highest education level: Not on file  Occupational History  . Occupation: Physician    Comment: Retired - ENT  Social Needs  . Financial resource strain: Not on file  . Food insecurity:    Worry: Not on file    Inability: Not on file  . Transportation needs:    Medical:  Not on file    Non-medical: Not on file  Tobacco Use  . Smoking status: Never Smoker  . Smokeless tobacco: Never Used  Substance and Sexual Activity  . Alcohol use: Yes    Alcohol/week: 3.0 standard drinks    Types: 3 Standard drinks or equivalent per week    Comment: drinks alcohol at least once every 3 months 05/25/12 "last alcohol 02/2012"  . Drug use: No  . Sexual activity: Not Currently  Lifestyle  . Physical activity:    Days per week: Not on file    Minutes per session: Not on file  . Stress: Not on file  Relationships  . Social connections:    Talks on phone: Not on file    Gets together: Not on file    Attends religious service: Not on file    Active member of club or organization: Not on file    Attends meetings of clubs or organizations: Not on file    Relationship status: Not on file  . Intimate partner violence:    Fear of current or ex partner: Not on file    Emotionally abused: Not on file    Physically abused: Not on file    Forced sexual activity: Not on file  Other Topics Concern  . 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  Objective:   Vitals:   12/27/18 0938  BP: 124/72  Pulse: 78  Resp: 18  Temp: 97.7 F (36.5 C)  SpO2: 98%   Height: 5' 10.5" (179.1 cm) Weight: 184 lb (83.5 kg)  Physical Exam Constitutional:      Appearance: He is not diaphoretic.  HENT:     Head: Normocephalic.     Right Ear: Tympanic membrane, ear canal and external ear normal.     Left Ear: Tympanic membrane, ear  canal and external ear normal.     Nose: Nose normal. No mucosal edema or rhinorrhea.     Mouth/Throat:     Pharynx: Uvula midline. No oropharyngeal exudate.  Eyes:      Conjunctiva/sclera: Conjunctivae normal.  Neck:     Thyroid: No thyromegaly.     Trachea: Trachea normal. No tracheal tenderness or tracheal deviation.  Cardiovascular:     Rate and Rhythm: Normal rate and regular rhythm.     Heart sounds: Normal heart sounds, S1 normal and S2 normal. No murmur.  Pulmonary:     Effort: No respiratory distress.     Breath sounds: Normal breath sounds. No stridor. No wheezing or rales.  Lymphadenopathy:     Head:     Right side of head: No tonsillar adenopathy.     Left side of head: No tonsillar adenopathy.     Cervical: No cervical adenopathy.  Skin:    Findings: Rash (Patchy erythematous slightly lichenified slightly scaly lesions torso extremities) present. No erythema.     Nails: There is no clubbing.   Neurological:     Mental Status: He is alert.     Diagnostics: Allergy skin tests were performed.  He did not demonstrate any hypersensitivity against a screening panel of foods.  Results of blood tests obtained 19 December 2018 identifies WBC 6.1, absolute eosinophil 300, absolute lymphocyte 900, hemoglobin 12.7, platelet 170  Results of a chest x-ray obtained 12 December 2018 identifies the following:  Stable mild cardiomegaly. Unchanged left chest wall AICD. Normal pulmonary vascularity. No focal consolidation, pleural effusion, or pneumothorax. Chronic blunting of the left costophrenic angle. No acute osseous abnormality.  Results of a CT scan abdomen obtained 07 October 2018 identified the following:  Lower chest: There is scarring in the left base region. Pacemaker leads attached to the right heart. There are foci of coronary artery calcification evident.  Hepatobiliary: There is hepatic steatosis. No focal liver lesions are evident. Gallbladder wall is not appreciably thickened. There is no evident biliary duct dilatation.  Pancreas: No pancreatic mass or inflammatory focus.  Spleen: No splenic lesions are  evident.  Adrenals/Urinary Tract: Adrenals bilaterally appear normal. Kidneys bilaterally show no evident mass or hydronephrosis on either side. There is no appreciable renal or ureteral calculus on either side. Urinary bladder is midline with wall thickness within normal limits.  Stomach/Bowel: There is diffuse stool throughout colon. There is no appreciable bowel wall or mesenteric thickening. There is no evident bowel obstruction. There is no free air or portal venous air.  Vascular/Lymphatic: There is no demonstrable abdominal aortic aneurysm. There is aortoiliac atherosclerosis. Major mesenteric arterial vessels appear patent. No adenopathy is evident in the abdomen or pelvis.  Reproductive: Prostate is absent.  No evident pelvic mass.  Other: Appendix appears normal. There is no appreciable abscess in the abdomen or pelvis.  Musculoskeletal: There is upper lumbar levoscoliosis. There is multifocal osteoarthritic change in the lower thoracic and lumbar spine. There are no appreciable blastic or lytic bone lesions. There are no intramuscular or abdominal wall lesions evident.  Assessment and Plan:    1. Inflammatory dermatosis   2. Pruritic disorder   3. Hepatic steatosis     1.  Allergen avoidance measures?  2.  Remain off aspirin and metoprolol for a full 6 weeks before restarting  3.  Use the following medications every day:   A.  Cetirizine 10 mg twice a day  B.  Famotidine 20 mg twice a day  C.  Montelukast 10 mg once a day  4.  Shower/bath followed by ointment on a daily basis.  Can use either Vaseline or topical steroid ointment  5.  Blood -LFT panel, TSH, T4, TP  6.  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  For now we will have him remain off aspirin and metoprolol for a total of 6 weeks and if this does  not help him then he can go back on those medications.  I given him a collection of medications to use to hopefully raise his itch threshold.  He does appear to have fatty liver and it would be worthwhile to make sure his LFTs are normal and also evaluate him for possible thyroiditis giving rise to his immunological hyperreactivity.  He will contact me in approximately 4 weeks regarding his response to this approach and I will contact him about the results of his blood test once they are available for review.  Jiles Prows, MD Allergy / Immunology [facility]

## 2018-12-27 NOTE — Patient Instructions (Addendum)
  1.  Allergen avoidance measures?  2.  Remain off aspirin and metoprolol for a full 6 weeks before restarting  3.  Use the following medications every day:   A.  Cetirizine 10 mg twice a day  B.  Famotidine 20 mg twice a day  C.  Montelukast 10 mg once a day  4.  Shower/bath followed by ointment on a daily basis.  Can use either Vaseline or topical steroid ointment  5.  Blood -LFT panel, TSH, T4, TP  6.  Contact clinic in 4 weeks regarding response to treatment

## 2018-12-28 ENCOUNTER — Ambulatory Visit (INDEPENDENT_AMBULATORY_CARE_PROVIDER_SITE_OTHER): Payer: Medicare Other | Admitting: Cardiology

## 2018-12-28 ENCOUNTER — Encounter: Payer: Self-pay | Admitting: Cardiology

## 2018-12-28 ENCOUNTER — Encounter: Payer: Self-pay | Admitting: Allergy and Immunology

## 2018-12-28 VITALS — BP 118/68 | HR 77 | Ht 71.0 in | Wt 188.0 lb

## 2018-12-28 DIAGNOSIS — I2510 Atherosclerotic heart disease of native coronary artery without angina pectoris: Secondary | ICD-10-CM

## 2018-12-28 DIAGNOSIS — I495 Sick sinus syndrome: Secondary | ICD-10-CM

## 2018-12-28 DIAGNOSIS — E785 Hyperlipidemia, unspecified: Secondary | ICD-10-CM

## 2018-12-28 DIAGNOSIS — I4892 Unspecified atrial flutter: Secondary | ICD-10-CM

## 2018-12-28 DIAGNOSIS — L270 Generalized skin eruption due to drugs and medicaments taken internally: Secondary | ICD-10-CM

## 2018-12-28 NOTE — Patient Instructions (Addendum)
Medication Instructions:  Your physician recommends that you continue on your current medications as directed. Please refer to the Current Medication list given to you today.  If you need a refill on your cardiac medications before your next appointment, please call your pharmacy.   Lab work: None  If you have labs (blood work) drawn today and your tests are completely normal, you will receive your results only by: Marland Kitchen MyChart Message (if you have MyChart) OR . A paper copy in the mail If you have any lab test that is abnormal or we need to change your treatment, we will call you to review the results.  Testing/Procedures: None  Follow-Up: At [facility], you and your health needs are our priority.  As part of our continuing mission to provide you with exceptional heart care, we have created designated Provider Care Teams.  These Care Teams include your primary Cardiologist (physician) and Advanced Practice Providers (APPs -  Physician Assistants and Nurse Practitioners) who all work together to provide you with the care you need, when you need it. You will need a follow up appointment in:  3-4 months.  You may see [name], MD or one of the following Advanced Practice Providers on your designated Care Team: Richardson Dopp, PA-C [place], [place] . Daune Perch, NP  Any Other Special Instructions Will Be Listed Below (If Applicable).   Coronary Artery Disease, Male  Coronary artery disease (CAD) is a condition in which the arteries that lead to the heart (coronary arteries) become narrow or blocked. The narrowing or blockage can lead to decreased blood flow to the heart. Prolonged reduced blood flow can cause a heart attack (myocardial infarction or MI). This condition may also be called coronary heart disease. Because CAD is the leading cause of death in men, it is important to understand what causes this condition and how it is treated. What are the causes? CAD is most often  caused by atherosclerosis. This is the buildup of fat and cholesterol (plaque) on the inside of the arteries. Over time, the plaque may narrow or block the artery, reducing blood flow to the heart. Plaque can also become weak and break off within a coronary artery and cause a sudden blockage. Other less common causes of CAD include:  An embolism or blood clot in a coronary artery.  A tearing of the artery (spontaneous coronary artery dissection).  An aneurysm.  Inflammation (vasculitis) in the artery wall. What increases the risk? The following factors may make you more likely to develop this condition:  Age. Men over age 86 are at a greater risk of CAD.  Family history of CAD.  Gender. Men often develop CAD earlier in life than women.  High blood pressure (hypertension).  Diabetes.  High cholesterol levels.  Tobacco use.  Excessive alcohol use.  Lack of exercise.  A diet high in saturated and trans fats, such as fried food and processed meat. Other possible risk factors include:  High stress levels.  Depression.  Obesity.  Sleep apnea. What are the signs or symptoms? Many people do not have any symptoms during the early stages of CAD. As the condition progresses, symptoms may include:  Chest pain (angina). The pain can: ? Feel like a crushing or squeezing, or a tightness, pressure, fullness, or heaviness in the chest. ? Last more than a few minutes or can stop and recur. The pain tends to get worse with exercise or stress and to fade with rest.  Pain in  the arms, neck, jaw, or back.  Unexplained heartburn or indigestion.  Shortness of breath.  Nausea or vomiting.  Sudden light-headedness.  Sudden cold sweats.  Fluttering or fast heartbeat (palpitations). How is this diagnosed? This condition is diagnosed based on:  Your family and medical history.  A physical exam.  Tests, including: ? A test to check the electrical signals in your heart  (electrocardiogram). ? Exercise stress test. This looks for signs of blockage when the heart is stressed with exercise, such as running on a treadmill. ? Pharmacologic stress test. This test looks for signs of blockage when the heart is being stressed with a medicine. ? Blood tests. ? Coronary angiogram. This is a procedure to look at the coronary arteries to see if there is any blockage. During this test, a dye is injected into your arteries so they appear on an X-ray. ? A test that uses sound waves to take a picture of your heart (echocardiogram). ? Chest X-ray. How is this treated? This condition may be treated by:  Healthy lifestyle changes to reduce risk factors.  Medicines such as: ? Antiplatelet medicines and blood-thinning medicines, such as aspirin. These help to prevent blood clots. ? Nitroglycerin. ? Blood pressure medicines. ? Cholesterol-lowering medicine.  Coronary angioplasty and stenting. During this procedure, a thin, flexible tube is inserted through a blood vessel and into a blocked artery. A balloon or similar device on the end of the tube is inflated to open up the artery. In some cases, a small, mesh tube (stent) is inserted into the artery to keep it open.  Coronary artery bypass surgery. During this surgery, veins or arteries from other parts of the body are used to create a bypass around the blockage and allow blood to reach your heart. Follow these instructions at home: Medicines  Take over-the-counter and prescription medicines only as told by your health care provider.  Do not take the following medicines unless your health care provider approves: ? NSAIDs, such as ibuprofen, naproxen, or celecoxib. ? Vitamin supplements that contain vitamin A, vitamin E, or both. Lifestyle  Follow an exercise program approved by your health care provider. Aim for 150 minutes of moderate exercise or 75 minutes of vigorous exercise each week.  Maintain a healthy weight or  lose weight as approved by your health care provider.  Rest when you are tired.  Learn to manage stress or try to limit your stress. Ask your health care provider for suggestions if you need help.  Get screened for depression and seek treatment, if needed.  Do not use any products that contain nicotine or tobacco, such as cigarettes and e-cigarettes. If you need help quitting, ask your health care provider.  Do not use illegal drugs. Eating and drinking  Follow a heart-healthy diet. A dietitian can help educate you about healthy food options and changes. In general, eat plenty of fruits and vegetables, lean meats, and whole grains.  Avoid foods high in: ? Sugar. ? Salt (sodium). ? Saturated fat, such as processed or fatty meat. ? Trans fat, such as fried foods.  Use healthy cooking methods such as roasting, grilling, broiling, baking, poaching, steaming, or stir-frying.  If you drink alcohol, and your health care provider approves, limit your alcohol intake to no more than 2 drinks per day. One drink equals 12 ounces of beer, 5 ounces of wine, or 1 ounces of hard liquor. General instructions  Manage any other health conditions, such as hypertension and diabetes. These conditions affect  your heart.  Your health care provider may ask you to monitor your blood pressure. Ideally, your blood pressure should be below 130/80.  Keep all follow-up visits as told by your health care provider. This is important. Get help right away if:  You have pain in your chest, neck, arm, jaw, stomach, or back that: ? Lasts more than a few minutes. ? Is recurring. ? Is not relieved by taking medicine under your tongue (sublingualnitroglycerin).  You have too much (profuse) sweating without cause.  You have unexplained: ? Heartburn or indigestion. ? Shortness of breath or difficulty breathing. ? Fluttering or fast heartbeat (palpitations). ? Nausea or vomiting. ? Fatigue. ? Feelings of  nervousness or anxiety. ? Weakness. ? Diarrhea.  You have sudden light-headedness or dizziness.  You faint.  You feel like hurting yourself or think about taking your own life. These symptoms may represent a serious problem that is an emergency. Do not wait to see if the symptoms will go away. Get medical help right away. Call your local emergency services (911 in the [place] Do not drive yourself to the hospital. Summary  Coronary artery disease (CAD) is a process in which the arteries that lead to the heart (coronary arteries) become narrow or blocked. The narrowing or blockage can lead to a heart attack.  Many people do not have any symptoms during the early stages of CAD. This is called "silent CAD."  CAD can be treated with lifestyle changes, medicines, surgery, or a combination of these treatments. This information is not intended to replace advice given to you by your health care provider. Make sure you discuss any questions you have with your health care provider. Document Released: 05/30/2014 Document Revised: 10/23/2016 Document Reviewed: 10/23/2016 [employer]vier Interactive Patient Education  2019 [employer].

## 2018-12-28 NOTE — Progress Notes (Signed)
Cardiology Office Note:    Date:  12/28/2018   ID:  Walter Mt, [name], DOB 14-Sep-1946, MRN [id number]  PCP:  [name], [name]  Cardiologist:  [name], [name]  Referring [name]: [name], [name]   Chief Complaint  Patient presents with  . Hospitalization Follow-up    CP, PCI    History of Present Illness:    Walter Mt, [name] is a 73 y.o. 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 Burt Knack for CAD and Dr. Caryl Comes for PPM and Afib. He was previously maintained on ASA and xarelto. However, aspirin was stopped due to possible related skin rash. He has a history of hyponatremia on thiazides.  It is statin intolerant.  [facility]  On 12/13/2018 the patient presented to the ER with exertional chest discomfort. He underwent a cardiac catheterization 12/13/2018 which showed severe mid LAD stenosis which was treated successfully with a PCI/DES.  He had continued patency of the prior LAD stent and continued patency of the left circumflex stent with chronic occlusion of the circumflex subbranch collateralized by the left to left collaterals. There was moderate stenosis in the distal RCA, unchanged from previous studies with an estimate at 50%.  He had mid global LV systolic dysfunction with LVEF estimated at 50 to 55%.  Recommendations were to resume apixaban, with continuation of ASA 81 for 1 month,  continue clopidogrel 75 mg daily for 6 months or longer per cath note.  Since hospital discharge the patient has had continued problems with skin rash presumed related to medication. Metoprolol and aspirin were stopped.  Aspirin has been suspected because of drug rash in the past.  He has also  had some dark stools that improved with addition of Protonix.  CBC was checked on 12/19/2018 and hemoglobin was stable at 12.7.  [name] is here today for hospital follow-up. He is having occ chest pain that he is not confident is angina as he does have history of musculoskeletal pain. His pain is pinpoint, unlike what took him to the hospital. He has started walking on most days. Not quite back to his previous activities but he is working on it. No dyspnea. He feels occ palpitations briefly that he relates to his afib, no recent changes.  He denies lightheadedness, edema, orthopnea. No further dark stools.   He saw an allergist yesterday and pepcid and zyrtec were added. Skin tests were done mainly for food allergies. The allergist wants pt to continue to be off BB and aspirin for 6 weeks to see if his rash resolves. His rash started a year ago and trying to find the culprit.   Patient notes a small knot in his leg next to his tibia.  He was at one time concerned that it might be a blood clot but he is convinced now that it is not as it seems to be attached to the bone and he feels like his a small hematoma.  He has no calf edema or pain.  He is also on Plavix and Eliquis.  Past Medical History:  Diagnosis Date  . Atrial flutter -atypical 2007   ABLATION x2 [facility]  . Atrial tachycardia ([place]) 2007  ABLATION [facility]  . Benign paroxysmal vertigo   . CAD (coronary artery disease)    a. abnl mv 01/2012;  b. cath 05/23/2012 LCX 80@ bifurcation w/ OM, 95% @ AV groove, otw nonobs dzs, EF 55-65%;  . Carcinoma ([place])    skin cancer "off my chest"  . Familial tremor   . First degree AV block    had 4 ablasions that left py with heart block  . History of blood transfusion 2010   "2nd day after prostatectomy"  . Hyperlipidemia   . Hypertension   . Incontinence    DECREASING INCONTINENCE RELATED TO PROSTATECTOMY.  . Migraines   . Presence of permanent cardiac pacemaker 2013 ?  Marland Kitchen Prostate cancer ([place])   .  Pulsatile tinnitus   . Tachy-brady syndrome ([place])    a. developed after RFCA @ [facility] 2013-> MDT PPM;  b. s/p RA lead revision 05/2012    Past Surgical History:  Procedure Laterality Date  . ATRIAL ABLATION SURGERY  04/01/12   "4th time I had one"  . CARDIAC CATHETERIZATION    . CARDIOVERSION  12/03/2011   Procedure: CARDIOVERSION;  Surgeon: [name], [name];  Location: [place];  Service: Cardiovascular;  Laterality: [name];  . CARDIOVERSION  01/13/2012   Procedure: CARDIOVERSION;  Surgeon: Lelon Perla, [name];  Location: [place];  Service: Cardiovascular;  Laterality: [name];  . CARDIOVERSION  04/27/2012   Procedure: CARDIOVERSION;  Surgeon: Darlin Coco, [name];  Location: [place];  Service: Cardiovascular;  Laterality: [name];  . COLONOSCOPY    . COLONOSCOPY WITH PROPOFOL [name] 10/29/2016   Procedure: COLONOSCOPY WITH PROPOFOL;  Surgeon: Doran Stabler, [name];  Location: [facility];  Service: Gastroenterology;  Laterality: [name];  . CORONARY ANGIOPLASTY WITH STENT PLACEMENT  06/02/12   "1; first one ever"  . CORONARY STENT INTERVENTION [name] 07/21/2017   Procedure: CORONARY STENT INTERVENTION;  Surgeon: [name], [name];  Location: [place] [facility];  Service: Cardiovascular;  Laterality: [name];  . CORONARY STENT INTERVENTION [name] 12/13/2018   Procedure: CORONARY STENT INTERVENTION;  Surgeon: [name], [name];  Location: [place] [facility];  Service: Cardiovascular;  Laterality: [name];  . EP IMPLANTABLE DEVICE [name] 06/17/2016   Procedure: BiV Pacemaker upgrade;  Surgeon: [name], [name];  Location: [place] [facility];  Service: Cardiovascular;  Laterality: [name];  . HERNIA REPAIR  1980's  . INGUINAL HERNIA REPAIR  1945   left side  . INSERT / REPLACE / REMOVE PACEMAKER  03/2012   initial placement  . INSERT / REPLACE / REMOVE PACEMAKER  05/25/12   "atrial lead change"  . LEAD REVISION [name] 05/25/2012   Procedure: LEAD REVISION;  Surgeon: Evans Lance, [name];  Location: [facility];  Service: Cardiovascular;   Laterality: [name];  . LEFT HEART CATH AND CORONARY ANGIOGRAPHY [name] 07/21/2017   Procedure: LEFT HEART CATH AND CORONARY ANGIOGRAPHY;  Surgeon: [name], [name];  Location: [place] [facility];  Service: Cardiovascular;  Laterality: [name];  . LEFT HEART CATH AND CORONARY ANGIOGRAPHY [name] 12/13/2018   Procedure: LEFT HEART CATH AND CORONARY ANGIOGRAPHY;  Surgeon: [name], [name];  Location: [place] [facility];  Service: Cardiovascular;  Laterality: [name];  . PERCUTANEOUS CORONARY STENT INTERVENTION (PCI-S) [name] 06/02/2012   Procedure: PERCUTANEOUS CORONARY STENT INTERVENTION (PCI-S);  Surgeon: Hillary Bow, [name];  Location: [facility] CATH LAB;  Service: Cardiovascular;  Laterality: [name];  . PROSTATECTOMY  5/0/2774   COMPLICATED  . SHOULDER SURGERY     Bilateral; "boney spurs  removed"  . SKIN CANCER EXCISION     "in situ; right chest"  . TONSILLECTOMY AND ADENOIDECTOMY     "as a child"    Current Medications: Current Meds  Medication Sig  . acyclovir (ZOVIRAX) 400 MG tablet Take 400 mg as directed by mouth. Herpes flare on legs  . apixaban (ELIQUIS) 5 MG TABS tablet Take 1 tablet (5 mg total) by mouth 2 (two) times daily.  . cetirizine (ZYRTEC) 10 MG tablet Take 10 mg by mouth daily.  . clopidogrel (PLAVIX) 75 MG tablet Take 1 tablet (75 mg total) by mouth daily with breakfast.  . famotidine (PEPCID) 20 MG tablet Take 20 mg by mouth 2 (two) times daily.  . montelukast (SINGULAIR) 10 MG tablet Take 1 tablet (10 mg total) by mouth at bedtime.  . nitroGLYCERIN (NITROSTAT) 0.4 MG SL tablet Place 0.4 mg under the tongue every 5 (five) minutes as needed for chest pain (x 3 tabs).  . pantoprazole (PROTONIX) 40 MG tablet Take 1 tablet (40 mg total) by mouth daily.  Marland Kitchen spironolactone (ALDACTONE) 25 MG tablet Take 1 tablet (25 mg total) by mouth daily.     Allergies:   Ceclor [cefaclor]; Chlorhexidine; Statins; and Ace inhibitors   Social History   Socioeconomic History  . Marital status: Married     Spouse name: Not on file  . Number of children: Not on file  . Years of education: Not on file  . Highest education level: Not on file  Occupational History  . Occupation: Physician    Comment: Retired - ENT  Social Needs  . Financial resource strain: Not on file  . Food insecurity:    Worry: Not on file    Inability: Not on file  . Transportation needs:    Medical: Not on file    Non-medical: Not on file  Tobacco Use  . Smoking status: Never Smoker  . Smokeless tobacco: Never Used  Substance and Sexual Activity  . Alcohol use: Yes    Alcohol/week: 3.0 standard drinks    Types: 3 Standard drinks or equivalent per week    Comment: drinks alcohol at least once every 3 months 05/25/12 "last alcohol 02/2012"  . Drug use: No  . Sexual activity: Not Currently  Lifestyle  . Physical activity:    Days per week: Not on file    Minutes per session: Not on file  . Stress: Not on file  Relationships  . Social connections:    Talks on phone: Not on file    Gets together: Not on file    Attends religious service: Not on file    Active member of club or organization: Not on file    Attends meetings of clubs or organizations: Not on file    Relationship status: Not on file  Other Topics Concern  . Not on file  Social History Narrative   Retired ENT     Family History: The patient's family history includes Colon cancer in his sister; Diabetes in his brother; Heart disease in his brother and father. ROS:   Please see the history of present illness.     All other systems reviewed and are negative.  EKGs/Labs/Other Studies Reviewed:    The following studies were reviewed today:  Cardiac catheterization 12/13/2018:   Previously placed Prox LAD drug eluting stent is widely patent.  Balloon angioplasty was performed.  Mid LAD to Dist LAD lesion is 95% stenosed.  Dist LAD lesion is 50% stenosed.  Prox Cx  to Mid Cx lesion is 10% stenosed.  Dist Cx lesion is 100%  stenosed.  Dist RCA lesion is 50% stenosed.  A drug-eluting stent was successfully placed using a STENT RESOLUTE ONYX 2.5X26.  Post intervention, there is a 0% residual stenosis.  There is mild left ventricular systolic dysfunction.  The left ventricular ejection fraction is 50-55% by visual estimate.  1. Severe mid LAD stenosis treated successfully with PCI using a 2.5 x 26 mm resolute Onyx DES, postdilated to high pressure with a 3.0 mm noncompliant balloon 2. Continued patency of the stented segment in the proximal LAD with no significant restenosis 3. Continued patency of the stented segment in the left circumflex with chronic occlusion of the circumflex subbranch collateralized by left to left collaterals 4. Moderate stenosis of the distal RCA, unchanged from previous studies, estimated at 50% 5. Mild global LV systolic dysfunction with LVEF estimated at 50 to 55%  Recommendations: Resume apixaban tomorrow, continue aspirin 81 mg daily x1 month, continue clopidogrel 75 mg daily x6 months. The patient has had multiple ACS events. If he is tolerating a combination of apixaban and clopidogrel without significant bleeding problems, it might be reasonable to continue longer than 6 months.     EKG:  EKG is not ordered today.    Recent Labs: 12/14/2018: BUN 17; Creatinine, Ser 0.90; Potassium 4.3; Sodium 135 12/19/2018: Hemoglobin 12.7; Platelets 170   Recent Lipid Panel    Component Value Date/Time   CHOL 191 09/13/2017 1358   TRIG 55 09/13/2017 1358   HDL 65 09/13/2017 1358   CHOLHDL 2.9 09/13/2017 1358   LDLCALC 115 (H) 09/13/2017 1358   LDLDIRECT 130 (H) 09/13/2017 1358    Physical Exam:    VS:  BP 118/68   Pulse 77   Ht 5\' 11"  (1.803 m)   Wt 188 lb (85.3 kg)   SpO2 97%   BMI 26.22 kg/m     Wt Readings from Last 3 Encounters:  12/28/18 188 lb (85.3 kg)  12/27/18 184 lb (83.5 kg)  12/14/18 182 lb 15.7 oz (83 kg)     Physical Exam  Constitutional: He is  oriented to person, place, and time. He appears well-developed and well-nourished. No distress.  HENT:  Head: Normocephalic and atraumatic.  Neck: Normal range of motion. Neck supple. No JVD present.  Cardiovascular: Normal rate, normal heart sounds and intact distal pulses. An irregularly irregular rhythm present. Exam reveals no gallop and no friction rub.  No murmur heard. Pulmonary/Chest: Effort normal and breath sounds normal. No respiratory distress. He has no wheezes. He has no rales.  Abdominal: Soft. Bowel sounds are normal.  Musculoskeletal: Normal range of motion.        General: No deformity or edema.  Neurological: He is alert and oriented to person, place, and time.  Skin: Skin is warm and dry.  Psychiatric: He has a normal mood and affect. His behavior is normal. Judgment and thought content normal.  Vitals reviewed.   ASSESSMENT:    1. Coronary artery disease involving native coronary artery of native heart without angina pectoris   2. Atrial flutter, unspecified type ([place])   3. Hyperlipidemia, unspecified hyperlipidemia type   4. Sinus node dysfunction ([facility])   5. Dermatitis due to drug reaction    PLAN:    In order of problems listed above:  CAD -Hx of prior LAD and Circ stents. Pt developed exertional chest pain and underwent LHC on 12/13/18 with stenting to mid LAD. Prior stents were  patent.  -Pt continued on Eliquis and Plavix. Metoprolol and aspirin stopped due to drug rash.  -Echo on 12/14/18 showed EF 45-50%, probably down a little related to Afib.  -No significant chest pain and no DOE.   Hx of afib -S/P ablation X4. Pt had 33% afib burden on last PPM interrogation in 09/2018 -Today patient has irregular rhythm but rate is well controlled. -On Eliquis for stroke risk reduction.  Hyperlipidemia -Intolerant to statins and Zetia. -[facility]. [facility] has appt at [facility] and plans to discuss at that  time.   History of heart block, tachybradycardia s/p PPM -Followed by Dr. Caryl Comes  Skin rash -Patient has multiple patient allergies -Metoprolol and aspirin stopped worsened skin rash -He saw an allergist yesterday with addition of Pepcid and Zyrtec.  Skin testing was done, mainly for food allergies as there is no reliable test for drug allergies.  Allergist advises to continue to hold aspirin and beta-blocker for the next 6 weeks to see if they can get his rash to improve. -Pharmacist, Marcelle Overlie, came in to speak with the patient during the visit.  It was decided that since his dark stools occurred in the setting of being n.p.o. and then initiation of triple therapy (DAPT and anticoagulant) and that his dark stools cleared after couple of days and have not reoccurred the patient could start the Pepcid and could discontinue Protonix if he wished.  Medication Adjustments/Labs and Tests Ordered: Current medicines are reviewed at length with the patient today.  Concerns regarding medicines are outlined above. Labs and tests ordered and medication changes are outlined in the patient instructions below:  Patient Instructions  Medication Instructions:  Your physician recommends that you continue on your current medications as directed. Please refer to the Current Medication list given to you today.  If you need a refill on your cardiac medications before your next appointment, please call your pharmacy.   Lab work: None  If you have labs (blood work) drawn today and your tests are completely normal, you will receive your results only by: Marland Kitchen MyChart Message (if you have MyChart) OR . A paper copy in the mail If you have any lab test that is abnormal or we need to change your treatment, we will call you to review the results.  Testing/Procedures: None  Follow-Up: At [facility], you and your health needs are our priority.  As part of our continuing mission to provide you with exceptional  heart care, we have created designated Provider Care Teams.  These Care Teams include your primary Cardiologist (physician) and Advanced Practice Providers (APPs -  Physician Assistants and Nurse Practitioners) who all work together to provide you with the care you need, when you need it. You will need a follow up appointment in:  3-4 months.  You may see [name], [name] or one of the following Advanced Practice Providers on your designated Care Team: Richardson Dopp, PA-C [place], [place] . Daune Perch, NP  Any Other Special Instructions Will Be Listed Below (If Applicable).   Coronary Artery Disease, Male  Coronary artery disease (CAD) is a condition in which the arteries that lead to the heart (coronary arteries) become narrow or blocked. The narrowing or blockage can lead to decreased blood flow to the heart. Prolonged reduced blood flow can cause a heart attack (myocardial infarction or MI). This condition may also be called coronary heart disease. Because CAD is the leading  cause of death in men, it is important to understand what causes this condition and how it is treated. What are the causes? CAD is most often caused by atherosclerosis. This is the buildup of fat and cholesterol (plaque) on the inside of the arteries. Over time, the plaque may narrow or block the artery, reducing blood flow to the heart. Plaque can also become weak and break off within a coronary artery and cause a sudden blockage. Other less common causes of CAD include:  An embolism or blood clot in a coronary artery.  A tearing of the artery (spontaneous coronary artery dissection).  An aneurysm.  Inflammation (vasculitis) in the artery wall. What increases the risk? The following factors may make you more likely to develop this condition:  Age. Men over age 34 are at a greater risk of CAD.  Family history of CAD.  Gender. Men often develop CAD earlier in life than women.  High blood pressure  (hypertension).  Diabetes.  High cholesterol levels.  Tobacco use.  Excessive alcohol use.  Lack of exercise.  A diet high in saturated and trans fats, such as fried food and processed meat. Other possible risk factors include:  High stress levels.  Depression.  Obesity.  Sleep apnea. What are the signs or symptoms? Many people do not have any symptoms during the early stages of CAD. As the condition progresses, symptoms may include:  Chest pain (angina). The pain can: ? Feel like a crushing or squeezing, or a tightness, pressure, fullness, or heaviness in the chest. ? Last more than a few minutes or can stop and recur. The pain tends to get worse with exercise or stress and to fade with rest.  Pain in the arms, neck, jaw, or back.  Unexplained heartburn or indigestion.  Shortness of breath.  Nausea or vomiting.  Sudden light-headedness.  Sudden cold sweats.  Fluttering or fast heartbeat (palpitations). How is this diagnosed? This condition is diagnosed based on:  Your family and medical history.  A physical exam.  Tests, including: ? A test to check the electrical signals in your heart (electrocardiogram). ? Exercise stress test. This looks for signs of blockage when the heart is stressed with exercise, such as running on a treadmill. ? Pharmacologic stress test. This test looks for signs of blockage when the heart is being stressed with a medicine. ? Blood tests. ? Coronary angiogram. This is a procedure to look at the coronary arteries to see if there is any blockage. During this test, a dye is injected into your arteries so they appear on an X-ray. ? A test that uses sound waves to take a picture of your heart (echocardiogram). ? Chest X-ray. How is this treated? This condition may be treated by:  Healthy lifestyle changes to reduce risk factors.  Medicines such as: ? Antiplatelet medicines and blood-thinning medicines, such as aspirin. These help to  prevent blood clots. ? Nitroglycerin. ? Blood pressure medicines. ? Cholesterol-lowering medicine.  Coronary angioplasty and stenting. During this procedure, a thin, flexible tube is inserted through a blood vessel and into a blocked artery. A balloon or similar device on the end of the tube is inflated to open up the artery. In some cases, a small, mesh tube (stent) is inserted into the artery to keep it open.  Coronary artery bypass surgery. During this surgery, veins or arteries from other parts of the body are used to create a bypass around the blockage and allow blood to reach your heart.  Follow these instructions at home: Medicines  Take over-the-counter and prescription medicines only as told by your health care provider.  Do not take the following medicines unless your health care provider approves: ? NSAIDs, such as ibuprofen, naproxen, or celecoxib. ? Vitamin supplements that contain vitamin A, vitamin E, or both. Lifestyle  Follow an exercise program approved by your health care provider. Aim for 150 minutes of moderate exercise or 75 minutes of vigorous exercise each week.  Maintain a healthy weight or lose weight as approved by your health care provider.  Rest when you are tired.  Learn to manage stress or try to limit your stress. Ask your health care provider for suggestions if you need help.  Get screened for depression and seek treatment, if needed.  Do not use any products that contain nicotine or tobacco, such as cigarettes and e-cigarettes. If you need help quitting, ask your health care provider.  Do not use illegal drugs. Eating and drinking  Follow a heart-healthy diet. A dietitian can help educate you about healthy food options and changes. In general, eat plenty of fruits and vegetables, lean meats, and whole grains.  Avoid foods high in: ? Sugar. ? Salt (sodium). ? Saturated fat, such as processed or fatty meat. ? Trans fat, such as fried foods.  Use  healthy cooking methods such as roasting, grilling, broiling, baking, poaching, steaming, or stir-frying.  If you drink alcohol, and your health care provider approves, limit your alcohol intake to no more than 2 drinks per day. One drink equals 12 ounces of beer, 5 ounces of wine, or 1 ounces of hard liquor. General instructions  Manage any other health conditions, such as hypertension and diabetes. These conditions affect your heart.  Your health care provider may ask you to monitor your blood pressure. Ideally, your blood pressure should be below 130/80.  Keep all follow-up visits as told by your health care provider. This is important. Get help right away if:  You have pain in your chest, neck, arm, jaw, stomach, or back that: ? Lasts more than a few minutes. ? Is recurring. ? Is not relieved by taking medicine under your tongue (sublingualnitroglycerin).  You have too much (profuse) sweating without cause.  You have unexplained: ? Heartburn or indigestion. ? Shortness of breath or difficulty breathing. ? Fluttering or fast heartbeat (palpitations). ? Nausea or vomiting. ? Fatigue. ? Feelings of nervousness or anxiety. ? Weakness. ? Diarrhea.  You have sudden light-headedness or dizziness.  You faint.  You feel like hurting yourself or think about taking your own life. These symptoms may represent a serious problem that is an emergency. Do not wait to see if the symptoms will go away. Get medical help right away. Call your local emergency services (911 in the [place] Do not drive yourself to the hospital. Summary  Coronary artery disease (CAD) is a process in which the arteries that lead to the heart (coronary arteries) become narrow or blocked. The narrowing or blockage can lead to a heart attack.  Many people do not have any symptoms during the early stages of CAD. This is called "silent CAD."  CAD can be treated with lifestyle changes, medicines, surgery, or a  combination of these treatments. This information is not intended to replace advice given to you by your health care provider. Make sure you discuss any questions you have with your health care provider. Document Released: 05/30/2014 Document Revised: 10/23/2016 Document Reviewed: 10/23/2016 Elsevier Interactive Patient Education  2019 Elsevier  Inc.        Signed, Daune Perch, NP  12/28/2018 8:49 AM    [facility]

## 2018-12-28 NOTE — Telephone Encounter (Signed)
Spoke with patient in clinic today. He was concerned about taking protonix and famotidine (prescribed by allergist) together. Informed him that it was ok to take together, however if his dark stools have cleared he can stop taking protonix if he would like. Patient was loaded with ASA and plavix and heparin on an empty stomach in cath lab and was on tripple therapy for several days. This most likely explains why he had darks stools. He is only on eliquis and plavix now. I think its reasonable to stop protonix if he would like.

## 2018-12-29 ENCOUNTER — Ambulatory Visit (INDEPENDENT_AMBULATORY_CARE_PROVIDER_SITE_OTHER): Payer: Medicare Other

## 2018-12-29 DIAGNOSIS — I495 Sick sinus syndrome: Secondary | ICD-10-CM | POA: Diagnosis not present

## 2018-12-29 DIAGNOSIS — I428 Other cardiomyopathies: Secondary | ICD-10-CM

## 2018-12-29 DIAGNOSIS — Z950 Presence of cardiac pacemaker: Secondary | ICD-10-CM

## 2018-12-30 LAB — CUP PACEART REMOTE DEVICE CHECK
Battery Voltage: 2.98 V
Brady Statistic AP VP Percent: 76.79 %
Brady Statistic AP VS Percent: 0.37 %
Brady Statistic AS VP Percent: 15.86 %
Brady Statistic AS VS Percent: 6.98 %
Brady Statistic RA Percent Paced: 81.31 %
Date Time Interrogation Session: 20200213105354
Implantable Lead Implant Date: 20130517
Implantable Lead Implant Date: 20130710
Implantable Lead Implant Date: 20170802
Implantable Lead Location: 753858
Implantable Lead Location: 753859
Implantable Lead Location: 753860
Implantable Pulse Generator Implant Date: 20170802
Lead Channel Impedance Value: 1045 Ohm
Lead Channel Impedance Value: 342 Ohm
Lead Channel Impedance Value: 437 Ohm
Lead Channel Impedance Value: 475 Ohm
Lead Channel Impedance Value: 475 Ohm
Lead Channel Impedance Value: 513 Ohm
Lead Channel Impedance Value: 589 Ohm
Lead Channel Impedance Value: 874 Ohm
Lead Channel Impedance Value: 893 Ohm
Lead Channel Impedance Value: 893 Ohm
Lead Channel Impedance Value: 988 Ohm
Lead Channel Pacing Threshold Amplitude: 0.75 V
Lead Channel Pacing Threshold Amplitude: 1 V
Lead Channel Pacing Threshold Amplitude: 1.375 V
Lead Channel Pacing Threshold Pulse Width: 0.4 ms
Lead Channel Pacing Threshold Pulse Width: 0.4 ms
Lead Channel Pacing Threshold Pulse Width: 0.8 ms
Lead Channel Sensing Intrinsic Amplitude: 0.5 mV
Lead Channel Sensing Intrinsic Amplitude: 0.5 mV
Lead Channel Sensing Intrinsic Amplitude: 14.375 mV
Lead Channel Sensing Intrinsic Amplitude: 14.375 mV
Lead Channel Setting Pacing Amplitude: 1.75 V
Lead Channel Setting Pacing Amplitude: 2 V
Lead Channel Setting Pacing Amplitude: 2.5 V
Lead Channel Setting Pacing Pulse Width: 0.4 ms
Lead Channel Setting Pacing Pulse Width: 1 ms
MDC IDC MSMT BATTERY REMAINING LONGEVITY: 85 mo
MDC IDC MSMT LEADCHNL LV IMPEDANCE VALUE: 532 Ohm
MDC IDC MSMT LEADCHNL LV IMPEDANCE VALUE: 988 Ohm
MDC IDC MSMT LEADCHNL RA IMPEDANCE VALUE: 342 Ohm
MDC IDC SET LEADCHNL RV SENSING SENSITIVITY: 4 mV
MDC IDC STAT BRADY RV PERCENT PACED: 92.65 %

## 2019-01-04 ENCOUNTER — Telehealth (HOSPITAL_COMMUNITY): Payer: Self-pay

## 2019-01-04 NOTE — Telephone Encounter (Signed)
Pt insurance is active and benefits verified through Medicare A/B. Co-pay $0.00, DED $198.00/$198.00 met, out of pocket $0.00/$0.00 met, co-insurance 20%. No pre-authorization required. Passport, 01/04/2019 @ 11:07AM, REF# [id number]  2ndary insurance is active and benefits verified through [facility]. Co-pay $0.00, DED $0.00/$0.00 met, out of pocket $0.00/$0.00 met, co-insurance 0%. No pre-authorization required. Passport, 01/04/2019 @ 11:09AM, REF# [id number]

## 2019-01-06 NOTE — Telephone Encounter (Signed)
Called patient to see if he is interested in the Cardiac Rehab Program. Patient expressed no interest.  Closed referral. Tedra Senegal. Support Rep II

## 2019-01-09 NOTE — Telephone Encounter (Signed)
noted 

## 2019-01-10 NOTE — Progress Notes (Signed)
Remote pacemaker transmission.   

## 2019-01-20 ENCOUNTER — Telehealth: Payer: Self-pay | Admitting: Internal Medicine

## 2019-01-20 NOTE — Telephone Encounter (Signed)
I returned pt's call and s/w pt in regards to removal of squamous cell. I did advise pt that I am going to need to s/w Dr. Maurie Boettcher office for I surgery information. I explained that once I have all the information I will get this to our Pre OP Team which they will review at that time and will make any decisions then. We will then let the surgeon's office know if cleared or not. Pt thanked me for the help.  I did s/w Wells Guiles at Dr. Maurie Boettcher office and explained what I needed. Wells Guiles did state Dr. Delman Cheadle is not sure if doing procedure or not as the squamous cell is near one of the pacemaker leads and wants to know from Dr. Caryl Comes is this going to be safe to do or not. She did say the procedure would be Elliptical Excision, with Local anesthesia if this is going to be done in the office. Wells Guiles said she is going to pass the message onto Dr. Delman Cheadle and have her touch base with our office as to medications to be held and how long, etc. I gave [phone] number to my desk. I thanked Wells Guiles for her help.

## 2019-01-20 NOTE — Telephone Encounter (Signed)
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 Please call patient he has to get removed.

## 2019-01-25 NOTE — Telephone Encounter (Signed)
I s/w Pam at Dr. Maurie Boettcher office in regards to surgical clearance. Pam has informed me that Dr. Delman Cheadle s/w the pt yesterday and the pt has opted to try the cream to help remove the squamous cell. Pt will be re-checked in 3 months which at that time it will be decided if still need to proceed with elliptical excision of squamous cells, if so will put in then for pre op clearance. I thanked Pam for her help.

## 2019-02-02 ENCOUNTER — Telehealth: Payer: Self-pay | Admitting: *Deleted

## 2019-02-02 NOTE — Telephone Encounter (Signed)
According to patient he will have to find the blood test to the [place] and will drop them off at the clinic.

## 2019-02-02 NOTE — Telephone Encounter (Signed)
Pt is still taking all medications that were prescribed by Dr.Kozlow. He hasn't seen much change in the rash or the itching. His condition is overall the same. He would like advice on what to try now. He said that you recommenced staying on the meds until after April 15th which is fine with him. He just didn't know if you had other recommendations.

## 2019-02-02 NOTE — Telephone Encounter (Signed)
Please advise 

## 2019-02-02 NOTE — Telephone Encounter (Signed)
I can not find his blood test results. Can you find and forward?

## 2019-03-01 ENCOUNTER — Telehealth: Payer: Self-pay | Admitting: Allergy and Immunology

## 2019-03-01 NOTE — Telephone Encounter (Signed)
Patient is wanting to know a future plan of care Has complied and completed all that was laid out from his last visit What is he to do now Please call

## 2019-03-01 NOTE — Telephone Encounter (Signed)
Spoke with patient to inform him we will have a sample available on Friday. Patient had a couple questions. Is patient to continue all medications that he was being treated with as prescribed? He is currently taking Zyrtec twice daily, Montelukast and Pepcid twice daily? Patient also has a history of cold sore and he was reading that [place] can cause it to flare? Do you want him to continue? Please advise

## 2019-03-01 NOTE — Telephone Encounter (Signed)
I have discussed this issue with Dr. Monday.  We are going to start dupilumab for atopic dermatitis.  Please contact him for his initial dose in the clinic with a clinic sample and I would like to have him utilize an additional 2 samples for a total of 6 weeks of treatment and then make a decision about whether or not we proceed with attempting insurance approval for this drug.

## 2019-03-01 NOTE — Telephone Encounter (Signed)
Dr. Neldon Mc do you have any further information for this patient?

## 2019-03-02 NOTE — Telephone Encounter (Signed)
Please inform patient that he should continue with meds until starting dupilumab. If he has a good response to dupilumab he can stop meds. No need to delay dupilumab secondary to cold sore.

## 2019-03-03 ENCOUNTER — Other Ambulatory Visit: Payer: Self-pay

## 2019-03-03 ENCOUNTER — Ambulatory Visit (INDEPENDENT_AMBULATORY_CARE_PROVIDER_SITE_OTHER): Payer: Medicare Other | Admitting: *Deleted

## 2019-03-03 DIAGNOSIS — L209 Atopic dermatitis, unspecified: Secondary | ICD-10-CM

## 2019-03-03 MED ORDER — DUPILUMAB 300 MG/2ML ~~LOC~~ SOSY
600.0000 mg | PREFILLED_SYRINGE | Freq: Once | SUBCUTANEOUS | Status: AC
  Administered 2019-03-03: 600 mg via SUBCUTANEOUS

## 2019-03-03 MED ORDER — EPINEPHRINE 0.3 MG/0.3ML IJ SOAJ: 0.3000 mg | Freq: Once | INTRAMUSCULAR | 2 refills | Status: AC

## 2019-03-03 NOTE — Progress Notes (Signed)
Immunotherapy   Patient Details  Name: Walter HAFFEY, MD MRN: [id number] Date of Birth: [birth year]  03/03/2019  [name], MD started injections for  DUPIXENT Frequency: Every 2 Weeks  Epi-Pen: Yes Consent signed and patient instructions given. Patient received a loading dose sample of 600mg  of Dupixent for Atopic Dermatitis. Patient waited 30 minutes and did not experience any issues. Patient is to receive 2 more 300mg  samples spanned out for the next 6 weeks per Dr. Neldon Mc. If the patient sees an improvement in symptoms with the medication then the approval will be initiated by Tammy. Patient would like to self administer Dupixent at home if these sample injections are successful.    Ashleigh Fernandez-Vernon 03/03/2019, 11:07 AM

## 2019-03-03 NOTE — Telephone Encounter (Signed)
Informed patient of instructions. Patient understood.

## 2019-03-17 ENCOUNTER — Ambulatory Visit (INDEPENDENT_AMBULATORY_CARE_PROVIDER_SITE_OTHER): Payer: Medicare Other | Admitting: *Deleted

## 2019-03-17 ENCOUNTER — Other Ambulatory Visit: Payer: Self-pay

## 2019-03-17 ENCOUNTER — Telehealth: Payer: Self-pay | Admitting: *Deleted

## 2019-03-17 DIAGNOSIS — L209 Atopic dermatitis, unspecified: Secondary | ICD-10-CM | POA: Diagnosis not present

## 2019-03-17 NOTE — Telephone Encounter (Signed)
Patient wanted to inform that first injection of Dupixent worked very well for the first 10 days then the effect started to taper down and he has experienced itching for the last 2 days. Patient received second sample injection of Dupixent today.

## 2019-03-22 ENCOUNTER — Telehealth: Payer: Self-pay | Admitting: *Deleted

## 2019-03-22 NOTE — Telephone Encounter (Signed)
Patient called stating that he wanted to inform Dr. Neldon Mc that after receiving the loading dose of Dupixent his itching returned after 10 days. After his second initial injection on 03/17/2019 it took 5 days for the itching to fully return. He wanted to know if he should continue with receiving the 3rd dose to see if there is any benefit or if a different medication should be tried. Please advise.

## 2019-03-22 NOTE — Telephone Encounter (Signed)
Yes, complete the three injection series as it does take several dosages to reach steady state levels.

## 2019-03-23 ENCOUNTER — Telehealth: Payer: Self-pay

## 2019-03-23 NOTE — Telephone Encounter (Signed)
Spoke with pt and explained he had a recent in office interrogation in November and a normal remote check in February. He states he has a number of pharmacy questions for Dr Caryl Comes regarding beginning back on his ARB and dropping spironolactone. I advised him I thought a telehealth visit would be a great opportunity for him to discuss with Dr Caryl Comes. He agreed and kept his appt for May 12 @ 2pm. Consent sent via [place].

## 2019-03-23 NOTE — Telephone Encounter (Signed)
Called patient and informed. Patient verbalized understanding.  

## 2019-03-23 NOTE — Telephone Encounter (Signed)
I called and spoke with patient, he was hesitant about switching to a video visit. He feels like his pacemaker needs to be interrogated. He would like a call back to discuss.

## 2019-03-28 ENCOUNTER — Other Ambulatory Visit: Payer: Self-pay

## 2019-03-28 ENCOUNTER — Encounter: Payer: Self-pay | Admitting: Internal Medicine

## 2019-03-28 ENCOUNTER — Telehealth (INDEPENDENT_AMBULATORY_CARE_PROVIDER_SITE_OTHER): Payer: Medicare Other | Admitting: Internal Medicine

## 2019-03-28 VITALS — BP 108/81 | HR 75 | Ht 71.0 in | Wt 184.0 lb

## 2019-03-28 DIAGNOSIS — I440 Atrioventricular block, first degree: Secondary | ICD-10-CM

## 2019-03-28 DIAGNOSIS — I428 Other cardiomyopathies: Secondary | ICD-10-CM

## 2019-03-28 DIAGNOSIS — I495 Sick sinus syndrome: Secondary | ICD-10-CM | POA: Diagnosis not present

## 2019-03-28 DIAGNOSIS — E782 Mixed hyperlipidemia: Secondary | ICD-10-CM

## 2019-03-28 DIAGNOSIS — I4819 Other persistent atrial fibrillation: Secondary | ICD-10-CM

## 2019-03-28 DIAGNOSIS — Z950 Presence of cardiac pacemaker: Secondary | ICD-10-CM

## 2019-03-28 MED ORDER — ROSUVASTATIN CALCIUM 5 MG PO TABS: 5.0000 mg | ORAL_TABLET | ORAL | 3 refills | Status: DC

## 2019-03-28 NOTE — Progress Notes (Signed)
Electrophysiology TeleHealth Note   Due to national recommendations of social distancing due to COVID 19, an audio/video telehealth visit is felt to be most appropriate for this patient at this time.  See MyChart message from today for the patient's consent to telehealth for [facility].   Date:  03/28/2019   ID:  Walter Mt, MD, DOB January 28, 1946, MRN [id number]  Location: patient's home  Provider location: [address], [place]  Evaluation Performed: Follow-up visit  PCP:  [name], MD  Cardiologist:   [facility] Electrophysiologist:  SK   Chief Complaint:  Atrial fib  History of Present Illness:    Walter Mt, MD is a 73 y.o. male who presents via audio/video conferencing for a telehealth visit today.  Since last being seen in our clinic, the patient reports hospitalization for progressive exertioinal angina 1/20>> Cath new m-dLAD 95%>>stented.  Has been statin intolerance  Referred to [facility] for PCSK9 inhibitor therapy   This has been difficult to manage    Date Cr K Hgb  1/20 0.9 4.3 12.7        The patient denies chest pain, shortness of breath, nocturnal dyspnea, orthopnea or peripheral edema.  There have been no palpitations, lightheadedness or syncope.  Some aches and pains  Reveiwed allergy notes regarding the rash now ? Atopic dermatitis  On immunotherapy with modest results   Now wonders if he can go back on his old meds    The patient denies symptoms of fevers, chills, cough, or new SOB worrisome for COVID 19.    Past Medical History:  Diagnosis Date  . Atrial flutter -atypical 2007   ABLATION x2 [facility]  . Atrial tachycardia ([place]) 2007   ABLATION [facility]  . Benign paroxysmal vertigo   . CAD (coronary artery disease)    a. abnl mv 01/2012;  b. cath 05/23/2012 LCX 80@ bifurcation w/ OM, 95% @ AV groove, otw nonobs dzs, EF 55-65%;  . Carcinoma ([place])    skin cancer "off my chest"  . Familial tremor   . First degree AV block    had 4 ablasions that  left py with heart block  . History of blood transfusion 2010   "2nd day after prostatectomy"  . Hyperlipidemia   . Hypertension   . Incontinence    DECREASING INCONTINENCE RELATED TO PROSTATECTOMY.  . Migraines   . Presence of permanent cardiac pacemaker 2013 ?  Marland Kitchen Prostate cancer ([place])   . Pulsatile tinnitus   . Tachy-brady syndrome ([place])    a. developed after RFCA @ [facility] 2013-> MDT PPM;  b. s/p RA lead revision 05/2012    Past Surgical History:  Procedure Laterality Date  . ATRIAL ABLATION SURGERY  04/01/12   "4th time I had one"  . CARDIAC CATHETERIZATION    . CARDIOVERSION  12/03/2011   Procedure: CARDIOVERSION;  Surgeon: [name], MD;  Location: [place];  Service: Cardiovascular;  Laterality: [name];  . CARDIOVERSION  01/13/2012   Procedure: CARDIOVERSION;  Surgeon: Lelon Perla, MD;  Location: [place];  Service: Cardiovascular;  Laterality: [name];  . CARDIOVERSION  04/27/2012   Procedure: CARDIOVERSION;  Surgeon: Darlin Coco, MD;  Location: [place];  Service: Cardiovascular;  Laterality: [name];  . COLONOSCOPY    . COLONOSCOPY WITH PROPOFOL [name] 10/29/2016   Procedure: COLONOSCOPY WITH PROPOFOL;  Surgeon: Doran Stabler, MD;  Location: [facility];  Service: Gastroenterology;  Laterality: [name];  . CORONARY ANGIOPLASTY WITH STENT PLACEMENT  06/02/12   "1; first one ever"  . CORONARY STENT INTERVENTION [name] 07/21/2017   Procedure: CORONARY STENT INTERVENTION;  Surgeon: [name], MD;  Location: [place] CV LAB;  Service: Cardiovascular;  Laterality: [name];  . CORONARY STENT INTERVENTION [name] 12/13/2018   Procedure: CORONARY STENT INTERVENTION;  Surgeon: [name], MD;  Location: [place] CV LAB;  Service: Cardiovascular;  Laterality: [name];  . EP IMPLANTABLE DEVICE [name] 06/17/2016   Procedure: BiV Pacemaker upgrade;  Surgeon: [name], MD;  Location: [place] CV LAB;  Service: Cardiovascular;  Laterality: [name];  . HERNIA REPAIR  1980's  . INGUINAL HERNIA REPAIR   1945   left side  . INSERT / REPLACE / REMOVE PACEMAKER  03/2012   initial placement  . INSERT / REPLACE / REMOVE PACEMAKER  05/25/12   "atrial lead change"  . LEAD REVISION [name] 05/25/2012   Procedure: LEAD REVISION;  Surgeon: Evans Lance, MD;  Location: [facility] CATH LAB;  Service: Cardiovascular;  Laterality: [name];  . LEFT HEART CATH AND CORONARY ANGIOGRAPHY [name] 07/21/2017   Procedure: LEFT HEART CATH AND CORONARY ANGIOGRAPHY;  Surgeon: [name], MD;  Location: [place] CV LAB;  Service: Cardiovascular;  Laterality: [name];  . LEFT HEART CATH AND CORONARY ANGIOGRAPHY [name] 12/13/2018   Procedure: LEFT HEART CATH AND CORONARY ANGIOGRAPHY;  Surgeon: [name], MD;  Location: [place] CV LAB;  Service: Cardiovascular;  Laterality: [name];  . PERCUTANEOUS CORONARY STENT INTERVENTION (PCI-S) [name] 06/02/2012   Procedure: PERCUTANEOUS CORONARY STENT INTERVENTION (PCI-S);  Surgeon: Hillary Bow, MD;  Location: [name] CATH LAB;  Service: Cardiovascular;  Laterality: [name];  . PROSTATECTOMY  04/20/7845   COMPLICATED  . SHOULDER SURGERY     Bilateral; "boney spurs removed"  . SKIN CANCER EXCISION     "in situ; right chest"  . TONSILLECTOMY AND ADENOIDECTOMY     "as a child"    Current Outpatient Medications  Medication Sig Dispense Refill  . apixaban (ELIQUIS) 5 MG TABS tablet Take 1 tablet (5 mg total) by mouth 2 (two) times daily. 60 tablet 3  . cetirizine (ZYRTEC) 10 MG tablet Take 10 mg by mouth 2 (two) times daily.     . clopidogrel (PLAVIX) 75 MG tablet Take 1 tablet (75 mg total) by mouth daily with breakfast. 90 tablet 4  . Dupilumab (DUPIXENT [place]) Inject 1 Dose into the skin every 14 (fourteen) days.    Marland Kitchen EPINEPHrine 0.3 mg/0.3 mL IJ SOAJ injection Inject 0.3 mLs into the skin 1 (one) time orthopaedic injection.    . famotidine (PEPCID) 20 MG tablet Take 20 mg by mouth 2 (two) times daily.    . montelukast (SINGULAIR) 10 MG tablet Take 1 tablet (10 mg total) by mouth at bedtime. 30 tablet 5   . nitroGLYCERIN (NITROSTAT) 0.4 MG SL tablet Place 0.4 mg under the tongue every 5 (five) minutes as needed for chest pain (x 3 tabs).    . pantoprazole (PROTONIX) 40 MG tablet Take 1 tablet (40 mg total) by mouth daily. 30 tablet 3  . spironolactone (ALDACTONE) 25 MG tablet Take 1 tablet (25 mg total) by mouth daily. 30 tablet 3   No current facility-administered medications for this visit.     Allergies:   Ceclor [cefaclor]; Chlorhexidine; Statins; and Ace inhibitors   Social History:  The patient  reports that he has never smoked. He has never used smokeless tobacco. He reports current alcohol use of about 3.0 standard drinks of alcohol per week. He  reports that he does not use drugs.   Family History:  The patient's   family history includes Colon cancer in his sister; Diabetes in his brother; Heart disease in his brother and father.   ROS:  Please see the history of present illness.   All other systems are personally reviewed and negative.    Exam:    Vital Signs:  BP 108/81   Pulse 75   Ht 5\' 11"  (1.803 m)   Wt 184 lb (83.5 kg)   BMI 25.66 kg/m   *   Well appearing, alert and conversant, regular work of breathing,  good skin color Eyes- anicteric, neuro- grossly intact, skin- no apparent rash or lesions or cyanosis, mouth- oral mucosa is pink   Labs/Other Tests and Data Reviewed:    Recent Labs: 12/14/2018: BUN 17; Creatinine, Ser 0.90; Potassium 4.3; Sodium 135 12/19/2018: Hemoglobin 12.7; Platelets 170   Wt Readings from Last 3 Encounters:  03/28/19 184 lb (83.5 kg)  12/28/18 188 lb (85.3 kg)  12/27/18 184 lb (83.5 kg)     Other studies personally reviewed: Additional studies/ records that were reviewed today include: *As above   Review of the above records today demonstrates: As above     Last device remote is reviewed from [place] PDF dated 2/20 which reveals normal device function,   arrhythmias - no interval AFib but lots of it/atach Also some junctional      ASSESSMENT & PLAN:    Atrial flutter/fib s/p ablation [facility] x2  High grade heart block    Premature JUNCTIONAL BEATS/Atrial beats  Sinus node dysfunction  Cardiomyopathy-mild   Pacemaker CRT - Medtronic  The patient's device was interrogated.  The information was reviewed. No changes were made in the programming.    Coronary artery disease s/p   LAD DES stenting 2013>>long term dual therapy recommended per Dr Lilly Cove  Recurrent stenting mid-dis LAD 2020   Hypertension  Rash ? Drug ? Atopic dermatitis  Overall doing well  Now that rash would like to go back on some of old meds, inclu ARBS and use the Rivaroxaban that he has  I suggested no problem  Reviewed Cath note, plan was Antiplatlet therapy for maybe only 6 month  Will have [facility] review with TDS who previously had felt antiplatlet ttherapy would be important ( dual forever)   Will try him on low dose, 2/w statin  crestor 5   Will refer him to [facility] for lipid magmant and possible PCSK9 therapy  COVID 19 screen The patient denies symptoms of COVID 19 at this time.  The importance of social distancing was discussed today.  Follow-up: 12 Next remote: As Scheduled  Current medicines are reviewed at length with the patient today.   The patient has concerns regarding his medicines.  The following changes were made today:   Begin crestor 5 mg 2/week Ok for him to switch his spironlactone to ARB if he desires, just to let us know Refer to Lipid clinic for assistance with statins and possible PCSK9 therapy   Labs/ tests ordered today include:   No orders of the defined types were placed in this encounter.   Future tests ( post COVID )     Patient Risk:  after full review of this patients clinical status, I feel that they are at moderate risk at this time.  Today, I have spent  27  minutes with the patient with telehealth technology discussing the above.  Signed, Virl Axe, MD  03/28/2019  2:03 PM     [place] [address] [phone] (office) [phone] (fax)

## 2019-03-30 ENCOUNTER — Other Ambulatory Visit: Payer: Self-pay

## 2019-03-30 ENCOUNTER — Ambulatory Visit (INDEPENDENT_AMBULATORY_CARE_PROVIDER_SITE_OTHER): Payer: Medicare Other | Admitting: *Deleted

## 2019-03-30 DIAGNOSIS — I495 Sick sinus syndrome: Secondary | ICD-10-CM | POA: Diagnosis not present

## 2019-03-30 LAB — CUP PACEART REMOTE DEVICE CHECK
Battery Remaining Longevity: 84 mo
Battery Voltage: 2.98 V
Brady Statistic AP VP Percent: 77.98 %
Brady Statistic AP VS Percent: 0.23 %
Brady Statistic AS VP Percent: 16.37 %
Brady Statistic AS VS Percent: 5.41 %
Brady Statistic RA Percent Paced: 74.15 %
Brady Statistic RV Percent Paced: 94.76 %
Date Time Interrogation Session: 20200514050415
Implantable Lead Implant Date: 20130517
Implantable Lead Implant Date: 20130710
Implantable Lead Implant Date: 20170802
Implantable Lead Location: 753858
Implantable Lead Location: 753859
Implantable Lead Location: 753860
Implantable Pulse Generator Implant Date: 20170802
Lead Channel Impedance Value: 1026 Ohm
Lead Channel Impedance Value: 1045 Ohm
Lead Channel Impedance Value: 1102 Ohm
Lead Channel Impedance Value: 342 Ohm
Lead Channel Impedance Value: 342 Ohm
Lead Channel Impedance Value: 456 Ohm
Lead Channel Impedance Value: 456 Ohm
Lead Channel Impedance Value: 494 Ohm
Lead Channel Impedance Value: 532 Ohm
Lead Channel Impedance Value: 551 Ohm
Lead Channel Impedance Value: 646 Ohm
Lead Channel Impedance Value: 874 Ohm
Lead Channel Impedance Value: 893 Ohm
Lead Channel Impedance Value: 912 Ohm
Lead Channel Pacing Threshold Amplitude: 0.625 V
Lead Channel Pacing Threshold Amplitude: 1 V
Lead Channel Pacing Threshold Amplitude: 1.375 V
Lead Channel Pacing Threshold Pulse Width: 0.4 ms
Lead Channel Pacing Threshold Pulse Width: 0.4 ms
Lead Channel Pacing Threshold Pulse Width: 0.8 ms
Lead Channel Sensing Intrinsic Amplitude: 1.375 mV
Lead Channel Sensing Intrinsic Amplitude: 1.375 mV
Lead Channel Sensing Intrinsic Amplitude: 14.125 mV
Lead Channel Sensing Intrinsic Amplitude: 14.125 mV
Lead Channel Setting Pacing Amplitude: 1.75 V
Lead Channel Setting Pacing Amplitude: 2 V
Lead Channel Setting Pacing Amplitude: 2.5 V
Lead Channel Setting Pacing Pulse Width: 0.4 ms
Lead Channel Setting Pacing Pulse Width: 1 ms
Lead Channel Setting Sensing Sensitivity: 4 mV

## 2019-03-31 ENCOUNTER — Telehealth: Payer: Self-pay | Admitting: *Deleted

## 2019-03-31 ENCOUNTER — Ambulatory Visit (INDEPENDENT_AMBULATORY_CARE_PROVIDER_SITE_OTHER): Payer: Medicare Other | Admitting: *Deleted

## 2019-03-31 ENCOUNTER — Telehealth (INDEPENDENT_AMBULATORY_CARE_PROVIDER_SITE_OTHER): Payer: Self-pay | Admitting: Pharmacist

## 2019-03-31 ENCOUNTER — Encounter: Payer: Self-pay | Admitting: Pathology

## 2019-03-31 ENCOUNTER — Other Ambulatory Visit: Payer: Self-pay

## 2019-03-31 DIAGNOSIS — L209 Atopic dermatitis, unspecified: Secondary | ICD-10-CM | POA: Diagnosis not present

## 2019-03-31 DIAGNOSIS — E782 Mixed hyperlipidemia: Secondary | ICD-10-CM

## 2019-03-31 NOTE — Progress Notes (Signed)
Patient ID: Walter Mt, MD                 DOB: June 08, 1946                    MRN: [id number]     HPI: Walter Mt, MD is a 73 y.o. male patient referred to lipid clinic by Dr. Caryl Comes. PMH is significant for a flutter, CAD s/p DES in LAD 2013, HLD, HTN, and first degree AV heart block. Patient was admitted to the hospital in January of 2020 with unstable angina with needs for recurrent stenting at that time.   Telemedicine visit conducted on 03/31/2019 due to national recommendations of social distancing due to [place] 19. Patient has experienced muscle weakness on statin therapy with a documented intolerance. Patient states that he has tried several statins including rosuvastatin in the past and has experienced muscle weakness every time. He also experienced a rash while on Zetia, which has since been discontinued. It is unclear if Zetia was the direct cause of the rash since several other medications were discontinued at the time, but the rash was found to be drug-induced by his dermatologist. Patient was recently started on rosuvastatin 5 mg 3 times per week and he reports compliance and no current issues on this dose.   Current Medications: rosuvastatin 5 mg 3/week (started 03/28/2019) Intolerances: has tried several statins per patient (muscle weakness), zetia (rash) Risk Factors: clinical ASCVD LDL goal: <70  Diet: Patient states that after his last stent placement, he has drastically changed his diet to be healthier. He reports a decreased intake of saturated fats, decreased added sugar intake and increased veggies. Since then, he reports losing 17 lbs.   Exercise: Walks 40-50 min every day  Family History: Colon cancer in sister, diabetes in brother, heart disease in brother and in father, siblings have also experienced intolerance to statin therpay  Social History: Reports never smoking, denies drug use, and reports infrequent alcohol use  Labs: 09/13/2017: TC 191, HDL 65, LDL 115, direct  LDL 130, TG 55  Past Medical History:  Diagnosis Date  . Atrial flutter -atypical 2007   ABLATION x2 [facility]  . Atrial tachycardia ([place]) 2007   ABLATION [facility]  . Benign paroxysmal vertigo   . CAD (coronary artery disease)    a. abnl mv 01/2012;  b. cath 05/23/2012 LCX 80@ bifurcation w/ OM, 95% @ AV groove, otw nonobs dzs, EF 55-65%;  . Carcinoma ([place])    skin cancer "off my chest"  . Familial tremor   . First degree AV block    had 4 ablasions that left py with heart block  . History of blood transfusion 2010   "2nd day after prostatectomy"  . Hyperlipidemia   . Hypertension   . Incontinence    DECREASING INCONTINENCE RELATED TO PROSTATECTOMY.  . Migraines   . Presence of permanent cardiac pacemaker 2013 ?  Marland Kitchen Prostate cancer ([place])   . Pulsatile tinnitus   . Tachy-brady syndrome ([place])    a. developed after RFCA @ [facility] 2013-> MDT PPM;  b. s/p RA lead revision 05/2012    Current Outpatient Medications on File Prior to Visit  Medication Sig Dispense Refill  . apixaban (ELIQUIS) 5 MG TABS tablet Take 1 tablet (5 mg total) by mouth 2 (two) times daily. 60 tablet 3  . cetirizine (ZYRTEC) 10 MG tablet Take 10 mg by mouth 2 (two) times daily.     . clopidogrel (PLAVIX) 75  MG tablet Take 1 tablet (75 mg total) by mouth daily with breakfast. 90 tablet 4  . Dupilumab (DUPIXENT [place]) Inject 1 Dose into the skin every 14 (fourteen) days.    Marland Kitchen EPINEPHrine 0.3 mg/0.3 mL IJ SOAJ injection Inject 0.3 mLs into the skin 1 (one) time orthopaedic injection.    . famotidine (PEPCID) 20 MG tablet Take 20 mg by mouth 2 (two) times daily.    . montelukast (SINGULAIR) 10 MG tablet Take 1 tablet (10 mg total) by mouth at bedtime. 30 tablet 5  . nitroGLYCERIN (NITROSTAT) 0.4 MG SL tablet Place 0.4 mg under the tongue every 5 (five) minutes as needed for chest pain (x 3 tabs).    . pantoprazole (PROTONIX) 40 MG tablet Take 1 tablet (40 mg total) by mouth daily. 30 tablet 3  . rosuvastatin (CRESTOR) 5 MG tablet  Take 1 tablet (5 mg total) by mouth 3 (three) times a week. 35 tablet 3  . spironolactone (ALDACTONE) 25 MG tablet Take 1 tablet (25 mg total) by mouth daily. 30 tablet 3   No current facility-administered medications on file prior to visit.     Allergies  Allergen Reactions  . Ceclor [Cefaclor] Hives  . Chlorhexidine Itching and Rash  . Statins Other (See Comments)    Muscle weakness Other reaction(s): Other (See Comments) Muscle weakness Muscle weakness   . Ace Inhibitors Other (See Comments)    Does not take due to "angiotensin is responsible for maintaining bladder sphincter tone"    Assessment/Plan:  1. Hyperlipidemia -   Patient seen for cholesterol management due to recurrent clinical ASCVD events. Patient is currently not at LDL goal of <70. Encouraged patient to continue healthy lifestyle modifications including diet improvements and daily exercise.   Will continue patient on rosuvastatin 5 mg 3 times weekly. Informed patient to call pharmacy clinic if he experiences any muscle weakness. Will consider addition of PCSK-9 inhibitor if needed after evaluating follow-up lipid panel.   Lipid panel scheduled for July 20th at 10 am. Educated patient to fast starting 8 pm the night before. Patient communicated understanding.   Gwenlyn Found, [place].Brock D PGY1 Pharmacy Resident  03/31/2019   3:38 PM

## 2019-03-31 NOTE — Telephone Encounter (Signed)
Patient received 3rd sample injection of Dupixent today. His next appointment is schedule for 04/14/19. Patient is wondering do we need to go ahead and start the approval process for Dupixent to ensure that he has his medication by the next injection. Please advise.

## 2019-04-03 NOTE — Progress Notes (Signed)
thx Richardson Landry - agree with you. He has had enough ACS presentations/multiple stents that I think he would be best served with clopidogrel and apixaban long term.

## 2019-04-03 NOTE — Telephone Encounter (Signed)
Called and spoke with patient and he would like to go ahead and start approval for Dupixent every 2 weeks. He did state that just in case we did not have his prescription information he used [employer] under [name]. His RF:[id number], Group: [id number], Plan #: [id number] [id number].

## 2019-04-03 NOTE — Telephone Encounter (Signed)
Please inform patient that if his Dupixent is effective we will go ahead and submit for approval.  Thus, please let Tammy know if we should go ahead for approval.

## 2019-04-04 NOTE — Telephone Encounter (Signed)
I have same approved and ready to submit to pharmacy.  L/M for patient to advise same

## 2019-04-04 NOTE — Progress Notes (Signed)
Called and left VM for patient.

## 2019-04-06 ENCOUNTER — Encounter: Payer: Self-pay | Admitting: Cardiology

## 2019-04-06 NOTE — Progress Notes (Signed)
Remote pacemaker transmission.   

## 2019-04-14 ENCOUNTER — Other Ambulatory Visit: Payer: Self-pay

## 2019-04-14 ENCOUNTER — Ambulatory Visit (INDEPENDENT_AMBULATORY_CARE_PROVIDER_SITE_OTHER): Payer: Medicare Other | Admitting: *Deleted

## 2019-04-14 DIAGNOSIS — L209 Atopic dermatitis, unspecified: Secondary | ICD-10-CM | POA: Diagnosis not present

## 2019-04-24 ENCOUNTER — Telehealth: Payer: Self-pay | Admitting: Internal Medicine

## 2019-04-24 NOTE — Telephone Encounter (Signed)
New message      Pt c/o medication issue:  1. Name of Medication: Crestor  2. How are you currently taking this medication (dosage and times per day)? 5mg  three times a week 3. Are you having a reaction (difficulty breathing--STAT)? no  4. What is your medication issue? Patient says he is not having any problems with the 5mg  three times a week.  Ok to start taking rx 5 five times a week if ok with Dr Caryl Comes.

## 2019-04-27 NOTE — Telephone Encounter (Signed)
Yes or to double the dose and keep the frequency the same

## 2019-04-28 ENCOUNTER — Ambulatory Visit (INDEPENDENT_AMBULATORY_CARE_PROVIDER_SITE_OTHER): Payer: Medicare Other | Admitting: *Deleted

## 2019-04-28 DIAGNOSIS — L209 Atopic dermatitis, unspecified: Secondary | ICD-10-CM

## 2019-05-02 ENCOUNTER — Telehealth: Payer: Self-pay

## 2019-05-02 NOTE — Telephone Encounter (Signed)
Call to screen patient prior to visit tomorrow. He understands to wear a mask. 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 He understands if any of his answers change prior to visit tomorrow to notify the office.   COVID-19 Pre-Screening Questions:  . In the past 7 to 10 days have you had a cough,  shortness of breath, headache, congestion, fever (100 or greater) body aches, chills, sore throat, or sudden loss of taste or sense of smell? NO . Have you been around anyone with known Covid 19? NO . Have you been around anyone who is awaiting Covid 19 test results in the past 7 to 10 days? NO . Have you been around anyone who has been exposed to Covid 19, or has mentioned symptoms of Covid 19 within the past 7 to 10 days? NO

## 2019-05-03 ENCOUNTER — Ambulatory Visit (INDEPENDENT_AMBULATORY_CARE_PROVIDER_SITE_OTHER): Payer: Medicare Other | Admitting: Cardiovascular Disease

## 2019-05-03 ENCOUNTER — Encounter: Payer: Self-pay | Admitting: Cardiovascular Disease

## 2019-05-03 ENCOUNTER — Other Ambulatory Visit: Payer: Self-pay

## 2019-05-03 VITALS — BP 128/82 | HR 73 | Ht 72.0 in | Wt 192.8 lb

## 2019-05-03 DIAGNOSIS — I200 Unstable angina: Secondary | ICD-10-CM | POA: Diagnosis not present

## 2019-05-03 DIAGNOSIS — I10 Essential (primary) hypertension: Secondary | ICD-10-CM

## 2019-05-03 DIAGNOSIS — E782 Mixed hyperlipidemia: Secondary | ICD-10-CM

## 2019-05-03 DIAGNOSIS — I2510 Atherosclerotic heart disease of native coronary artery without angina pectoris: Secondary | ICD-10-CM | POA: Diagnosis not present

## 2019-05-03 DIAGNOSIS — I4819 Other persistent atrial fibrillation: Secondary | ICD-10-CM | POA: Diagnosis not present

## 2019-05-03 MED ORDER — ROSUVASTATIN CALCIUM 5 MG PO TABS: 5.0000 mg | ORAL_TABLET | Freq: Every day | ORAL | 3 refills | Status: DC

## 2019-05-03 NOTE — Progress Notes (Signed)
Cardiology Office Note:    Date:  05/03/2019   ID:  Walter Mt, [name], DOB 1946/02/24, MRN [id number]  PCP:  [name], [name]  Cardiologist:  [name], [name]  Electrophysiologist:  Virl Axe, [name]   Referring [name]: [name], [name]   Chief Complaint  Patient presents with  . Coronary Artery Disease    History of Present Illness:    Walter Mt, [name] is a 73 y.o. male with a hx of extensive CAD, presenting for follow-up evaluation.  The patient also has symptomatic atrial fibrillation and is undergone multiple ablation procedures.  His last cardiac catheterization procedure in January 2020, performed for exertional chest discomfort, demonstrated severe [place] stenosis in the mid LAD and he was treated with drug-eluting stent implantation.  The proximal LAD stent and left circumflex stents remained patent.  He is now maintained on apixaban and clopidogrel.  He presents today for follow-up evaluation.  The patient is doing well from a cardiac perspective.  He said no anginal symptoms since his last PCI procedure.  He is managed with apixaban and clopidogrel and denies any bleeding problems.  He denies shortness of breath, orthopnea, PND, or syncope.  He does have occasional palpitations.  Past Medical History:  Diagnosis Date  . Atrial flutter -atypical 2007   ABLATION x2 [facility]  . Atrial tachycardia ([place]) 2007   ABLATION [facility]  . Benign paroxysmal vertigo   . CAD (coronary artery disease)    a. abnl mv 01/2012;  b. cath 05/23/2012 LCX 80@ bifurcation w/ OM, 95% @ AV groove, otw nonobs dzs, EF 55-65%;  . Carcinoma ([place])    skin cancer "off my chest"  . Familial tremor   . First degree AV block    had 4 ablasions that left py with heart block  . History of blood transfusion 2010   "2nd day after prostatectomy"  . Hyperlipidemia   . Hypertension   . Incontinence    DECREASING INCONTINENCE RELATED TO PROSTATECTOMY.  . Migraines   . Presence of permanent cardiac pacemaker 2013 ?  Marland Kitchen  Prostate cancer ([place])   . Pulsatile tinnitus   . Tachy-brady syndrome ([place])    a. developed after RFCA @ [facility] 2013-> MDT PPM;  b. s/p RA lead revision 05/2012    Past Surgical History:  Procedure Laterality Date  . ATRIAL ABLATION SURGERY  04/01/12   "4th time I had one"  . CARDIAC CATHETERIZATION    . CARDIOVERSION  12/03/2011   Procedure: CARDIOVERSION;  Surgeon: [name], [name];  Location: [place];  Service: Cardiovascular;  Laterality: [name];  . CARDIOVERSION  01/13/2012   Procedure: CARDIOVERSION;  Surgeon: Lelon Perla, [name];  Location: [place];  Service: Cardiovascular;  Laterality: [name];  . CARDIOVERSION  04/27/2012   Procedure: CARDIOVERSION;  Surgeon: Darlin Coco, [name];  Location: [place];  Service: Cardiovascular;  Laterality: [name];  . COLONOSCOPY    . COLONOSCOPY WITH PROPOFOL [name] 10/29/2016   Procedure: COLONOSCOPY WITH PROPOFOL;  Surgeon: Doran Stabler, [name];  Location: [facility];  Service: Gastroenterology;  Laterality: [name];  . CORONARY ANGIOPLASTY WITH STENT PLACEMENT  06/02/12   "1; first one ever"  . CORONARY STENT INTERVENTION [name] 07/21/2017   Procedure: CORONARY STENT INTERVENTION;  Surgeon: [name], [name];  Location: [place] [facility];  Service: Cardiovascular;  Laterality: [name];  . CORONARY STENT INTERVENTION [name] 12/13/2018   Procedure: CORONARY STENT INTERVENTION;  Surgeon: [name], [name];  Location: [place] [facility];  Service:  Cardiovascular;  Laterality: [name];  . EP IMPLANTABLE DEVICE [name] 06/17/2016   Procedure: BiV Pacemaker upgrade;  Surgeon: [name], [name];  Location: [place] [facility];  Service: Cardiovascular;  Laterality: [name];  . HERNIA REPAIR  1980's  . INGUINAL HERNIA REPAIR  1945   left side  . INSERT / REPLACE / REMOVE PACEMAKER  03/2012   initial placement  . INSERT / REPLACE / REMOVE PACEMAKER  05/25/12   "atrial lead change"  . LEAD REVISION [name] 05/25/2012   Procedure: LEAD REVISION;  Surgeon: Evans Lance, [name];  Location: [facility];   Service: Cardiovascular;  Laterality: [name];  . LEFT HEART CATH AND CORONARY ANGIOGRAPHY [name] 07/21/2017   Procedure: LEFT HEART CATH AND CORONARY ANGIOGRAPHY;  Surgeon: [name], [name];  Location: [place] CV LAB;  Service: Cardiovascular;  Laterality: [name];  . LEFT HEART CATH AND CORONARY ANGIOGRAPHY [name] 12/13/2018   Procedure: LEFT HEART CATH AND CORONARY ANGIOGRAPHY;  Surgeon: [name], [name];  Location: [place] [facility];  Service: Cardiovascular;  Laterality: [name];  . PERCUTANEOUS CORONARY STENT INTERVENTION (PCI-S) [name] 06/02/2012   Procedure: PERCUTANEOUS CORONARY STENT INTERVENTION (PCI-S);  Surgeon: Hillary Bow, [name];  Location: [facility] CATH LAB;  Service: Cardiovascular;  Laterality: [name];  . PROSTATECTOMY  12/26/7987   COMPLICATED  . SHOULDER SURGERY     Bilateral; "boney spurs removed"  . SKIN CANCER EXCISION     "in situ; right chest"  . TONSILLECTOMY AND ADENOIDECTOMY     "as a child"    Current Medications: Current Meds  Medication Sig  . apixaban (ELIQUIS) 5 MG TABS tablet Take 1 tablet (5 mg total) by mouth 2 (two) times daily.  . cetirizine (ZYRTEC) 10 MG tablet Take 10 mg by mouth 2 (two) times daily.   . clopidogrel (PLAVIX) 75 MG tablet Take 1 tablet (75 mg total) by mouth daily with breakfast.  . Dupilumab (DUPIXENT [place]) Inject 1 Dose into the skin every 14 (fourteen) days.  Marland Kitchen EPINEPHrine 0.3 mg/0.3 mL IJ SOAJ injection Inject 0.3 mLs into the skin 1 (one) time orthopaedic injection.  . famotidine (PEPCID) 20 MG tablet Take 20 mg by mouth 2 (two) times daily.  . montelukast (SINGULAIR) 10 MG tablet Take 1 tablet (10 mg total) by mouth at bedtime.  . nitroGLYCERIN (NITROSTAT) 0.4 MG SL tablet Place 0.4 mg under the tongue every 5 (five) minutes as needed for chest pain (x 3 tabs).  . rosuvastatin (CRESTOR) 5 MG tablet Take 1 tablet (5 mg total) by mouth daily.  . valsartan (DIOVAN) 80 MG tablet Take 80 mg by mouth 2 (two) times daily.  . [DISCONTINUED] rosuvastatin  (CRESTOR) 5 MG tablet Take 1 tablet (5 mg total) by mouth 3 (three) times a week.     Allergies:   Ceclor [cefaclor], Chlorhexidine, Statins, and Ace inhibitors   Social History   Socioeconomic History  . Marital status: Married    Spouse name: Not on file  . Number of children: Not on file  . Years of education: Not on file  . Highest education level: Not on file  Occupational History  . Occupation: Physician    Comment: Retired - ENT  Social Needs  . Financial resource strain: Not on file  . Food insecurity    Worry: Not on file    Inability: Not on file  . Transportation needs    Medical: Not on file    Non-medical: Not on file  Tobacco Use  . Smoking  status: Never Smoker  . Smokeless tobacco: Never Used  Substance and Sexual Activity  . Alcohol use: Yes    Alcohol/week: 3.0 standard drinks    Types: 3 Standard drinks or equivalent per week    Comment: drinks alcohol at least once every 3 months 05/25/12 "last alcohol 02/2012"  . Drug use: No  . Sexual activity: Not Currently  Lifestyle  . Physical activity    Days per week: Not on file    Minutes per session: Not on file  . Stress: Not on file  Relationships  . Social Herbalist on phone: Not on file    Gets together: Not on file    Attends religious service: Not on file    Active member of club or organization: Not on file    Attends meetings of clubs or organizations: Not on file    Relationship status: Not on file  Other Topics Concern  . Not on file  Social History Narrative   Retired ENT     Family History: The patient's family history includes Colon cancer in his sister; Diabetes in his brother; Heart disease in his brother and father.  ROS:   Please see the history of present illness.    All other systems reviewed and are negative.  EKGs/Labs/Other Studies Reviewed:    The following studies were reviewed today: Cardiac cath: Conclusion    Previously placed Prox LAD drug eluting  stent is widely patent.  Balloon angioplasty was performed.  Mid LAD to Dist LAD lesion is 95% stenosed.  Dist LAD lesion is 50% stenosed.  Prox Cx to Mid Cx lesion is 10% stenosed.  Dist Cx lesion is 100% stenosed.  Dist RCA lesion is 50% stenosed.  A drug-eluting stent was successfully placed using a STENT RESOLUTE ONYX 2.5X26.  Post intervention, there is a 0% residual stenosis.  There is mild left ventricular systolic dysfunction.  The left ventricular ejection fraction is 50-55% by visual estimate.   1.  Severe mid LAD stenosis treated successfully with PCI using a 2.5 x 26 mm resolute Onyx DES, postdilated to high pressure with a 3.0 mm noncompliant balloon 2.  Continued patency of the stented segment in the proximal LAD with no significant restenosis 3.  Continued patency of the stented segment in the left circumflex with chronic occlusion of the circumflex subbranch collateralized by left to left collaterals 4.  Moderate stenosis of the distal RCA, unchanged from previous studies, estimated at 50% 5.  Mild global LV systolic dysfunction with LVEF estimated at 50 to 55%  Recommendations: Resume apixaban tomorrow, continue aspirin 81 mg daily x1 month, continue clopidogrel 75 mg daily x6 months.  The patient has had multiple ACS events.  If he is tolerating a combination of apixaban and clopidogrel without significant bleeding problems, it might be reasonable to continue longer than 6 months.     EKG:  EKG is not ordered today.    Recent Labs: 12/14/2018: BUN 17; Creatinine, Ser 0.90; Potassium 4.3; Sodium 135 12/19/2018: Hemoglobin 12.7; Platelets 170  Recent Lipid Panel    Component Value Date/Time   CHOL 191 09/13/2017 1358   TRIG 55 09/13/2017 1358   HDL 65 09/13/2017 1358   CHOLHDL 2.9 09/13/2017 1358   LDLCALC 115 (H) 09/13/2017 1358   LDLDIRECT 130 (H) 09/13/2017 1358    Physical Exam:    VS:  BP 128/82   Pulse 73   Ht 6' (1.829 m)   Wt 192 lb 12.8  oz  (87.5 kg)   SpO2 99%   BMI 26.15 kg/m     Wt Readings from Last 3 Encounters:  05/03/19 192 lb 12.8 oz (87.5 kg)  03/28/19 184 lb (83.5 kg)  12/28/18 188 lb (85.3 kg)     GEN:  Well nourished, well developed in no acute distress HEENT: Normal NECK: No JVD; No carotid bruits LYMPHATICS: No lymphadenopathy CARDIAC: RRR, no murmurs, rubs, gallops RESPIRATORY:  Clear to auscultation without rales, wheezing or rhonchi  ABDOMEN: Soft, non-tender, non-distended MUSCULOSKELETAL:  No edema; No deformity  SKIN: Warm and dry NEUROLOGIC:  Alert and oriented x 3 PSYCHIATRIC:  Normal affect   ASSESSMENT:    1. Coronary artery disease involving native coronary artery of native heart without angina pectoris   2. Persistent atrial fibrillation   3. Essential hypertension   4. Mixed hyperlipidemia    PLAN:    In order of problems listed above:  1. He appears stable.  I think he should be maintained on long-term clopidogrel 75 mg daily in the context of chronic oral anticoagulation.  We discussed concerns over bleeding risk and he will remain off of aspirin.  No changes are made in his regimen today.  I will see him back in 6 months. 2. Followed by Dr. Caryl Comes.  Anticoagulated with apixaban. 3. Blood pressure is controlled now that he has resumed valsartan.  He brings in readings and these are reviewed today.  He is at goal. 4. The patient is tolerating Crestor 5 mg 3 days weekly.  He will try to incrementally increase the number of days he is taking this.  I wrote him a prescription for Crestor 5 mg daily so that he has enough supply.   Medication Adjustments/Labs and Tests Ordered: Current medicines are reviewed at length with the patient today.  Concerns regarding medicines are outlined above.  No orders of the defined types were placed in this encounter.  Meds ordered this encounter  Medications  . rosuvastatin (CRESTOR) 5 MG tablet    Sig: Take 1 tablet (5 mg total) by mouth daily.     Dispense:  90 tablet    Refill:  3    Patient Instructions  Medication Instructions:  You have been given a prescription for Crestor 5 mg daily.  Labwork: None  Testing/Procedures: None  Follow-Up: Your provider wants you to follow-up in: 6 months with Dr. Burt Knack. You will receive a reminder letter in the mail two months in advance. If you don't receive a letter, please call our office to schedule the follow-up appointment.      Signed, [name], [name]  05/03/2019 2:18 PM    [place]

## 2019-05-03 NOTE — Patient Instructions (Signed)
Medication Instructions:  You have been given a prescription for Crestor 5 mg daily.  Labwork: None  Testing/Procedures: None  Follow-Up: Your provider wants you to follow-up in: 6 months with Dr. Burt Knack. You will receive a reminder letter in the mail two months in advance. If you don't receive a letter, please call our office to schedule the follow-up appointment.

## 2019-05-10 ENCOUNTER — Telehealth: Payer: Self-pay | Admitting: Allergy and Immunology

## 2019-05-10 NOTE — Telephone Encounter (Signed)
[name] would like to talk to you about the side effects of Dupixent.

## 2019-05-11 NOTE — Telephone Encounter (Signed)
Have discussed issues with Dr. Rosanna Randy.  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  The problem he is having is a low-grade nasopharyngitis with some discomfort in that area that is waxing and waning that is been around for about a month without any associated systemic or constitutional symptoms.  Specifically, he does not have any symptoms to suggest LPR and he does not have any symptoms to suggest an ongoing infection of his upper airway.  He did read in the package insert that nasopharyngitis occurs in 10% of people using dupilumab.  This is not a significant life altering complication of dupilumab but he just wanted it noted.  I informed him that should he develop increasing symptoms with nasopharyngitis or associated systemic or constitutional symptoms and obviously he will require further evaluation regarding this issue.  He will continue on dupilumab given its beneficial effect regarding his longstanding inflammatory dermatosis.

## 2019-05-12 ENCOUNTER — Other Ambulatory Visit: Payer: Self-pay

## 2019-05-12 ENCOUNTER — Ambulatory Visit (INDEPENDENT_AMBULATORY_CARE_PROVIDER_SITE_OTHER): Payer: Medicare Other | Admitting: *Deleted

## 2019-05-12 DIAGNOSIS — L209 Atopic dermatitis, unspecified: Secondary | ICD-10-CM

## 2019-05-29 ENCOUNTER — Ambulatory Visit (INDEPENDENT_AMBULATORY_CARE_PROVIDER_SITE_OTHER): Payer: Medicare Other

## 2019-05-29 ENCOUNTER — Other Ambulatory Visit: Payer: Self-pay

## 2019-05-29 DIAGNOSIS — L209 Atopic dermatitis, unspecified: Secondary | ICD-10-CM | POA: Diagnosis not present

## 2019-05-29 MED ORDER — DUPILUMAB 300 MG/2ML ~~LOC~~ SOSY
300.0000 mg | PREFILLED_SYRINGE | SUBCUTANEOUS | Status: AC
  Administered 2019-05-29 – 2023-05-06 (×85): 300 mg via SUBCUTANEOUS

## 2019-06-05 ENCOUNTER — Other Ambulatory Visit: Payer: Self-pay

## 2019-06-05 ENCOUNTER — Other Ambulatory Visit: Payer: Medicare Other | Admitting: *Deleted

## 2019-06-05 DIAGNOSIS — E782 Mixed hyperlipidemia: Secondary | ICD-10-CM

## 2019-06-05 LAB — LIPID PANEL
Chol/HDL Ratio: 2.6 ratio (ref 0.0–5.0)
Cholesterol, Total: 150 mg/dL (ref 100–199)
HDL: 58 mg/dL (ref >39)
LDL Calculated: 80 mg/dL (ref 0–99)
Triglycerides: 59 mg/dL (ref 0–149)
VLDL Cholesterol Cal: 12 mg/dL (ref 5–40)

## 2019-06-05 LAB — HEPATIC FUNCTION PANEL
ALT: 15 IU/L (ref 0–44)
AST: 22 IU/L (ref 0–40)
Albumin: 4.4 g/dL (ref 3.7–4.7)
Alkaline Phosphatase: 106 IU/L (ref 39–117)
Bilirubin Total: 1.4 mg/dL — ABNORMAL HIGH (ref 0.0–1.2)
Bilirubin, Direct: 0.38 mg/dL (ref 0.00–0.40)
Total Protein: 6.3 g/dL (ref 6.0–8.5)

## 2019-06-08 ENCOUNTER — Telehealth: Payer: Self-pay | Admitting: Pharmacist

## 2019-06-08 NOTE — Telephone Encounter (Signed)

## 2019-06-12 ENCOUNTER — Other Ambulatory Visit: Payer: Self-pay

## 2019-06-12 ENCOUNTER — Ambulatory Visit (INDEPENDENT_AMBULATORY_CARE_PROVIDER_SITE_OTHER): Payer: Medicare Other

## 2019-06-12 ENCOUNTER — Encounter: Payer: Self-pay | Admitting: Pharmacist

## 2019-06-12 ENCOUNTER — Ambulatory Visit (INDEPENDENT_AMBULATORY_CARE_PROVIDER_SITE_OTHER): Payer: Medicare Other | Admitting: Pharmacist

## 2019-06-12 DIAGNOSIS — I200 Unstable angina: Secondary | ICD-10-CM | POA: Diagnosis not present

## 2019-06-12 DIAGNOSIS — L209 Atopic dermatitis, unspecified: Secondary | ICD-10-CM | POA: Diagnosis not present

## 2019-06-12 DIAGNOSIS — E785 Hyperlipidemia, unspecified: Secondary | ICD-10-CM

## 2019-06-12 MED ORDER — ROSUVASTATIN CALCIUM 5 MG PO TABS: ORAL_TABLET | ORAL | 11 refills | Status: DC

## 2019-06-12 MED ORDER — ROSUVASTATIN CALCIUM 10 MG PO TABS: 10.0000 mg | ORAL_TABLET | Freq: Every day | ORAL | 3 refills | Status: DC

## 2019-06-12 NOTE — Progress Notes (Signed)
Patient ID: Walter Mt, MD                 DOB: November 24, 1945                    MRN: [id number]     HPI: Walter Mt, MD is a 73 y.o. male patient referred to lipid clinic by Dr. Caryl Comes. 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  Patient presents today to the lipid clinic for follow up.  Patient has a history of statin intolerance. He did try to get approval through [place] for PCSK9 inhibitors but they told him he needed to provide documentation of every statin he's tried and he doesn't have record as its been a 30 year process. He is currently on rosuvastatin 5mg  daily. Tolerating well. States he has a little bit of stiffness, but it is fine.  Current Medications: rosuvastatin 5mg  daily Intolerances: has tried several statins per patient (muscle weakness), zetia (rash) Risk Factors: clinical ASCVD LDL goal: <70  Diet: Patient states that after his last stent placement, he has drastically changed his diet to be healthier. He reports a decreased intake of saturated fats, decreased added sugar intake and increased veggies. Diet currently is not as drastic as he lost a significant amount of weight, but is still eating healthy  Exercise: Walks 40-50 min every day  Family History: Colon cancer in sister, diabetes in brother, heart disease in brother and in father, siblings have also experienced intolerance to statin therpay  Social History: Reports never smoking, denies drug use, and reports infrequent alcohol use  Labs:  06/05/2019: TC 150, TG 59, HDL 58, LDL 80 09/13/2017: TC 191, HDL 65, LDL 115, direct LDL 130, TG 55  Past Medical History:  Diagnosis Date  . Atrial flutter -atypical 2007   ABLATION x2 [facility]  . Atrial tachycardia ([place]) 2007   ABLATION [facility]  . Benign paroxysmal vertigo   . CAD (coronary artery disease)    a. abnl mv 01/2012;  b. cath 05/23/2012 LCX  80@ bifurcation w/ OM, 95% @ AV groove, otw nonobs dzs, EF 55-65%;  . Carcinoma ([place])    skin cancer "off my chest"  . Familial tremor   . First degree AV block    had 4 ablasions that left py with heart block  . History of blood transfusion 2010   "2nd day after prostatectomy"  . Hyperlipidemia   . Hypertension   . Incontinence    DECREASING INCONTINENCE RELATED TO PROSTATECTOMY.  . Migraines   . Presence of permanent cardiac pacemaker 2013 ?  Marland Kitchen Prostate cancer ([place])   . Pulsatile tinnitus   . Tachy-brady syndrome ([place])    a. developed after RFCA @ [facility] 2013-> MDT PPM;  b. s/p RA lead revision 05/2012    Current Outpatient Medications on File Prior to Visit  Medication Sig Dispense Refill  . apixaban (ELIQUIS) 5 MG TABS tablet Take 1 tablet (5 mg total) by mouth 2 (two) times daily. 60 tablet 3  . cetirizine (ZYRTEC) 10 MG tablet Take 10 mg by mouth 2 (two) times daily.     . clopidogrel (PLAVIX) 75 MG tablet Take 1 tablet (75 mg total) by mouth daily with breakfast. 90 tablet 4  . Dupilumab (DUPIXENT [place]) Inject 1 Dose into the skin every 14 (fourteen) days.    Marland Kitchen  EPINEPHrine 0.3 mg/0.3 mL IJ SOAJ injection Inject 0.3 mLs into the skin 1 (one) time orthopaedic injection.    . famotidine (PEPCID) 20 MG tablet Take 20 mg by mouth 2 (two) times daily.    . montelukast (SINGULAIR) 10 MG tablet Take 1 tablet (10 mg total) by mouth at bedtime. 30 tablet 5  . nitroGLYCERIN (NITROSTAT) 0.4 MG SL tablet Place 0.4 mg under the tongue every 5 (five) minutes as needed for chest pain (x 3 tabs).    . rosuvastatin (CRESTOR) 5 MG tablet Take 1 tablet (5 mg total) by mouth daily. 90 tablet 3  . valsartan (DIOVAN) 80 MG tablet Take 80 mg by mouth 2 (two) times daily.     Current Facility-Administered Medications on File Prior to Visit  Medication Dose Route Frequency Provider Last Rate Last Dose  . dupilumab (DUPIXENT) prefilled syringe 300 mg  300 mg Subcutaneous Q14 Days Kozlow, Donnamarie Poag, MD   300  mg at 05/29/19 1014    Allergies  Allergen Reactions  . Ceclor [Cefaclor] Hives  . Chlorhexidine Itching and Rash  . Ace Inhibitors Other (See Comments)    Does not take due to "angiotensin is responsible for maintaining bladder sphincter tone"    Assessment/Plan:  1. Hyperlipidemia - LDL above goal of <70. Patient is tolerating rosuvastatin 5mg  daily well. Discussed treatment options including PCSK9 inhibitors. Discussed their LDL lowering ability and cardiovascular protection. Would probably avoid zetia as patient's issues with rash started shortly after starting zetia and outcomes data not as strong as with PCSK9 inhibitors. Patient wishes currently to try increasing the dose of rosuvastatin. Will try to increase to 5mg  daily except 10mg  two days a week. Patient will increase up to 10mg  daily as tolerated. Will repeat lipid panel in 10 weeks. He was instructed to call with any issues. If LDL is not at goal on repeat panel, will need to strongly consider PCSK9 inhibitor.  2. Hypertension- patient stated that his blood pressure has been running low systolic low 440'H in the AM. He has been feeling dizzy. States he has been skipping the second dose. Asked if he could cut the tablets. Advised that the tablets can be cut. Should take 1/2 tablet twice a day.   Thank you,  Ramond Dial, Pharm.D, [facility]  [address] [place], [place] [address]  Phone: [phone]; Fax: [phone]

## 2019-06-12 NOTE — Patient Instructions (Addendum)
It was great to see you again! Try increasing your rosuvastatin to 5mg  daily except 10mg  two times a week. Increase the dose as tolerated. Recheck labs in 10 weeks  Give [place] a call in [phone] with any questions or concerns

## 2019-06-13 ENCOUNTER — Telehealth: Payer: Self-pay

## 2019-06-13 NOTE — Telephone Encounter (Signed)
**Note De-Identified Abrie Egloff Obfuscation** We received a Rosuvastatin quantity exception request from [place]. I called [facility] pharmacy and s/w Lissa Merlin who advised that the pts insurance most likely will not cover Rosuvastatin 5 mg take 2 tablets daily because there is a 10 mg tablet available.   I changed the RX to Rosuvastatin 10 mg take 1 tablet daily and per the [place] the pts insurance is now paying,

## 2019-06-14 ENCOUNTER — Telehealth: Payer: Self-pay | Admitting: Internal Medicine

## 2019-06-14 NOTE — Telephone Encounter (Signed)
Pt calling Dr Caryl Comes to let him know his plan for LDL and statin medication. He states he and Marcelle Overlie, Pharm-D have decided to switch his atorvastatin to 10mg  two days per week. He will recheck his lipids in October.

## 2019-06-14 NOTE — Telephone Encounter (Signed)
Follow Up:   Pt said he is returning your call.

## 2019-06-21 NOTE — Telephone Encounter (Signed)
thanks

## 2019-06-26 ENCOUNTER — Other Ambulatory Visit: Payer: Self-pay

## 2019-06-26 ENCOUNTER — Ambulatory Visit (INDEPENDENT_AMBULATORY_CARE_PROVIDER_SITE_OTHER): Payer: Medicare Other | Admitting: *Deleted

## 2019-06-26 DIAGNOSIS — L209 Atopic dermatitis, unspecified: Secondary | ICD-10-CM | POA: Diagnosis not present

## 2019-06-29 ENCOUNTER — Telehealth: Payer: Self-pay | Admitting: Internal Medicine

## 2019-06-29 NOTE — Telephone Encounter (Signed)
New message:     Patient calling stating he is getting a code on his device 3230. Please call patient.

## 2019-06-29 NOTE — Telephone Encounter (Signed)
Pt states he spoke w/ [employer] and they are sending him a new monitor. He was supposed to send a transmission today but will not receive his new monitor until the end of the month. Will reschedule his remote transmission.

## 2019-07-10 ENCOUNTER — Ambulatory Visit (INDEPENDENT_AMBULATORY_CARE_PROVIDER_SITE_OTHER): Payer: Medicare Other | Admitting: *Deleted

## 2019-07-10 ENCOUNTER — Other Ambulatory Visit: Payer: Self-pay

## 2019-07-10 DIAGNOSIS — L209 Atopic dermatitis, unspecified: Secondary | ICD-10-CM

## 2019-07-13 ENCOUNTER — Telehealth: Payer: Self-pay

## 2019-07-13 ENCOUNTER — Encounter: Payer: Self-pay | Admitting: Orthopedic Surgery

## 2019-07-13 ENCOUNTER — Ambulatory Visit (INDEPENDENT_AMBULATORY_CARE_PROVIDER_SITE_OTHER): Payer: Medicare Other | Admitting: Orthopedic Surgery

## 2019-07-13 VITALS — Ht 72.0 in | Wt 192.0 lb

## 2019-07-13 DIAGNOSIS — I200 Unstable angina: Secondary | ICD-10-CM

## 2019-07-13 DIAGNOSIS — L600 Ingrowing nail: Secondary | ICD-10-CM

## 2019-07-13 NOTE — Telephone Encounter (Signed)
Patient came in requesting Dr Neldon Mc send a letter to Dr. Shelah Lewandowsky introducing who he is and inform him that the patient is on [place]. The patient is trying to get the [place] to cover this drug.  Please Advise.

## 2019-07-13 NOTE — Telephone Encounter (Signed)
Patient has been informed.

## 2019-07-13 NOTE — Telephone Encounter (Signed)
Please inform Dr. Monday that I have sent an email to Dr. Shelah Lewandowsky about dupilumab use at the [facility].

## 2019-07-14 ENCOUNTER — Encounter: Payer: Self-pay | Admitting: Orthopedic Surgery

## 2019-07-14 ENCOUNTER — Telehealth: Payer: Self-pay | Admitting: Allergy and Immunology

## 2019-07-14 NOTE — Progress Notes (Signed)
Office Visit Note   [name], MD           Date of Birth: [birth year]           MRN: [id number] Visit Date: 07/13/2019              Requested by: [name], MD [facility] [employer] [address],  [place] [address] PCP: [name], MD  Chief Complaint  Patient presents with   Left Foot - Pain    Ingrown nail GT      HPI: Patient is a 73 year old gentleman who presents with paronychial infection with ingrown left great toenail medial border.  Patient complains of pain with ambulation.  Assessment & Plan: Visit Diagnoses:  1. Ingrown nail of great toe of left foot     Plan: The medial border was excised patient will start antibiotic ointment dressing changes tomorrow increase activities as tolerated  Follow-Up Instructions: Return if symptoms worsen or fail to improve.   Ortho Exam  Patient is alert, oriented, no adenopathy, well-dressed, normal affect, normal respiratory effort. Examination patient has a good pulse he has redness and swelling around the medial border with an ingrown toenail this is tender to palpation clinically patient has infected paronychial infection.  After informed consent patient underwent a digital block with 10 cc of 1% lidocaine plain the medial border of the left nail was excised from the great toe sterile dressing was applied.  Imaging: No results found. No images are attached to the encounter.  Labs: Lab Results  Component Value Date   REPTSTATUS 02/25/2009 FINAL 02/25/2009     Lab Results  Component Value Date   ALBUMIN 4.4 06/05/2019   ALBUMIN 4.3 08/22/2012   ALBUMIN 4.4 02/12/2009    Lab Results  Component Value Date   MG 2.1 01/23/2012   MG 2.1 01/22/2012   MG 2.2 01/21/2012   No results found for: VD25OH  No results found for: PREALBUMIN CBC EXTENDED Latest Ref Rng & Units 12/19/2018 12/14/2018 12/12/2018  WBC 3.4 - 10.8 x10E3/uL 6.1 5.1 5.6  RBC 4.14 - 5.80 x10E6/uL 4.13(L) 4.10(L) 4.46  HGB 13.0 -  17.7 g/dL 12.7(L) 12.5(L) 13.4  HCT 37.5 - 51.0 % 36.7(L) 36.6(L) 40.8  PLT 150 - 450 x10E3/uL 170 135(L) 162  NEUTROABS 1.4 - 7.0 x10E3/uL 4.2 - -  LYMPHSABS 0.7 - 3.1 x10E3/uL 0.9 - -     Body mass index is 26.04 kg/m.  Orders:  No orders of the defined types were placed in this encounter.  No orders of the defined types were placed in this encounter.    Procedures: No procedures performed  Clinical Data: No additional findings.  ROS:  All other systems negative, except as noted in the HPI. Review of Systems  Objective: Vital Signs: Ht 6' (1.829 m)    Wt 192 lb (87.1 kg)    BMI 26.04 kg/m   Specialty Comments:  No specialty comments available.  PMFS History: Patient Active Problem List   Diagnosis Date Noted   Unstable angina ([place]) 12/13/2018   Family history of colon cancer    Rectal polyp    Cardiomyopathy, nonischemic ([place]) 06/17/2016   Malignant tumor of prostate ([place]) 05/11/2016   Dyspnea on exertion 11/25/2012   Hyponatremia 09/06/2012   CAD (coronary artery disease) 05/26/2012   Pacemaker-[employer]tronic 04/15/2012   Benign positional vertigo 03/31/2012   First degree AV block 03/31/2012   Persistent atrial fibrillation 03/31/2012   Sinus node dysfunction ([place]) 03/31/2012  Complete heart block [facility])    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 abnl mv 01/2012;  b. cath 05/23/2012 LCX 80@ bifurcation w/ OM, 95% @ AV groove, otw nonobs dzs, EF 55-65%;   Carcinoma ([place])    skin cancer "off my chest"   Familial tremor    First degree AV  block    had 4 ablasions that left py with heart block   History of blood transfusion 2010   "2nd day after prostatectomy"   Hyperlipidemia    Hypertension    Incontinence    DECREASING INCONTINENCE RELATED TO PROSTATECTOMY.   Migraines    Presence of permanent cardiac pacemaker 2013 ?   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 developed after RFCA @ [facility] 2013-> MDT PPM;  b. s/p RA lead revision 05/2012    Family History  Problem Relation Age of Onset   Colon cancer Sister    Heart disease Father    Heart disease Brother        only one brother   Diabetes Brother        only one brother    Past Surgical History:  Procedure Laterality Date   ATRIAL ABLATION SURGERY  04/01/12   "4th time I had one"   [place]  12/03/2011   Procedure: CARDIOVERSION;  Surgeon: [name], MD;  Location: [facility];  Service: Cardiovascular;  Laterality: [name];   CARDIOVERSION  01/13/2012   Procedure: CARDIOVERSION;  Surgeon: Lelon Perla, MD;  Location: [facility];  Service: Cardiovascular;  Laterality: [name];   CARDIOVERSION  04/27/2012   Procedure: CARDIOVERSION;  Surgeon: Darlin Coco, MD;  Location: [facility] OR;  Service: Cardiovascular;  Laterality: [name];   COLONOSCOPY     COLONOSCOPY WITH PROPOFOL [name] 10/29/2016   Procedure: COLONOSCOPY WITH PROPOFOL;  Surgeon: Doran Stabler, MD;  Location: [facility];  Service: Gastroenterology;  Laterality: [name];   CORONARY ANGIOPLASTY WITH STENT PLACEMENT  06/02/12   "1; first one ever"   CORONARY STENT INTERVENTION [name] 07/21/2017   Procedure: CORONARY STENT INTERVENTION;  Surgeon: [name], MD;  Location: [place] CV LAB;  Service: Cardiovascular;  Laterality: [name];   CORONARY STENT INTERVENTION [name] 12/13/2018   Procedure: CORONARY STENT INTERVENTION;  Surgeon: [name], MD;  Location: [place] CV LAB;  Service: Cardiovascular;  Laterality: [name];   EP  IMPLANTABLE DEVICE [name] 06/17/2016   Procedure: BiV Pacemaker upgrade;  Surgeon: [name], MD;  Location: [place] [facility];  Service: Cardiovascular;  Laterality: [name];   HERNIA REPAIR  1980's   INGUINAL HERNIA REPAIR  1945   left side   INSERT / REPLACE / REMOVE PACEMAKER  03/2012   initial placement   INSERT / REPLACE / REMOVE PACEMAKER  05/25/12   "atrial lead change"   LEAD REVISION [name] 05/25/2012   Procedure: LEAD REVISION;  Surgeon: Evans Lance, MD;  Location: [facility] C[facility] LAB;  Service: Cardiovascular;  Laterality: [name];   LEFT HEART CATH AND CORONARY ANGIOGRAPHY [name] 07/21/2017   Procedure: LEFT HEART CATH AND CORONARY ANGIOGRAPHY;  Surgeon: [name], MD;  Location: [place] CV LAB;  Service: Cardiovascular;  Laterality: [name];   LEFT HEART CATH AND CORONARY ANGIOGRAPHY [name] 12/13/2018   Procedure: LEFT HEART CATH AND CORONARY ANGIOGRAPHY;  Surgeon: [name], MD;  Location: [place] CV LAB;  Service: Cardiovascular;  Laterality: [name];   PERCUTANEOUS CORONARY STENT INTERVENTION (PCI-S) [name] 06/02/2012   Procedure: PERCUTANEOUS CORONARY STENT INTERVENTION (PCI-S);  Surgeon: Hillary Bow, MD;  Location: [facility] CATH LAB;  Service: Cardiovascular;  Laterality: [name];   PROSTATECTOMY  [id number]   COMPLICATED   SHOULDER SURGERY     Bilateral; "boney spurs removed"   SKIN CANCER EXCISION     "in situ; right chest"   TONSILLECTOMY AND ADENOIDECTOMY     "as a child"   Social History   Occupational History   Occupation: Physician    Comment: Retired - ENT  Tobacco Use   Smoking status: Never Smoker   Smokeless tobacco: Never Used  Substance and Sexual Activity   Alcohol use: Yes    Alcohol/week: 3.0 standard drinks    Types: 3 Standard drinks or equivalent per week    Comment: drinks alcohol at least once every 3 months 05/25/12 "last alcohol 02/2012"   Drug use: No   Sexual activity: Not Currently

## 2019-07-14 NOTE — Telephone Encounter (Signed)
[name] is requesting all of his records from [place] to pick up.  I received his record request on 07/14/2019.  I have printed all of his records and faxed to the [facility] office for Mel to pick up on 07/14/2019 at 2:36pm.

## 2019-07-17 ENCOUNTER — Ambulatory Visit (INDEPENDENT_AMBULATORY_CARE_PROVIDER_SITE_OTHER): Payer: Medicare Other | Admitting: *Deleted

## 2019-07-17 DIAGNOSIS — I428 Other cardiomyopathies: Secondary | ICD-10-CM

## 2019-07-17 DIAGNOSIS — I495 Sick sinus syndrome: Secondary | ICD-10-CM

## 2019-07-17 LAB — CUP PACEART REMOTE DEVICE CHECK
Battery Remaining Longevity: 81 mo
Battery Voltage: 2.97 V
Brady Statistic AP VP Percent: 74.18 %
Brady Statistic AP VS Percent: 0.28 %
Brady Statistic AS VP Percent: 19.81 %
Brady Statistic AS VS Percent: 5.62 %
Brady Statistic RA Percent Paced: 20.2 %
Brady Statistic RV Percent Paced: 98.36 %
Date Time Interrogation Session: 20200831054956
Implantable Lead Implant Date: 20130517
Implantable Lead Implant Date: 20130710
Implantable Lead Implant Date: 20170802
Implantable Lead Location: 753858
Implantable Lead Location: 753859
Implantable Lead Location: 753860
Implantable Pulse Generator Implant Date: 20170802
Lead Channel Impedance Value: 1045 Ohm
Lead Channel Impedance Value: 1121 Ohm
Lead Channel Impedance Value: 1159 Ohm
Lead Channel Impedance Value: 361 Ohm
Lead Channel Impedance Value: 380 Ohm
Lead Channel Impedance Value: 456 Ohm
Lead Channel Impedance Value: 494 Ohm
Lead Channel Impedance Value: 532 Ohm
Lead Channel Impedance Value: 551 Ohm
Lead Channel Impedance Value: 570 Ohm
Lead Channel Impedance Value: 665 Ohm
Lead Channel Impedance Value: 893 Ohm
Lead Channel Impedance Value: 893 Ohm
Lead Channel Impedance Value: 988 Ohm
Lead Channel Pacing Threshold Amplitude: 0.75 V
Lead Channel Pacing Threshold Amplitude: 1 V
Lead Channel Pacing Threshold Amplitude: 1.375 V
Lead Channel Pacing Threshold Pulse Width: 0.4 ms
Lead Channel Pacing Threshold Pulse Width: 0.4 ms
Lead Channel Pacing Threshold Pulse Width: 0.8 ms
Lead Channel Sensing Intrinsic Amplitude: 1.375 mV
Lead Channel Sensing Intrinsic Amplitude: 1.375 mV
Lead Channel Sensing Intrinsic Amplitude: 13.75 mV
Lead Channel Sensing Intrinsic Amplitude: 13.75 mV
Lead Channel Setting Pacing Amplitude: 1.75 V
Lead Channel Setting Pacing Amplitude: 2 V
Lead Channel Setting Pacing Amplitude: 2.5 V
Lead Channel Setting Pacing Pulse Width: 0.4 ms
Lead Channel Setting Pacing Pulse Width: 1 ms
Lead Channel Setting Sensing Sensitivity: 4 mV

## 2019-07-26 ENCOUNTER — Encounter: Payer: Self-pay | Admitting: Cardiology

## 2019-07-26 ENCOUNTER — Ambulatory Visit (INDEPENDENT_AMBULATORY_CARE_PROVIDER_SITE_OTHER): Payer: Medicare Other | Admitting: *Deleted

## 2019-07-26 DIAGNOSIS — L209 Atopic dermatitis, unspecified: Secondary | ICD-10-CM | POA: Diagnosis not present

## 2019-07-26 NOTE — Progress Notes (Signed)
Remote pacemaker transmission.   

## 2019-08-09 ENCOUNTER — Other Ambulatory Visit: Payer: Self-pay

## 2019-08-09 ENCOUNTER — Ambulatory Visit (INDEPENDENT_AMBULATORY_CARE_PROVIDER_SITE_OTHER): Payer: Medicare Other | Admitting: *Deleted

## 2019-08-09 DIAGNOSIS — L209 Atopic dermatitis, unspecified: Secondary | ICD-10-CM | POA: Diagnosis not present

## 2019-08-21 ENCOUNTER — Other Ambulatory Visit: Payer: Self-pay

## 2019-08-21 ENCOUNTER — Other Ambulatory Visit: Payer: Medicare Other | Admitting: *Deleted

## 2019-08-21 DIAGNOSIS — E785 Hyperlipidemia, unspecified: Secondary | ICD-10-CM

## 2019-08-21 LAB — HEPATIC FUNCTION PANEL
ALT: 13 IU/L (ref 0–44)
AST: 24 IU/L (ref 0–40)
Albumin: 4.6 g/dL (ref 3.7–4.7)
Alkaline Phosphatase: 109 IU/L (ref 39–117)
Bilirubin Total: 1.6 mg/dL — ABNORMAL HIGH (ref 0.0–1.2)
Bilirubin, Direct: 0.36 mg/dL (ref 0.00–0.40)
Total Protein: 6.6 g/dL (ref 6.0–8.5)

## 2019-08-21 LAB — LIPID PANEL
Chol/HDL Ratio: 2.3 ratio (ref 0.0–5.0)
Cholesterol, Total: 156 mg/dL (ref 100–199)
HDL: 69 mg/dL (ref >39)
LDL Chol Calc (NIH): 75 mg/dL (ref 0–99)
Triglycerides: 60 mg/dL (ref 0–149)
VLDL Cholesterol Cal: 12 mg/dL (ref 5–40)

## 2019-08-22 ENCOUNTER — Telehealth: Payer: Self-pay | Admitting: Pharmacist

## 2019-08-22 ENCOUNTER — Telehealth: Payer: Self-pay | Admitting: *Deleted

## 2019-08-22 DIAGNOSIS — E785 Hyperlipidemia, unspecified: Secondary | ICD-10-CM

## 2019-08-22 MED ORDER — REPATHA SURECLICK 140 MG/ML ~~LOC~~ SOAJ: 1.0000 "pen " | SUBCUTANEOUS | 11 refills | Status: DC

## 2019-08-22 NOTE — Telephone Encounter (Signed)
Patient called to give FYI. He has been trying to get his VA benefits to pay for Dupixent he is having to pay copay out of pocket.  The VA MD's have gotten records already from Dr Neldon Mc and her Dermatologist but in order for them to approve to pay for medication they are requiring him to go off the [place] for 6-8 weeks and return to be seen by MD so they can reaccess him with his rash present.  He advised that he will advise pharmacy to hold his medication after his injection tomorrow then start back in about 8 weeks either way whether they decide to pay or not.  He just wanted Dr Neldon Mc to know what is going on.

## 2019-08-22 NOTE — Addendum Note (Signed)
Addended by: Marcelle Overlie D on: 08/22/2019 10:35 AM   Modules accepted: Orders

## 2019-08-22 NOTE — Telephone Encounter (Signed)
PA approved for Repatha through 08/21/2020. Cost is $22.53  Called patient, left VM to inform him of above

## 2019-08-22 NOTE — Addendum Note (Signed)
Addended by: Marcelle Overlie D on: 08/22/2019 01:15 PM   Modules accepted: Orders

## 2019-08-22 NOTE — Telephone Encounter (Signed)
Called patient to review lipid results. LDL above goal of <70 (75). Patient currently on rosuvastatin 10mg  twice a week and 5mg  all other days. However he is currently having muscle stiffness. We discussed a 2 week drug holiday and then resuming at a lower dose of 5mg  daily (or 5mg  three days a week). In the past he has tried to get PCSK9 inhibitors from [place] but they wanted all the statins he's ever tried with doses and he does not have record of all of those. He knows he has been on rosuvastatin and atrovastatin with muscle stiffness to all statins he's tried. States he has tried other ones, but unsure what they are. He is willing to get through his [employer] part D plan. He is currently in the catastrophic. Will submit PA and follow up with patient.  RxPCN [place] [id number] Group NCPARTD ID [id number]

## 2019-08-22 NOTE — Telephone Encounter (Signed)
Patient provided with information below. Verbally reviewed injection technique. Will continue statin but at lower dose. Follow up labs on 10/21/2019.

## 2019-08-23 ENCOUNTER — Other Ambulatory Visit: Payer: Self-pay

## 2019-08-23 ENCOUNTER — Ambulatory Visit (INDEPENDENT_AMBULATORY_CARE_PROVIDER_SITE_OTHER): Payer: Medicare Other | Admitting: *Deleted

## 2019-08-23 DIAGNOSIS — L209 Atopic dermatitis, unspecified: Secondary | ICD-10-CM

## 2019-09-22 ENCOUNTER — Encounter: Payer: Self-pay | Admitting: *Deleted

## 2019-10-16 ENCOUNTER — Ambulatory Visit (INDEPENDENT_AMBULATORY_CARE_PROVIDER_SITE_OTHER): Payer: Medicare Other | Admitting: *Deleted

## 2019-10-16 DIAGNOSIS — I442 Atrioventricular block, complete: Secondary | ICD-10-CM | POA: Diagnosis not present

## 2019-10-16 LAB — CUP PACEART REMOTE DEVICE CHECK
Battery Remaining Longevity: 76 mo
Battery Voltage: 2.97 V
Brady Statistic AP VP Percent: 77.43 %
Brady Statistic AP VS Percent: 0.21 %
Brady Statistic AS VP Percent: 18.68 %
Brady Statistic AS VS Percent: 3.64 %
Brady Statistic RA Percent Paced: 57.23 %
Brady Statistic RV Percent Paced: 97.19 %
Date Time Interrogation Session: 20201129221415
Implantable Lead Implant Date: 20130517
Implantable Lead Implant Date: 20130710
Implantable Lead Implant Date: 20170802
Implantable Lead Location: 753858
Implantable Lead Location: 753859
Implantable Lead Location: 753860
Implantable Pulse Generator Implant Date: 20170802
Lead Channel Impedance Value: 1026 Ohm
Lead Channel Impedance Value: 1045 Ohm
Lead Channel Impedance Value: 342 Ohm
Lead Channel Impedance Value: 361 Ohm
Lead Channel Impedance Value: 437 Ohm
Lead Channel Impedance Value: 475 Ohm
Lead Channel Impedance Value: 494 Ohm
Lead Channel Impedance Value: 513 Ohm
Lead Channel Impedance Value: 532 Ohm
Lead Channel Impedance Value: 627 Ohm
Lead Channel Impedance Value: 836 Ohm
Lead Channel Impedance Value: 855 Ohm
Lead Channel Impedance Value: 912 Ohm
Lead Channel Impedance Value: 950 Ohm
Lead Channel Pacing Threshold Amplitude: 0.625 V
Lead Channel Pacing Threshold Amplitude: 1 V
Lead Channel Pacing Threshold Amplitude: 1.375 V
Lead Channel Pacing Threshold Pulse Width: 0.4 ms
Lead Channel Pacing Threshold Pulse Width: 0.4 ms
Lead Channel Pacing Threshold Pulse Width: 0.8 ms
Lead Channel Sensing Intrinsic Amplitude: 1.625 mV
Lead Channel Sensing Intrinsic Amplitude: 1.625 mV
Lead Channel Sensing Intrinsic Amplitude: 13.375 mV
Lead Channel Sensing Intrinsic Amplitude: 13.375 mV
Lead Channel Setting Pacing Amplitude: 1.75 V
Lead Channel Setting Pacing Amplitude: 2 V
Lead Channel Setting Pacing Amplitude: 2.5 V
Lead Channel Setting Pacing Pulse Width: 0.4 ms
Lead Channel Setting Pacing Pulse Width: 1 ms
Lead Channel Setting Sensing Sensitivity: 4 mV

## 2019-10-18 ENCOUNTER — Ambulatory Visit (INDEPENDENT_AMBULATORY_CARE_PROVIDER_SITE_OTHER): Payer: Medicare Other

## 2019-10-18 ENCOUNTER — Other Ambulatory Visit: Payer: Self-pay

## 2019-10-18 DIAGNOSIS — L209 Atopic dermatitis, unspecified: Secondary | ICD-10-CM

## 2019-10-31 ENCOUNTER — Other Ambulatory Visit: Payer: Medicare Other

## 2019-10-31 ENCOUNTER — Other Ambulatory Visit: Payer: Self-pay

## 2019-10-31 DIAGNOSIS — E785 Hyperlipidemia, unspecified: Secondary | ICD-10-CM

## 2019-10-31 LAB — HEPATIC FUNCTION PANEL
ALT: 10 IU/L (ref 0–44)
AST: 19 IU/L (ref 0–40)
Albumin: 4.5 g/dL (ref 3.7–4.7)
Alkaline Phosphatase: 132 IU/L — ABNORMAL HIGH (ref 39–117)
Bilirubin Total: 1.7 mg/dL — ABNORMAL HIGH (ref 0.0–1.2)
Bilirubin, Direct: 0.45 mg/dL — ABNORMAL HIGH (ref 0.00–0.40)
Total Protein: 6.8 g/dL (ref 6.0–8.5)

## 2019-10-31 LAB — LIPID PANEL
Chol/HDL Ratio: 1.8 ratio (ref 0.0–5.0)
Cholesterol, Total: 130 mg/dL (ref 100–199)
HDL: 72 mg/dL (ref >39)
LDL Chol Calc (NIH): 46 mg/dL (ref 0–99)
Triglycerides: 51 mg/dL (ref 0–149)
VLDL Cholesterol Cal: 12 mg/dL (ref 5–40)

## 2019-11-01 ENCOUNTER — Telehealth: Payer: Self-pay | Admitting: Internal Medicine

## 2019-11-01 ENCOUNTER — Ambulatory Visit (INDEPENDENT_AMBULATORY_CARE_PROVIDER_SITE_OTHER): Payer: Medicare Other

## 2019-11-01 DIAGNOSIS — L209 Atopic dermatitis, unspecified: Secondary | ICD-10-CM

## 2019-11-01 NOTE — Telephone Encounter (Signed)
Discussed labs with patient. He is concerned with alk phos- suggested he discuss this with his PCP. He states he is taking rosuvastatin 5mg twice a week. He cut back due to aches. Encouraged him to continue tolerated dose of statins. Continue Repatha as well. Lipid labs looks great. 

## 2019-11-01 NOTE — Telephone Encounter (Signed)
Patient would like a call back from the lipid clinic, he states he has some questions about his recent lipid panel he had done.

## 2019-11-08 NOTE — Progress Notes (Signed)
PPM remote 

## 2019-11-16 ENCOUNTER — Other Ambulatory Visit: Payer: Self-pay

## 2019-11-16 ENCOUNTER — Ambulatory Visit (INDEPENDENT_AMBULATORY_CARE_PROVIDER_SITE_OTHER): Payer: Medicare Other

## 2019-11-16 DIAGNOSIS — L209 Atopic dermatitis, unspecified: Secondary | ICD-10-CM

## 2019-11-30 ENCOUNTER — Ambulatory Visit: Payer: Self-pay

## 2019-12-04 ENCOUNTER — Telehealth: Payer: Self-pay

## 2019-12-04 NOTE — Telephone Encounter (Signed)
Please inform patient that self administration of dupilumab is fine.

## 2019-12-04 NOTE — Telephone Encounter (Signed)
Patient called because he received his COVID vaccine. He was told by the staff that he should wait 2 weeks before receiving another injection. He would like to come and pick up his [place] on 12-11-2019 so that he may self administer as he will be out of town when he is allowed to get the [place]. He told me that he would just like to do the self-administer this time. Please advise and thank you.

## 2019-12-04 NOTE — Telephone Encounter (Signed)
Patient notified and will come in this Friday to get teaching.

## 2019-12-04 NOTE — Telephone Encounter (Signed)
Left message to discuss getting Dupixent medication

## 2019-12-07 ENCOUNTER — Ambulatory Visit: Payer: Self-pay

## 2019-12-08 ENCOUNTER — Ambulatory Visit: Payer: Self-pay

## 2019-12-08 ENCOUNTER — Telehealth: Payer: Self-pay | Admitting: *Deleted

## 2019-12-08 NOTE — Telephone Encounter (Signed)
Patient was instructed how to self administer Dupixent and has taken home one injection per Dr. Neldon Mc to administer in between receiving his COVID vaccines. Patient will call back to schedule an appointment for his next injection to receive in our office.

## 2019-12-11 ENCOUNTER — Encounter: Payer: Self-pay | Admitting: Cardiovascular Disease

## 2019-12-11 ENCOUNTER — Other Ambulatory Visit: Payer: Self-pay

## 2019-12-11 ENCOUNTER — Ambulatory Visit (INDEPENDENT_AMBULATORY_CARE_PROVIDER_SITE_OTHER): Payer: Medicare Other | Admitting: Cardiovascular Disease

## 2019-12-11 VITALS — BP 122/76 | HR 79 | Ht 72.0 in | Wt 192.8 lb

## 2019-12-11 DIAGNOSIS — E782 Mixed hyperlipidemia: Secondary | ICD-10-CM | POA: Diagnosis not present

## 2019-12-11 DIAGNOSIS — I4819 Other persistent atrial fibrillation: Secondary | ICD-10-CM | POA: Diagnosis not present

## 2019-12-11 DIAGNOSIS — I25118 Atherosclerotic heart disease of native coronary artery with other forms of angina pectoris: Secondary | ICD-10-CM | POA: Diagnosis not present

## 2019-12-11 NOTE — Patient Instructions (Signed)
Medication Instructions:  Your provider recommends that you continue on your current medications as directed. Please refer to the Current Medication list given to you today.   *If you need a refill on your cardiac medications before your next appointment, please call your pharmacy*   Follow-Up: At [facility], you and your health needs are our priority.  As part of our continuing mission to provide you with exceptional heart care, we have created designated Provider Care Teams.  These Care Teams include your primary Cardiologist (physician) and Advanced Practice Providers (APPs -  Physician Assistants and Nurse Practitioners) who all work together to provide you with the care you need, when you need it. Your next appointment:   6 month(s) The format for your next appointment:   In Person Provider:   You may see Michael Cooper, MD or one of the following Advanced Practice Providers on your designated Care Team:    Scott Weaver, PA-C  Vin Bhagat, PA-C  Janine Hammond, NP   

## 2019-12-11 NOTE — Progress Notes (Signed)
Cardiology Office Note:    Date:  12/11/2019   ID:  Walter Mt, MD, DOB [birth year], MRN [id number]  PCP:  [name], MD  Cardiologist:  [name], MD  Electrophysiologist:  Virl Axe, MD   Referring MD: [name], MD   Chief Complaint  Patient presents with  . Coronary Artery Disease    History of Present Illness:    Walter Mt, MD is a 74 y.o. male with a hx of coronary artery disease, presenting for follow-up evaluation.  The patient has been followed by Dr. Caryl Comes for atrial fibrillation and he has a history of A. fib ablation.  He has undergone stenting of the LAD and left circumflex in the past.  He was found to have a new lesion at the time of his most recent heart catheterization 1 year ago (January 2020).  He again underwent PCI using a drug-eluting stent.  He has been maintained on a combination of apixaban for atrial fibrillation and clopidogrel for coronary artery disease.  He denies any serious bleeding problems.  He does have occasional epistaxis.  He denies excessive bruising.  The patient has had no recent chest pain or shortness of breath.  Overall he feels well.  He has noted that his blood pressure has increased slightly over the last few months with readings now generally in the 120s over 70s.  Past Medical History:  Diagnosis Date  . Atrial flutter -atypical 2007   ABLATION x2 [facility]  . Atrial tachycardia ([place]) 2007   ABLATION [facility]  . Benign paroxysmal vertigo   . CAD (coronary artery disease)    a. abnl mv 01/2012;  b. cath 05/23/2012 LCX 80@ bifurcation w/ OM, 95% @ AV groove, otw nonobs dzs, EF 55-65%;  . Carcinoma ([place])    skin cancer "off my chest"  . Familial tremor   . First degree AV block    had 4 ablasions that left py with heart block  . History of blood transfusion 2010   "2nd day after prostatectomy"  . Hyperlipidemia   . Hypertension   . Incontinence    DECREASING INCONTINENCE RELATED TO PROSTATECTOMY.  . Migraines   . Presence  of permanent cardiac pacemaker 2013 ?  Marland Kitchen Prostate cancer ([place])   . Pulsatile tinnitus   . Tachy-brady syndrome ([place])    a. developed after RFCA @ [facility] 2013-> MDT PPM;  b. s/p RA lead revision 05/2012    Past Surgical History:  Procedure Laterality Date  . ATRIAL ABLATION SURGERY  04/01/12   "4th time I had one"  . CARDIAC CATHETERIZATION    . CARDIOVERSION  12/03/2011   Procedure: CARDIOVERSION;  Surgeon: [name], MD;  Location: [place];  Service: Cardiovascular;  Laterality: [name];  . CARDIOVERSION  01/13/2012   Procedure: CARDIOVERSION;  Surgeon: Lelon Perla, MD;  Location: [place];  Service: Cardiovascular;  Laterality: [name];  . CARDIOVERSION  04/27/2012   Procedure: CARDIOVERSION;  Surgeon: Darlin Coco, MD;  Location: [place];  Service: Cardiovascular;  Laterality: [name];  . COLONOSCOPY    . COLONOSCOPY WITH PROPOFOL [name] 10/29/2016   Procedure: COLONOSCOPY WITH PROPOFOL;  Surgeon: Doran Stabler, MD;  Location: [facility];  Service: Gastroenterology;  Laterality: [name];  . CORONARY ANGIOPLASTY WITH STENT PLACEMENT  06/02/12   "1; first one ever"  . CORONARY STENT INTERVENTION [name] 07/21/2017   Procedure: CORONARY STENT INTERVENTION;  Surgeon: [name], MD;  Location: [place] CV LAB;  Service: Cardiovascular;  Laterality:  [name];  . CORONARY STENT INTERVENTION [name] 12/13/2018   Procedure: CORONARY STENT INTERVENTION;  Surgeon: [name], MD;  Location: [place] [facility];  Service: Cardiovascular;  Laterality: [name];  . EP IMPLANTABLE DEVICE [name] 06/17/2016   Procedure: BiV Pacemaker upgrade;  Surgeon: [name], MD;  Location: [place] [facility];  Service: Cardiovascular;  Laterality: [name];  . HERNIA REPAIR  1980's  . INGUINAL HERNIA REPAIR  1945   left side  . INSERT / REPLACE / REMOVE PACEMAKER  03/2012   initial placement  . INSERT / REPLACE / REMOVE PACEMAKER  05/25/12   "atrial lead change"  . LEAD REVISION [name] 05/25/2012   Procedure: LEAD REVISION;  Surgeon:  Evans Lance, MD;  [facility] C[facility];  Service: Cardiovascular;  Laterality: [name];  . LEFT HEART CATH AND CORONARY ANGIOGRAPHY [name] 07/21/2017   Procedure: LEFT HEART CATH AND CORONARY ANGIOGRAPHY;  Surgeon: [name], MD;  Location: [facility];  Service: Cardiovascular;  Laterality: [name];  . LEFT HEART CATH AND CORONARY ANGIOGRAPHY [name] 12/13/2018   Procedure: LEFT HEART CATH AND CORONARY ANGIOGRAPHY;  Surgeon: [name], MD;  Location: [place] CV LAB;  Service: Cardiovascular;  Laterality: [name];  . PERCUTANEOUS CORONARY STENT INTERVENTION (PCI-S) [name] 06/02/2012   Procedure: PERCUTANEOUS CORONARY STENT INTERVENTION (PCI-S);  Surgeon: Hillary Bow, MD;  Location: [facility] CATH LAB;  Service: Cardiovascular;  Laterality: [name];  . PROSTATECTOMY  11/21/1759   COMPLICATED  . SHOULDER SURGERY     Bilateral; "boney spurs removed"  . SKIN CANCER EXCISION     "in situ; right chest"  . TONSILLECTOMY AND ADENOIDECTOMY     "as a child"    Current Medications: Current Meds  Medication Sig  . apixaban (ELIQUIS) 5 MG TABS tablet Take 1 tablet (5 mg total) by mouth 2 (two) times daily.  . cetirizine (ZYRTEC) 10 MG tablet Take 10 mg by mouth 2 (two) times daily.   . clopidogrel (PLAVIX) 75 MG tablet Take 1 tablet (75 mg total) by mouth daily with breakfast.  . Dupilumab (DUPIXENT [place]) Inject 1 Dose into the skin every 14 (fourteen) days.  Marland Kitchen EPINEPHrine 0.3 mg/0.3 mL IJ SOAJ injection Inject 0.3 mLs into the skin 1 (one) time orthopaedic injection.  . Evolocumab (REPATHA SURECLICK) 607 MG/ML SOAJ Inject 1 pen into the skin every 14 (fourteen) days.  . famotidine (PEPCID) 20 MG tablet Take 20 mg by mouth 2 (two) times daily.  Marland Kitchen losartan (COZAAR) 25 MG tablet Take 25 mg by mouth daily.  . metroNIDAZOLE (METROGEL) 0.75 % gel Apply 1 application topically as needed.  . montelukast (SINGULAIR) 10 MG tablet Take 1 tablet (10 mg total) by mouth at bedtime.  . nitroGLYCERIN (NITROSTAT) 0.4 MG SL  tablet Place 0.4 mg under the tongue every 5 (five) minutes as needed for chest pain (x 3 tabs).  . rosuvastatin (CRESTOR) 10 MG tablet Take 0.5 tablets (5 mg total) by mouth 2 (two) times a week.   Current Facility-Administered Medications for the 12/11/19 encounter (Office Visit) with [name], MD  Medication  . dupilumab (DUPIXENT) prefilled syringe 300 mg     Allergies:   Ceclor [cefaclor], Chlorhexidine, and Ace inhibitors   Social History   Socioeconomic History  . Marital status: Married    Spouse name: Not on file  . Number of children: Not on file  . Years of education: Not on file  . Highest education level: Not on file  Occupational History  .  Occupation: Physician    Comment: Retired - ENT  Tobacco Use  . Smoking status: Never Smoker  . Smokeless tobacco: Never Used  Substance and Sexual Activity  . Alcohol use: Yes    Alcohol/week: 3.0 standard drinks    Types: 3 Standard drinks or equivalent per week    Comment: drinks alcohol at least once every 3 months 05/25/12 "last alcohol 02/2012"  . Drug use: No  . Sexual activity: Not Currently  Other Topics Concern  . Not on file  Social History Narrative   Retired ENT   Social Determinants of Health   Financial Resource Strain:   . Difficulty of Paying Living Expenses: Not on file  Food Insecurity:   . Worried About Charity fundraiser in the Last Year: Not on file  . Ran Out of Food in the Last Year: Not on file  Transportation Needs:   . Lack of Transportation (Medical): Not on file  . Lack of Transportation (Non-Medical): Not on file  Physical Activity:   . Days of Exercise per Week: Not on file  . Minutes of Exercise per Session: Not on file  Stress:   . Feeling of Stress : Not on file  Social Connections:   . Frequency of Communication with Friends and Family: Not on file  . Frequency of Social Gatherings with Friends and Family: Not on file  . Attends Religious Services: Not on file  . Active  Member of Clubs or Organizations: Not on file  . Attends Archivist Meetings: Not on file  . Marital Status: Not on file     Family History: The patient's family history includes Colon cancer in his sister; Diabetes in his brother; Heart disease in his brother and father.  ROS:   Please see the history of present illness.    All other systems reviewed and are negative.  EKGs/Labs/Other Studies Reviewed:    The following studies were reviewed today: Cardiac Cath 12/13/2018: Conclusion    Previously placed Prox LAD drug eluting stent is widely patent.  Balloon angioplasty was performed.  Mid LAD to Dist LAD lesion is 95% stenosed.  Dist LAD lesion is 50% stenosed.  Prox Cx to Mid Cx lesion is 10% stenosed.  Dist Cx lesion is 100% stenosed.  Dist RCA lesion is 50% stenosed.  A drug-eluting stent was successfully placed using a STENT RESOLUTE ONYX 2.5X26.  Post intervention, there is a 0% residual stenosis.  There is mild left ventricular systolic dysfunction.  The left ventricular ejection fraction is 50-55% by visual estimate.   1.  Severe mid LAD stenosis treated successfully with PCI using a 2.5 x 26 mm resolute Onyx DES, postdilated to high pressure with a 3.0 mm noncompliant balloon 2.  Continued patency of the stented segment in the proximal LAD with no significant restenosis 3.  Continued patency of the stented segment in the left circumflex with chronic occlusion of the circumflex subbranch collateralized by left to left collaterals 4.  Moderate stenosis of the distal RCA, unchanged from previous studies, estimated at 50% 5.  Mild global LV systolic dysfunction with LVEF estimated at 50 to 55%  Recommendations: Resume apixaban tomorrow, continue aspirin 81 mg daily x1 month, continue clopidogrel 75 mg daily x6 months.  The patient has had multiple ACS events.  If he is tolerating a combination of apixaban and clopidogrel without significant bleeding  problems, it might be reasonable to continue longer than 6 months.   Coronary Findings  Diagnostic Dominance:  Right Left Anterior Descending  Prox LAD lesion 0% stenosed  Previously placed Prox LAD drug eluting stent is widely patent. The lesion is discrete. Proximal LAD stent remains widely patent.  Mid LAD to Dist LAD lesion 95% stenosed  Mid LAD to Dist LAD lesion is 95% stenosed. The lesion is eccentric and irregular. The lesion is calcified. diffuse stenosis  Dist LAD lesion 50% stenosed  Dist LAD lesion is 50% stenosed.  Left Circumflex  Occluded as the AV circumflex arises from the stent site (unable to accurately depict in the diagram)  Collaterals  Dist Cx filled by collaterals from 2nd Mrg.    Prox Cx to Mid Cx lesion 10% stenosed  Prox Cx to Mid Cx lesion is 10% stenosed. The lesion was previously treated using a drug eluting stent over 2 years ago. The AV circumflex is occluded at the stented segment. The stent is widely patent and extends into the OM which divides into 2 branches. The distal circumflex is collateralized from the OM branches. This is unchanged from the last cath study.  Dist Cx lesion 100% stenosed  Dist Cx lesion is 100% stenosed. chronic occlusion, unchanged from prior studies. Circumflex stent remains patent.  Right Coronary Artery  There is mild diffuse disease throughout the vessel.  Dist RCA lesion 50% stenosed  Dist RCA lesion is 50% stenosed. Unchanged from the previous studies in 2013 and 2018  Intervention  Mid LAD to Dist LAD lesion  Stent  CATHETER LAUNCHER 6FREBU 3.5 guide catheter was inserted. Lesion crossed with guidewire using a WIRE COUGAR XT STRL 190CM. Pre-stent angioplasty was performed using a BALLOON SAPPHIRE 2.0X12. A drug-eluting stent was successfully placed using a STENT RESOLUTE ONYX 2.5X26. Post-stent angioplasty was performed using a BALLOON SAPPHIRE [place] 3.0X8. Maximum pressure: 20 atm. After stent deployment, there is no reflow  in the vessel. Intracoronary verapamil was administered. Flow returns to normal, TIMI-3. Angiography is performed and the midportion of the stent appears underexpanded. The stent had initially been postdilated with a 3.0 x 15 mm noncompliant balloon. A 3.0 x 8 mm noncompliant balloon was used to dilate the midportion of the stent to 20 atm on 2 inflations. This greatly improved the angiographic appearance of stent expansion throughout the stent with 0% residual stenosis and TIMI-3 flow at the completion of the procedure.  Post-Intervention Lesion Assessment  The intervention was successful. Pre-interventional TIMI flow is 3. Post-intervention TIMI flow is 3. No complications occurred at this lesion.  There is a 0% residual stenosis post intervention.  Wall Motion  Resting           Echo 12-14-2018: IMPRESSIONS    1. The left ventricle appears to be normal in size, has mild wall thickness 45-50% ejection fraction Spectral Doppler shows indeterminate pattern of diastolic filling.  2. The right ventricle has mildly enlarged size and normal systolic function.  3. Right ventricular systolic pressure is is normal.  4. Moderately dilated left atrial size.  5. Severely dilated right atrial size.  6. Mitral valve regurgitation is trivial by color flow Doppler.  7. Tricuspid regurgitation is mild.  8. There is mild sclerosis of the aortic valve.  9. Pulmonic valve regurgitation is mild by color flow Doppler. 10. The interatrial septum was not well visualized.  EKG:  EKG is ordered today.  The ekg ordered today demonstrates atrial flutter with variable conduction, ventricular pacing  Recent Labs: 12/14/2018: BUN 17; Creatinine, Ser 0.90; Potassium 4.3; Sodium 135 12/19/2018: Hemoglobin 12.7; Platelets 170 10/31/2019:  ALT 10  Recent Lipid Panel    Component Value Date/Time   CHOL 130 10/31/2019 0942   TRIG 51 10/31/2019 0942   HDL 72 10/31/2019 0942   CHOLHDL 1.8 10/31/2019 0942   LDLCALC 46  10/31/2019 0942   LDLDIRECT 130 (H) 09/13/2017 1358    Physical Exam:    VS:  BP 122/76   Pulse 79   Ht 6' (1.829 m)   Wt 192 lb 12.8 oz (87.5 kg)   SpO2 99%   BMI 26.15 kg/m     Wt Readings from Last 3 Encounters:  12/11/19 192 lb 12.8 oz (87.5 kg)  07/13/19 192 lb (87.1 kg)  05/03/19 192 lb 12.8 oz (87.5 kg)     GEN:  Well nourished, well developed in no acute distress HEENT: Normal NECK: No JVD; No carotid bruits LYMPHATICS: No lymphadenopathy CARDIAC: RRR, no murmurs, rubs, gallops RESPIRATORY:  Clear to auscultation without rales, wheezing or rhonchi  ABDOMEN: Soft, non-tender, non-distended MUSCULOSKELETAL:  No edema; No deformity  SKIN: Warm and dry NEUROLOGIC:  Alert and oriented x 3 PSYCHIATRIC:  Normal affect   ASSESSMENT:    1. Coronary artery disease of native artery of native heart with stable angina pectoris ([place])   2. Mixed hyperlipidemia   3. Persistent atrial fibrillation ([facility])    PLAN:    In order of problems listed above:  1. The patient is stable on his current medical program.  He will be continued on long-term apixaban and clopidogrel.  No changes are made today.  He is beta-blocker intolerant. 2. Most recent lipids are excellent with the addition of Repatha.  He has myalgias and leg weakness even with low doses of Crestor.  The highest tolerable dose for him is 5 mg twice weekly and he will continue on this. 3. Continues on apixaban for anticoagulation. Heart rate controlled.   Overall [name] appears stable.  He will continue to monitor his blood pressure and if readings increase to greater than 135/80, he will increase losartan to 50 mg daily.  Otherwise he will continue on his current medications.   Medication Adjustments/Labs and Tests Ordered: Current medicines are reviewed at length with the patient today.  Concerns regarding medicines are outlined above.  Orders Placed This Encounter  Procedures  . EKG 12-Lead   No orders of the  defined types were placed in this encounter.   Patient Instructions  Medication Instructions:  Your provider recommends that you continue on your current medications as directed. Please refer to the Current Medication list given to you today.   *If you need a refill on your cardiac medications before your next appointment, please call your pharmacy*  Follow-Up: At [facility], you and your health needs are our priority.  As part of our continuing mission to provide you with exceptional heart care, we have created designated Provider Care Teams.  These Care Teams include your primary Cardiologist (physician) and Advanced Practice Providers (APPs -  Physician Assistants and Nurse Practitioners) who all work together to provide you with the care you need, when you need it. Your next appointment:   6 month(s) The format for your next appointment:   In Person Provider:   You may see [name], MD or one of the following Advanced Practice Providers on your designated Care Team:    Richardson Dopp, PA-C  Vin Purdy, PA-C  Daune Perch, [place]    Signed, [name], MD  12/11/2019 1:50 PM    [place]  [facility] 

## 2019-12-21 ENCOUNTER — Telehealth: Payer: Self-pay | Admitting: *Deleted

## 2019-12-21 NOTE — Telephone Encounter (Signed)
Called [facility] and they are not aware they are can service patient.  I will be changing patient over to [facility] since [facility] can no longer service patient. Advised patient to reach out to me if he has any questions

## 2020-01-02 ENCOUNTER — Telehealth: Payer: Self-pay

## 2020-01-02 NOTE — Telephone Encounter (Signed)
Patient called and is wanting restart his Dupixent. He said he would like to restart around March 1st. He does have two doses on hand. Please advise.

## 2020-01-02 NOTE — Telephone Encounter (Signed)
L/m for patient to call me  

## 2020-01-02 NOTE — Telephone Encounter (Signed)
Please pass on this note to Tammy to coordinate with [name] about his [place].

## 2020-01-02 NOTE — Telephone Encounter (Signed)
Spoke to patient and he can restart anytime and he will call and make appt. He should get one injection every 14 days and does not need loading unless it has been over 8 weeks and I dont believe  it has

## 2020-01-15 ENCOUNTER — Other Ambulatory Visit: Payer: Self-pay

## 2020-01-15 ENCOUNTER — Ambulatory Visit (INDEPENDENT_AMBULATORY_CARE_PROVIDER_SITE_OTHER): Payer: Medicare Other

## 2020-01-15 ENCOUNTER — Ambulatory Visit (INDEPENDENT_AMBULATORY_CARE_PROVIDER_SITE_OTHER): Payer: Medicare Other | Admitting: *Deleted

## 2020-01-15 DIAGNOSIS — L209 Atopic dermatitis, unspecified: Secondary | ICD-10-CM

## 2020-01-15 DIAGNOSIS — I442 Atrioventricular block, complete: Secondary | ICD-10-CM | POA: Diagnosis not present

## 2020-01-15 LAB — CUP PACEART REMOTE DEVICE CHECK
Battery Remaining Longevity: 71 mo
Battery Voltage: 2.96 V
Brady Statistic AP VP Percent: 75.07 %
Brady Statistic AP VS Percent: 0.28 %
Brady Statistic AS VP Percent: 20.32 %
Brady Statistic AS VS Percent: 4.33 %
Brady Statistic RA Percent Paced: 75.24 %
Brady Statistic RV Percent Paced: 95.38 %
Date Time Interrogation Session: 20210301074659
Implantable Lead Implant Date: 20130517
Implantable Lead Implant Date: 20130710
Implantable Lead Implant Date: 20170802
Implantable Lead Location: 753858
Implantable Lead Location: 753859
Implantable Lead Location: 753860
Implantable Pulse Generator Implant Date: 20170802
Lead Channel Impedance Value: 1026 Ohm
Lead Channel Impedance Value: 1121 Ohm
Lead Channel Impedance Value: 1159 Ohm
Lead Channel Impedance Value: 342 Ohm
Lead Channel Impedance Value: 361 Ohm
Lead Channel Impedance Value: 418 Ohm
Lead Channel Impedance Value: 475 Ohm
Lead Channel Impedance Value: 475 Ohm
Lead Channel Impedance Value: 513 Ohm
Lead Channel Impedance Value: 570 Ohm
Lead Channel Impedance Value: 684 Ohm
Lead Channel Impedance Value: 836 Ohm
Lead Channel Impedance Value: 893 Ohm
Lead Channel Impedance Value: 950 Ohm
Lead Channel Pacing Threshold Amplitude: 0.75 V
Lead Channel Pacing Threshold Amplitude: 1 V
Lead Channel Pacing Threshold Amplitude: 1.375 V
Lead Channel Pacing Threshold Pulse Width: 0.4 ms
Lead Channel Pacing Threshold Pulse Width: 0.4 ms
Lead Channel Pacing Threshold Pulse Width: 0.8 ms
Lead Channel Sensing Intrinsic Amplitude: 0.75 mV
Lead Channel Sensing Intrinsic Amplitude: 0.75 mV
Lead Channel Sensing Intrinsic Amplitude: 11.375 mV
Lead Channel Sensing Intrinsic Amplitude: 11.375 mV
Lead Channel Setting Pacing Amplitude: 1.75 V
Lead Channel Setting Pacing Amplitude: 2 V
Lead Channel Setting Pacing Amplitude: 2.5 V
Lead Channel Setting Pacing Pulse Width: 0.4 ms
Lead Channel Setting Pacing Pulse Width: 1 ms
Lead Channel Setting Sensing Sensitivity: 4 mV

## 2020-01-15 NOTE — Progress Notes (Signed)
Immunotherapy   Patient Details  Name: Walter HOFLAND, [name] MRN: [id number] Date of Birth: 01/03/1946  01/15/2020  [name], [name] restarted Dupixent today. No loading dose per Tammy. Patient received 300mg  and left without waiting. Frequency: Every 2 weeks Epi-Pen: Yes Consent signed and patient instructions given.   Herbie Drape 01/15/2020, 4:31 PM

## 2020-01-16 NOTE — Progress Notes (Signed)
PPM Remote  

## 2020-01-29 ENCOUNTER — Ambulatory Visit (INDEPENDENT_AMBULATORY_CARE_PROVIDER_SITE_OTHER): Payer: Medicare Other

## 2020-01-29 ENCOUNTER — Other Ambulatory Visit: Payer: Self-pay

## 2020-01-29 DIAGNOSIS — L209 Atopic dermatitis, unspecified: Secondary | ICD-10-CM

## 2020-01-31 ENCOUNTER — Telehealth: Payer: Self-pay

## 2020-01-31 MED ORDER — DUPIXENT 300 MG/2ML ~~LOC~~ SOSY: 300.0000 mg | PREFILLED_SYRINGE | SUBCUTANEOUS | 11 refills | Status: DC

## 2020-01-31 NOTE — Addendum Note (Signed)
Addended by: Carin Hock on: 01/31/2020 04:59 PM   Modules accepted: Orders

## 2020-01-31 NOTE — Telephone Encounter (Signed)
[name] with [facility] called requesting a refill on Dupixent.

## 2020-01-31 NOTE — Telephone Encounter (Signed)
Rx sent 

## 2020-02-12 ENCOUNTER — Ambulatory Visit: Payer: Self-pay

## 2020-02-13 ENCOUNTER — Other Ambulatory Visit: Payer: Self-pay

## 2020-02-13 ENCOUNTER — Ambulatory Visit (INDEPENDENT_AMBULATORY_CARE_PROVIDER_SITE_OTHER): Payer: Medicare Other | Admitting: Allergy and Immunology

## 2020-02-13 ENCOUNTER — Encounter: Payer: Self-pay | Admitting: Allergy and Immunology

## 2020-02-13 VITALS — BP 132/86 | HR 75 | Temp 97.4°F | Resp 18

## 2020-02-13 DIAGNOSIS — L299 Pruritus, unspecified: Secondary | ICD-10-CM | POA: Diagnosis not present

## 2020-02-13 DIAGNOSIS — I25118 Atherosclerotic heart disease of native coronary artery with other forms of angina pectoris: Secondary | ICD-10-CM

## 2020-02-13 DIAGNOSIS — L308 Other specified dermatitis: Secondary | ICD-10-CM | POA: Diagnosis not present

## 2020-02-13 DIAGNOSIS — L2089 Other atopic dermatitis: Secondary | ICD-10-CM | POA: Diagnosis not present

## 2020-02-13 DIAGNOSIS — L989 Disorder of the skin and subcutaneous tissue, unspecified: Secondary | ICD-10-CM

## 2020-02-13 NOTE — Progress Notes (Signed)
[place] [place]   Follow-up Note  Referring Provider: [name], MD Primary Provider: [name], MD Date of Office Visit: 02/13/2020  Subjective:   Walter Mt, MD (DOB: May 14, 1946) is a 73 y.o. male who returns to the [facility] on 02/13/2020 in re-evaluation of the following:  HPI: [name] returns to this clinic in evaluation of his atopic dermatitis associated with a pruritic disorder presently treated with dupilumab.  His last visit to this clinic was 27 December 2018.  He has had a dramatic [name] to the administration of dupilumab with almost complete elimination of his pruritic disorder and a very dramatic improvement regarding his skin lesions.  Currently he continues on dupilumab every 2 weeks and uses topical fluocinonide about 2-4 times per week usually applied to ankles, calves, shoulders, and upper arms.  He also continues on cetirizine 10 mg 1 time per day and has eliminated the use of famotidine and montelukast.  He is having some trouble with [facility] regarding his dupilumab.  Apparently with the new year he has been changed to a different specialty pharmacy group managed by [facility] and they apparently do not know what they are doing regarding final approval and distribution of dupilumab for [name].  We have had multiple contacts with their specialty pharmacy and so has [name] and every time he contacts them, which has been greater than 6 times since 2021, he is informed that all the previous information he provided has not been updated in their computer system.  He has received 2 Moderna Covid vaccinations.  Allergies as of 02/13/2020      Reactions   Ceclor [cefaclor] Hives   Chlorhexidine Itching, Rash   Ace Inhibitors Other (See Comments)   Does not take due to "angiotensin is responsible for maintaining bladder sphincter tone"      Medication List      apixaban 5 MG Tabs  tablet Commonly known as: ELIQUIS Take 1 tablet (5 mg total) by mouth 2 (two) times daily.   cetirizine 10 MG tablet Commonly known as: ZYRTEC Take 10 mg by mouth 2 (two) times daily.   clopidogrel 75 MG tablet Commonly known as: PLAVIX Take 1 tablet (75 mg total) by mouth daily with breakfast.   Dupixent 300 MG/2ML prefilled syringe Generic drug: dupilumab Inject 300 mg into the skin every 14 (fourteen) days.   EPINEPHrine 0.3 mg/0.3 mL Soaj injection Commonly known as: EPI-PEN Inject 0.3 mLs into the skin 1 (one) time orthopaedic injection.   famotidine 20 MG tablet Commonly known as: PEPCID Take 20 mg by mouth 2 (two) times daily.   losartan 25 MG tablet Commonly known as: COZAAR Take 25 mg by mouth daily.   metroNIDAZOLE 0.75 % gel Commonly known as: METROGEL Apply 1 application topically as needed.   montelukast 10 MG tablet Commonly known as: SINGULAIR Take 1 tablet (10 mg total) by mouth at bedtime.   nitroGLYCERIN 0.4 MG SL tablet Commonly known as: NITROSTAT Place 0.4 mg under the tongue every 5 (five) minutes as needed for chest pain (x 3 tabs).   Repatha SureClick [id number] MG/ML Soaj Generic drug: Evolocumab Inject 1 pen into the skin every 14 (fourteen) days.   rosuvastatin 10 MG tablet Commonly known as: CRESTOR Take 0.5 tablets (5 mg total) by mouth 2 (two) times a week.       Past Medical History:  Diagnosis Date  . Atrial flutter -atypical 2007  ABLATION x2 [facility]  . Atrial tachycardia ([place]) 2007   ABLATION [facility]  . Benign paroxysmal vertigo   . CAD (coronary artery disease)    a. abnl mv 01/2012;  b. cath 05/23/2012 LCX 80@ bifurcation w/ OM, 95% @ AV groove, otw nonobs dzs, EF 55-65%;  . Carcinoma ([place])    skin cancer "off my chest"  . Familial tremor   . First degree AV block    had 4 ablasions that left py with heart block  . History of blood transfusion 2010   "2nd day after prostatectomy"  . Hyperlipidemia   . Hypertension   .  Incontinence    DECREASING INCONTINENCE RELATED TO PROSTATECTOMY.  . Migraines   . Presence of permanent cardiac pacemaker 2013 ?  Marland Kitchen Prostate cancer ([place])   . Pulsatile tinnitus   . Tachy-brady syndrome ([place])    a. developed after RFCA @ [facility] 2013-> MDT PPM;  b. s/p RA lead revision 05/2012    Past Surgical History:  Procedure Laterality Date  . ATRIAL ABLATION SURGERY  04/01/12   "4th time I had one"  . CARDIAC CATHETERIZATION    . CARDIOVERSION  12/03/2011   Procedure: CARDIOVERSION;  Surgeon: [name], MD;  Location: [place];  Service: Cardiovascular;  Laterality: [name];  . CARDIOVERSION  01/13/2012   Procedure: CARDIOVERSION;  Surgeon: Lelon Perla, MD;  Location: [place];  Service: Cardiovascular;  Laterality: [name];  . CARDIOVERSION  04/27/2012   Procedure: CARDIOVERSION;  Surgeon: Darlin Coco, MD;  Location: [place];  Service: Cardiovascular;  Laterality: [name];  . COLONOSCOPY    . COLONOSCOPY WITH PROPOFOL [name] 10/29/2016   Procedure: COLONOSCOPY WITH PROPOFOL;  Surgeon: Doran Stabler, MD;  [facility];  Service: Gastroenterology;  Laterality: [name];  . CORONARY ANGIOPLASTY WITH STENT PLACEMENT  06/02/12   "1; first one ever"  . CORONARY STENT INTERVENTION [name] 07/21/2017   Procedure: CORONARY STENT INTERVENTION;  Surgeon: [name], MD;  Location: [place] [facility];  Service: Cardiovascular;  Laterality: [name];  . CORONARY STENT INTERVENTION [name] 12/13/2018   Procedure: CORONARY STENT INTERVENTION;  Surgeon: [name], MD;  Location: [place] [facility];  Service: Cardiovascular;  Laterality: [name];  . EP IMPLANTABLE DEVICE [name] 06/17/2016   Procedure: BiV Pacemaker upgrade;  Surgeon: [name], MD;  Location: [place] [facility];  Service: Cardiovascular;  Laterality: [name];  . HERNIA REPAIR  1980's  . INGUINAL HERNIA REPAIR  1945   left side  . INSERT / REPLACE / REMOVE PACEMAKER  03/2012   initial placement  . INSERT / REPLACE / REMOVE PACEMAKER  05/25/12    "atrial lead change"  . LEAD REVISION [name] 05/25/2012   Procedure: LEAD REVISION;  Surgeon: Evans Lance, MD;  Location: [facility] C[facility] LAB;  Service: Cardiovascular;  Laterality: [name];  . LEFT HEART CATH AND CORONARY ANGIOGRAPHY [name] 07/21/2017   Procedure: LEFT HEART CATH AND CORONARY ANGIOGRAPHY;  Surgeon: [name], MD;  Location: [place] CV LAB;  Service: Cardiovascular;  Laterality: [name];  . LEFT HEART CATH AND CORONARY ANGIOGRAPHY [name] 12/13/2018   Procedure: LEFT HEART CATH AND CORONARY ANGIOGRAPHY;  Surgeon: [name], MD;  Location: [place] [facility];  Service: Cardiovascular;  Laterality: [name];  . PERCUTANEOUS CORONARY STENT INTERVENTION (PCI-S) [name] 06/02/2012   Procedure: PERCUTANEOUS CORONARY STENT INTERVENTION (PCI-S);  Surgeon: Hillary Bow, MD;  Location: [facility] C[facility] LAB;  Service: Cardiovascular;  Laterality: [name];  . PROSTATECTOMY  [id number]   COMPLICATED  .  SHOULDER SURGERY     Bilateral; "boney spurs removed"  . SKIN CANCER EXCISION     "in situ; right chest"  . TONSILLECTOMY AND ADENOIDECTOMY     "as a child"    Review of systems negative except as noted in HPI / PMHx or noted below:  Review of Systems  Constitutional: Negative.   HENT: Negative.   Eyes: Negative.   Respiratory: Negative.   Cardiovascular: Negative.   Gastrointestinal: Negative.   Genitourinary: Negative.   Musculoskeletal: Negative.   Skin: Negative.   Neurological: Negative.   Endo/Heme/Allergies: Negative.   Psychiatric/Behavioral: Negative.      Objective:   Vitals:   02/13/20 0908  BP: 132/86  Pulse: 75  Resp: 18  Temp: (!) 97.4 F (36.3 C)  SpO2: 100%          Physical Exam Constitutional:      Appearance: He is not diaphoretic.  HENT:     Head: Normocephalic.     Right Ear: Tympanic membrane, ear canal and external ear normal.     Left Ear: Tympanic membrane, ear canal and external ear normal.     Nose: Nose normal. No mucosal edema or rhinorrhea.     Mouth/Throat:      Pharynx: Uvula midline. No oropharyngeal exudate.  Eyes:     Conjunctiva/sclera: Conjunctivae normal.  Neck:     Thyroid: No thyromegaly.     Trachea: Trachea normal. No tracheal tenderness or tracheal deviation.  Cardiovascular:     Rate and Rhythm: Normal rate and regular rhythm.     Heart sounds: Normal heart sounds, S1 normal and S2 normal. No murmur.  Pulmonary:     Effort: No respiratory distress.     Breath sounds: Normal breath sounds. No stridor. No wheezing or rales.  Lymphadenopathy:     Head:     Right side of head: No tonsillar adenopathy.     Left side of head: No tonsillar adenopathy.     Cervical: No cervical adenopathy.  Skin:    Findings: No erythema or rash.     Nails: There is no clubbing.  Neurological:     Mental Status: He is alert.     Diagnostics:   Assessment and Plan:   1. Other atopic dermatitis   2. Inflammatory dermatosis   3. Pruritic disorder    1.  Continue dupilumab injections every 2 weeks  2.  If needed:   A.  Cetirizine 10 mg - 1 tablet 1-2 times per day  B.  Topical fluocinonide ointment 1-7 times per week  3.  Return to clinic in 1 year or earlier if problem  [name] to the use of dupilumab in regard to his inflammatory condition of his skin and he needs to continue on this form of therapy and hopefully we can get approval from his insurance company to allow him to continue using this biologic agent.  Assuming he does well I will see him back in his clinic in 1 year or earlier if there is a problem.  Allena Katz, MD Allergy / Immunology [place]

## 2020-02-13 NOTE — Patient Instructions (Signed)
  1.  Continue dupilumab injections every 2 weeks  2.  If needed:   A.  Cetirizine 10 mg - 1 tablet 1-2 times per day  B.  Topical fluocinonide ointment 1-7 times per week  3.  Return to clinic in 1 year or earlier if problem

## 2020-02-14 ENCOUNTER — Ambulatory Visit (INDEPENDENT_AMBULATORY_CARE_PROVIDER_SITE_OTHER): Payer: Medicare Other | Admitting: *Deleted

## 2020-02-14 ENCOUNTER — Encounter: Payer: Self-pay | Admitting: Allergy and Immunology

## 2020-02-14 DIAGNOSIS — L209 Atopic dermatitis, unspecified: Secondary | ICD-10-CM | POA: Diagnosis not present

## 2020-02-28 ENCOUNTER — Other Ambulatory Visit: Payer: Self-pay

## 2020-02-28 ENCOUNTER — Ambulatory Visit (INDEPENDENT_AMBULATORY_CARE_PROVIDER_SITE_OTHER): Payer: Medicare Other

## 2020-02-28 DIAGNOSIS — L209 Atopic dermatitis, unspecified: Secondary | ICD-10-CM | POA: Diagnosis not present

## 2020-03-13 ENCOUNTER — Ambulatory Visit (INDEPENDENT_AMBULATORY_CARE_PROVIDER_SITE_OTHER): Payer: Medicare Other

## 2020-03-13 ENCOUNTER — Other Ambulatory Visit: Payer: Self-pay

## 2020-03-13 DIAGNOSIS — L209 Atopic dermatitis, unspecified: Secondary | ICD-10-CM

## 2020-03-27 ENCOUNTER — Ambulatory Visit (INDEPENDENT_AMBULATORY_CARE_PROVIDER_SITE_OTHER): Payer: Medicare Other

## 2020-03-27 ENCOUNTER — Other Ambulatory Visit: Payer: Self-pay

## 2020-03-27 DIAGNOSIS — L209 Atopic dermatitis, unspecified: Secondary | ICD-10-CM

## 2020-04-10 ENCOUNTER — Ambulatory Visit (INDEPENDENT_AMBULATORY_CARE_PROVIDER_SITE_OTHER): Payer: Medicare Other

## 2020-04-10 ENCOUNTER — Other Ambulatory Visit: Payer: Self-pay

## 2020-04-10 DIAGNOSIS — L209 Atopic dermatitis, unspecified: Secondary | ICD-10-CM

## 2020-04-17 ENCOUNTER — Ambulatory Visit (INDEPENDENT_AMBULATORY_CARE_PROVIDER_SITE_OTHER): Payer: Medicare Other | Admitting: *Deleted

## 2020-04-17 DIAGNOSIS — I495 Sick sinus syndrome: Secondary | ICD-10-CM | POA: Diagnosis not present

## 2020-04-17 DIAGNOSIS — I428 Other cardiomyopathies: Secondary | ICD-10-CM

## 2020-04-17 LAB — CUP PACEART REMOTE DEVICE CHECK
Battery Remaining Longevity: 65 mo
Battery Voltage: 2.96 V
Brady Statistic AP VP Percent: 76.12 %
Brady Statistic AP VS Percent: 0.23 %
Brady Statistic AS VP Percent: 21.18 %
Brady Statistic AS VS Percent: 2.36 %
Brady Statistic RA Percent Paced: 21.87 %
Brady Statistic RV Percent Paced: 99.06 %
Date Time Interrogation Session: 20210602065207
Implantable Lead Implant Date: 20130517
Implantable Lead Implant Date: 20130710
Implantable Lead Implant Date: 20170802
Implantable Lead Location: 753858
Implantable Lead Location: 753859
Implantable Lead Location: 753860
Implantable Pulse Generator Implant Date: 20170802
Lead Channel Impedance Value: 1026 Ohm
Lead Channel Impedance Value: 1083 Ohm
Lead Channel Impedance Value: 1121 Ohm
Lead Channel Impedance Value: 342 Ohm
Lead Channel Impedance Value: 342 Ohm
Lead Channel Impedance Value: 418 Ohm
Lead Channel Impedance Value: 437 Ohm
Lead Channel Impedance Value: 456 Ohm
Lead Channel Impedance Value: 494 Ohm
Lead Channel Impedance Value: 532 Ohm
Lead Channel Impedance Value: 684 Ohm
Lead Channel Impedance Value: 798 Ohm
Lead Channel Impedance Value: 874 Ohm
Lead Channel Impedance Value: 912 Ohm
Lead Channel Pacing Threshold Amplitude: 0.75 V
Lead Channel Pacing Threshold Amplitude: 1 V
Lead Channel Pacing Threshold Amplitude: 1.375 V
Lead Channel Pacing Threshold Pulse Width: 0.4 ms
Lead Channel Pacing Threshold Pulse Width: 0.4 ms
Lead Channel Pacing Threshold Pulse Width: 0.8 ms
Lead Channel Sensing Intrinsic Amplitude: 1.125 mV
Lead Channel Sensing Intrinsic Amplitude: 1.125 mV
Lead Channel Sensing Intrinsic Amplitude: 11.75 mV
Lead Channel Sensing Intrinsic Amplitude: 11.75 mV
Lead Channel Setting Pacing Amplitude: 1.75 V
Lead Channel Setting Pacing Amplitude: 2 V
Lead Channel Setting Pacing Amplitude: 2.5 V
Lead Channel Setting Pacing Pulse Width: 0.4 ms
Lead Channel Setting Pacing Pulse Width: 1 ms
Lead Channel Setting Sensing Sensitivity: 4 mV

## 2020-04-22 NOTE — Progress Notes (Signed)
Remote pacemaker transmission.   

## 2020-04-24 ENCOUNTER — Other Ambulatory Visit: Payer: Self-pay

## 2020-04-24 ENCOUNTER — Ambulatory Visit (INDEPENDENT_AMBULATORY_CARE_PROVIDER_SITE_OTHER): Payer: Medicare Other

## 2020-04-24 ENCOUNTER — Other Ambulatory Visit: Payer: Self-pay | Admitting: *Deleted

## 2020-04-24 DIAGNOSIS — L209 Atopic dermatitis, unspecified: Secondary | ICD-10-CM | POA: Diagnosis not present

## 2020-04-24 MED ORDER — DUPIXENT 300 MG/2ML ~~LOC~~ SOSY: 300.0000 mg | PREFILLED_SYRINGE | SUBCUTANEOUS | 11 refills | Status: DC

## 2020-04-24 NOTE — Telephone Encounter (Signed)
Patient had called and advised that his Ins advised [facility] [place] [facility] [address] so I will send Rx and advised patient to let me know if he has any issues

## 2020-04-29 ENCOUNTER — Telehealth: Payer: Self-pay | Admitting: Cardiovascular Disease

## 2020-04-29 MED ORDER — ASPIRIN EC 81 MG PO TBEC: 81.0000 mg | DELAYED_RELEASE_TABLET | Freq: Every day | ORAL | Status: AC

## 2020-04-29 NOTE — Telephone Encounter (Signed)
Returned call to patient who states he is having bruises 2-3 times per week, more than in the past. Got a hematoma yesterday from his Repatha injection. I asked about recent lab work and he denies, is due to see PCP at end of this week. I reviewed the information from Dr. Burt Knack from last office visit 1/21 with patient and he asks that I send message to Dr. Burt Knack for agreement for him to stop clopidogrel. I advised that I will forward message for agreement and that our office will call him back with Dr. Antionette Char advice. Patient verbalized understanding and agreement with plan and thanked me for the call.

## 2020-04-29 NOTE — Telephone Encounter (Signed)
Pt c/o medication issue:  1. Name of Medication: Eliquis and Plavix  2. How are you currently taking this medication (dosage and times per day)? Eliquis 2 times a day and Plavix 1 time a day  3. Are you having a reaction (difficulty breathing--STAT)? No  4. What is your medication issue?  Starting bruising more frequently in the last 2 weeks- no bleeding- just wants to know if he should continue to take both of them?

## 2020-04-29 NOTE — Telephone Encounter (Signed)
Reviewed Dr. Antionette Char advice with patient who verbalized understanding and agreement. He states he will call back if bruising continues after stopping clopidogrel and starting asa 81 mg. He thanked me for the call.

## 2020-04-29 NOTE — Telephone Encounter (Signed)
OK to stop plavix. Would start ASA 81 mg daily if he is ok to try that. If he continues to have problems with this, can go to apixaban alone, I'm just concerned that with no antiplatelet Rx he will be at higher risk of ischemic problems. thx

## 2020-05-08 ENCOUNTER — Ambulatory Visit (INDEPENDENT_AMBULATORY_CARE_PROVIDER_SITE_OTHER): Payer: Medicare Other

## 2020-05-08 ENCOUNTER — Other Ambulatory Visit: Payer: Self-pay

## 2020-05-08 DIAGNOSIS — L209 Atopic dermatitis, unspecified: Secondary | ICD-10-CM | POA: Diagnosis not present

## 2020-05-22 ENCOUNTER — Ambulatory Visit (INDEPENDENT_AMBULATORY_CARE_PROVIDER_SITE_OTHER): Payer: Medicare Other

## 2020-05-22 ENCOUNTER — Other Ambulatory Visit: Payer: Self-pay

## 2020-05-22 DIAGNOSIS — L209 Atopic dermatitis, unspecified: Secondary | ICD-10-CM

## 2020-06-05 ENCOUNTER — Ambulatory Visit: Payer: Self-pay

## 2020-06-07 ENCOUNTER — Telehealth: Payer: Self-pay | Admitting: *Deleted

## 2020-06-07 ENCOUNTER — Ambulatory Visit: Payer: Self-pay

## 2020-06-07 MED ORDER — DUPIXENT 300 MG/2ML ~~LOC~~ SOSY: 300.0000 mg | PREFILLED_SYRINGE | SUBCUTANEOUS | 11 refills | Status: DC

## 2020-06-07 NOTE — Telephone Encounter (Signed)
Patient called and advised that [facility] is back in network and wants to [facility]. Advised [facility] has [facility] for [facility] but then wants to switch back to [facility]. [place] sent

## 2020-06-12 ENCOUNTER — Ambulatory Visit: Payer: Self-pay

## 2020-06-12 ENCOUNTER — Other Ambulatory Visit: Payer: Self-pay

## 2020-06-12 ENCOUNTER — Ambulatory Visit (INDEPENDENT_AMBULATORY_CARE_PROVIDER_SITE_OTHER): Payer: Medicare Other

## 2020-06-12 DIAGNOSIS — L209 Atopic dermatitis, unspecified: Secondary | ICD-10-CM

## 2020-06-26 ENCOUNTER — Other Ambulatory Visit: Payer: Self-pay

## 2020-06-26 ENCOUNTER — Ambulatory Visit (INDEPENDENT_AMBULATORY_CARE_PROVIDER_SITE_OTHER): Payer: Medicare Other

## 2020-06-26 DIAGNOSIS — L209 Atopic dermatitis, unspecified: Secondary | ICD-10-CM | POA: Diagnosis not present

## 2020-07-08 ENCOUNTER — Encounter: Payer: Self-pay | Admitting: Cardiovascular Disease

## 2020-07-08 ENCOUNTER — Ambulatory Visit (INDEPENDENT_AMBULATORY_CARE_PROVIDER_SITE_OTHER): Payer: Medicare Other | Admitting: Cardiovascular Disease

## 2020-07-08 ENCOUNTER — Other Ambulatory Visit: Payer: Self-pay

## 2020-07-08 VITALS — BP 118/76 | HR 80 | Ht 72.0 in | Wt 192.0 lb

## 2020-07-08 DIAGNOSIS — E782 Mixed hyperlipidemia: Secondary | ICD-10-CM

## 2020-07-08 DIAGNOSIS — I4819 Other persistent atrial fibrillation: Secondary | ICD-10-CM | POA: Diagnosis not present

## 2020-07-08 DIAGNOSIS — I25119 Atherosclerotic heart disease of native coronary artery with unspecified angina pectoris: Secondary | ICD-10-CM | POA: Diagnosis not present

## 2020-07-08 NOTE — Progress Notes (Signed)
Cardiology Office Note:    Date:  07/08/2020   ID:  Walter Mt, MD, DOB 12/09/1945, MRN [id number]  PCP:  [name], MD  [facility] Cardiologist:  [name], MD  [facility] Electrophysiologist:  Virl Axe, MD   Referring MD: [name], MD   Chief Complaint  Patient presents with  . Coronary Artery Disease    History of Present Illness:    Walter Mt, MD is a 74 y.o. male with a hx of coronary artery disease, presenting for follow-up evaluation.  The patient has been followed by Dr. Caryl Comes for atrial fibrillation and he has a history of A. fib ablation.  He has undergone stenting of the LAD and left circumflex in the past.  He was found to have a new lesion at the time of his most recent heart catheterization in January 2020.  He again underwent PCI using a drug-eluting stent.  He has been maintained on a combination of apixaban for atrial fibrillation and clopidogrel for coronary artery disease.    The patient is here alone today.  He has been doing well since our last visit.  He has been changed from clopidogrel to low-dose aspirin for antiplatelet therapy.  He was having problems with epistaxis and this is improved since he made the medicine change.  He continues on apixaban for oral anticoagulation.  He states that he actually feels better when he is in atrial fibrillation because he has such frequent ectopy when he is in sinus rhythm.  He still follows intermittently with Dr. Caryl Comes.  He denies any anginal chest pain or pressure.  No shortness of breath.  He has occasional dizziness that he does not relate to his heart.  He states that he has had paroxysmal vertigo in the past.  Past Medical History:  Diagnosis Date  . Atrial flutter -atypical 2007   [facility]LATION x2 [facility]  . Atrial tachycardia ([place]) 2007   ABLATION [facility]  . Benign paroxysmal vertigo   . CAD (coronary artery disease)    a. abnl mv 01/2012;  b. cath 05/23/2012 LCX 80@ bifurcation w/ OM, 95% @ AV  groove, otw nonobs dzs, EF 55-65%;  . Carcinoma ([place])    skin cancer "off my chest"  . Familial tremor   . First degree AV block    had 4 ablasions that left py with heart block  . History of blood transfusion 2010   "2nd day after prostatectomy"  . Hyperlipidemia   . Hypertension   . Incontinence    DECREASING INCONTINENCE RELATED TO PROSTATECTOMY.  . Migraines   . Presence of permanent cardiac pacemaker 2013 ?  [name] Prostate cancer ([place])   . Pulsatile tinnitus   . Tachy-brady syndrome ([place])    a. developed after RFCA @ [facility] 2013-> MDT PPM;  b. s/p RA lead revision 05/2012    Past Surgical History:  Procedure Laterality Date  . ATRIAL ABLATION SURGERY  04/01/12   "4th time I had one"  . CARDIAC CATHETERIZATION    . CARDIOVERSION  12/03/2011   Procedure: CARDIOVERSION;  Surgeon: [name], MD;  Location: [place];  Service: Cardiovascular;  Laterality: [name];  . CARDIOVERSION  01/13/2012   Procedure: CARDIOVERSION;  Surgeon: Lelon Perla, MD;  Location: [place];  Service: Cardiovascular;  Laterality: [name];  . CARDIOVERSION  04/27/2012   Procedure: CARDIOVERSION;  Surgeon: Darlin Coco, MD;  Location: [place];  Service: Cardiovascular;  Laterality: [name];  . COLONOSCOPY    . COLONOSCOPY  WITH PROPOFOL [name] 10/29/2016   Procedure: COLONOSCOPY WITH PROPOFOL;  Surgeon: Doran Stabler, MD;  [facility];  Service: Gastroenterology;  Laterality: [name];  . CORONARY ANGIOPLASTY WITH STENT PLACEMENT  06/02/12   "1; first one ever"  . CORONARY STENT INTERVENTION [name] 07/21/2017   Procedure: CORONARY STENT INTERVENTION;  Surgeon: [name], MD;  Location: [place] CV LAB;  Service: Cardiovascular;  Laterality: [name];  . CORONARY STENT INTERVENTION [name] 12/13/2018   Procedure: CORONARY STENT INTERVENTION;  Surgeon: [name], MD;  Location: [place] CV LAB;  Service: Cardiovascular;  Laterality: [name];  . EP IMPLANTABLE DEVICE [name] 06/17/2016   Procedure: BiV Pacemaker upgrade;   Surgeon: [name], MD;  Location: [place] CV LAB;  Service: Cardiovascular;  Laterality: [name];  . HERNIA REPAIR  1980's  . INGUINAL HERNIA REPAIR  1945   left side  . INSERT / REPLACE / REMOVE PACEMAKER  03/2012   initial placement  . INSERT / REPLACE / REMOVE PACEMAKER  05/25/12   "atrial lead change"  . LEAD REVISION [name] 05/25/2012   Procedure: LEAD REVISION;  Surgeon: Evans Lance, MD;  Location: [facility] CATH LAB;  Service: Cardiovascular;  Laterality: [name];  . LEFT HEART CATH AND CORONARY ANGIOGRAPHY [name] 07/21/2017   Procedure: LEFT HEART CATH AND CORONARY ANGIOGRAPHY;  Surgeon: [name], MD;  Location: [place] CV LAB;  Service: Cardiovascular;  Laterality: [name];  . LEFT HEART CATH AND CORONARY ANGIOGRAPHY [name] 12/13/2018   Procedure: LEFT HEART CATH AND CORONARY ANGIOGRAPHY;  Surgeon: [name], MD;  Location: [place] CV LAB;  Service: Cardiovascular;  Laterality: [name];  . PERCUTANEOUS CORONARY STENT INTERVENTION (PCI-S) [name] 06/02/2012   Procedure: PERCUTANEOUS CORONARY STENT INTERVENTION (PCI-S);  Surgeon: Hillary Bow, MD;  Location: [facility] CATH LAB;  Service: Cardiovascular;  Laterality: [name];  . PROSTATECTOMY  11/21/1094   COMPLICATED  . SHOULDER SURGERY     Bilateral; "boney spurs removed"  . SKIN CANCER EXCISION     "in situ; right chest"  . TONSILLECTOMY AND ADENOIDECTOMY     "as a child"    Current Medications: Current Meds  Medication Sig  . acyclovir (ZOVIRAX) 400 MG tablet Take 400 mg by mouth as needed.  [name] apixaban (ELIQUIS) 5 MG TABS tablet Take 1 tablet (5 mg total) by mouth 2 (two) times daily.  [name] aspirin EC 81 MG tablet Take 1 tablet (81 mg total) by mouth daily. Swallow whole.  . cetirizine (ZYRTEC) 10 MG tablet Take 10 mg by mouth daily.   . dupilumab (DUPIXENT) 300 MG/2ML prefilled syringe Inject 300 mg into the skin every 14 (fourteen) days.  [name] EPINEPHrine 0.3 mg/0.3 mL IJ SOAJ injection Inject 0.3 mLs into the skin 1 (one) time orthopaedic  injection.  . Evolocumab (REPATHA SURECLICK) 045 MG/ML SOAJ Inject 1 pen into the skin every 14 (fourteen) days.  [name] losartan (COZAAR) 25 MG tablet Take 25 mg by mouth daily.  . metroNIDAZOLE (METROGEL) 0.75 % gel Apply 1 application topically as needed.  . nitroGLYCERIN (NITROSTAT) 0.4 MG SL tablet Place 0.4 mg under the tongue every 5 (five) minutes as needed for chest pain (x 3 tabs).  . rosuvastatin (CRESTOR) 10 MG tablet Take 0.5 tablets (5 mg total) by mouth 2 (two) times a week.   Current Facility-Administered Medications for the 07/08/20 encounter (Office Visit) with [name], MD  Medication  . dupilumab (DUPIXENT) prefilled syringe 300 mg     Allergies:   Ceclor [cefaclor], Chlorhexidine, and  Ace inhibitors   Social History   Socioeconomic History  . Marital status: Married    Spouse name: Not on file  . Number of children: Not on file  . Years of education: Not on file  . Highest education level: Not on file  Occupational History  . Occupation: Physician    Comment: Retired - ENT  Tobacco Use  . Smoking status: Never Smoker  . Smokeless tobacco: Never Used  Vaping Use  . Vaping Use: Never used  Substance and Sexual Activity  . Alcohol use: Yes    Alcohol/week: 3.0 standard drinks    Types: 3 Standard drinks or equivalent per week    Comment: drinks alcohol at least once every 3 months 05/25/12 "last alcohol 02/2012"  . Drug use: No  . Sexual activity: Not Currently  Other Topics Concern  . Not on file  Social History Narrative   Retired ENT   Social Determinants of Health   Financial Resource Strain:   . Difficulty of Paying Living Expenses: Not on file  Food Insecurity:   . Worried About Charity fundraiser in the Last Year: Not on file  . Ran Out of Food in the Last Year: Not on file  Transportation Needs:   . Lack of Transportation (Medical): Not on file  . Lack of Transportation (Non-Medical): Not on file  Physical Activity:   . Days of Exercise  per Week: Not on file  . Minutes of Exercise per Session: Not on file  Stress:   . Feeling of Stress : Not on file  Social Connections:   . Frequency of Communication with Friends and Family: Not on file  . Frequency of Social Gatherings with Friends and Family: Not on file  . Attends Religious Services: Not on file  . Active Member of Clubs or Organizations: Not on file  . Attends Archivist Meetings: Not on file  . Marital Status: Not on file     Family History: The patient's family history includes Colon cancer in his sister; Diabetes in his brother; Heart disease in his brother and father.  ROS:   Please see the history of present illness.    All other systems reviewed and are negative.  EKGs/Labs/Other Studies Reviewed:    The following studies were reviewed today: Echo 12/14/2018: IMPRESSIONS    1. The left ventricle appears to be normal in size, has mild wall  thickness 45-50% ejection fraction Spectral Doppler shows indeterminate  pattern of diastolic filling.  2. The right ventricle has mildly enlarged size and normal systolic  function.  3. Right ventricular systolic pressure is is normal.  4. Moderately dilated left atrial size.  5. Severely dilated right atrial size.  6. Mitral valve regurgitation is trivial by color flow Doppler.  7. Tricuspid regurgitation is mild.  8. There is mild sclerosis of the aortic valve.  9. Pulmonic valve regurgitation is mild by color flow Doppler.  10. The interatrial septum was not well visualized.   EKG:  EKG is not ordered today.    Recent Labs: 10/31/2019: ALT 10  Recent Lipid Panel    Component Value Date/Time   CHOL 130 10/31/2019 0942   TRIG 51 10/31/2019 0942   HDL 72 10/31/2019 0942   CHOLHDL 1.8 10/31/2019 0942   LDLCALC 46 10/31/2019 0942   LDLDIRECT 130 (H) 09/13/2017 1358    Physical Exam:    VS:  BP 118/76   Pulse 80   Ht 6' (1.829 m)  Wt 192 lb (87.1 kg)   SpO2 98%   BMI 26.04  kg/m     Wt Readings from Last 3 Encounters:  07/08/20 192 lb (87.1 kg)  12/11/19 192 lb 12.8 oz (87.5 kg)  07/13/19 192 lb (87.1 kg)     GEN:  Well nourished, well developed in no acute distress HEENT: Normal NECK: No JVD; No carotid bruits LYMPHATICS: No lymphadenopathy CARDIAC: irregular, no murmurs, rubs, gallops RESPIRATORY:  Clear to auscultation without rales, wheezing or rhonchi  ABDOMEN: Soft, non-tender, non-distended MUSCULOSKELETAL:  No edema; No deformity  SKIN: Warm and dry NEUROLOGIC:  Alert and oriented x 3 PSYCHIATRIC:  Normal affect   ASSESSMENT:    1. Coronary artery disease involving native coronary artery of native heart with angina pectoris (HCC)   2. Persistent atrial fibrillation ([place])   3. Mixed hyperlipidemia    PLAN:    In order of problems listed above:  1. The patient is doing well with no recurrence of angina since his last PCI procedure 18 months ago.  He will continue on his current medicine program without change.  I do think he needs to be on long-term antiplatelet therapy with at least an 81 mg aspirin. 2. No symptoms at present.  Continue apixaban for oral anticoagulation. 3. Treated with Repatha.  I reviewed his lipids from December 2020 which are excellent.  HDL cholesterol is 72 and LDL cholesterol is 46.  He is also treated with 5 mg of Crestor twice per week with dose limited by medicine tolerance.  He will continue to follow through the lipid clinic.   Medication Adjustments/Labs and Tests Ordered: Current medicines are reviewed at length with the patient today.  Concerns regarding medicines are outlined above.  No orders of the defined types were placed in this encounter.  No orders of the defined types were placed in this encounter.   Patient Instructions  Medication Instructions:  Your provider recommends that you continue on your current medications as directed. Please refer to the Current Medication list given to you today.    *If you need a refill on your cardiac medications before your next appointment, please call your pharmacy*  Follow-Up: Your provider recommends that you schedule a follow-up appointment in 6 months with Dr. Burt Knack.    Signed, [name], MD  07/08/2020 10:56 AM    [place]

## 2020-07-08 NOTE — Patient Instructions (Signed)
Medication Instructions:  °Your provider recommends that you continue on your current medications as directed. Please refer to the Current Medication list given to you today.   °*If you need a refill on your cardiac medications before your next appointment, please call your pharmacy* ° ° °Follow-Up: °Your provider recommends that you schedule a follow-up appointment in 6 months with Dr. Cooper. °

## 2020-07-10 ENCOUNTER — Ambulatory Visit (INDEPENDENT_AMBULATORY_CARE_PROVIDER_SITE_OTHER): Payer: Medicare Other | Admitting: *Deleted

## 2020-07-10 ENCOUNTER — Other Ambulatory Visit: Payer: Self-pay

## 2020-07-10 DIAGNOSIS — L209 Atopic dermatitis, unspecified: Secondary | ICD-10-CM

## 2020-07-17 ENCOUNTER — Ambulatory Visit (INDEPENDENT_AMBULATORY_CARE_PROVIDER_SITE_OTHER): Payer: Medicare Other | Admitting: *Deleted

## 2020-07-17 DIAGNOSIS — I440 Atrioventricular block, first degree: Secondary | ICD-10-CM | POA: Diagnosis not present

## 2020-07-18 LAB — CUP PACEART REMOTE DEVICE CHECK
Battery Remaining Longevity: 61 mo
Battery Voltage: 2.95 V
Brady Statistic AP VP Percent: 74.8 %
Brady Statistic AP VS Percent: 0.21 %
Brady Statistic AS VP Percent: 22.58 %
Brady Statistic AS VS Percent: 2.31 %
Brady Statistic RA Percent Paced: 34.5 %
Brady Statistic RV Percent Paced: 98.86 %
Date Time Interrogation Session: 20210831223023
Implantable Lead Implant Date: 20130517
Implantable Lead Implant Date: 20130710
Implantable Lead Implant Date: 20170802
Implantable Lead Location: 753858
Implantable Lead Location: 753859
Implantable Lead Location: 753860
Implantable Pulse Generator Implant Date: 20170802
Lead Channel Impedance Value: 1045 Ohm
Lead Channel Impedance Value: 1083 Ohm
Lead Channel Impedance Value: 1121 Ohm
Lead Channel Impedance Value: 342 Ohm
Lead Channel Impedance Value: 361 Ohm
Lead Channel Impedance Value: 418 Ohm
Lead Channel Impedance Value: 456 Ohm
Lead Channel Impedance Value: 475 Ohm
Lead Channel Impedance Value: 494 Ohm
Lead Channel Impedance Value: 532 Ohm
Lead Channel Impedance Value: 703 Ohm
Lead Channel Impedance Value: 798 Ohm
Lead Channel Impedance Value: 855 Ohm
Lead Channel Impedance Value: 874 Ohm
Lead Channel Pacing Threshold Amplitude: 0.75 V
Lead Channel Pacing Threshold Amplitude: 1 V
Lead Channel Pacing Threshold Amplitude: 1.375 V
Lead Channel Pacing Threshold Pulse Width: 0.4 ms
Lead Channel Pacing Threshold Pulse Width: 0.4 ms
Lead Channel Pacing Threshold Pulse Width: 0.8 ms
Lead Channel Sensing Intrinsic Amplitude: 1.25 mV
Lead Channel Sensing Intrinsic Amplitude: 1.25 mV
Lead Channel Sensing Intrinsic Amplitude: 11.875 mV
Lead Channel Sensing Intrinsic Amplitude: 11.875 mV
Lead Channel Setting Pacing Amplitude: 1.75 V
Lead Channel Setting Pacing Amplitude: 2 V
Lead Channel Setting Pacing Amplitude: 2.5 V
Lead Channel Setting Pacing Pulse Width: 0.4 ms
Lead Channel Setting Pacing Pulse Width: 1 ms
Lead Channel Setting Sensing Sensitivity: 4 mV

## 2020-07-19 NOTE — Progress Notes (Signed)
Remote pacemaker transmission.   

## 2020-07-24 ENCOUNTER — Ambulatory Visit (INDEPENDENT_AMBULATORY_CARE_PROVIDER_SITE_OTHER): Payer: Medicare Other

## 2020-07-24 ENCOUNTER — Other Ambulatory Visit: Payer: Self-pay

## 2020-07-24 ENCOUNTER — Other Ambulatory Visit: Payer: Self-pay | Admitting: Cardiovascular Disease

## 2020-07-24 DIAGNOSIS — L209 Atopic dermatitis, unspecified: Secondary | ICD-10-CM | POA: Diagnosis not present

## 2020-08-07 ENCOUNTER — Ambulatory Visit (INDEPENDENT_AMBULATORY_CARE_PROVIDER_SITE_OTHER): Payer: Medicare Other

## 2020-08-07 DIAGNOSIS — L209 Atopic dermatitis, unspecified: Secondary | ICD-10-CM

## 2020-08-21 ENCOUNTER — Other Ambulatory Visit: Payer: Self-pay

## 2020-08-21 ENCOUNTER — Ambulatory Visit (INDEPENDENT_AMBULATORY_CARE_PROVIDER_SITE_OTHER): Payer: Medicare Other | Admitting: *Deleted

## 2020-08-21 DIAGNOSIS — L209 Atopic dermatitis, unspecified: Secondary | ICD-10-CM | POA: Diagnosis not present

## 2020-09-04 ENCOUNTER — Ambulatory Visit (INDEPENDENT_AMBULATORY_CARE_PROVIDER_SITE_OTHER): Payer: Medicare Other | Admitting: *Deleted

## 2020-09-04 ENCOUNTER — Other Ambulatory Visit: Payer: Self-pay

## 2020-09-04 DIAGNOSIS — L209 Atopic dermatitis, unspecified: Secondary | ICD-10-CM | POA: Diagnosis not present

## 2020-09-18 ENCOUNTER — Ambulatory Visit: Payer: Self-pay

## 2020-09-25 ENCOUNTER — Ambulatory Visit (INDEPENDENT_AMBULATORY_CARE_PROVIDER_SITE_OTHER): Payer: Medicare Other | Admitting: *Deleted

## 2020-09-25 DIAGNOSIS — L209 Atopic dermatitis, unspecified: Secondary | ICD-10-CM | POA: Diagnosis not present

## 2020-09-26 ENCOUNTER — Encounter: Payer: Self-pay | Admitting: Orthopedic Surgery

## 2020-09-26 ENCOUNTER — Ambulatory Visit (INDEPENDENT_AMBULATORY_CARE_PROVIDER_SITE_OTHER): Payer: Medicare Other | Admitting: Orthopedic Surgery

## 2020-09-26 VITALS — Ht 72.0 in | Wt 192.0 lb

## 2020-09-26 DIAGNOSIS — L600 Ingrowing nail: Secondary | ICD-10-CM | POA: Diagnosis not present

## 2020-09-26 DIAGNOSIS — I25119 Atherosclerotic heart disease of native coronary artery with unspecified angina pectoris: Secondary | ICD-10-CM | POA: Diagnosis not present

## 2020-09-26 NOTE — Progress Notes (Signed)
Office Visit Note   [name], MD           Date of Birth: Mar 23, 1946           MRN: [id number] Visit Date: 09/26/2020              Requested by: [name], MD [address] [place],  [place] [address] PCP: [name], MD  Chief Complaint  Patient presents with  . Left Foot - Pain    Ingrown toe nail       HPI: Patient is a 74 year old gentleman who presents with recurrent painful ingrown toenail left great toe.  Patient denies any systemic symptoms denies any drainage.  Assessment & Plan: Visit Diagnoses:  1. Ingrown nail of great toe of left foot     Plan: Nail was excised without complications he will start dressing changes tomorrow.  Follow-Up Instructions: Return if symptoms worsen or fail to improve.   Ortho Exam  Patient is alert, oriented, no adenopathy, well-dressed, normal affect, normal respiratory effort. Examination patient's left foot is neurovascular intact he has good pulses there is no cellulitis no paronychial infection he has a painful ingrown toenail with the medial border of the left great toe.  After informed consent and sterile prepping patient underwent a digital block with 10 cc of 1% lidocaine plain.  After adequate levels anesthesia were obtained the medial border of the great toenail was excised he tolerated this well a sterile dressing was applied.  Imaging: No results found. No images are attached to the encounter.  Labs: Lab Results  Component Value Date   REPTSTATUS 02/25/2009 FINAL 02/25/2009     Lab Results  Component Value Date   ALBUMIN 4.5 10/31/2019   ALBUMIN 4.6 08/21/2019   ALBUMIN 4.4 06/05/2019    Lab Results  Component Value Date   MG 2.1 01/23/2012   MG 2.1 01/22/2012   MG 2.2 01/21/2012   No results found for: VD25OH  No results found for: PREALBUMIN CBC EXTENDED Latest Ref Rng & Units 12/19/2018 12/14/2018 12/12/2018  WBC 3.4 - 10.8 x10E3/uL 6.1 5.1 5.6  RBC 4.14 - 5.80 x10E6/uL  4.13(L) 4.10(L) 4.46  HGB 13.0 - 17.7 g/dL 12.7(L) 12.5(L) 13.4  HCT 37.5 - 51.0 % 36.7(L) 36.6(L) 40.8  PLT 150 - 450 x10E3/uL 170 135(L) 162  NEUTROABS 1.40 - 7.00 x10E3/uL 4.2 - -  LYMPHSABS 0 - 3 x10E3/uL 0.9 - -     Body mass index is 26.04 kg/m.  Orders:  No orders of the defined types were placed in this encounter.  No orders of the defined types were placed in this encounter.    Procedures: No procedures performed  Clinical Data: No additional findings.  ROS:  All other systems negative, except as noted in the HPI. Review of Systems  Objective: Vital Signs: Ht 6' (1.829 m)   Wt 192 lb (87.1 kg)   BMI 26.04 kg/m   Specialty Comments:  No specialty comments available.  PMFS History: Patient Active Problem List   Diagnosis Date Noted  . Unstable angina ([place]) 12/13/2018  . Family history of colon cancer   . Rectal polyp   . Cardiomyopathy, nonischemic ([place]) 06/17/2016  . Malignant tumor of prostate ([place]) 05/11/2016  . Dyspnea on exertion 11/25/2012  . Hyponatremia 09/06/2012  . CAD (coronary artery disease) 05/26/2012  . Pacemaker-Medtronic 04/15/2012  . Benign positional vertigo 03/31/2012  . First degree AV block 03/31/2012  . Persistent atrial fibrillation ([place])  03/31/2012  . Sinus node dysfunction ([place]) 03/31/2012  . Complete heart block ([place])   . 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 Stress incontinence 11/02/2011  . Hyperlipidemia   . Pulsatile tinnitus   . Atrial flutter/fibrillation   . Familial tremor   . Atrial tachycardia ([place])   . Hypertension    Past Medical History:  Diagnosis Date  . Atrial flutter -atypical 2007   ABLATION x2 [facility]  . Atrial tachycardia ([place]) 2007   ABLATION [facility]  . Benign paroxysmal vertigo   . CAD (coronary artery disease)    a. abnl mv 01/2012;  b. cath 05/23/2012 LCX 80@ bifurcation w/ OM, 95% @ AV groove, otw nonobs dzs, EF 55-65%;  . Carcinoma ([place])    skin cancer "off my chest"  .  Familial tremor   . First degree AV block    had 4 ablasions that left py with heart block  . History of blood transfusion 2010   "2nd day after prostatectomy"  . Hyperlipidemia   . Hypertension   . Incontinence    DECREASING INCONTINENCE RELATED TO PROSTATECTOMY.  . Migraines   . Presence of permanent cardiac pacemaker 2013 ?  Marland Kitchen Prostate cancer ([place])   . Pulsatile tinnitus   . Tachy-brady syndrome ([place])    a. developed after RFCA @ [facility] 2013-> MDT PPM;  b. s/p RA lead revision 05/2012    Family History  Problem Relation Age of Onset  . Colon cancer Sister   . Heart disease Father   . Heart disease Brother        only one brother  . Diabetes Brother        only one brother    Past Surgical History:  Procedure Laterality Date  . ATRIAL ABLATION SURGERY  04/01/12   "4th time I had one"  . CARDIAC CATHETERIZATION    . CARDIOVERSION  12/03/2011   Procedure: CARDIOVERSION;  Surgeon: [name], MD;  Location: [facility];  Service: Cardiovascular;  Laterality: [name];  . CARDIOVERSION  01/13/2012   Procedure: CARDIOVERSION;  Surgeon: Lelon Perla, MD;  Location: [facility];  Service: Cardiovascular;  Laterality: [name];  . CARDIOVERSION  04/27/2012   Procedure: CARDIOVERSION;  Surgeon: Darlin Coco, MD;  Location: [facility];  Service: Cardiovascular;  Laterality: [name];  . COLONOSCOPY    . COLONOSCOPY WITH PROPOFOL [name] 10/29/2016   Procedure: COLONOSCOPY WITH PROPOFOL;  Surgeon: Doran Stabler, MD;  [facility];  Service: Gastroenterology;  Laterality: [name];  . CORONARY ANGIOPLASTY WITH STENT PLACEMENT  06/02/12   "1; first one ever"  . CORONARY STENT INTERVENTION [name] 07/21/2017   Procedure: CORONARY STENT INTERVENTION;  Surgeon: [name], MD;  Location: [facility];  Service: Cardiovascular;  Laterality: [name];  . CORONARY STENT INTERVENTION [name] 12/13/2018   Procedure: CORONARY STENT INTERVENTION;  Surgeon: [name], MD;  Location: [facility];  Service:  Cardiovascular;  Laterality: [name];  . EP IMPLANTABLE DEVICE [name] 06/17/2016   Procedure: BiV Pacemaker upgrade;  Surgeon: [name], MD;  Location: [facility];  Service: Cardiovascular;  Laterality: [name];  . HERNIA REPAIR  1980's  . INGUINAL HERNIA REPAIR  1945   left side  . INSERT / REPLACE / REMOVE PACEMAKER  03/2012   initial placement  . INSERT / REPLACE / REMOVE PACEMAKER  05/25/12   "atrial lead change"  . LEAD REVISION [name] 05/25/2012   Procedure: LEAD REVISION;  Surgeon: Evans Lance,  MD;  Location: [facility];  Service: Cardiovascular;  Laterality: [name];  . LEFT HEART CATH AND CORONARY ANGIOGRAPHY [name] 07/21/2017   Procedure: LEFT HEART CATH AND CORONARY ANGIOGRAPHY;  Surgeon: [name], MD;  Location: [facility];  Service: Cardiovascular;  Laterality: [name];  . LEFT HEART CATH AND CORONARY ANGIOGRAPHY [name] 12/13/2018   Procedure: LEFT HEART CATH AND CORONARY ANGIOGRAPHY;  Surgeon: [name], MD;  Location: [facility];  Service: Cardiovascular;  Laterality: [name];  . PERCUTANEOUS CORONARY STENT INTERVENTION (PCI-S) [name] 06/02/2012   Procedure: PERCUTANEOUS CORONARY STENT INTERVENTION (PCI-S);  Surgeon: Hillary Bow, MD;  [facility];  Service: Cardiovascular;  Laterality: [name];  . PROSTATECTOMY  11/20/9456   COMPLICATED  . SHOULDER SURGERY     Bilateral; "boney spurs removed"  . SKIN CANCER EXCISION     "in situ; right chest"  . TONSILLECTOMY AND ADENOIDECTOMY     "as a child"   Social History   Occupational History  . Occupation: Physician    Comment: Retired - ENT  Tobacco Use  . Smoking status: Never Smoker  . Smokeless tobacco: Never Used  Vaping Use  . Vaping Use: Never used  Substance and Sexual Activity  . Alcohol use: Yes    Alcohol/week: 3.0 standard drinks    Types: 3 Standard drinks or equivalent per week    Comment: drinks alcohol at least once every 3 months 05/25/12 "last alcohol 02/2012"  . Drug use: No  . Sexual  activity: Not Currently

## 2020-10-14 ENCOUNTER — Ambulatory Visit: Payer: Self-pay

## 2020-10-15 ENCOUNTER — Ambulatory Visit (INDEPENDENT_AMBULATORY_CARE_PROVIDER_SITE_OTHER): Payer: Medicare Other

## 2020-10-15 ENCOUNTER — Other Ambulatory Visit: Payer: Self-pay

## 2020-10-15 DIAGNOSIS — L209 Atopic dermatitis, unspecified: Secondary | ICD-10-CM | POA: Diagnosis not present

## 2020-10-15 LAB — CUP PACEART REMOTE DEVICE CHECK
Battery Remaining Longevity: 55 mo
Battery Voltage: 2.95 V
Brady Statistic AP VP Percent: 80.55 %
Brady Statistic AP VS Percent: 0.33 %
Brady Statistic AS VP Percent: 16.78 %
Brady Statistic AS VS Percent: 2.27 %
Brady Statistic RA Percent Paced: 9.76 %
Brady Statistic RV Percent Paced: 99.22 %
Date Time Interrogation Session: 20211130221125
Implantable Lead Implant Date: 20130517
Implantable Lead Implant Date: 20130710
Implantable Lead Implant Date: 20170802
Implantable Lead Location: 753858
Implantable Lead Location: 753859
Implantable Lead Location: 753860
Implantable Pulse Generator Implant Date: 20170802
Lead Channel Impedance Value: 1045 Ohm
Lead Channel Impedance Value: 1121 Ohm
Lead Channel Impedance Value: 1121 Ohm
Lead Channel Impedance Value: 323 Ohm
Lead Channel Impedance Value: 361 Ohm
Lead Channel Impedance Value: 399 Ohm
Lead Channel Impedance Value: 456 Ohm
Lead Channel Impedance Value: 475 Ohm
Lead Channel Impedance Value: 475 Ohm
Lead Channel Impedance Value: 494 Ohm
Lead Channel Impedance Value: 722 Ohm
Lead Channel Impedance Value: 779 Ohm
Lead Channel Impedance Value: 817 Ohm
Lead Channel Impedance Value: 855 Ohm
Lead Channel Pacing Threshold Amplitude: 0.75 V
Lead Channel Pacing Threshold Amplitude: 1 V
Lead Channel Pacing Threshold Amplitude: 1.375 V
Lead Channel Pacing Threshold Pulse Width: 0.4 ms
Lead Channel Pacing Threshold Pulse Width: 0.4 ms
Lead Channel Pacing Threshold Pulse Width: 0.8 ms
Lead Channel Sensing Intrinsic Amplitude: 1.375 mV
Lead Channel Sensing Intrinsic Amplitude: 1.375 mV
Lead Channel Sensing Intrinsic Amplitude: 11.875 mV
Lead Channel Sensing Intrinsic Amplitude: 11.875 mV
Lead Channel Setting Pacing Amplitude: 1.75 V
Lead Channel Setting Pacing Amplitude: 2 V
Lead Channel Setting Pacing Amplitude: 2.5 V
Lead Channel Setting Pacing Pulse Width: 0.4 ms
Lead Channel Setting Pacing Pulse Width: 1 ms
Lead Channel Setting Sensing Sensitivity: 4 mV

## 2020-10-16 ENCOUNTER — Ambulatory Visit (INDEPENDENT_AMBULATORY_CARE_PROVIDER_SITE_OTHER): Payer: Medicare Other

## 2020-10-16 DIAGNOSIS — I440 Atrioventricular block, first degree: Secondary | ICD-10-CM

## 2020-10-23 NOTE — Progress Notes (Signed)
Remote pacemaker transmission.   

## 2020-10-30 ENCOUNTER — Other Ambulatory Visit: Payer: Self-pay

## 2020-10-30 ENCOUNTER — Ambulatory Visit (INDEPENDENT_AMBULATORY_CARE_PROVIDER_SITE_OTHER): Payer: Medicare Other

## 2020-10-30 DIAGNOSIS — L209 Atopic dermatitis, unspecified: Secondary | ICD-10-CM | POA: Diagnosis not present

## 2020-11-07 ENCOUNTER — Telehealth: Payer: Self-pay

## 2020-11-07 NOTE — Telephone Encounter (Signed)
Pt receiving a new monitor.

## 2020-11-14 ENCOUNTER — Ambulatory Visit (INDEPENDENT_AMBULATORY_CARE_PROVIDER_SITE_OTHER): Payer: Medicare Other | Admitting: *Deleted

## 2020-11-14 ENCOUNTER — Other Ambulatory Visit: Payer: Self-pay

## 2020-11-14 DIAGNOSIS — L209 Atopic dermatitis, unspecified: Secondary | ICD-10-CM

## 2020-11-27 ENCOUNTER — Ambulatory Visit (INDEPENDENT_AMBULATORY_CARE_PROVIDER_SITE_OTHER): Payer: Medicare Other | Admitting: *Deleted

## 2020-11-27 ENCOUNTER — Other Ambulatory Visit: Payer: Self-pay

## 2020-11-27 DIAGNOSIS — L209 Atopic dermatitis, unspecified: Secondary | ICD-10-CM

## 2020-12-11 ENCOUNTER — Ambulatory Visit (INDEPENDENT_AMBULATORY_CARE_PROVIDER_SITE_OTHER): Payer: Medicare Other

## 2020-12-11 DIAGNOSIS — L209 Atopic dermatitis, unspecified: Secondary | ICD-10-CM

## 2020-12-25 ENCOUNTER — Ambulatory Visit (INDEPENDENT_AMBULATORY_CARE_PROVIDER_SITE_OTHER): Payer: Medicare Other | Admitting: *Deleted

## 2020-12-25 DIAGNOSIS — L209 Atopic dermatitis, unspecified: Secondary | ICD-10-CM | POA: Diagnosis not present

## 2021-01-08 ENCOUNTER — Ambulatory Visit (INDEPENDENT_AMBULATORY_CARE_PROVIDER_SITE_OTHER): Payer: Medicare Other | Admitting: *Deleted

## 2021-01-08 DIAGNOSIS — L209 Atopic dermatitis, unspecified: Secondary | ICD-10-CM

## 2021-01-13 ENCOUNTER — Ambulatory Visit (INDEPENDENT_AMBULATORY_CARE_PROVIDER_SITE_OTHER): Payer: Medicare Other | Admitting: Cardiovascular Disease

## 2021-01-13 ENCOUNTER — Encounter: Payer: Self-pay | Admitting: Cardiovascular Disease

## 2021-01-13 ENCOUNTER — Other Ambulatory Visit: Payer: Self-pay

## 2021-01-13 VITALS — BP 120/92 | HR 74 | Ht 72.0 in | Wt 188.0 lb

## 2021-01-13 DIAGNOSIS — I4819 Other persistent atrial fibrillation: Secondary | ICD-10-CM | POA: Diagnosis not present

## 2021-01-13 DIAGNOSIS — I25118 Atherosclerotic heart disease of native coronary artery with other forms of angina pectoris: Secondary | ICD-10-CM

## 2021-01-13 DIAGNOSIS — E782 Mixed hyperlipidemia: Secondary | ICD-10-CM

## 2021-01-13 NOTE — Progress Notes (Signed)
Cardiology Office Note:    Date:  01/13/2021   ID:  Walter Mt, MD, DOB [birth year], MRN [id number]  PCP:  [name], [place]  Cardiologist:  [name], MD  Advanced Practice Provider:  No care team member to display Electrophysiologist:  Virl Axe, MD       Referring MD: [name], MD   Chief Complaint  Patient presents with  . 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  The patient's most recent PCI procedure was approximately 2 years ago in January 2020 when he underwent stenting of the LAD.  He is here alone today. He is experiencing episodic chest pain but this is not related to physical exertion. He gets on the treadmill or goes for a 20 minute walk and denies any symptoms with that level of exertion.  He denies shortness of breath, edema, or heart palpitations.  He has had no further problems with epistaxis since he discontinued clopidogrel.  He did have a significant hematoma in the thigh after a Repatha injection.  No other bleeding problems reported.  The patient is experiencing discomfort throughout the muscles in his back.  After reading about it, he suspects this is related to [place].  He would like to take a holiday to see if his symptoms improve.  Past Medical History:  Diagnosis Date  . Atrial flutter -atypical 2007   [facility]ATION x2 [facility]  . Atrial tachycardia ([place]) 2007   ABLATION [facility]  . Benign paroxysmal vertigo   . CAD (coronary artery disease)    a. abnl mv 01/2012;  b. cath 05/23/2012 LCX 80@ bifurcation w/ OM, 95% @ AV groove, otw nonobs dzs, EF 55-65%;  . Carcinoma ([place])    skin cancer "off my chest"  . Familial tremor   . First degree AV block    had 4 ablasions that left py with  heart block  . History of blood transfusion 2010   "2nd day after prostatectomy"  . Hyperlipidemia   . Hypertension   . Incontinence    DECREASING INCONTINENCE RELATED TO PROSTATECTOMY.  . Migraines   . Presence of permanent cardiac pacemaker 2013 ?  Marland Kitchen Prostate cancer ([place])   . Pulsatile tinnitus   . Tachy-brady syndrome ([place])    a. developed after RFCA @ [facility] 2013-> MDT PPM;  b. s/p RA lead revision 05/2012    Past Surgical History:  Procedure Laterality Date  . ATRIAL ABLATION SURGERY  04/01/12   "4th time I had one"  . CARDIAC CATHETERIZATION    . CARDIOVERSION  12/03/2011   Procedure: CARDIOVERSION;  Surgeon: [name], MD;  Location: [facility];  Service: Cardiovascular;  Laterality: [name];  . CARDIOVERSION  01/13/2012   Procedure: CARDIOVERSION;  Surgeon: Lelon Perla, MD;  Location: [facility];  Service: Cardiovascular;  Laterality: [name];  . CARDIOVERSION  04/27/2012   Procedure: CARDIOVERSION;  Surgeon: Darlin Coco, MD;  Location: [place];  Service: Cardiovascular;  Laterality: [name];  . COLONOSCOPY    . COLONOSCOPY WITH PROPOFOL [name] 10/29/2016   Procedure: COLONOSCOPY WITH PROPOFOL;  Surgeon: Doran Stabler, MD;  [facility];  Service: Gastroenterology;  Laterality: [name];  . CORONARY ANGIOPLASTY WITH STENT PLACEMENT  06/02/12   "1; first  one ever"  . CORONARY STENT INTERVENTION [name] 07/21/2017   Procedure: CORONARY STENT INTERVENTION;  Surgeon: [name], MD;  Location: [place] [facility];  Service: Cardiovascular;  Laterality: [name];  . CORONARY STENT INTERVENTION [name] 12/13/2018   Procedure: CORONARY STENT INTERVENTION;  Surgeon: [name], MD;  Location: [place] [facility];  Service: Cardiovascular;  Laterality: [name];  . EP IMPLANTABLE DEVICE [name] 06/17/2016   Procedure: BiV Pacemaker upgrade;  Surgeon: [name], MD;  Location: [place] [facility];  Service: Cardiovascular;  Laterality: [name];  . HERNIA REPAIR  1980's  . INGUINAL HERNIA REPAIR  1945   left  side  . INSERT / REPLACE / REMOVE PACEMAKER  03/2012   initial placement  . INSERT / REPLACE / REMOVE PACEMAKER  05/25/12   "atrial lead change"  . LEAD REVISION [name] 05/25/2012   Procedure: LEAD REVISION;  Surgeon: Evans Lance, MD;  [facility];  Service: Cardiovascular;  Laterality: [name];  . LEFT HEART CATH AND CORONARY ANGIOGRAPHY [name] 07/21/2017   Procedure: LEFT HEART CATH AND CORONARY ANGIOGRAPHY;  Surgeon: [name], MD;  Location: [place] [facility];  Service: Cardiovascular;  Laterality: [name];  . LEFT HEART CATH AND CORONARY ANGIOGRAPHY [name] 12/13/2018   Procedure: LEFT HEART CATH AND CORONARY ANGIOGRAPHY;  Surgeon: [name], MD;  Location: [place] [facility];  Service: Cardiovascular;  Laterality: [name];  . PERCUTANEOUS CORONARY STENT INTERVENTION (PCI-S) [name] 06/02/2012   Procedure: PERCUTANEOUS CORONARY STENT INTERVENTION (PCI-S);  Surgeon: Hillary Bow, MD;  Location: [facility] CATH LAB;  Service: Cardiovascular;  Laterality: [name];  . PROSTATECTOMY  02/17/3153   COMPLICATED  . SHOULDER SURGERY     Bilateral; "boney spurs removed"  . SKIN CANCER EXCISION     "in situ; right chest"  . TONSILLECTOMY AND ADENOIDECTOMY     "as a child"    Current Medications: Current Meds  Medication Sig  . acyclovir (ZOVIRAX) 400 MG tablet Take 400 mg by mouth as needed.  Marland Kitchen amoxicillin (AMOXIL) 500 MG capsule 1 capsule  . apixaban (ELIQUIS) 5 MG TABS tablet Take 1 tablet (5 mg total) by mouth 2 (two) times daily.  Marland Kitchen aspirin EC 81 MG tablet Take 1 tablet (81 mg total) by mouth daily. Swallow whole.  . cetirizine (ZYRTEC) 10 MG tablet Take 10 mg by mouth daily.   . dupilumab (DUPIXENT) 300 MG/2ML prefilled syringe Inject 300 mg into the skin every 14 (fourteen) days.  Marland Kitchen EPINEPHrine 0.3 mg/0.3 mL IJ SOAJ injection Inject 0.3 mLs into the skin 1 (one) time orthopaedic injection.  Marland Kitchen losartan (COZAAR) 25 MG tablet Take 25 mg by mouth daily.  . metroNIDAZOLE (METROGEL) 0.75 % gel Apply 1  application topically as needed.  . nitroGLYCERIN (NITROSTAT) 0.4 MG SL tablet Place 0.4 mg under the tongue every 5 (five) minutes as needed for chest pain (x 3 tabs).  . propranolol (INDERAL) 10 MG tablet 1 tablet  . REPATHA SURECLICK 008 MG/ML SOAJ INJECT 1 PEN INTO THE SKIN EVERY 14 DAYS  . rosuvastatin (CRESTOR) 10 MG tablet Take 0.5 tablets (5 mg total) by mouth 2 (two) times a week.   Current Facility-Administered Medications for the 01/13/21 encounter (Office Visit) with [name], MD  Medication  . dupilumab (DUPIXENT) prefilled syringe 300 mg     Allergies:   Ceclor [cefaclor], Chlorhexidine, and Ace inhibitors   Social History   Socioeconomic History  . Marital status: Married    Spouse name: Not on file  .  Number of children: Not on file  . Years of education: Not on file  . Highest education level: Not on file  Occupational History  . Occupation: Physician    Comment: Retired - ENT  Tobacco Use  . Smoking status: Never Smoker  . Smokeless tobacco: Never Used  Vaping Use  . Vaping Use: Never used  Substance and Sexual Activity  . Alcohol use: Yes    Alcohol/week: 3.0 standard drinks    Types: 3 Standard drinks or equivalent per week    Comment: drinks alcohol at least once every 3 months 05/25/12 "last alcohol 02/2012"  . Drug use: No  . Sexual activity: Not Currently  Other Topics Concern  . Not on file  Social History Narrative   Retired ENT   Social Determinants of Radio broadcast assistant Strain: Not on file  Food Insecurity: Not on file  Transportation Needs: Not on file  Physical Activity: Not on file  Stress: Not on file  Social Connections: Not on file     Family History: The patient's family history includes Colon cancer in his sister; Diabetes in his brother; Heart disease in his brother and father.  ROS:   Please see the history of present illness.    All other systems reviewed and are negative.  EKGs/Labs/Other Studies Reviewed:     The following studies were reviewed today: Cardiac Cath 12/13/2018: Conclusion    Previously placed Prox LAD drug eluting stent is widely patent.  Balloon angioplasty was performed.  Mid LAD to Dist LAD lesion is 95% stenosed.  Dist LAD lesion is 50% stenosed.  Prox Cx to Mid Cx lesion is 10% stenosed.  Dist Cx lesion is 100% stenosed.  Dist RCA lesion is 50% stenosed.  A drug-eluting stent was successfully placed using a STENT RESOLUTE ONYX 2.5X26.  Post intervention, there is a 0% residual stenosis.  There is mild left ventricular systolic dysfunction.  The left ventricular ejection fraction is 50-55% by visual estimate.   1.  Severe mid LAD stenosis treated successfully with PCI using a 2.5 x 26 mm resolute Onyx DES, postdilated to high pressure with a 3.0 mm noncompliant balloon 2.  Continued patency of the stented segment in the proximal LAD with no significant restenosis 3.  Continued patency of the stented segment in the left circumflex with chronic occlusion of the circumflex subbranch collateralized by left to left collaterals 4.  Moderate stenosis of the distal RCA, unchanged from previous studies, estimated at 50% 5.  Mild global LV systolic dysfunction with LVEF estimated at 50 to 55%  Recommendations: Resume apixaban tomorrow, continue aspirin 81 mg daily x1 month, continue clopidogrel 75 mg daily x6 months.  The patient has had multiple ACS events.  If he is tolerating a combination of apixaban and clopidogrel without significant bleeding problems, it might be reasonable to continue longer than 6 months.   EKG:  EKG is ordered today.  The ekg ordered today demonstrates ventricular paced rhythm 74 bpm, underlying atrial tachycardia  Recent Labs: No results found for requested labs within last 8760 hours.  Recent Lipid Panel    Component Value Date/Time   CHOL 130 10/31/2019 0942   TRIG 51 10/31/2019 0942   HDL 72 10/31/2019 0942   CHOLHDL 1.8  10/31/2019 0942   LDLCALC 46 10/31/2019 0942   LDLDIRECT 130 (H) 09/13/2017 1358     Risk Assessment/Calculations:    CHA2DS2-VASc Score = 4  This indicates a 4.8% annual risk of stroke. The  patient's score is based upon: CHF History: Yes HTN History: No Diabetes History: No Stroke History: No Vascular Disease History: Yes Age Score: 2 Gender Score: 0      Physical Exam:    VS:  BP (!) 120/92   Pulse 74   Ht 6' (1.829 m)   Wt 188 lb (85.3 kg)   SpO2 99%   BMI 25.50 kg/m     Wt Readings from Last 3 Encounters:  01/13/21 188 lb (85.3 kg)  09/26/20 192 lb (87.1 kg)  07/08/20 192 lb (87.1 kg)     GEN:  Well nourished, well developed in no acute distress HEENT: Normal NECK: No JVD; No carotid bruits LYMPHATICS: No lymphadenopathy CARDIAC: RRR, no murmurs, rubs, gallops RESPIRATORY:  Clear to auscultation without rales, wheezing or rhonchi  ABDOMEN: Soft, non-tender, non-distended MUSCULOSKELETAL:  No edema; No deformity  SKIN: Warm and dry NEUROLOGIC:  Alert and oriented x 3 PSYCHIATRIC:  Normal affect   ASSESSMENT:    1. Persistent atrial fibrillation ([place])   2. Coronary artery disease of native artery of native heart with stable angina pectoris ([place])   3. Mixed hyperlipidemia    PLAN:    In order of problems listed above:  1. Stable with no current symptoms.  Continue oral anticoagulation with apixaban.  Paced rhythm noted. 2. Atypical angina.  Lengthy discussion about his symptoms today.  He has consistently had exertional bandlike discomfort in the past when he has had a significant coronary occlusion.  He is not currently experiencing any exertional chest discomfort and I think we can continue with observation.  No medication changes are made. 3. Last lipids reviewed with an LDL cholesterol of 39 mg/dL.  He will take a Repatha holiday for 6 weeks because of concerns over muscle aching.  Will repeat lipids and LFTs in 3 months.  The patient is also statin  intolerant.  Will revisit after his follow-up labs.    Medication Adjustments/Labs and Tests Ordered: Current medicines are reviewed at length with the patient today.  Concerns regarding medicines are outlined above.  No orders of the defined types were placed in this encounter.  No orders of the defined types were placed in this encounter.   There are no Patient Instructions on file for this visit.   Signed, [name], MD  01/13/2021 9:37 AM    [place]

## 2021-01-13 NOTE — Patient Instructions (Signed)
Medication Instructions:  1) do not take Repatha for 6 or so weeks and let us know how you feel *If you need a refill on your cardiac medications before your next appointment, please call your pharmacy*  Lab Work: Your provider recommends that you return for fasting lab work in 3 months.   If you have labs (blood work) drawn today and your tests are completely normal, you will receive your results only by: Marland Kitchen MyChart Message (if you have MyChart) OR . A paper copy in the mail If you have any lab test that is abnormal or we need to change your treatment, we will call you to review the results.  Follow-Up: At [facility], you and your health needs are our priority.  As part of our continuing mission to provide you with exceptional heart care, we have created designated Provider Care Teams.  These Care Teams include your primary Cardiologist (physician) and Advanced Practice Providers (APPs -  Physician Assistants and Nurse Practitioners) who all work together to provide you with the care you need, when you need it. Your next appointment:  10-12 months The format for your next appointment:   In Person Provider:   You may see [name], MD or one of the following Advanced Practice Providers on your designated Care Team:    Richardson Dopp, PA-C  [place], [place]

## 2021-01-15 ENCOUNTER — Ambulatory Visit (INDEPENDENT_AMBULATORY_CARE_PROVIDER_SITE_OTHER): Payer: Medicare Other

## 2021-01-15 DIAGNOSIS — I440 Atrioventricular block, first degree: Secondary | ICD-10-CM | POA: Diagnosis not present

## 2021-01-17 LAB — CUP PACEART REMOTE DEVICE CHECK
Battery Remaining Longevity: 50 mo
Battery Voltage: 2.95 V
Brady Statistic AP VP Percent: 80.32 %
Brady Statistic AP VS Percent: 0.67 %
Brady Statistic AS VP Percent: 17.54 %
Brady Statistic AS VS Percent: 1.43 %
Brady Statistic RA Percent Paced: 59.6 %
Brady Statistic RV Percent Paced: 98.41 %
Date Time Interrogation Session: 20220302030237
Implantable Lead Implant Date: 20130517
Implantable Lead Implant Date: 20130710
Implantable Lead Implant Date: 20170802
Implantable Lead Location: 753858
Implantable Lead Location: 753859
Implantable Lead Location: 753860
Implantable Pulse Generator Implant Date: 20170802
Lead Channel Impedance Value: 1121 Ohm
Lead Channel Impedance Value: 1178 Ohm
Lead Channel Impedance Value: 1178 Ohm
Lead Channel Impedance Value: 361 Ohm
Lead Channel Impedance Value: 361 Ohm
Lead Channel Impedance Value: 437 Ohm
Lead Channel Impedance Value: 437 Ohm
Lead Channel Impedance Value: 475 Ohm
Lead Channel Impedance Value: 532 Ohm
Lead Channel Impedance Value: 532 Ohm
Lead Channel Impedance Value: 760 Ohm
Lead Channel Impedance Value: 817 Ohm
Lead Channel Impedance Value: 836 Ohm
Lead Channel Impedance Value: 893 Ohm
Lead Channel Pacing Threshold Amplitude: 0.625 V
Lead Channel Pacing Threshold Amplitude: 1 V
Lead Channel Pacing Threshold Amplitude: 1.375 V
Lead Channel Pacing Threshold Pulse Width: 0.4 ms
Lead Channel Pacing Threshold Pulse Width: 0.4 ms
Lead Channel Pacing Threshold Pulse Width: 0.8 ms
Lead Channel Sensing Intrinsic Amplitude: 1.625 mV
Lead Channel Sensing Intrinsic Amplitude: 1.625 mV
Lead Channel Sensing Intrinsic Amplitude: 11.875 mV
Lead Channel Sensing Intrinsic Amplitude: 11.875 mV
Lead Channel Setting Pacing Amplitude: 1.75 V
Lead Channel Setting Pacing Amplitude: 2 V
Lead Channel Setting Pacing Amplitude: 2.5 V
Lead Channel Setting Pacing Pulse Width: 0.4 ms
Lead Channel Setting Pacing Pulse Width: 1 ms
Lead Channel Setting Sensing Sensitivity: 4 mV

## 2021-01-22 ENCOUNTER — Ambulatory Visit (INDEPENDENT_AMBULATORY_CARE_PROVIDER_SITE_OTHER): Payer: Medicare Other

## 2021-01-22 DIAGNOSIS — L209 Atopic dermatitis, unspecified: Secondary | ICD-10-CM

## 2021-01-23 NOTE — Progress Notes (Signed)
Remote pacemaker transmission.   

## 2021-01-31 ENCOUNTER — Telehealth: Payer: Self-pay

## 2021-01-31 NOTE — Telephone Encounter (Signed)
I spoke with the patient and let him know we received his transmission on 01-15-2021. He did not have to do anything. The patient verbalized understanding and thanked me for the call.

## 2021-02-05 ENCOUNTER — Ambulatory Visit (INDEPENDENT_AMBULATORY_CARE_PROVIDER_SITE_OTHER): Payer: Medicare Other

## 2021-02-05 DIAGNOSIS — L209 Atopic dermatitis, unspecified: Secondary | ICD-10-CM

## 2021-02-19 ENCOUNTER — Ambulatory Visit: Payer: Self-pay

## 2021-02-19 ENCOUNTER — Ambulatory Visit (INDEPENDENT_AMBULATORY_CARE_PROVIDER_SITE_OTHER): Payer: Medicare Other

## 2021-02-19 DIAGNOSIS — L209 Atopic dermatitis, unspecified: Secondary | ICD-10-CM

## 2021-03-05 ENCOUNTER — Ambulatory Visit: Payer: Self-pay

## 2021-03-07 ENCOUNTER — Ambulatory Visit (INDEPENDENT_AMBULATORY_CARE_PROVIDER_SITE_OTHER): Payer: Medicare Other

## 2021-03-07 ENCOUNTER — Other Ambulatory Visit: Payer: Self-pay

## 2021-03-07 DIAGNOSIS — L209 Atopic dermatitis, unspecified: Secondary | ICD-10-CM

## 2021-03-20 ENCOUNTER — Other Ambulatory Visit: Payer: Self-pay

## 2021-03-20 ENCOUNTER — Ambulatory Visit (INDEPENDENT_AMBULATORY_CARE_PROVIDER_SITE_OTHER): Payer: Medicare Other | Admitting: *Deleted

## 2021-03-20 DIAGNOSIS — L209 Atopic dermatitis, unspecified: Secondary | ICD-10-CM | POA: Diagnosis not present

## 2021-04-03 ENCOUNTER — Ambulatory Visit (INDEPENDENT_AMBULATORY_CARE_PROVIDER_SITE_OTHER): Payer: Medicare Other | Admitting: Family Medicine

## 2021-04-03 ENCOUNTER — Other Ambulatory Visit: Payer: Self-pay

## 2021-04-03 DIAGNOSIS — L209 Atopic dermatitis, unspecified: Secondary | ICD-10-CM

## 2021-04-15 ENCOUNTER — Other Ambulatory Visit: Payer: Medicare Other | Admitting: *Deleted

## 2021-04-15 ENCOUNTER — Encounter: Payer: Self-pay | Admitting: Allergy and Immunology

## 2021-04-15 ENCOUNTER — Other Ambulatory Visit: Payer: Self-pay

## 2021-04-15 ENCOUNTER — Ambulatory Visit (INDEPENDENT_AMBULATORY_CARE_PROVIDER_SITE_OTHER): Payer: Medicare Other | Admitting: Allergy and Immunology

## 2021-04-15 VITALS — BP 124/72 | HR 74 | Resp 16 | Ht 72.0 in | Wt 184.8 lb

## 2021-04-15 DIAGNOSIS — I25118 Atherosclerotic heart disease of native coronary artery with other forms of angina pectoris: Secondary | ICD-10-CM

## 2021-04-15 DIAGNOSIS — L2089 Other atopic dermatitis: Secondary | ICD-10-CM | POA: Diagnosis not present

## 2021-04-15 DIAGNOSIS — E782 Mixed hyperlipidemia: Secondary | ICD-10-CM

## 2021-04-15 LAB — LIPID PANEL
Chol/HDL Ratio: 1.8 ratio (ref 0.0–5.0)
Cholesterol, Total: 123 mg/dL (ref 100–199)
HDL: 67 mg/dL (ref >39)
LDL Chol Calc (NIH): 45 mg/dL (ref 0–99)
Triglycerides: 42 mg/dL (ref 0–149)
VLDL Cholesterol Cal: 11 mg/dL (ref 5–40)

## 2021-04-15 LAB — HEPATIC FUNCTION PANEL
ALT: 14 IU/L (ref 0–44)
AST: 22 IU/L (ref 0–40)
Albumin: 4.3 g/dL (ref 3.7–4.7)
Alkaline Phosphatase: 110 IU/L (ref 44–121)
Bilirubin Total: 1.1 mg/dL (ref 0.0–1.2)
Bilirubin, Direct: 0.34 mg/dL (ref 0.00–0.40)
Total Protein: 6.3 g/dL (ref 6.0–8.5)

## 2021-04-15 NOTE — Patient Instructions (Addendum)
  1.  Continue dupilumab injections every 2 weeks  2.  If needed:   A.  Cetirizine 10 mg - 1 tablet 1-2 times per day  B.  Topical fluocinonide ointment 1-7 times per week  3.  Return to clinic in 1 year or earlier if problem

## 2021-04-15 NOTE — Progress Notes (Signed)
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 male who returns to the [facility] [place] on 04/15/2021 in re-evaluation of the following:  HPI: Bird presents to this clinic in evaluation of atopic dermatitis and pruritic disorder.  His last visit to this clinic was 13 February 2020.  As long as he remains on dupilumab injections he has complete control of his skin and skin lesions.  He still continues to require a topical steroid about 1 or 2 times per week to his ankles and he still requires the use of cetirizine on most days but has dramatic improvement in control of his inflammatory skin condition while utilizing the dupilumab.  Allergies as of 04/15/2021      Reactions   Ceclor [cefaclor] Hives   Chlorhexidine Itching, Rash   Ace Inhibitors Other (See Comments)   Does not take due to "angiotensin is responsible for maintaining bladder sphincter tone"   Xarelto [rivaroxaban]       Medication List    acyclovir 400 MG tablet Commonly known as: ZOVIRAX Take 400 mg by mouth as needed.   apixaban 5 MG Tabs tablet Commonly known as: ELIQUIS Take 1 tablet (5 mg total) by mouth 2 (two) times daily.   aspirin EC 81 MG tablet Take 1 tablet (81 mg total) by mouth daily. Swallow whole.   cetirizine 10 MG tablet Commonly known as: ZYRTEC Take 10 mg by mouth daily.   Dupixent 300 MG/2ML prefilled syringe Generic drug: dupilumab Inject 300 mg into the skin every 14 (fourteen) days.   EPINEPHrine 0.3 mg/0.3 mL Soaj injection Commonly known as: EPI-PEN Inject 0.3 mLs into the skin 1 (one) time orthopaedic injection.   losartan 25 MG tablet Commonly known as: COZAAR Take 25 mg by mouth daily.   metroNIDAZOLE 0.75 % gel Commonly known as: METROGEL Apply 1 application  topically as needed.   nitroGLYCERIN 0.4 MG SL tablet Commonly known as: NITROSTAT Place 0.4 mg under the tongue every 5 (five) minutes as needed for chest pain (x 3 tabs).   propranolol 10 MG tablet Commonly known as: INDERAL 1 tablet   Repatha SureClick 916 MG/ML Soaj Generic drug: Evolocumab INJECT 1 PEN INTO THE SKIN EVERY 14 DAYS   rosuvastatin 10 MG tablet Commonly known as: CRESTOR Take 0.5 tablets (5 mg total) by mouth 2 (two) times a week.       Past Medical History:  Diagnosis Date  . Atrial flutter -atypical 2007   ABLATION x2 [facility]  . Atrial tachycardia ([place]) 2007   ABLATION [facility]  . Benign paroxysmal vertigo   . CAD (coronary artery disease)    a. abnl mv 01/2012;  b. cath 05/23/2012 LCX 80@ bifurcation w/ OM, 95% @ AV groove, otw nonobs dzs, EF 55-65%;  . Carcinoma ([place])    skin cancer "off my chest"  . Familial tremor   . First degree AV block    had 4 ablasions that left py with heart block  . History of blood transfusion 2010   "2nd day after prostatectomy"  . Hyperlipidemia   . Hypertension   . Incontinence    DECREASING INCONTINENCE RELATED TO PROSTATECTOMY.  . Migraines   . Presence of permanent cardiac pacemaker 2013 ?  Marland Kitchen Prostate cancer ([place])   . Pulsatile tinnitus   .  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 developed after RFCA @ [facility] 2013-> MDT PPM;  b. s/p RA lead revision 05/2012    Past Surgical History:  Procedure Laterality Date  . ATRIAL ABLATION SURGERY  04/01/12   "4th time I had one"  . CARDIAC CATHETERIZATION    . CARDIOVERSION  12/03/2011   Procedure: CARDIOVERSION;  Surgeon: [name], MD;  Location: [place];  Service: Cardiovascular;  Laterality: [name];  . CARDIOVERSION  01/13/2012   Procedure: CARDIOVERSION;  Surgeon: Lelon Perla, MD;  Location: [place];  Service: Cardiovascular;  Laterality: [name];  . CARDIOVERSION  04/27/2012   Procedure: CARDIOVERSION;  Surgeon: Darlin Coco, MD;  Location: [place];  Service: Cardiovascular;   Laterality: [name];  . COLONOSCOPY    . COLONOSCOPY WITH PROPOFOL [name] 10/29/2016   Procedure: COLONOSCOPY WITH PROPOFOL;  Surgeon: Doran Stabler, MD;  [facility];  Service: Gastroenterology;  Laterality: [name];  . CORONARY ANGIOPLASTY WITH STENT PLACEMENT  06/02/12   "1; first one ever"  . CORONARY STENT INTERVENTION [name] 07/21/2017   Procedure: CORONARY STENT INTERVENTION;  Surgeon: [name], MD;  Location: [place] CV LAB;  Service: Cardiovascular;  Laterality: [name];  . CORONARY STENT INTERVENTION [name] 12/13/2018   Procedure: CORONARY STENT INTERVENTION;  Surgeon: [name], MD;  Location: [place] CV LAB;  Service: Cardiovascular;  Laterality: [name];  . EP IMPLANTABLE DEVICE [name] 06/17/2016   Procedure: BiV Pacemaker upgrade;  Surgeon: [name], MD;  Location: [place] CV [facility];  Service: Cardiovascular;  Laterality: [name];  . HERNIA REPAIR  1980's  . INGUINAL HERNIA REPAIR  1945   left side  . INSERT / REPLACE / REMOVE PACEMAKER  03/2012   initial placement  . INSERT / REPLACE / REMOVE PACEMAKER  05/25/12   "atrial lead change"  . LEAD REVISION [name] 05/25/2012   Procedure: LEAD REVISION;  Surgeon: Evans Lance, MD;  Location: [facility] CATH LAB;  Service: Cardiovascular;  Laterality: [name];  . LEFT HEART CATH AND CORONARY ANGIOGRAPHY [name] 07/21/2017   Procedure: LEFT HEART CATH AND CORONARY ANGIOGRAPHY;  Surgeon: [name], MD;  Location: [place] CV LAB;  Service: Cardiovascular;  Laterality: [name];  . LEFT HEART CATH AND CORONARY ANGIOGRAPHY [name] 12/13/2018   Procedure: LEFT HEART CATH AND CORONARY ANGIOGRAPHY;  Surgeon: [name], MD;  Location: [place] CV LAB;  Service: Cardiovascular;  Laterality: [name];  . PERCUTANEOUS CORONARY STENT INTERVENTION (PCI-S) [name] 06/02/2012   Procedure: PERCUTANEOUS CORONARY STENT INTERVENTION (PCI-S);  Surgeon: Hillary Bow, MD;  Location: [facility] CATH LAB;  Service: Cardiovascular;  Laterality: [name];  . PROSTATECTOMY  05/21/1949    COMPLICATED  . SHOULDER SURGERY     Bilateral; "boney spurs removed"  . SKIN CANCER EXCISION     "in situ; right chest"  . TONSILLECTOMY AND ADENOIDECTOMY     "as a child"    Review of systems negative except as noted in HPI / PMHx or noted below:  Review of Systems  Constitutional: Negative.   HENT: Negative.   Eyes: Negative.   Respiratory: Negative.   Cardiovascular: Negative.   Gastrointestinal: Negative.   Genitourinary: Negative.   Musculoskeletal: Negative.   Skin: Negative.   Neurological: Negative.   Endo/Heme/Allergies: Negative.   Psychiatric/Behavioral: Negative.      Objective:   Vitals:   04/15/21 1057  BP: 124/72  Pulse: 74  Resp: 16  SpO2: 98%   Height: 6' (182.9 cm)  Weight: 184 lb 12.8 oz (83.8  kg)   Physical Exam Constitutional:      Appearance: He is not diaphoretic.  HENT:     Head: Normocephalic. No right periorbital erythema or left periorbital erythema.     Right Ear: Ear canal and external ear normal.     Left Ear: Ear canal and external ear normal.     Nose: Nose normal. No mucosal edema or rhinorrhea.     Mouth/Throat:     Pharynx: No oropharyngeal exudate.  Eyes:     General: Lids are normal.     Conjunctiva/sclera: Conjunctivae normal.     Pupils: Pupils are equal, round, and reactive to light.  Neck:     Thyroid: No thyromegaly.     Trachea: Trachea normal. No tracheal deviation.  Cardiovascular:     Rate and Rhythm: Normal rate and regular rhythm.     Heart sounds: Normal heart sounds, S1 normal and S2 normal. No murmur (Split P2) heard.   Pulmonary:     Effort: Pulmonary effort is normal. No respiratory distress.     Breath sounds: No stridor. No wheezing or rales.  Chest:     Chest wall: No tenderness.  Abdominal:     General: There is no distension.     Palpations: Abdomen is soft. There is no mass.     Tenderness: There is no abdominal tenderness. There is no guarding or rebound.  Musculoskeletal:         General: No tenderness.  Lymphadenopathy:     Head:     Right side of head: No tonsillar adenopathy.     Left side of head: No tonsillar adenopathy.     Cervical: No cervical adenopathy.  Skin:    Coloration: Skin is not pale.     Findings: No erythema or rash.     Nails: There is no clubbing.  Neurological:     Mental Status: He is alert.     Diagnostics: none  Assessment and Plan:   1. Other atopic dermatitis      1.  Continue dupilumab injections every 2 weeks  2.  If needed:   A.  Cetirizine 10 mg - 1 tablet 1-2 times per day  B.  Topical fluocinonide ointment 1-7 times per week  3.  Return to clinic in 1 year or earlier if problem  Filip appears to be doing quite well and his response to dupilumab has been pretty amazing and he will continue to utilize this anti-IL-4/IL-13 antibody and we will see him back in this clinic in 1 year or earlier if there is a problem  Allena Katz, MD Allergy / [place]

## 2021-04-16 ENCOUNTER — Ambulatory Visit (INDEPENDENT_AMBULATORY_CARE_PROVIDER_SITE_OTHER): Payer: Medicare Other

## 2021-04-16 ENCOUNTER — Encounter: Payer: Self-pay | Admitting: Allergy and Immunology

## 2021-04-16 DIAGNOSIS — I428 Other cardiomyopathies: Secondary | ICD-10-CM | POA: Diagnosis not present

## 2021-04-17 ENCOUNTER — Telehealth: Payer: Self-pay

## 2021-04-17 ENCOUNTER — Ambulatory Visit (INDEPENDENT_AMBULATORY_CARE_PROVIDER_SITE_OTHER): Payer: Medicare Other | Admitting: *Deleted

## 2021-04-17 ENCOUNTER — Other Ambulatory Visit: Payer: Self-pay

## 2021-04-17 DIAGNOSIS — L209 Atopic dermatitis, unspecified: Secondary | ICD-10-CM

## 2021-04-17 DIAGNOSIS — L2089 Other atopic dermatitis: Secondary | ICD-10-CM

## 2021-04-17 LAB — CUP PACEART REMOTE DEVICE CHECK
Battery Remaining Longevity: 44 mo
Battery Voltage: 2.94 V
Brady Statistic AP VP Percent: 80.52 %
Brady Statistic AP VS Percent: 0.61 %
Brady Statistic AS VP Percent: 17.78 %
Brady Statistic AS VS Percent: 1.02 %
Brady Statistic RA Percent Paced: 44.13 %
Brady Statistic RV Percent Paced: 98.98 %
Date Time Interrogation Session: 20220601003137
Implantable Lead Implant Date: 20130517
Implantable Lead Implant Date: 20130710
Implantable Lead Implant Date: 20170802
Implantable Lead Location: 753858
Implantable Lead Location: 753859
Implantable Lead Location: 753860
Implantable Pulse Generator Implant Date: 20170802
Lead Channel Impedance Value: 1045 Ohm
Lead Channel Impedance Value: 1083 Ohm
Lead Channel Impedance Value: 1083 Ohm
Lead Channel Impedance Value: 342 Ohm
Lead Channel Impedance Value: 361 Ohm
Lead Channel Impedance Value: 437 Ohm
Lead Channel Impedance Value: 456 Ohm
Lead Channel Impedance Value: 475 Ohm
Lead Channel Impedance Value: 494 Ohm
Lead Channel Impedance Value: 513 Ohm
Lead Channel Impedance Value: 665 Ohm
Lead Channel Impedance Value: 817 Ohm
Lead Channel Impedance Value: 874 Ohm
Lead Channel Impedance Value: 874 Ohm
Lead Channel Pacing Threshold Amplitude: 0.75 V
Lead Channel Pacing Threshold Amplitude: 1 V
Lead Channel Pacing Threshold Amplitude: 1.375 V
Lead Channel Pacing Threshold Pulse Width: 0.4 ms
Lead Channel Pacing Threshold Pulse Width: 0.4 ms
Lead Channel Pacing Threshold Pulse Width: 0.8 ms
Lead Channel Sensing Intrinsic Amplitude: 1.25 mV
Lead Channel Sensing Intrinsic Amplitude: 1.25 mV
Lead Channel Sensing Intrinsic Amplitude: 11.875 mV
Lead Channel Sensing Intrinsic Amplitude: 11.875 mV
Lead Channel Setting Pacing Amplitude: 1.75 V
Lead Channel Setting Pacing Amplitude: 2 V
Lead Channel Setting Pacing Amplitude: 2.5 V
Lead Channel Setting Pacing Pulse Width: 0.4 ms
Lead Channel Setting Pacing Pulse Width: 1 ms
Lead Channel Setting Sensing Sensitivity: 4 mV

## 2021-04-17 NOTE — Telephone Encounter (Signed)
Scheduled remote reviewed. Normal device function.   AF burden 50.3%; EGMs suggest A. Flutter - ongoing; V rates controlled. History of A Fib/Flutter and prescribed apixaban  Pt has known history of PAF/AFl.  On Eliquis for [place].  Noted increased in Burden from 30% on prior report to 50% currently.   Spoke with pt, he reports he does not really feel any different lately.  He feels his fatigue is related to age and not AF.  He also has episodes of dizziness which he attributes to Vertigo.    Advised pt he is overdue for OV with SK, last seen in 2019.  He is agreeable to follow-up, will have scheduler call him for appt.

## 2021-04-18 ENCOUNTER — Encounter: Payer: Self-pay | Admitting: Internal Medicine

## 2021-04-18 NOTE — Telephone Encounter (Signed)
error 

## 2021-04-22 ENCOUNTER — Telehealth: Payer: Self-pay | Admitting: Cardiovascular Disease

## 2021-04-22 NOTE — Telephone Encounter (Signed)
[name] is returning [name]'s call from yesterday afternoon in regards to his lab results. Please advise.

## 2021-04-22 NOTE — Telephone Encounter (Signed)
Spoke with patient about lab work and let him know his results were great and continue current plan.  Patient appreciative of call.

## 2021-04-23 ENCOUNTER — Ambulatory Visit: Payer: Self-pay

## 2021-04-30 ENCOUNTER — Other Ambulatory Visit: Payer: Self-pay

## 2021-04-30 ENCOUNTER — Ambulatory Visit (INDEPENDENT_AMBULATORY_CARE_PROVIDER_SITE_OTHER): Payer: Medicare Other | Admitting: Allergy

## 2021-04-30 DIAGNOSIS — L2089 Other atopic dermatitis: Secondary | ICD-10-CM

## 2021-04-30 DIAGNOSIS — L209 Atopic dermatitis, unspecified: Secondary | ICD-10-CM

## 2021-05-09 NOTE — Progress Notes (Signed)
Remote pacemaker transmission.   

## 2021-05-12 ENCOUNTER — Ambulatory Visit (INDEPENDENT_AMBULATORY_CARE_PROVIDER_SITE_OTHER): Payer: Medicare Other | Admitting: Internal Medicine

## 2021-05-12 ENCOUNTER — Encounter: Payer: Self-pay | Admitting: Internal Medicine

## 2021-05-12 ENCOUNTER — Other Ambulatory Visit: Payer: Self-pay

## 2021-05-12 VITALS — BP 118/68 | HR 82 | Ht 72.0 in | Wt 181.0 lb

## 2021-05-12 DIAGNOSIS — I442 Atrioventricular block, complete: Secondary | ICD-10-CM

## 2021-05-12 DIAGNOSIS — Z950 Presence of cardiac pacemaker: Secondary | ICD-10-CM

## 2021-05-12 DIAGNOSIS — I495 Sick sinus syndrome: Secondary | ICD-10-CM | POA: Diagnosis not present

## 2021-05-12 DIAGNOSIS — I4819 Other persistent atrial fibrillation: Secondary | ICD-10-CM

## 2021-05-12 DIAGNOSIS — I25118 Atherosclerotic heart disease of native coronary artery with other forms of angina pectoris: Secondary | ICD-10-CM

## 2021-05-12 DIAGNOSIS — I428 Other cardiomyopathies: Secondary | ICD-10-CM | POA: Diagnosis not present

## 2021-05-12 NOTE — Patient Instructions (Signed)

## 2021-05-12 NOTE — Progress Notes (Signed)
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 He is s/p [facility] 2 at [facility]  he is status post pacemaker for complete heart block. He  is seen now status post CRT P upgrade  Functional status is been quite stable.  There was a recent visit with [facility] regarding chest discomfort.  Felt not to be anginal.  Also some back discomfort possibly related to Repatha.  A 6-week holiday was undertaken without any change and he has resumed his Repatha.  Intercurrently however, he has suffered the tragic loss over 72-hour period of his wife from an acute abdominal catastrophe.  His son has been very positively involved.   He has a history of coronary artery disease with prior stenting; per TDS  It was elected to use long term aspirin and a NOAC and Rivaroxaban was started; no interval bleeding  In the past angina has been triggered typically by emotional events.  He had recurrent symptoms last summer.  He underwent repeat catheterization with a 95% proximal LAD lesion noted.  Treated DES  Changed BP meds ? 2/2 drug reaction to losartan Has had hx of hyponatremia in past with thiazides and concerns of hypokalemia Rx aldactazide>> his Na>>130 w low grade HA, was going to change to aldactone but blood pressures have remained in the 110-130 range.      > DATE TEST EF   1/14    ECHO   EF 55-60 %   1/17    MYOVIEW   EF 47 % No  ischemia  5/17 Echo EF 40-45%   4/18 Echo EF 40-45%   9/18 Cath EF 60-65% LAD 95%>>DES; Lcx Stent Patent  1/20 Echo     1/20 LHC 45-50% LADm 95%>>0 stented   With deteriorating LV function and modest changes in functional status, we undertook CRT upgrade.   Date Cr K Na Hgb  7/17 1.06   14.4   10/18  1.10 4.8 134 14.2   10/19 1.18 4.5 130    It was elected to keep him on chronic antiplatelet therapy in addition to his Rivaroxaban aspirin was selected---    Past Medical History:  Diagnosis Date   Atrial flutter -atypical 2007   ABLATION x2 [facility]   Atrial tachycardia ([place]) 2007   ABLATION [facility]   Benign paroxysmal vertigo    CAD (coronary artery disease)    a. abnl mv 01/2012;  b. cath 05/23/2012 LCX 80@ bifurcation w/ OM, 95% @ AV groove, otw nonobs dzs, EF 55-65%;   Carcinoma ([place])    skin cancer "off my chest"   Familial tremor    First degree AV block    had 4 ablasions that left py with heart block   History of blood transfusion 2010   "2nd day after prostatectomy"   Hyperlipidemia    Hypertension    Incontinence    DECREASING INCONTINENCE RELATED TO PROSTATECTOMY.   Migraines    Presence of permanent cardiac pacemaker 2013 ?   Prostate cancer ([place])    Pulsatile tinnitus    Tachy-brady syndrome ([place])    a. developed after RFCA @ [facility] 2013-> MDT PPM;  b. s/p RA lead revision 05/2012  Past Surgical History:  Procedure Laterality Date   ATRIAL ABLATION SURGERY  04/01/12   "4th time I had one"   CARDIAC CATHETERIZATION     CARDIOVERSION  12/03/2011   Procedure: CARDIOVERSION;  Surgeon: [name], MD;  Location: [place];  Service: Cardiovascular;  Laterality: [name];   CARDIOVERSION  01/13/2012   Procedure: CARDIOVERSION;  Surgeon: Lelon Perla, MD;  Location: [place];  Service: Cardiovascular;  Laterality: [name];   CARDIOVERSION  04/27/2012   Procedure: CARDIOVERSION;  Surgeon: Darlin Coco, MD;  Location: [place];  Service: Cardiovascular;  Laterality: [name];   COLONOSCOPY     COLONOSCOPY WITH PROPOFOL [name] 10/29/2016   Procedure: COLONOSCOPY WITH PROPOFOL;  Surgeon: Doran Stabler, MD;  [facility];  Service: Gastroenterology;  Laterality: [name];   CORONARY ANGIOPLASTY WITH STENT PLACEMENT  06/02/12   "1; first one ever"   CORONARY STENT INTERVENTION  [name] 07/21/2017   Procedure: CORONARY STENT INTERVENTION;  Surgeon: [name], MD;  Location: [place] CV LAB;  Service: Cardiovascular;  Laterality: [name];   CORONARY STENT INTERVENTION [name] 12/13/2018   Procedure: CORONARY STENT INTERVENTION;  Surgeon: [name], MD;  Location: [place] CV LAB;  Service: Cardiovascular;  Laterality: [name];   EP IMPLANTABLE DEVICE [name] 06/17/2016   Procedure: BiV Pacemaker upgrade;  Surgeon: [name], MD;  Location: [place] CV LAB;  Service: Cardiovascular;  Laterality: [name];   HERNIA REPAIR  1980's   INGUINAL HERNIA REPAIR  1945   left side   INSERT / REPLACE / REMOVE PACEMAKER  03/2012   initial placement   INSERT / REPLACE / REMOVE PACEMAKER  05/25/12   "atrial lead change"   LEAD REVISION [name] 05/25/2012   Procedure: LEAD REVISION;  Surgeon: Evans Lance, MD;  Location: [facility] CATH LAB;  Service: Cardiovascular;  Laterality: [name];   LEFT HEART CATH AND CORONARY ANGIOGRAPHY [name] 07/21/2017   Procedure: LEFT HEART CATH AND CORONARY ANGIOGRAPHY;  Surgeon: [name], MD;  Location: [place] CV LAB;  Service: Cardiovascular;  Laterality: [name];   LEFT HEART CATH AND CORONARY ANGIOGRAPHY [name] 12/13/2018   Procedure: LEFT HEART CATH AND CORONARY ANGIOGRAPHY;  Surgeon: [name], MD;  Location: [place] CV LAB;  Service: Cardiovascular;  Laterality: [name];   PERCUTANEOUS CORONARY STENT INTERVENTION (PCI-S) [name] 06/02/2012   Procedure: PERCUTANEOUS CORONARY STENT INTERVENTION (PCI-S);  Surgeon: Hillary Bow, MD;  Location: [facility] CATH LAB;  Service: Cardiovascular;  Laterality: [name];   PROSTATECTOMY  0/01/5596   COMPLICATED   SHOULDER SURGERY     Bilateral; "boney spurs removed"   SKIN CANCER EXCISION     "in situ; right chest"   TONSILLECTOMY AND ADENOIDECTOMY     "as a child"    Current Outpatient Medications  Medication Sig Dispense Refill   acyclovir (ZOVIRAX) 400 MG tablet Take 400 mg by mouth as needed.     apixaban (ELIQUIS) 5 MG  TABS tablet Take 1 tablet (5 mg total) by mouth 2 (two) times daily. 60 tablet 3   aspirin EC 81 MG tablet Take 1 tablet (81 mg total) by mouth daily. Swallow whole.     cetirizine (ZYRTEC) 10 MG tablet Take 10 mg by mouth daily.      dupilumab (DUPIXENT) 300 MG/2ML prefilled syringe Inject 300 mg into the skin every 14 (fourteen) days. 4 mL 11   EPINEPHrine 0.3 mg/0.3 mL IJ SOAJ injection Inject 0.3 mLs into the skin 1 (one) time orthopaedic injection.  losartan (COZAAR) 25 MG tablet Take 25 mg by mouth daily.     metroNIDAZOLE (METROGEL) 0.75 % gel Apply 1 application topically as needed.     nitroGLYCERIN (NITROSTAT) 0.4 MG SL tablet Place 0.4 mg under the tongue every 5 (five) minutes as needed for chest pain (x 3 tabs).     propranolol (INDERAL) 10 MG tablet 1 tablet     REPATHA SURECLICK 595 MG/ML SOAJ INJECT 1 PEN INTO THE SKIN EVERY 14 DAYS 2 mL 11   Current Facility-Administered Medications  Medication Dose Route Frequency Provider Last Rate Last Admin   dupilumab (DUPIXENT) prefilled syringe 300 mg  300 mg Subcutaneous Q14 Days Kozlow, Donnamarie Poag, MD   300 mg at 04/30/21 1132    Allergies  Allergen Reactions   Ceclor [Cefaclor] Hives   Chlorhexidine Itching and Rash   Ace Inhibitors Other (See Comments)    Does not take due to "angiotensin is responsible for maintaining bladder sphincter tone"   Amlodipine     Other reaction(s): lethargy   Xarelto [Rivaroxaban]     Review of Systems negative except from HPI and PMH  Physical Exam  BP 118/68   Pulse 82   Ht 6' (1.829 m)   Wt 181 lb (82.1 kg)   SpO2 98%   BMI 24.55 kg/m  Well developed and well nourished in no acute distress HENT normal Neck supple with JVP-flat Clear Device pocket well healed; without hematoma or erythema.  There is no tethering  Regular rate and rhythm, no gallop 2/6 murmur Abd-soft with active BS No Clubbing cyanosis  edema Skin-warm and dry A & Oriented  Grossly normal sensory and motor  function   ECG  vpacing -/13/42 PVCs  Assessment and  Plan  Atrial flutter/fib  Atrial fibrillation s/p ablation [facility] x2  Premature JUNCTIONAL BEATS/Atrial beatsPVCs  Sinus node dysfunction  Cardiomyopathy-mild-resolved  Pacemaker CRT - [employer]tr[employer]  The patient's device was interrogated.  The information was reviewed. No changes were made in the programming.    Coronary artery disease s/p  DES stenting 2013/2020>>long term dual therapy recommended per Dr Lilly Cove    Hypertension  Grief     Blood pressure is well controlled.  Continue on losartan 25.  Cardiomyopathy is modestly worsened.  We will continue on his Cozaar 25; also has more ectopy on his electrocardiogram but review of his device suggests that the burden is actually quite small.  He has been on Entresto a couple of times before and has not tolerated.  There may be a role for spironolactone there have been multiple discussions in the past regarding beta-blockers.  I will reach out to [facility] and chat with him regarding drug adjustments.  It may also be prudent prior to this to repeat an echocardiogram as there may have been some hibernating myocardium at the time because of the high-grade LAD lesion

## 2021-05-14 ENCOUNTER — Ambulatory Visit (INDEPENDENT_AMBULATORY_CARE_PROVIDER_SITE_OTHER): Payer: Medicare Other

## 2021-05-14 ENCOUNTER — Other Ambulatory Visit: Payer: Self-pay

## 2021-05-14 DIAGNOSIS — L209 Atopic dermatitis, unspecified: Secondary | ICD-10-CM | POA: Diagnosis not present

## 2021-05-14 DIAGNOSIS — L2089 Other atopic dermatitis: Secondary | ICD-10-CM

## 2021-05-28 ENCOUNTER — Ambulatory Visit (INDEPENDENT_AMBULATORY_CARE_PROVIDER_SITE_OTHER): Payer: Medicare Other

## 2021-05-28 ENCOUNTER — Other Ambulatory Visit: Payer: Self-pay

## 2021-05-28 DIAGNOSIS — L2089 Other atopic dermatitis: Secondary | ICD-10-CM

## 2021-05-28 DIAGNOSIS — L209 Atopic dermatitis, unspecified: Secondary | ICD-10-CM | POA: Diagnosis not present

## 2021-05-30 ENCOUNTER — Encounter: Payer: Self-pay | Admitting: Gastroenterology

## 2021-06-09 ENCOUNTER — Other Ambulatory Visit: Payer: Self-pay | Admitting: Allergy and Immunology

## 2021-06-09 NOTE — Telephone Encounter (Signed)
Please advise 

## 2021-06-11 ENCOUNTER — Ambulatory Visit (INDEPENDENT_AMBULATORY_CARE_PROVIDER_SITE_OTHER): Payer: Medicare Other | Admitting: *Deleted

## 2021-06-11 DIAGNOSIS — L209 Atopic dermatitis, unspecified: Secondary | ICD-10-CM | POA: Diagnosis not present

## 2021-06-25 ENCOUNTER — Other Ambulatory Visit: Payer: Self-pay

## 2021-06-25 ENCOUNTER — Ambulatory Visit (INDEPENDENT_AMBULATORY_CARE_PROVIDER_SITE_OTHER): Payer: Medicare Other

## 2021-06-25 DIAGNOSIS — L209 Atopic dermatitis, unspecified: Secondary | ICD-10-CM | POA: Diagnosis not present

## 2021-07-02 ENCOUNTER — Encounter: Payer: Self-pay | Admitting: Gastroenterology

## 2021-07-09 ENCOUNTER — Ambulatory Visit (INDEPENDENT_AMBULATORY_CARE_PROVIDER_SITE_OTHER): Payer: Medicare Other

## 2021-07-09 ENCOUNTER — Other Ambulatory Visit: Payer: Self-pay

## 2021-07-09 DIAGNOSIS — L209 Atopic dermatitis, unspecified: Secondary | ICD-10-CM | POA: Diagnosis not present

## 2021-07-16 ENCOUNTER — Ambulatory Visit (INDEPENDENT_AMBULATORY_CARE_PROVIDER_SITE_OTHER): Payer: Medicare Other

## 2021-07-16 DIAGNOSIS — I495 Sick sinus syndrome: Secondary | ICD-10-CM

## 2021-07-22 LAB — CUP PACEART REMOTE DEVICE CHECK
Battery Remaining Longevity: 42 mo
Battery Voltage: 2.94 V
Brady Statistic AP VP Percent: 72.27 %
Brady Statistic AP VS Percent: 0.64 %
Brady Statistic AS VP Percent: 23.09 %
Brady Statistic AS VS Percent: 3.88 %
Brady Statistic RA Percent Paced: 28.28 %
Brady Statistic RV Percent Paced: 98.34 %
Date Time Interrogation Session: 20220830225553
Implantable Lead Implant Date: 20130517
Implantable Lead Implant Date: 20130710
Implantable Lead Implant Date: 20170802
Implantable Lead Location: 753858
Implantable Lead Location: 753859
Implantable Lead Location: 753860
Implantable Pulse Generator Implant Date: 20170802
Lead Channel Impedance Value: 1083 Ohm
Lead Channel Impedance Value: 1121 Ohm
Lead Channel Impedance Value: 1140 Ohm
Lead Channel Impedance Value: 361 Ohm
Lead Channel Impedance Value: 399 Ohm
Lead Channel Impedance Value: 475 Ohm
Lead Channel Impedance Value: 475 Ohm
Lead Channel Impedance Value: 494 Ohm
Lead Channel Impedance Value: 551 Ohm
Lead Channel Impedance Value: 551 Ohm
Lead Channel Impedance Value: 684 Ohm
Lead Channel Impedance Value: 874 Ohm
Lead Channel Impedance Value: 931 Ohm
Lead Channel Impedance Value: 969 Ohm
Lead Channel Pacing Threshold Amplitude: 0.75 V
Lead Channel Pacing Threshold Amplitude: 1 V
Lead Channel Pacing Threshold Amplitude: 1.375 V
Lead Channel Pacing Threshold Pulse Width: 0.4 ms
Lead Channel Pacing Threshold Pulse Width: 0.4 ms
Lead Channel Pacing Threshold Pulse Width: 0.8 ms
Lead Channel Sensing Intrinsic Amplitude: 1.125 mV
Lead Channel Sensing Intrinsic Amplitude: 1.125 mV
Lead Channel Sensing Intrinsic Amplitude: 12 mV
Lead Channel Sensing Intrinsic Amplitude: 12 mV
Lead Channel Setting Pacing Amplitude: 1.75 V
Lead Channel Setting Pacing Amplitude: 2 V
Lead Channel Setting Pacing Amplitude: 2.5 V
Lead Channel Setting Pacing Pulse Width: 0.4 ms
Lead Channel Setting Pacing Pulse Width: 1 ms
Lead Channel Setting Sensing Sensitivity: 4 mV

## 2021-07-24 ENCOUNTER — Other Ambulatory Visit: Payer: Self-pay

## 2021-07-24 ENCOUNTER — Ambulatory Visit (INDEPENDENT_AMBULATORY_CARE_PROVIDER_SITE_OTHER): Payer: Medicare Other

## 2021-07-24 DIAGNOSIS — L209 Atopic dermatitis, unspecified: Secondary | ICD-10-CM | POA: Diagnosis not present

## 2021-07-29 ENCOUNTER — Other Ambulatory Visit: Payer: Self-pay | Admitting: Cardiovascular Disease

## 2021-07-29 NOTE — Progress Notes (Signed)
Remote pacemaker transmission.   

## 2021-08-06 ENCOUNTER — Ambulatory Visit (INDEPENDENT_AMBULATORY_CARE_PROVIDER_SITE_OTHER): Payer: Medicare Other

## 2021-08-06 DIAGNOSIS — L209 Atopic dermatitis, unspecified: Secondary | ICD-10-CM

## 2021-08-12 ENCOUNTER — Encounter: Payer: Self-pay | Admitting: Gastroenterology

## 2021-08-12 ENCOUNTER — Ambulatory Visit (INDEPENDENT_AMBULATORY_CARE_PROVIDER_SITE_OTHER): Payer: Medicare Other | Admitting: Gastroenterology

## 2021-08-12 ENCOUNTER — Telehealth: Payer: Self-pay

## 2021-08-12 VITALS — BP 122/80 | HR 72 | Ht 72.0 in | Wt 181.1 lb

## 2021-08-12 DIAGNOSIS — I25118 Atherosclerotic heart disease of native coronary artery with other forms of angina pectoris: Secondary | ICD-10-CM | POA: Diagnosis not present

## 2021-08-12 DIAGNOSIS — Z800 Family history of malignant neoplasm of digestive organs: Secondary | ICD-10-CM

## 2021-08-12 DIAGNOSIS — Z7902 Long term (current) use of antithrombotics/antiplatelets: Secondary | ICD-10-CM

## 2021-08-12 MED ORDER — PLENVU 140 G PO SOLR: 1.0000 | ORAL | 0 refills | Status: DC

## 2021-08-12 NOTE — Telephone Encounter (Signed)
[facility] Pre-operative Risk Assessment     Request for surgical clearance:     Endoscopy Procedure  What type of surgery is being performed?     Colonoscopy  When is this surgery scheduled?     09/01/2021  What type of clearance is required ?   Pharmacy  Are there any medications that need to be held prior to surgery and how long? Eliquis starting 2 days prior  Practice name and name of physician performing surgery?      [facility]  What is your office phone and fax number?      Phone- [phone]  [phone]  Anesthesia type (None, local, MAC, general) ?       MAC

## 2021-08-12 NOTE — Progress Notes (Signed)
[facility] Consult Note:  History: [name], MD 08/12/2021  Referring provider: [name], MD  Reason for consult/chief complaint: Colonoscopy (Patient is here to discuss Colon. 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  Recommendation was recall in 5 years due to history of colorectal cancer in his sister, who was diagnosed under age 50.  He has been well from a digestive standpoint with some occasional irregularity that he has treated with Metamucil over years.  Occasionally a spot of blood on the paper if he is constipated but no frank rectal bleeding.  Denies nausea vomiting dysphagia or early satiety.  He remains active, denies chest pain or dyspnea with exertion.  He is sadly trying to navigate life without his wife, who he lost to a sudden illness from intestinal ischemia in March.  ROS:  Review of Systems  Constitutional:  Negative for appetite change and unexpected weight change.  HENT:  Negative for mouth sores and voice change.   Eyes:  Negative for pain and redness.  Respiratory:  Negative for cough and shortness of breath.   Cardiovascular:  Negative for chest pain and palpitations.  Genitourinary:  Negative for dysuria and hematuria.  Musculoskeletal:  Positive for arthralgias. Negative for myalgias.  Skin:  Negative for pallor and rash.  Neurological:  Negative for weakness and headaches.  Hematological:  Negative for adenopathy.    Past Medical History: Past Medical History:  Diagnosis Date   Atrial flutter -atypical 2007   ABLATION x2 [facility]   Atrial tachycardia ([place]) 2007   ABLATION [facility]   Benign paroxysmal vertigo    CAD (coronary artery disease)    a. abnl mv 01/2012;  b. cath 05/23/2012 LCX 80@ bifurcation w/ OM,  95% @ AV groove, otw nonobs dzs, EF 55-65%;   Carcinoma ([place])    skin cancer "off my chest"   Familial tremor    First degree AV block    had 4 ablasions that left py with heart block   History of blood transfusion 2010   "2nd day after prostatectomy"   Hyperlipidemia    Hypertension    Incontinence    DECREASING INCONTINENCE RELATED TO PROSTATECTOMY.   Migraines    Presence of permanent cardiac pacemaker 2013 ?   Prostate cancer ([place])    Pulsatile tinnitus    Tachy-brady syndrome ([place])    a. developed after RFCA @ [facility] 2013-> MDT PPM;  b. s/p RA lead revision 05/2012   Dr. Olin Pia comprehensive cardiology office note of 05/12/2021 was reviewed, and includes the following: "Seen in followup for atrial arrhythmias for which he takes no antiarrhythmic currently. He is s/[facility]  he is status post pacemaker for complete heart block. He  is seen now status post CRT P upgrade   Functional status is been quite stable.  There was a recent visit with [facility]. [facility] regarding chest discomfort.  Felt not to be anginal.  Also some back discomfort possibly related to Repatha.  A 6-week holiday was undertaken without any change and he has resumed his Repatha.  Intercurrently however, he has suffered the tragic loss over 72-hour period of his wife from an acute abdominal catastrophe.  His son has been very positively involved.   He has a history  of coronary artery disease with prior stenting; per TDS  It was elected to use long term aspirin and a NOAC and Rivaroxaban was started; no interval bleeding   In the past angina has been triggered typically by emotional events.  He had recurrent symptoms last summer.  He underwent repeat catheterization with a 95% proximal LAD lesion noted.  Treated DES   Changed BP meds ? 2/2 drug reaction to losartan Has had hx of hyponatremia in past with thiazides and concerns of hypokalemia Rx aldactazide>> his Na>>130 w low grade HA, was going to change to aldactone  but blood pressures have remained in the 110-130 range.  Past Surgical History: Past Surgical History:  Procedure Laterality Date   ATRIAL ABLATION SURGERY  04/01/12   "4th time I had one"   CARDIAC CATHETERIZATION     CARDIOVERSION  12/03/2011   Procedure: CARDIOVERSION;  Surgeon: [name], MD;  Location: [place];  Service: Cardiovascular;  Laterality: [name];   CARDIOVERSION  01/13/2012   Procedure: CARDIOVERSION;  Surgeon: Lelon Perla, MD;  Location: [place];  Service: Cardiovascular;  Laterality: [name];   CARDIOVERSION  04/27/2012   Procedure: CARDIOVERSION;  Surgeon: Darlin Coco, MD;  Location: [place];  Service: Cardiovascular;  Laterality: [name];   COLONOSCOPY     COLONOSCOPY WITH PROPOFOL [name] 10/29/2016   Procedure: COLONOSCOPY WITH PROPOFOL;  Surgeon: Doran Stabler, MD;  Location: [facility];  Service: Gastroenterology;  Laterality: [name];   CORONARY ANGIOPLASTY WITH STENT PLACEMENT  06/02/12   "1; first one ever"   CORONARY STENT INTERVENTION [name] 07/21/2017   Procedure: CORONARY STENT INTERVENTION;  Surgeon: [name], MD;  Location: [place] [facility];  Service: Cardiovascular;  Laterality: [name];   CORONARY STENT INTERVENTION [name] 12/13/2018   Procedure: CORONARY STENT INTERVENTION;  Surgeon: [name], MD;  Location: [place] [facility];  Service: Cardiovascular;  Laterality: [name];   EP IMPLANTABLE DEVICE [name] 06/17/2016   Procedure: BiV Pacemaker upgrade;  Surgeon: [name], MD;  Location: [place] [facility];  Service: Cardiovascular;  Laterality: [name];   HERNIA REPAIR  1980's   INGUINAL HERNIA REPAIR  1945   left side   INSERT / REPLACE / REMOVE PACEMAKER  03/2012   initial placement   INSERT / REPLACE / REMOVE PACEMAKER  05/25/12   "atrial lead change"   LEAD REVISION [name] 05/25/2012   Procedure: LEAD REVISION;  Surgeon: Evans Lance, MD;  Location: [facility] C[facility];  Service: Cardiovascular;  Laterality: [name];   LEFT HEART CATH AND CORONARY ANGIOGRAPHY [name] 07/21/2017    Procedure: LEFT HEART CATH AND CORONARY ANGIOGRAPHY;  Surgeon: [name], MD;  Location: [place] [facility];  Service: Cardiovascular;  Laterality: [name];   LEFT HEART CATH AND CORONARY ANGIOGRAPHY [name] 12/13/2018   Procedure: LEFT HEART CATH AND CORONARY ANGIOGRAPHY;  Surgeon: [name], MD;  Location: [place] [facility];  Service: Cardiovascular;  Laterality: [name];   PERCUTANEOUS CORONARY STENT INTERVENTION (PCI-S) [name] 06/02/2012   Procedure: PERCUTANEOUS CORONARY STENT INTERVENTION (PCI-S);  Surgeon: Hillary Bow, MD;  Location: [facility] CATH LAB;  Service: Cardiovascular;  Laterality: [name];   PROSTATECTOMY  07/21/1739   COMPLICATED   SHOULDER SURGERY     Bilateral; "boney spurs removed"   SKIN CANCER EXCISION     "in situ; right chest"   TONSILLECTOMY AND ADENOIDECTOMY     "as a child"     Family History: Family History  Problem Relation Age of Onset   Colon  cancer Sister    Heart disease Father    Heart disease Brother        only one brother   Diabetes Brother        only one brother    Social History: Social History   Socioeconomic History   Marital status: Married    Spouse name: Not on file   Number of children: Not on file   Years of education: Not on file   Highest education level: Not on file  Occupational History   Occupation: Physician    Comment: Retired - ENT  Tobacco Use   Smoking status: Never   Smokeless tobacco: Never  Vaping Use   Vaping Use: Never used  Substance and Sexual Activity   Alcohol use: Yes    Alcohol/week: 3.0 standard drinks    Types: 3 Standard drinks or equivalent per week    Comment: drinks alcohol at least once every 3 months 05/25/12 "last alcohol 02/2012"   Drug use: No   Sexual activity: Not Currently  Other Topics Concern   Not on file  Social History Narrative   Retired ENT   Social Determinants of Health   Financial Resource Strain: Not on file  Food Insecurity: Not on file  Transportation Needs: Not on file   Physical Activity: Not on file  Stress: Not on file  Social Connections: Not on file    Allergies: Allergies  Allergen Reactions   Ceclor [Cefaclor] Hives   Chlorhexidine Itching and Rash   Ace Inhibitors Other (See Comments)    Does not take due to "angiotensin is responsible for maintaining bladder sphincter tone"   Amlodipine     Other reaction(s): lethargy   Xarelto [Rivaroxaban]     Outpatient Meds: Current Outpatient Medications  Medication Sig Dispense Refill   acyclovir (ZOVIRAX) 400 MG tablet Take 400 mg by mouth as needed.     apixaban (ELIQUIS) 5 MG TABS tablet Take 1 tablet (5 mg total) by mouth 2 (two) times daily. 60 tablet 3   aspirin EC 81 MG tablet Take 1 tablet (81 mg total) by mouth daily. Swallow whole.     cetirizine (ZYRTEC) 10 MG tablet Take 10 mg by mouth daily.      DUPIXENT 300 MG/2ML prefilled syringe Inject 1 pen/syringe subcutaneously every other week 4 mL 11   EPINEPHrine 0.3 mg/0.3 mL IJ SOAJ injection Inject 0.3 mLs into the skin 1 (one) time orthopaedic injection.     losartan (COZAAR) 25 MG tablet Take 25 mg by mouth daily.     metroNIDAZOLE (METROGEL) 0.75 % gel Apply 1 application topically as needed.     nitroGLYCERIN (NITROSTAT) 0.4 MG SL tablet Place 0.4 mg under the tongue every 5 (five) minutes as needed for chest pain (x 3 tabs).     PEG-KCl-NaCl-NaSulf-Na Asc-C (PLENVU) 140 g SOLR Take 1 kit by mouth as directed. 1 each 0   propranolol (INDERAL) 10 MG tablet 1 tablet     REPATHA SURECLICK 193 MG/ML SOAJ INJECT 1 PEN INTO THE SKIN EVERY 14 DAYS 2 mL 11   Current Facility-Administered Medications  Medication Dose Route Frequency Provider Last Rate Last Admin   dupilumab (DUPIXENT) prefilled syringe 300 mg  300 mg Subcutaneous Q14 Days Kozlow, Donnamarie Poag, MD   300 mg at 08/06/21 1005      ___________________________________________________________________ Objective   Exam:  BP 122/80   Pulse 72   Ht 6' (1.829 m)   Wt 181 lb 2 oz  (82.2  kg)   BMI 24.56 kg/m  Wt Readings from Last 3 Encounters:  08/12/21 181 lb 2 oz (82.2 kg)  05/12/21 181 lb (82.1 kg)  04/15/21 184 lb 12.8 oz (83.8 kg)    General: Well-appearing Eyes: sclera anicteric, no redness ENT: oral mucosa moist without lesions, no cervical or supraclavicular lymphadenopathy CV: RRR without murmur, S1/S2, no JVD, no peripheral edema.  Pacer left upper chest wall Resp: clear to auscultation bilaterally, normal RR and effort noted GI: soft, no tenderness, with active bowel sounds. No guarding or palpable organomegaly noted. Skin; warm and dry, no rash or jaundice noted Neuro: awake, alert and oriented x 3. Normal gross motor function and fluent speech   Radiologic Studies:  January 2020 echocardiogram  1. The left ventricle appears to be normal in size, has mild wall  thickness 45-50% ejection fraction Spectral Doppler shows indeterminate  pattern of diastolic filling.   2. The right ventricle has mildly enlarged size and normal systolic  function.   3. Right ventricular systolic pressure is is normal.   4. Moderately dilated left atrial size.   5. Severely dilated right atrial size.   6. Mitral valve regurgitation is trivial by color flow Doppler.   7. Tricuspid regurgitation is mild.   8. There is mild sclerosis of the aortic valve.   9. Pulmonic valve regurgitation is mild by color flow Doppler.  10. The interatrial septum was not well visualized.   Assessment: Encounter Diagnoses  Name Primary?   Family history of colon cancer Yes   Long term (current) use of antithrombotics/antiplatelets     Family history of colon cancer in sister who was diagnosed in her mid 32s.  No adenomatous polyps on [name] last colonoscopy almost 5 years ago I recommended a screening colonoscopy, and he was agreeable after discussion of procedure and risks.  I believe his health status and risks are acceptable.  If he has low risk findings, this will most likely be  his last colonoscopy given current guidelines.  Hold Eliquis 36 to 48 hours before procedure,, and we will clear with his cardiologist.   The benefits and risks of the planned procedure were described in detail with the patient or (when appropriate) their health care proxy.  Risks were outlined as including, but not limited to, bleeding, infection, perforation, adverse medication reaction leading to cardiac or pulmonary decompensation, pancreatitis (if ERCP).  The limitation of incomplete mucosal visualization was also discussed.  No guarantees or warranties were given.   Thank you for the courtesy of this consult.  Please call me with any questions or concerns.  Nelida Meuse III  CC: Referring provider noted above

## 2021-08-12 NOTE — Patient Instructions (Signed)
If you are age 77 or older, your body mass index should be between 23-30. Your Body mass index is 24.56 kg/m. If this is out of the aforementioned range listed, please consider follow up with your Primary Care Provider. __________________________________________________________  The [facility] providers would like to encourage you to use [facility] to communicate with providers for non-urgent requests or questions.  Due to long hold times on the telephone, sending your provider a message by [facility] may be a faster and more efficient way to get a response.  Please allow 48 business hours for a response.  Please remember that this is for non-urgent requests.   You have been scheduled for a colonoscopy. Please follow written instructions given to you at your visit today.  Please pick up your prep supplies at the pharmacy within the next 1-3 days. If you use inhalers (even only as needed), please bring them with you on the day of your procedure.  You will be contacted by our office prior to your procedure for directions on holding your Eliquis.  If you do not hear from our office 1 week prior to your scheduled procedure, please call [phone] to discuss.   It was a pleasure to see you today!  Thank you for trusting me with your gastrointestinal care!

## 2021-08-13 NOTE — Telephone Encounter (Signed)
Patient with diagnosis of afib on Eliquis for anticoagulation.    Procedure: colonoscopy Date of procedure: 09/01/21  CHA2DS2-VASc Score = 5  This indicates a 7.2% annual risk of stroke. The patient's score is based upon: CHF History: 1 HTN History: 1 Diabetes History: 0 Stroke History: 0 Vascular Disease History: 1 Age Score: 2 Gender Score: 0   CrCl 31mL/min  Per office protocol, patient can hold Eliquis for 2 days prior to procedure as requested.

## 2021-08-13 NOTE — Telephone Encounter (Signed)
    Patient Name: Walter CANUPP, MD  DOB: 11-Feb-1946 MRN: [id number]  Primary Cardiologist: [name], MD  Chart reviewed as part of pre-operative protocol coverage.   Per pharmacy recommendations, patient can hold Eliquis 2 days prior to his upcoming colonoscopy with plans to restart as soon as he is cleared to do so by his gastroenterologist.  I will route this recommendation to the requesting party via [place] fax function and remove from pre-op pool.  Please call with questions.  Abigail Butts, PA-C 08/13/2021, 11:13 AM

## 2021-08-14 ENCOUNTER — Telehealth: Payer: Self-pay | Admitting: Gastroenterology

## 2021-08-14 NOTE — Telephone Encounter (Signed)
Patient has returned call and has been advised when to stop Eliquis.

## 2021-08-14 NOTE — Telephone Encounter (Signed)
Left voicemail for patient to return call.

## 2021-08-14 NOTE — Telephone Encounter (Signed)
Inbound call from patient requesting plenvu prep be sent to [facility] on [address]. Patient would like a call for conformation.

## 2021-08-15 ENCOUNTER — Other Ambulatory Visit: Payer: Self-pay

## 2021-08-15 MED ORDER — PLENVU 140 G PO SOLR: 1.0000 | ORAL | 0 refills | Status: DC

## 2021-08-15 NOTE — Telephone Encounter (Signed)
Called and informed Mr Cisse, Rx has been sent as requested

## 2021-08-19 ENCOUNTER — Telehealth: Payer: Self-pay | Admitting: Gastroenterology

## 2021-08-19 NOTE — Telephone Encounter (Signed)
Inbound call from patient. States that plenvu is not covered by Medicare.

## 2021-08-20 ENCOUNTER — Telehealth: Payer: Self-pay

## 2021-08-20 ENCOUNTER — Other Ambulatory Visit: Payer: Self-pay

## 2021-08-20 ENCOUNTER — Ambulatory Visit (INDEPENDENT_AMBULATORY_CARE_PROVIDER_SITE_OTHER): Payer: Medicare Other | Admitting: *Deleted

## 2021-08-20 DIAGNOSIS — L209 Atopic dermatitis, unspecified: Secondary | ICD-10-CM

## 2021-08-20 NOTE — Telephone Encounter (Signed)
Left a detailed message to inform the patient to pick up from the office a Medicare coupon for Plenvu

## 2021-08-20 NOTE — Telephone Encounter (Signed)
Left a message to inform [name] he can come to the office to pick up a coupon for Medicare patients or we could mail one to his home address if needed.

## 2021-09-01 ENCOUNTER — Encounter: Payer: Self-pay | Admitting: Gastroenterology

## 2021-09-01 ENCOUNTER — Ambulatory Visit (AMBULATORY_SURGERY_CENTER): Payer: Medicare Other | Admitting: Gastroenterology

## 2021-09-01 VITALS — BP 135/90 | HR 70 | Temp 95.9°F | Resp 12 | Ht 72.0 in | Wt 181.0 lb

## 2021-09-01 DIAGNOSIS — Z1211 Encounter for screening for malignant neoplasm of colon: Secondary | ICD-10-CM | POA: Diagnosis not present

## 2021-09-01 DIAGNOSIS — Z800 Family history of malignant neoplasm of digestive organs: Secondary | ICD-10-CM | POA: Diagnosis not present

## 2021-09-01 DIAGNOSIS — D124 Benign neoplasm of descending colon: Secondary | ICD-10-CM

## 2021-09-01 MED ORDER — SODIUM CHLORIDE 0.9 % IV SOLN: 500.0000 mL | Freq: Once | INTRAVENOUS | Status: DC

## 2021-09-01 NOTE — Progress Notes (Signed)
No changes to clinical history since GI office visit on 08/12/21.  The patient is appropriate for an endoscopic procedure in the ambulatory setting.    

## 2021-09-01 NOTE — Progress Notes (Signed)
Report to PACU, RN, vss, BBS= Clear.  

## 2021-09-01 NOTE — Progress Notes (Signed)
Pt's states no medical or surgical changes since previsit or office visit. 

## 2021-09-01 NOTE — Progress Notes (Signed)
Called to room to assist during endoscopic procedure.  Patient ID and intended procedure confirmed with present staff. Received instructions for my participation in the procedure from the performing physician.  

## 2021-09-01 NOTE — Patient Instructions (Signed)
Handout given on polyps and diverticulosis.  RESUME Eliquis at usual dose tomorrow AM.  YOU HAD AN ENDOSCOPIC PROCEDURE TODAY AT [place]:   Refer to the procedure report that was given to you for any specific questions about what was found during the examination.  If the procedure report does not answer your questions, please call your gastroenterologist to clarify.  If you requested that your care partner not be given the details of your procedure findings, then the procedure report has been included in a sealed envelope for you to review at your convenience later.  YOU SHOULD EXPECT: Some feelings of bloating in the abdomen. Passage of more gas than usual.  Walking can help get rid of the air that was put into your GI tract during the procedure and reduce the bloating. If you had a lower endoscopy (such as a colonoscopy or flexible sigmoidoscopy) you may notice spotting of blood in your stool or on the toilet paper. If you underwent a bowel prep for your procedure, you may not have a normal bowel movement for a few days.  Please Note:  You might notice some irritation and congestion in your nose or some drainage.  This is from the oxygen used during your procedure.  There is no need for concern and it should clear up in a day or so.  SYMPTOMS TO REPORT IMMEDIATELY:  Following lower endoscopy (colonoscopy or flexible sigmoidoscopy):  Excessive amounts of blood in the stool  Significant tenderness or worsening of abdominal pains  Swelling of the abdomen that is new, acute  Fever of 100F or higher  For urgent or emergent issues, a gastroenterologist can be reached at any hour by calling [phone]. Do not use MyChart messaging for urgent concerns.    DIET:  We do recommend a small meal at first, but then you may proceed to your regular diet.  Drink plenty of fluids but you should avoid alcoholic beverages for 24 hours.  ACTIVITY:  You should plan to take it easy for  the rest of today and you should NOT DRIVE or use heavy machinery until tomorrow (because of the sedation medicines used during the test).    FOLLOW UP: Our staff will call the number listed on your records 48-72 hours following your procedure to check on you and address any questions or concerns that you may have regarding the information given to you following your procedure. If we do not reach you, we will leave a message.  We will attempt to reach you two times.  During this call, we will ask if you have developed any symptoms of COVID 19. If you develop any symptoms (ie: fever, flu-like symptoms, shortness of breath, cough etc.) before then, please call [phone].  If you test positive for Covid 19 in the 2 weeks post procedure, please call and report this information to [place].    If any biopsies were taken you will be contacted by phone or by letter within the next 1-3 weeks.  Please call us at [phone] if you have not heard about the biopsies in 3 weeks.    SIGNATURES/CONFIDENTIALITY: You and/or your care partner have signed paperwork which will be entered into your electronic medical record.  These signatures attest to the fact that that the information above on your After Visit Summary has been reviewed and is understood.  Full responsibility of the confidentiality of this discharge information lies with you and/or your care-partner.

## 2021-09-01 NOTE — Op Note (Signed)
[place] Patient Name: [name] Procedure Date: 09/01/2021 2:57 PM MRN: [id number] Endoscopist: Mallie Mussel L. Loletha Carrow , MD Age: 75 Referring MD:  Date of Birth: 02-Oct-1946 Gender: Male Account #: [id number] Procedure:                Colonoscopy Indications:              Colon cancer screening in patient at increased                            risk: Colorectal cancer in sister Medicines:                Monitored Anesthesia Care Procedure:                Pre-Anesthesia Assessment:                           - Prior to the procedure, a History and Physical                            was performed, and patient medications and                            allergies were reviewed. The patient's tolerance of                            previous anesthesia was also reviewed. The risks                            and benefits of the procedure and the sedation                            options and risks were discussed with the patient.                            All questions were answered, and informed consent                            was obtained. Prior Anticoagulants: The patient has                            taken Eliquis (apixaban), last dose was 2 days                            prior to procedure. ASA Grade Assessment: III - A                            patient with severe systemic disease. After                            reviewing the risks and benefits, the patient was                            deemed in satisfactory condition to undergo the  procedure.                           After obtaining informed consent, the colonoscope                            was passed under direct vision. Throughout the                            procedure, the patient's blood pressure, pulse, and                            oxygen saturations were monitored continuously. The                            CF [id number] #[id number] was introduced through the anus                             and advanced to the the cecum, identified by                            appendiceal orifice and ileocecal valve. The                            colonoscopy was performed without difficulty. The                            patient tolerated the procedure well. The quality                            of the bowel preparation was good. The ileocecal                            valve, appendiceal orifice, and rectum were                            photographed. Scope In: 3:08:00 PM Scope Out: 3:24:51 PM Scope Withdrawal Time: 0 hours 11 minutes 5 seconds  Total Procedure Duration: 0 hours 16 minutes 51 seconds  Findings:                 The perianal and digital rectal examinations were                            normal.                           Multiple diverticula were found in the left colon.                           A 4 mm polyp was found in the proximal descending                            colon. The polyp was sessile. The polyp was removed  with a cold snare. Resection and retrieval were                            complete.                           An area of melanosis was found in the entire colon.                           The exam was otherwise without abnormality on                            direct and retroflexion views. Complications:            No immediate complications. Estimated Blood Loss:     Estimated blood loss was minimal. Impression:               - Diverticulosis in the left colon.                           - One 4 mm polyp in the proximal descending colon,                            removed with a cold snare. Resected and retrieved.                           - Melanosis in the colon.                           - The examination was otherwise normal on direct                            and retroflexion views. Recommendation:           - Patient has a contact number available for                            emergencies. The signs and  symptoms of potential                            delayed complications were discussed with the                            patient. Return to normal activities tomorrow.                            Written discharge instructions were provided to the                            patient.                           - Resume previous diet.                           - Continue present medications.                           -  Await pathology results.                           - Based on age and current guidelines, no repeat                            screening/surveillance colonoscopy recommended.                           - Resume Eliquis at usual dose tomorrow AM. Estill Cotta. Loletha Carrow, MD 09/01/2021 3:33:34 PM This report has been signed electronically.

## 2021-09-03 ENCOUNTER — Telehealth: Payer: Self-pay

## 2021-09-03 ENCOUNTER — Other Ambulatory Visit: Payer: Self-pay

## 2021-09-03 ENCOUNTER — Ambulatory Visit (INDEPENDENT_AMBULATORY_CARE_PROVIDER_SITE_OTHER): Payer: Medicare Other | Admitting: *Deleted

## 2021-09-03 DIAGNOSIS — L209 Atopic dermatitis, unspecified: Secondary | ICD-10-CM

## 2021-09-03 NOTE — Telephone Encounter (Signed)
  Follow up Call-  Call back number 09/01/2021  Post procedure Call Back phone  # [phone]  Permission to leave phone message Yes  Some recent data might be hidden     Patient questions:  Do you have a fever, pain , or abdominal swelling? No. Pain Score  0 *  Have you tolerated food without any problems? Yes.    Have you been able to return to your normal activities? Yes.    Do you have any questions about your discharge instructions: Diet   No. Medications  No. Follow up visit  No.  Do you have questions or concerns about your Care? No.  Actions: * If pain score is 4 or above: No action needed, pain <4.

## 2021-09-05 ENCOUNTER — Telehealth: Payer: Self-pay | Admitting: Gastroenterology

## 2021-09-05 NOTE — Telephone Encounter (Signed)
Patient called would like to discuss path report with a nurse.

## 2021-09-05 NOTE — Telephone Encounter (Signed)
Path results not yet reviewed by Dr. Loletha Carrow. Called pt to advise that once path results have been reviewed, he will receive a call from our office about his results and with Dr. Loletha Carrow' recommendations. Verbalized acceptance and understanding.

## 2021-09-09 ENCOUNTER — Encounter: Payer: Self-pay | Admitting: Gastroenterology

## 2021-09-17 ENCOUNTER — Ambulatory Visit (INDEPENDENT_AMBULATORY_CARE_PROVIDER_SITE_OTHER): Payer: Medicare Other

## 2021-09-17 ENCOUNTER — Other Ambulatory Visit: Payer: Self-pay

## 2021-09-17 DIAGNOSIS — L209 Atopic dermatitis, unspecified: Secondary | ICD-10-CM

## 2021-10-01 ENCOUNTER — Ambulatory Visit (INDEPENDENT_AMBULATORY_CARE_PROVIDER_SITE_OTHER): Payer: Medicare Other

## 2021-10-01 ENCOUNTER — Other Ambulatory Visit: Payer: Self-pay

## 2021-10-01 DIAGNOSIS — L209 Atopic dermatitis, unspecified: Secondary | ICD-10-CM | POA: Diagnosis not present

## 2021-10-13 ENCOUNTER — Other Ambulatory Visit: Payer: Self-pay

## 2021-10-13 ENCOUNTER — Ambulatory Visit (INDEPENDENT_AMBULATORY_CARE_PROVIDER_SITE_OTHER): Payer: Medicare Other | Admitting: Orthopedic Surgery

## 2021-10-13 ENCOUNTER — Encounter: Payer: Self-pay | Admitting: Orthopedic Surgery

## 2021-10-13 DIAGNOSIS — L600 Ingrowing nail: Secondary | ICD-10-CM

## 2021-10-13 DIAGNOSIS — I25118 Atherosclerotic heart disease of native coronary artery with other forms of angina pectoris: Secondary | ICD-10-CM | POA: Diagnosis not present

## 2021-10-13 NOTE — Progress Notes (Signed)
Office Visit Note   [name], MD           Date of Birth: 1946/06/19           MRN: [id number] Visit Date: 10/13/2021              Requested by: [name], MD [address],  [place] [address] PCP: [name], MD  Chief Complaint  Patient presents with   Left Foot - Nail Problem    GT, ingrown nail      HPI: Patient is a 75 year old gentleman who presents in follow-up with a recurrent painful ingrown toenail left great toe.  Assessment & Plan: Visit Diagnoses:  1. Ingrown nail of great toe of left foot     Plan: The medial border of the left great toenail was excised dry dressing was applied he will start cleansing and using a Band-Aid starting tomorrow.  Follow-Up Instructions: Return if symptoms worsen or fail to improve.   Ortho Exam  Patient is alert, oriented, no adenopathy, well-dressed, normal affect, normal respiratory effort. Examination patient has a painful ingrown toenail medial border left great toe.  There is no redness cellulitis or drainage.  Patient is on Eliquis.  After informed consent patient underwent a digital block with 10 cc 1% lidocaine plain the medial border of the nail was excised without complications.  There was then increased bleeding from the Eliquis.  A compression dry dressing was applied.  Imaging: No results found. No images are attached to the encounter.  Labs: Lab Results  Component Value Date   REPTSTATUS 02/25/2009 FINAL 02/25/2009     Lab Results  Component Value Date   ALBUMIN 4.3 04/15/2021   ALBUMIN 4.5 10/31/2019   ALBUMIN 4.6 08/21/2019    Lab Results  Component Value Date   MG 2.1 01/23/2012   MG 2.1 01/22/2012   MG 2.2 01/21/2012   No results found for: VD25OH  No results found for: PREALBUMIN CBC EXTENDED Latest Ref Rng & Units 12/19/2018 12/14/2018 12/12/2018  WBC 3.4 - 10.8 x10E3/uL 6.1 5.1 5.6  RBC 4.14 - 5.80 x10E6/uL 4.13(L) 4.10(L) 4.46  HGB 13.0 - 17.7 g/dL  12.7(L) 12.5(L) 13.4  HCT 37.5 - 51.0 % 36.7(L) 36.6(L) 40.8  PLT 150 - 450 x10E3/uL 170 135(L) 162  NEUTROABS 1.4 - 7.0 x10E3/uL 4.2 - -  LYMPHSABS 0.7 - 3.1 x10E3/uL 0.9 - -     There is no height or weight on file to calculate BMI.  Orders:  No orders of the defined types were placed in this encounter.  No orders of the defined types were placed in this encounter.    Procedures: No procedures performed  Clinical Data: No additional findings.  ROS:  All other systems negative, except as noted in the HPI. Review of Systems  Objective: Vital Signs: There were no vitals taken for this visit.  Specialty Comments:  No specialty comments available.  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 abnl mv 01/2012;  b. cath 05/23/2012 LCX 80@ bifurcation w/ OM, 95% @ AV groove, otw nonobs dzs, EF 55-65%;   Carcinoma ([facility])    skin cancer "off my chest"   Familial tremor    First degree AV block     had 4 ablasions that left py with heart block   History of blood transfusion 2010   "2nd day after prostatectomy"   Hyperlipidemia    Hypertension    Incontinence    DECREASING INCONTINENCE RELATED TO PROSTATECTOMY.   Migraines    Presence of permanent cardiac pacemaker 2013 ?   Prostate cancer ([facility])    Pulsatile tinnitus    Tachy-brady syndrome ([facility])    a. developed after RFCA @ [facility] 2013-> MDT PPM;  b. s/p RA lead revision 05/2012    Family History  Problem Relation Age of Onset   Heart disease Father    Colon cancer Sister    Heart disease Brother        only one brother   Diabetes Brother        only one brother   Colon polyps Neg Hx    Rectal cancer Neg Hx    Stomach cancer Neg Hx    Esophageal cancer Neg Hx     Past Surgical History:  Procedure Laterality Date   ATRIAL ABLATION SURGERY  04/01/12   "4th time I had one"   [place]  12/03/2011   Procedure: CARDIOVERSION;  Surgeon: [name], MD;  Location: [facility];  Service: Cardiovascular;  Laterality: [name];   CARDIOVERSION  01/13/2012   Procedure: CARDIOVERSION;  Surgeon: Lelon Perla, MD;  Location: [facility];  Service: Cardiovascular;  Laterality: [name];   CARDIOVERSION  04/27/2012   Procedure: CARDIOVERSION;  Surgeon: Darlin Coco, MD;  Location: [facility] OR;  Service: Cardiovascular;  Laterality: [name];   COLONOSCOPY     COLONOSCOPY WITH PROPOFOL [name] 10/29/2016   Procedure: COLONOSCOPY WITH PROPOFOL;  Surgeon: Doran Stabler, MD;  Location: [facility];  Service: Gastroenterology;  Laterality: [name];   CORONARY ANGIOPLASTY WITH STENT PLACEMENT  06/02/12   "1; first one ever"   CORONARY STENT INTERVENTION [name] 07/21/2017   Procedure: CORONARY STENT INTERVENTION;  Surgeon: [name], MD;  Location: [facility] CV LAB;  Service: Cardiovascular;  Laterality: [name];   CORONARY STENT INTERVENTION [name] 12/13/2018   Procedure: CORONARY STENT INTERVENTION;  Surgeon: [name], MD;  Location:  [facility];  Service: Cardiovascular;  Laterality: [name];   EP IMPLANTABLE DEVICE [name] 06/17/2016   Procedure: BiV Pacemaker upgrade;  Surgeon: [name], MD;  Location: [facility];  Service: Cardiovascular;  Laterality: [name];   HERNIA REPAIR  1980's   INGUINAL HERNIA REPAIR  1945   left side   INSERT / REPLACE / REMOVE PACEMAKER  03/2012   initial placement   INSERT / REPLACE / REMOVE  PACEMAKER  05/25/12   "atrial lead change"   LEAD REVISION [name] 05/25/2012   Procedure: LEAD REVISION;  Surgeon: Evans Lance, MD;  Location: [facility] CATH LAB;  Service: Cardiovascular;  Laterality: [name];   LEFT HEART CATH AND CORONARY ANGIOGRAPHY [name] 07/21/2017   Procedure: LEFT HEART CATH AND CORONARY ANGIOGRAPHY;  Surgeon: [name], MD;  Location: [facility];  Service: Cardiovascular;  Laterality: [name];   LEFT HEART CATH AND CORONARY ANGIOGRAPHY [name] 12/13/2018   Procedure: LEFT HEART CATH AND CORONARY ANGIOGRAPHY;  Surgeon: [name], MD;  Location: [facility];  Service: Cardiovascular;  Laterality: [name];   PERCUTANEOUS CORONARY STENT INTERVENTION (PCI-S) [name] 06/02/2012   Procedure: PERCUTANEOUS CORONARY STENT INTERVENTION (PCI-S);  Surgeon: Hillary Bow, MD;  Location: [facility] CATH LAB;  Service: Cardiovascular;  Laterality: [name];   PROSTATECTOMY  06/18/2918   COMPLICATED   SHOULDER SURGERY     Bilateral; "boney spurs removed"   SKIN CANCER EXCISION     "in situ; right chest"   TONSILLECTOMY AND ADENOIDECTOMY     "as a child"   Social History   Occupational History   Occupation: Physician    Comment: Retired - ENT  Tobacco Use   Smoking status: Never   Smokeless tobacco: Never  Vaping Use   Vaping Use: Never used  Substance and Sexual Activity   Alcohol use: Yes    Alcohol/week: 3.0 standard drinks    Types: 3 Standard drinks or equivalent per week    Comment: drinks alcohol at least once every 3 months 05/25/12 "last alcohol 02/2012"   Drug use: No   Sexual  activity: Not Currently

## 2021-10-15 ENCOUNTER — Ambulatory Visit (INDEPENDENT_AMBULATORY_CARE_PROVIDER_SITE_OTHER): Payer: Medicare Other

## 2021-10-15 ENCOUNTER — Other Ambulatory Visit: Payer: Self-pay

## 2021-10-15 DIAGNOSIS — L209 Atopic dermatitis, unspecified: Secondary | ICD-10-CM

## 2021-10-15 DIAGNOSIS — I495 Sick sinus syndrome: Secondary | ICD-10-CM | POA: Diagnosis not present

## 2021-10-15 LAB — CUP PACEART REMOTE DEVICE CHECK
Battery Remaining Longevity: 40 mo
Battery Voltage: 2.93 V
Brady Statistic AP VP Percent: 74.3 %
Brady Statistic AP VS Percent: 0.68 %
Brady Statistic AS VP Percent: 22.9 %
Brady Statistic AS VS Percent: 2.01 %
Brady Statistic RA Percent Paced: 24.66 %
Brady Statistic RV Percent Paced: 98.94 %
Date Time Interrogation Session: 20221129213952
Implantable Lead Implant Date: 20130517
Implantable Lead Implant Date: 20130710
Implantable Lead Implant Date: 20170802
Implantable Lead Location: 753858
Implantable Lead Location: 753859
Implantable Lead Location: 753860
Implantable Pulse Generator Implant Date: 20170802
Lead Channel Impedance Value: 1007 Ohm
Lead Channel Impedance Value: 1064 Ohm
Lead Channel Impedance Value: 1121 Ohm
Lead Channel Impedance Value: 323 Ohm
Lead Channel Impedance Value: 342 Ohm
Lead Channel Impedance Value: 399 Ohm
Lead Channel Impedance Value: 418 Ohm
Lead Channel Impedance Value: 437 Ohm
Lead Channel Impedance Value: 475 Ohm
Lead Channel Impedance Value: 532 Ohm
Lead Channel Impedance Value: 684 Ohm
Lead Channel Impedance Value: 760 Ohm
Lead Channel Impedance Value: 836 Ohm
Lead Channel Impedance Value: 874 Ohm
Lead Channel Pacing Threshold Amplitude: 0.875 V
Lead Channel Pacing Threshold Amplitude: 1 V
Lead Channel Pacing Threshold Amplitude: 1.375 V
Lead Channel Pacing Threshold Pulse Width: 0.4 ms
Lead Channel Pacing Threshold Pulse Width: 0.4 ms
Lead Channel Pacing Threshold Pulse Width: 0.8 ms
Lead Channel Sensing Intrinsic Amplitude: 1.25 mV
Lead Channel Sensing Intrinsic Amplitude: 1.25 mV
Lead Channel Sensing Intrinsic Amplitude: 12 mV
Lead Channel Sensing Intrinsic Amplitude: 12 mV
Lead Channel Setting Pacing Amplitude: 1.75 V
Lead Channel Setting Pacing Amplitude: 2 V
Lead Channel Setting Pacing Amplitude: 2.5 V
Lead Channel Setting Pacing Pulse Width: 0.4 ms
Lead Channel Setting Pacing Pulse Width: 1 ms
Lead Channel Setting Sensing Sensitivity: 4 mV

## 2021-10-24 NOTE — Progress Notes (Signed)
Remote pacemaker transmission.   

## 2021-10-29 ENCOUNTER — Other Ambulatory Visit: Payer: Self-pay

## 2021-10-29 ENCOUNTER — Ambulatory Visit (INDEPENDENT_AMBULATORY_CARE_PROVIDER_SITE_OTHER): Payer: Medicare Other

## 2021-10-29 DIAGNOSIS — L209 Atopic dermatitis, unspecified: Secondary | ICD-10-CM

## 2021-10-30 ENCOUNTER — Telehealth: Payer: Self-pay | Admitting: *Deleted

## 2021-10-30 NOTE — Telephone Encounter (Signed)
Spoke to patient and advised that we should try pen samples in clinic to see if patient will tolerate it before change

## 2021-10-30 NOTE — Telephone Encounter (Signed)
Patient called and wanted to know it he could change to Dupixent pens from syringes. L/m for patient to contact me will need new approval and rx

## 2021-11-13 ENCOUNTER — Ambulatory Visit (INDEPENDENT_AMBULATORY_CARE_PROVIDER_SITE_OTHER): Payer: Medicare Other | Admitting: *Deleted

## 2021-11-13 ENCOUNTER — Other Ambulatory Visit: Payer: Self-pay

## 2021-11-13 DIAGNOSIS — L209 Atopic dermatitis, unspecified: Secondary | ICD-10-CM | POA: Diagnosis not present

## 2021-11-15 ENCOUNTER — Emergency Department (HOSPITAL_COMMUNITY): Payer: Medicare Other

## 2021-11-15 ENCOUNTER — Emergency Department (HOSPITAL_COMMUNITY)
Admission: EM | Admit: 2021-11-15 | Discharge: 2021-11-15 | Disposition: A | Discharge status: Home / Self Care | Payer: Medicare Other | Attending: Emergency Medicine | Admitting: Emergency Medicine

## 2021-11-15 DIAGNOSIS — Z79899 Other long term (current) drug therapy: Secondary | ICD-10-CM | POA: Diagnosis not present

## 2021-11-15 DIAGNOSIS — Z950 Presence of cardiac pacemaker: Secondary | ICD-10-CM | POA: Diagnosis not present

## 2021-11-15 DIAGNOSIS — I10 Essential (primary) hypertension: Secondary | ICD-10-CM | POA: Insufficient documentation

## 2021-11-15 DIAGNOSIS — Z7901 Long term (current) use of anticoagulants: Secondary | ICD-10-CM | POA: Insufficient documentation

## 2021-11-15 DIAGNOSIS — Z7982 Long term (current) use of aspirin: Secondary | ICD-10-CM | POA: Insufficient documentation

## 2021-11-15 DIAGNOSIS — I4819 Other persistent atrial fibrillation: Secondary | ICD-10-CM | POA: Insufficient documentation

## 2021-11-15 DIAGNOSIS — Z85820 Personal history of malignant melanoma of skin: Secondary | ICD-10-CM | POA: Insufficient documentation

## 2021-11-15 DIAGNOSIS — R52 Pain, unspecified: Secondary | ICD-10-CM

## 2021-11-15 DIAGNOSIS — R110 Nausea: Secondary | ICD-10-CM | POA: Insufficient documentation

## 2021-11-15 DIAGNOSIS — Z8546 Personal history of malignant neoplasm of prostate: Secondary | ICD-10-CM | POA: Diagnosis not present

## 2021-11-15 DIAGNOSIS — R109 Unspecified abdominal pain: Secondary | ICD-10-CM | POA: Diagnosis present

## 2021-11-15 DIAGNOSIS — R1011 Right upper quadrant pain: Secondary | ICD-10-CM | POA: Diagnosis not present

## 2021-11-15 DIAGNOSIS — I2510 Atherosclerotic heart disease of native coronary artery without angina pectoris: Secondary | ICD-10-CM | POA: Diagnosis not present

## 2021-11-15 LAB — CBC WITH DIFFERENTIAL/PLATELET
Abs Immature Granulocytes: 0.02 10*3/uL (ref 0.00–0.07)
Basophils Absolute: 0 10*3/uL (ref 0.0–0.1)
Basophils Relative: 0 %
Eosinophils Absolute: 0.1 10*3/uL (ref 0.0–0.5)
Eosinophils Relative: 1 %
HCT: 36.1 % — ABNORMAL LOW (ref 39.0–52.0)
Hemoglobin: 12.2 g/dL — ABNORMAL LOW (ref 13.0–17.0)
Immature Granulocytes: 0 %
Lymphocytes Relative: 11 %
Lymphs Abs: 0.7 10*3/uL (ref 0.7–4.0)
MCH: 31 pg (ref 26.0–34.0)
MCHC: 33.8 g/dL (ref 30.0–36.0)
MCV: 91.9 fL (ref 80.0–100.0)
Monocytes Absolute: 0.6 10*3/uL (ref 0.1–1.0)
Monocytes Relative: 10 %
Neutro Abs: 4.5 10*3/uL (ref 1.7–7.7)
Neutrophils Relative %: 78 %
Platelets: 168 10*3/uL (ref 150–400)
RBC: 3.93 MIL/uL — ABNORMAL LOW (ref 4.22–5.81)
RDW: 12.2 % (ref 11.5–15.5)
WBC: 5.8 10*3/uL (ref 4.0–10.5)
nRBC: 0 % (ref 0.0–0.2)

## 2021-11-15 LAB — TROPONIN I (HIGH SENSITIVITY)
Troponin I (High Sensitivity): 14 ng/L (ref <18)
Troponin I (High Sensitivity): 14 ng/L (ref <18)

## 2021-11-15 LAB — URINALYSIS, ROUTINE W REFLEX MICROSCOPIC
Bilirubin Urine: NEGATIVE
Glucose, UA: NEGATIVE mg/dL
Hgb urine dipstick: NEGATIVE
Ketones, ur: NEGATIVE mg/dL
Leukocytes,Ua: NEGATIVE
Nitrite: NEGATIVE
Protein, ur: NEGATIVE mg/dL
Specific Gravity, Urine: 1.011 (ref 1.005–1.030)
pH: 8 (ref 5.0–8.0)

## 2021-11-15 LAB — COMPREHENSIVE METABOLIC PANEL
ALT: 25 U/L (ref 0–44)
AST: 28 U/L (ref 15–41)
Albumin: 3.7 g/dL (ref 3.5–5.0)
Alkaline Phosphatase: 82 U/L (ref 38–126)
Anion gap: 7 (ref 5–15)
BUN: 14 mg/dL (ref 8–23)
CO2: 25 mmol/L (ref 22–32)
Calcium: 8.8 mg/dL — ABNORMAL LOW (ref 8.9–10.3)
Chloride: 101 mmol/L (ref 98–111)
Creatinine, Ser: 1.04 mg/dL (ref 0.61–1.24)
GFR, Estimated: >60 mL/min (ref >60)
Glucose, Bld: 109 mg/dL — ABNORMAL HIGH (ref 70–99)
Potassium: 4.4 mmol/L (ref 3.5–5.1)
Sodium: 133 mmol/L — ABNORMAL LOW (ref 135–145)
Total Bilirubin: 2 mg/dL — ABNORMAL HIGH (ref 0.3–1.2)
Total Protein: 5.8 g/dL — ABNORMAL LOW (ref 6.5–8.1)

## 2021-11-15 LAB — LIPASE, BLOOD: Lipase: 30 U/L (ref 11–51)

## 2021-11-15 MED ORDER — MORPHINE SULFATE (PF) 4 MG/ML IV SOLN
4.0000 mg | Freq: Once | INTRAVENOUS | Status: AC
  Administered 2021-11-15: 4 mg via INTRAVENOUS
  Filled 2021-11-15: qty 1

## 2021-11-15 MED ORDER — LIDOCAINE VISCOUS HCL 2 % MT SOLN
15.0000 mL | Freq: Once | OROMUCOSAL | Status: AC
  Administered 2021-11-15: 15 mL via ORAL
  Filled 2021-11-15: qty 15

## 2021-11-15 MED ORDER — ALUM & MAG HYDROXIDE-SIMETH 200-200-20 MG/5ML PO SUSP
30.0000 mL | Freq: Once | ORAL | Status: AC
  Administered 2021-11-15: 30 mL via ORAL
  Filled 2021-11-15: qty 30

## 2021-11-15 MED ORDER — IOHEXOL 300 MG/ML  SOLN
100.0000 mL | Freq: Once | INTRAMUSCULAR | Status: AC | PRN
  Administered 2021-11-15: 100 mL via INTRAVENOUS

## 2021-11-15 MED ORDER — HYDROCODONE-ACETAMINOPHEN 5-325 MG PO TABS: 1.0000 | ORAL_TABLET | ORAL | 0 refills | Status: DC | PRN

## 2021-11-15 MED ORDER — PANTOPRAZOLE SODIUM 40 MG IV SOLR
40.0000 mg | Freq: Once | INTRAVENOUS | Status: AC
  Administered 2021-11-15: 40 mg via INTRAVENOUS
  Filled 2021-11-15: qty 40

## 2021-11-15 MED ORDER — ONDANSETRON HCL 4 MG/2ML IJ SOLN
4.0000 mg | Freq: Once | INTRAMUSCULAR | Status: AC
  Administered 2021-11-15: 4 mg via INTRAVENOUS
  Filled 2021-11-15: qty 2

## 2021-11-15 MED ORDER — ONDANSETRON HCL 4 MG PO TABS: 4.0000 mg | ORAL_TABLET | Freq: Four times a day (QID) | ORAL | 0 refills | Status: DC

## 2021-11-15 NOTE — ED Notes (Signed)
Pt back from [place]. Pt placed back on cardiac monitor and reassessed.

## 2021-11-15 NOTE — ED Triage Notes (Signed)
Pt BIB EMS for abdomen pain that began at 1220 right under the diaphragm that radiates into the back right under shoulder blade. Pt reports nausea w/o vomiting and "cant get comfortable" and nothing seems to be helping   Pt has a pacemaker and is on Eliquis  158/84 Equal pedal pulses 100% RA

## 2021-11-15 NOTE — ED Notes (Signed)
Pt teaching provided on medications that may cause drowsiness. Pt instructed not to drive or operate heavy machinery while taking the prescribed medication. Pt verbalized understanding.  ? ?Pt provided discharge instructions and prescription information. Pt was given the opportunity to ask questions and questions were answered. Discharge signature not obtained in the setting of the COVID-19 pandemic in order to reduce high touch surfaces.  ? ?

## 2021-11-15 NOTE — ED Notes (Signed)
Patient transported to Ultrasound 

## 2021-11-15 NOTE — ED Provider Notes (Signed)
[facility] EMERGENCY DEPARTMENT Provider Note   CSN: [id number] Arrival date & time: 11/15/21  1505     History No chief complaint on file.   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  The patient reports around midnight today he awoke with pain between his sternum and umbilicus there was tender to palpation with and occasionally radiate to his bilateral shoulders and back.  He reports the pain is constant but waxes and wanes and is ranges anywhere from a 2 to a 7 out of 10.  He reports the pain feels a sharp ache.  With severe pain, he does feel some mild nausea.  However he denies any vomiting, diarrhea, constipation, lightheadedness, dizziness, shortness of breath, or chest pain.  Medical history includes hypertension.  He does have significant cardiac history with 3 surgical stents, 4 ablations, and a pacemaker.  New medications include losartan, Eliquis, aspirin, propanolol, allergic to Ceclor and chlorhexidine.  Denies any EtOH, tobacco, or illicit drug use ever.  HPI     Past Medical History:  Diagnosis Date   Atrial flutter -atypical 2007   ABLATION x2 [facility]   Atrial tachycardia ([place]) 2007   ABLATION [facility]   Benign paroxysmal vertigo    CAD (coronary artery disease)    a. abnl mv 01/2012;  b. cath 05/23/2012 LCX 80@ bifurcation w/ OM, 95% @ AV groove, otw nonobs dzs, EF 55-65%;   Carcinoma ([place])    skin cancer "off my chest"   Familial tremor    First degree AV block    had 4 ablasions that left py with heart block   History of blood transfusion 2010   "2nd day after prostatectomy"   Hyperlipidemia    Hypertension    Incontinence    DECREASING INCONTINENCE RELATED TO PROSTATECTOMY.   Migraines    Presence of permanent cardiac pacemaker 2013 ?   Prostate cancer ([place])    Pulsatile tinnitus    Tachy-brady syndrome ([place])    a. developed after RFCA @ [facility] 2013-> MDT PPM;  b.  s/p RA lead revision 05/2012    Patient Active Problem List   Diagnosis Date Noted   Unstable angina ([place]) 12/13/2018   Family history of colon cancer    Rectal polyp    Cardiomyopathy, nonischemic ([place]) 06/17/2016   Malignant tumor of prostate ([place]) 05/11/2016   Dyspnea on exertion 11/25/2012   Hyponatremia 09/06/2012   CAD (coronary artery disease) 05/26/2012   Biventricular cardiac pacemaker in situ 04/15/2012   Benign positional vertigo 03/31/2012   First degree AV block 03/31/2012   Persistent atrial fibrillation ([place]) 03/31/2012   Sinus node dysfunction (HCC) 03/31/2012   Complete heart block (HCC)    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   Stress incontinence 11/02/2011   Hyperlipidemia    Pulsatile tinnitus    Atrial flutter/fibrillation    Familial tremor    Atrial tachycardia ([place])    Hypertension     Past Surgical History:  Procedure Laterality Date   ATRIAL ABLATION SURGERY  04/01/12   "4th time I had one"   CARDIAC CATHETERIZATION     CARDIOVERSION  12/03/2011   Procedure: CARDIOVERSION;  Surgeon: [name], MD;  Location: [place];  Service: Cardiovascular;  Laterality: [name];   CARDIOVERSION  01/13/2012   Procedure: CARDIOVERSION;  Surgeon: Lelon Perla, MD;  Location: [place];  Service: Cardiovascular;  Laterality: [name];  CARDIOVERSION  04/27/2012   Procedure: CARDIOVERSION;  Surgeon: Darlin Coco, MD;  Location: [place];  Service: Cardiovascular;  Laterality: [name];   COLONOSCOPY     COLONOSCOPY WITH PROPOFOL [name] 10/29/2016   Procedure: COLONOSCOPY WITH PROPOFOL;  Surgeon: Doran Stabler, MD;  Location: [facility];  Service: Gastroenterology;  Laterality: [name];   CORONARY ANGIOPLASTY WITH STENT PLACEMENT  06/02/12   "1; first one ever"   CORONARY STENT INTERVENTION [name] 07/21/2017   Procedure: CORONARY STENT INTERVENTION;  Surgeon: [name], MD;  Location: [place] [facility];  Service: Cardiovascular;  Laterality: [name];   CORONARY  STENT INTERVENTION [name] 12/13/2018   Procedure: CORONARY STENT INTERVENTION;  Surgeon: [name], MD;  Location: [place] [facility];  Service: Cardiovascular;  Laterality: [name];   EP IMPLANTABLE DEVICE [name] 06/17/2016   Procedure: BiV Pacemaker upgrade;  Surgeon: [name], MD;  Location: [place] [facility];  Service: Cardiovascular;  Laterality: [name];   HERNIA REPAIR  1980's   INGUINAL HERNIA REPAIR  1945   left side   INSERT / REPLACE / REMOVE PACEMAKER  03/2012   initial placement   INSERT / REPLACE / REMOVE PACEMAKER  05/25/12   "atrial lead change"   LEAD REVISION [name] 05/25/2012   Procedure: LEAD REVISION;  Surgeon: Evans Lance, MD;  Location: [facility];  Service: Cardiovascular;  Laterality: [name];   LEFT HEART CATH AND CORONARY ANGIOGRAPHY [name] 07/21/2017   Procedure: LEFT HEART CATH AND CORONARY ANGIOGRAPHY;  Surgeon: [name], MD;  Location: [place] [facility];  Service: Cardiovascular;  Laterality: [name];   LEFT HEART CATH AND CORONARY ANGIOGRAPHY [name] 12/13/2018   Procedure: LEFT HEART CATH AND CORONARY ANGIOGRAPHY;  Surgeon: [name], MD;  Location: [place] [facility];  Service: Cardiovascular;  Laterality: [name];   PERCUTANEOUS CORONARY STENT INTERVENTION (PCI-S) [name] 06/02/2012   Procedure: PERCUTANEOUS CORONARY STENT INTERVENTION (PCI-S);  Surgeon: Hillary Bow, MD;  Location: [facility] CATH LAB;  Service: Cardiovascular;  Laterality: [name];   PROSTATECTOMY  04/19/4033   COMPLICATED   SHOULDER SURGERY     Bilateral; "boney spurs removed"   SKIN CANCER EXCISION     "in situ; right chest"   TONSILLECTOMY AND ADENOIDECTOMY     "as a child"       Family History  Problem Relation Age of Onset   Heart disease Father    Colon cancer Sister    Heart disease Brother        only one brother   Diabetes Brother        only one brother   Colon polyps Neg Hx    Rectal cancer Neg Hx    Stomach cancer Neg Hx    Esophageal cancer Neg Hx     Social History   Tobacco Use    Smoking status: Never   Smokeless tobacco: Never  Vaping Use   Vaping Use: Never used  Substance Use Topics   Alcohol use: Yes    Alcohol/week: 3.0 standard drinks    Types: 3 Standard drinks or equivalent per week    Comment: drinks alcohol at least once every 3 months 05/25/12 "last alcohol 02/2012"   Drug use: No    Home Medications Prior to Admission medications   Medication Sig Start Date End Date Taking? Authorizing Provider  acyclovir (ZOVIRAX) 400 MG tablet Take 400 mg by mouth as needed. 07/01/20   [provider]  apixaban (ELIQUIS) 5 MG TABS tablet Take 1 tablet (5 mg  total) by mouth 2 (two) times daily. 11/15/18   [name], MD  aspirin EC 81 MG tablet Take 1 tablet (81 mg total) by mouth daily. Swallow whole. 04/29/20   [name], MD  cetirizine (ZYRTEC) 10 MG tablet Take 10 mg by mouth daily.     [provider]  DUPIXENT 300 MG/2ML prefilled syringe Inject 1 pen/syringe subcutaneously every other week 06/09/21   Kozlow, Donnamarie Poag, MD  EPINEPHrine 0.3 mg/0.3 mL IJ SOAJ injection Inject 0.3 mLs into the skin 1 (one) time orthopaedic injection. 03/03/19   [provider]  losartan (COZAAR) 25 MG tablet Take 25 mg by mouth daily.    [provider]  metroNIDAZOLE (METROGEL) 0.75 % gel Apply 1 application topically as needed. 08/11/19   [provider]  nitroGLYCERIN (NITROSTAT) 0.4 MG SL tablet Place 0.4 mg under the tongue every 5 (five) minutes as needed for chest pain (x 3 tabs).    [provider]  PEG-KCl-NaCl-NaSulf-Na Asc-C (PLENVU) 140 g SOLR Take 1 kit by mouth as directed. 08/15/21   Doran Stabler, MD  propranolol (INDERAL) 10 MG tablet 1 tablet 10/17/20   [provider]  REPATHA SURECLICK 622 MG/ML SOAJ INJECT 1 PEN INTO THE SKIN EVERY 14 DAYS 07/29/21   [name], MD    Allergies    Ceclor [cefaclor], Chlorhexidine, Ace inhibitors, Amlodipine, and Xarelto [rivaroxaban]  Review of Systems    Review of Systems  Constitutional:  Negative for chills and fever.  HENT:  Negative for ear pain and sore throat.   Eyes:  Negative for pain and visual disturbance.  Respiratory:  Negative for cough and shortness of breath.   Cardiovascular:  Negative for chest pain and palpitations.  Gastrointestinal:  Positive for abdominal pain and nausea. Negative for constipation, diarrhea and vomiting.  Genitourinary:  Negative for dysuria and hematuria.  Musculoskeletal:  Negative for arthralgias and back pain.  Skin:  Negative for color change and rash.  Neurological:  Negative for seizures, syncope and light-headedness.  All other systems reviewed and are negative.  Physical Exam Updated Vital Signs BP 122/87    Pulse 70    Temp 98.9 F (37.2 C)    Resp 18    Ht 6' (1.829 m)    Wt 82.1 kg    SpO2 98%    BMI 24.55 kg/m   Physical Exam Vitals and nursing note reviewed.  Constitutional:      Appearance: Normal appearance.  HENT:     Head: Normocephalic and atraumatic.  Eyes:     General: No scleral icterus. Cardiovascular:     Rate and Rhythm: Normal rate and regular rhythm.     Pulses: Normal pulses.     Comments: Pulses equal in upper and lower bilateral extremities Pulmonary:     Effort: Pulmonary effort is normal. No respiratory distress.     Breath sounds: Normal breath sounds.  Abdominal:     General: Abdomen is flat. Bowel sounds are normal.     Palpations: Abdomen is soft.     Tenderness: There is abdominal tenderness. There is no guarding or rebound.     Comments: No overlying skin changes noted.  No erythema, ecchymosis, rash, or abrasion noted to the area.  No pulsatile mass visualized.  Normoactive bowel sounds.  Mild tender to palpation of the epigastric and right upper quadrant area.  Negative Rovsing's.  Abdomen soft.  No guarding or rebound.  Musculoskeletal:  General: No deformity.     Cervical back: Normal range of motion.  Skin:    General: Skin is warm and  dry.  Neurological:     General: No focal deficit present.     Mental Status: He is alert. Mental status is at baseline.     Motor: No weakness.    ED Results / Procedures / Treatments   Labs (all labs ordered are listed, but only abnormal results are displayed) Labs Reviewed  COMPREHENSIVE METABOLIC PANEL - Abnormal; Notable for the following components:      Result Value   Sodium 133 (*)    Glucose, Bld 109 (*)    Calcium 8.8 (*)    Total Protein 5.8 (*)    Total Bilirubin 2.0 (*)    All other components within normal limits  CBC WITH DIFFERENTIAL/PLATELET - Abnormal; Notable for the following components:   RBC 3.93 (*)    Hemoglobin 12.2 (*)    HCT 36.1 (*)    All other components within normal limits  LIPASE, BLOOD  URINALYSIS, ROUTINE W REFLEX MICROSCOPIC  TROPONIN I (HIGH SENSITIVITY)  TROPONIN I (HIGH SENSITIVITY)    EKG None  Radiology CT ABDOMEN PELVIS W CONTRAST  Result Date: 11/15/2021 CLINICAL DATA:  Acute abdominal pain. EXAM: CT ABDOMEN AND PELVIS WITH CONTRAST TECHNIQUE: Multidetector CT imaging of the abdomen and pelvis was performed using the standard protocol following bolus administration of intravenous contrast. CONTRAST:  152m OMNIPAQUE IOHEXOL 300 MG/ML  SOLN COMPARISON:  CT scan 10/07/2018 and right upper quadrant ultrasound from today. FINDINGS: Lower chest: Minimal dependent subpleural atelectasis but no pulmonary lesions or pleural effusions. The heart is within normal limits in size for age. Stable pacer wires and aortic and coronary artery calcifications. Hepatobiliary: No hepatic lesions or intrahepatic biliary dilatation. There appears to be hyperemic type enhancement around the gallbladder fossa. Mild enhancement of the gallbladder wall and pericholecystic inflammatory changes are noted. No definite gallstones are seen on the ultrasound examination from today but there are changes of adenomyomatosis. I do not see any discrete pericholecystic fluid  collections or significant wall thickening on the ultrasound. No common bile duct dilatation. Pancreas: No mass, inflammation or ductal dilatation. Spleen: Normal size.  No focal lesions. Adrenals/Urinary Tract: Adrenal glands and kidneys are unremarkable. No renal lesions or hydronephrosis. The bladder is unremarkable. Stomach/Bowel: The stomach, duodenum, small bowel and colon are grossly normal. The appendix is normal. Sigmoid colon diverticulosis without findings for acute diverticulitis. Vascular/Lymphatic: Age related atherosclerotic calcifications involving the aorta and branch vessels but no aneurysm or dissection. The major venous structures are patent. No mesenteric or retroperitoneal mass or adenopathy. Reproductive: Status post prostatectomy. Other: No pelvic mass or adenopathy. No free pelvic fluid collections. No inguinal mass or adenopathy. No abdominal wall hernia or subcutaneous lesions. Musculoskeletal: No significant bony findings. Scoliosis and degenerative lumbar spondylosis no worrisome bone lesions. IMPRESSION: 1. Pericholecystic inflammatory changes are suggested on CT. Today's ultrasound examination did not show any definite gallstones but there were changes of adenomyomatosis. Recommend correlation with liver function studies. If the patient has right upper quadrant abdominal pain follow-up MRI/MRCP may be helpful for further evaluation. 2. No other significant abdominal/pelvic findings, mass lesions or adenopathy. 3. Status post prostatectomy. No pelvic mass or adenopathy. 4. Age related atherosclerotic calcifications involving the aorta and branch vessels. Aortic Atherosclerosis (ICD10-I70.0). Electronically Signed   By: PMarijo SanesM.D.   On: 11/15/2021 13:56   UK[place]Abdomen Limited RUQ (LIVER/GB)  Result Date: 11/15/2021  CLINICAL DATA:  Abdomen pain EXAM: ULTRASOUND ABDOMEN LIMITED RIGHT UPPER QUADRANT COMPARISON:  CT abdomen pelvis October 07, 2018 FINDINGS: Gallbladder: No  gallstones or wall thickening visualized. Small gallbladder polyps are identified. No sonographic Murphy sign noted by sonographer. Common bile duct: Diameter: 1.8 mm Liver: No focal lesion identified. Within normal limits in parenchymal echogenicity. Portal vein is patent on color Doppler imaging with normal direction of blood flow towards the liver. Other: None. IMPRESSION: No sonographic evidence of acute cholecystitis.  Gallbladder polyps. Electronically Signed   By: Abelardo Diesel M.D.   On: 11/15/2021 08:53     Procedures Procedures   Medications Ordered in ED Medications  ondansetron (ZOFRAN) injection 4 mg (4 mg Intravenous Given 11/15/21 0646)  morphine 4 MG/ML injection 4 mg (4 mg Intravenous Given 11/15/21 0647)  pantoprazole (PROTONIX) injection 40 mg (40 mg Intravenous Given 11/15/21 0815)    ED Course  I have reviewed the triage vital signs and the nursing notes.  Pertinent labs & imaging results that were available during my care of the patient were reviewed by me and considered in my medical decision making (see chart for details).  75 year old man presents emergency department for evaluation of epigastric/chest since midnight today.  Differential diagnosis includes but is not limited to gastritis, viral gastroenteritis, ACS, pancreatitis, cholecystitis, cholangitis, choledocholithiasis, dissection, aneurysm.  Hemodynamically stable.  Patient has mildly increased blood pressure 131/90.  Afebrile.  Normal pulse rate.  Satting 96% on room air.  Nonlabored breathing.  On physical exam, the patient has tenderness to his epigastric and right upper quadrant with no overlying skin changes noted to the area.  No guarding or rebound noted.  No pulsatile masses visualized.  Patient has equal pulses in bilateral upper and lower extremities.  No lower leg edema.  Labs and imaging ordered.  Ordered Zofran, morphine and Protonix for the patient's symptoms.  Right upper quadrant ultrasound  ordered initially and shows gallbladder polyps without evidence of acute cholecystitis.  CBC shows no leukocytosis, mild anemia visualized with consistent with patient's previous ED use.  CMP shows slightly decreased sodium 133.  Elevated glucose of 109 slightly decreased calcium and total protein.  Patient has an elevated total bilirubin, but his significantly history of hyperbilirubinemia.  Lipase normal.  Urinalysis normal.  Initial troponin 14 with repeat 14 delta 0.  EKG shows paced rhythm with nonspecific ST changes.  Low suspicion for ACS.  I discussed the lab and imaging findings with the patient.  Patient reports that pain is improving, he is concerned for dissection.  Reviewed past CT scan in 2017 and no dissection or aneurysm was visualized.  I do not think that it is unnecessary to perform a CT scan given the patient's age and medical history.  CT abdomen pelvis shows pericholecystic inflammatory changes.  Changes of adenomyomatosis.  Recommended correlation with liver studies which are normal.  Recommended follow-up with MRI/MRCP for further evaluation.  No pelvic masses.  Age-related arthrosclerosis calcifications involving the aorta and branch vessels.  At this time a consult for general surgery was placed.  Discussed with general surgeon who does not a consult for this reconstructive case given the patient reassuring vital signs and lack of WBC.  He recommends that the patient return to the emergency department if worsening pain reevaluation.  On reevaluation, patient reports he is at a 0-10 pain I discussed the lab and imaging findings with the patient.  Discussed the call with the general surgeon.  Strict return precautions given.  We will send patient home on a few narcotic pain medications as well as some Zofran. The patient agrees to this plan.  Patient is stable being discharged home in good condition.    I discussed this case with my attending physician who cosigned this note  including patient's presenting symptoms, physical exam, and planned diagnostics and interventions. Attending physician stated agreement with plan or made changes to plan  MDM Rules/Calculators/A&P                           Final Clinical Impression(s) / ED Diagnoses Final diagnoses:  Pain  Right upper quadrant abdominal pain    Rx / DC Orders ED Discharge Orders          Ordered    HYDROcodone-acetaminophen (NORCO/VICODIN) 5-325 MG tablet  Every 4 hours PRN        11/15/21 1426    ondansetron (ZOFRAN) 4 MG tablet  Every 6 hours        11/15/21 1433             Sherrell Puller, PA-C 11/18/21 2257    Lajean Saver, MD 11/19/21 1215

## 2021-11-15 NOTE — Discharge Instructions (Addendum)
You were seen here today for evaluation of your abdominal pain. Your abdominal CT showed some pericholecystic inflammatory changes. After speaking with General Surgery, they do not recommend any antibiotics at this time. I have prescribed you Vicodin to take as needed for pain. Additionally, you can take the Zofran as needed for nausea. If you have any worsening pain, please return to the nearest emergency department for re-evaluation. If you have any concern, new or worsening symptoms, please return to the nearest emergency department for re-evaluation.

## 2021-12-01 ENCOUNTER — Telehealth: Payer: Self-pay | Admitting: Internal Medicine

## 2021-12-01 NOTE — Telephone Encounter (Signed)
Spoke with patient regarding episode, informed him that I had reviewed the transmission and that he has been in atrial fib/flutter since 11/29/21 at 11:45 AM informed him that according to diagnostics the average V rate during AFL/Afib was 95bpm, patient was concerned about episodes of VT informed him that there were no episodes of VT recorded, patient stated that these symptoms seemed more intense that in the past, asked patient if he felt like he needed a follow up visit with a provider patient stated that he didn't think he needed one at this point but wanted Dr. Caryl Comes to be aware that symptoms were worse this time

## 2021-12-01 NOTE — Telephone Encounter (Signed)
Patient c/o Palpitations:  High priority if patient c/o lightheadedness, shortness of breath, or chest pain  How long have you had palpitations/irregular HR/ Afib? Are you having the symptoms now? no  Are you currently experiencing lightheadedness, SOB or CP? no  Do you have a history of afib (atrial fibrillation) or irregular heart rhythm? yes  Have you checked your BP or HR? (document readings if available): no  Are you experiencing any other symptoms? He states that he felt like butterflies. He said he sent in a rhythm strip on yesterday. Please advise

## 2021-12-01 NOTE — Telephone Encounter (Signed)
Called and spoke to patient. He states that he had an episode of palpitations during light yard work yesterday that last about 2 hours. Patient states that he took 20 mg of propranolol and laid down and it eventually subsided. He states that he had lightheadedness with his episode. He has a Hx of Afib and is on Eliquis and has not missed any doses. Denies having any Sx at this time. He states that given the length of the episode he sent in a transmission for review. Made patient aware that I would forward to our device team for review. Instructed the patient to let us know if his Sx changed or worsened.

## 2021-12-03 ENCOUNTER — Telehealth: Payer: Self-pay | Admitting: Allergy and Immunology

## 2021-12-03 NOTE — Telephone Encounter (Signed)
[name] called in and states he has been trying to reach [name] and can't leave messages due to her voicemail being full.  He would like to speak to her about issues he is having with his delivery of Dupixent.  He would like a call back from [name] at [phone].

## 2021-12-04 NOTE — Telephone Encounter (Signed)
Called [facility] and they advised [employer] not paying them reason for not shipping. L/M for patient to come get sample of Dupixent in or take out of office

## 2021-12-10 NOTE — Telephone Encounter (Signed)
BTW spoke to the patient about the episode last week and also advised him to get [employer] monitor

## 2021-12-11 ENCOUNTER — Ambulatory Visit (INDEPENDENT_AMBULATORY_CARE_PROVIDER_SITE_OTHER): Payer: Medicare Other

## 2021-12-11 ENCOUNTER — Other Ambulatory Visit: Payer: Self-pay

## 2021-12-11 DIAGNOSIS — L209 Atopic dermatitis, unspecified: Secondary | ICD-10-CM

## 2021-12-19 ENCOUNTER — Ambulatory Visit (INDEPENDENT_AMBULATORY_CARE_PROVIDER_SITE_OTHER): Payer: Medicare Other | Admitting: Internal Medicine

## 2021-12-19 ENCOUNTER — Encounter: Payer: Self-pay | Admitting: Internal Medicine

## 2021-12-19 ENCOUNTER — Other Ambulatory Visit: Payer: Self-pay

## 2021-12-19 VITALS — BP 126/74 | HR 79 | Ht 72.0 in | Wt 183.8 lb

## 2021-12-19 DIAGNOSIS — I4819 Other persistent atrial fibrillation: Secondary | ICD-10-CM

## 2021-12-19 DIAGNOSIS — Z950 Presence of cardiac pacemaker: Secondary | ICD-10-CM

## 2021-12-19 DIAGNOSIS — I495 Sick sinus syndrome: Secondary | ICD-10-CM | POA: Diagnosis not present

## 2021-12-19 DIAGNOSIS — I428 Other cardiomyopathies: Secondary | ICD-10-CM | POA: Diagnosis not present

## 2021-12-19 DIAGNOSIS — E782 Mixed hyperlipidemia: Secondary | ICD-10-CM

## 2021-12-19 NOTE — Progress Notes (Signed)
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 He is s/p PVI X 2 at [facility]  he is status post pacemaker for complete heart block. He  is seen now status post CRT P upgrade  Functional status is been quite stable.  There was a recent visit with Dr. [facility] regarding chest discomfort.  Felt not to be anginal.  Also some back discomfort possibly related to Repatha.  A 6-week holiday was undertaken without any change and he has resumed his Repatha.  Intercurrently however, he has suffered the tragic loss over 72-hour period of his wife from an acute abdominal catastrophe.  His son has been very positively involved.   He has a history of coronary artery disease with prior stenting; per TDS  It was elected to use long term aspirin and a NOAC and Rivaroxaban was started; no interval bleeding  In the past angina has been triggered typically by emotional events.  He had recurrent symptoms last summer.  He underwent repeat catheterization with a 95% proximal LAD lesion noted.  Treated DES  Changed BP meds ? 2/2 drug reaction to losartan Has had hx of hyponatremia in past with thiazides and concerns of hypokalemia Rx aldactazide>> his Na>>130 w low grade HA, was going to change to aldactone but blood pressures have remained in the 110-130 range.   DATE TEST EF   1/14    ECHO   EF 55-60 %   1/17    MYOVIEW   EF 47 % No  ischemia  5/17 Echo EF 40-45%   4/18 Echo EF 40-45%   9/18 Cath EF 60-65% LAD 95%>>DES; Lcx Stent Patent  1/20 Echo     1/20 LHC 45-50% LADm 95%>>0 stented   With deteriorating LV function and modest changes in functional status, we undertook CRT upgrade.   Date Cr K Na Hgb  7/17 1.06   14.4  10/18  1.10  4.8 134 14.2   10/19 1.18 4.5 130   12/22 1.04 4.4 133 12.2   It was elected to keep him on chronic antiplatelet therapy in addition to his Rivaroxaban aspirin was selected---   Today, he is seen today because his heart rates have been in the 90s.  This has been related to tracking of his atrial tachycardia occurring at a rate of about 195 bpm with 2-1 conduction and blanking of every other beat.      He complains of some chest pains at times, but believes this is musculoskeletal in nature.  He continues to walk for exercise.  The patient denies shortness of breath, nocturnal dyspnea, orthopnea or peripheral edema.  There have been no palpitations, lightheadedness or syncope.   Past Medical History:  Diagnosis Date   Atrial flutter -atypical 2007   ABLATION x2 [facility]   Atrial tachycardia ([place]) 2007   ABLATION [facility]   Benign paroxysmal vertigo    CAD (coronary artery disease)    a. abnl mv 01/2012;  b. cath 05/23/2012 LCX 80@ bifurcation w/ OM, 95% @ AV groove, otw nonobs dzs, EF 55-65%;   Carcinoma ([place])    skin cancer "off my chest"   Familial tremor    First degree AV block  had 4 ablasions that left py with heart block   History of blood transfusion 2010   "2nd day after prostatectomy"   Hyperlipidemia    Hypertension    Incontinence    DECREASING INCONTINENCE RELATED TO PROSTATECTOMY.   Migraines    Presence of permanent cardiac pacemaker 2013 ?   Prostate cancer ([place])    Pulsatile tinnitus    Tachy-brady syndrome ([place])    a. developed after RFCA @ [facility] 2013-> MDT PPM;  b. s/p RA lead revision 05/2012    Past Surgical History:  Procedure Laterality Date   ATRIAL ABLATION SURGERY  04/01/12   "4th time I had one"   CARDIAC CATHETERIZATION     CARDIOVERSION  12/03/2011   Procedure: CARDIOVERSION;  Surgeon: [name], MD;  Location: [place];  Service: Cardiovascular;  Laterality: [name];   CARDIOVERSION  01/13/2012   Procedure: CARDIOVERSION;  Surgeon: Lelon Perla,  MD;  Location: [place];  Service: Cardiovascular;  Laterality: [name];   CARDIOVERSION  04/27/2012   Procedure: CARDIOVERSION;  Surgeon: Darlin Coco, MD;  Location: [place];  Service: Cardiovascular;  Laterality: [name];   COLONOSCOPY     COLONOSCOPY WITH PROPOFOL [name] 10/29/2016   Procedure: COLONOSCOPY WITH PROPOFOL;  Surgeon: Doran Stabler, MD;  Location: [facility];  Service: Gastroenterology;  Laterality: [name];   CORONARY ANGIOPLASTY WITH STENT PLACEMENT  06/02/12   "1; first one ever"   CORONARY STENT INTERVENTION [name] 07/21/2017   Procedure: CORONARY STENT INTERVENTION;  Surgeon: [name], MD;  Location: [place] CV [facility];  Service: Cardiovascular;  Laterality: [name];   CORONARY STENT INTERVENTION [name] 12/13/2018   Procedure: CORONARY STENT INTERVENTION;  Surgeon: [name], MD;  Location: [place] [facility];  Service: Cardiovascular;  Laterality: [name];   EP IMPLANTABLE DEVICE [name] 06/17/2016   Procedure: BiV Pacemaker upgrade;  Surgeon: [name], MD;  Location: [place] [facility];  Service: Cardiovascular;  Laterality: [name];   HERNIA REPAIR  1980's   INGUINAL HERNIA REPAIR  1945   left side   INSERT / REPLACE / REMOVE PACEMAKER  03/2012   initial placement   INSERT / REPLACE / REMOVE PACEMAKER  05/25/12   "atrial lead change"   LEAD REVISION [name] 05/25/2012   Procedure: LEAD REVISION;  Surgeon: Evans Lance, MD;  [facility];  Service: Cardiovascular;  Laterality: [name];   LEFT HEART CATH AND CORONARY ANGIOGRAPHY [name] 07/21/2017   Procedure: LEFT HEART CATH AND CORONARY ANGIOGRAPHY;  Surgeon: [name], MD;  Location: [place] [facility];  Service: Cardiovascular;  Laterality: [name];   LEFT HEART CATH AND CORONARY ANGIOGRAPHY [name] 12/13/2018   Procedure: LEFT HEART CATH AND CORONARY ANGIOGRAPHY;  Surgeon: [name], MD;  Location: [place] [facility];  Service: Cardiovascular;  Laterality: [name];   PERCUTANEOUS CORONARY STENT INTERVENTION (PCI-S) [name] 06/02/2012    Procedure: PERCUTANEOUS CORONARY STENT INTERVENTION (PCI-S);  Surgeon: Hillary Bow, MD;  Location[facility] CATH LAB;  Service: Cardiovascular;  Laterality: [name];   PROSTATECTOMY  05/19/1286   COMPLICATED   SHOULDER SURGERY     Bilateral; "boney spurs removed"   SKIN CANCER EXCISION     "in situ; right chest"   TONSILLECTOMY AND ADENOIDECTOMY     "as a child"    Current Outpatient Medications  Medication Sig Dispense Refill   acyclovir (ZOVIRAX) 400 MG tablet Take 400 mg by mouth as needed.     apixaban (ELIQUIS) 5 MG TABS tablet Take 1 tablet (5  mg total) by mouth 2 (two) times daily. 60 tablet 3   aspirin EC 81 MG tablet Take 1 tablet (81 mg total) by mouth daily. Swallow whole.     cetirizine (ZYRTEC) 10 MG tablet Take 10 mg by mouth daily.      DUPIXENT 300 MG/2ML prefilled syringe Inject 1 pen/syringe subcutaneously every other week 4 mL 11   EPINEPHrine 0.3 mg/0.3 mL IJ SOAJ injection Inject 0.3 mLs into the skin 1 (one) time orthopaedic injection.     losartan (COZAAR) 25 MG tablet Take 25 mg by mouth daily.     metroNIDAZOLE (METROGEL) 0.75 % gel Apply 1 application topically as needed.     nitroGLYCERIN (NITROSTAT) 0.4 MG SL tablet Place 0.4 mg under the tongue every 5 (five) minutes as needed for chest pain (x 3 tabs).     propranolol (INDERAL) 10 MG tablet 1 tablet     REPATHA SURECLICK 295 MG/ML SOAJ INJECT 1 PEN INTO THE SKIN EVERY 14 DAYS 2 mL 11   Current Facility-Administered Medications  Medication Dose Route Frequency Provider Last Rate Last Admin   dupilumab (DUPIXENT) prefilled syringe 300 mg  300 mg Subcutaneous Q14 Days Kozlow, Donnamarie Poag, MD   300 mg at 12/11/21 1121    Allergies  Allergen Reactions   Ceclor [Cefaclor] Hives   Chlorhexidine Itching and Rash   Ace Inhibitors Other (See Comments)    Does not take due to "angiotensin is responsible for maintaining bladder sphincter tone"   Amlodipine     Other reaction(s): lethargy   Xarelto [Rivaroxaban]      Review of Systems negative except from HPI and PMH  Physical Exam  BP 126/74 (BP Location: Right Arm, Patient Position: Sitting, Cuff Size: Normal)    Pulse 79    Ht 6' (1.829 m)    Wt 183 lb 12.8 oz (83.4 kg)    BMI 24.93 kg/m  Well developed and well nourished in no acute distress HENT normal Neck supple with JVP-flat Clear Device pocket well healed; without hematoma or erythema.  There is no tethering  Regular rate and rhythm, no murmur Abd-soft with active BS No Clubbing cyanosis  edema Skin-warm and dry A & Oriented  Grossly normal sensory and motor function     Assessment and  Plan  Atrial flutter/fib  Atrial fibrillation s/p ablation [facility] x2  Premature JUNCTIONAL BEATS/Atrial beatsPVCs  Sinus node dysfunction  Cardiomyopathy-mild-resolved  Pacemaker CRT - [employer]     Coronary artery disease s/p  DES stenting 2013/2020>>long term dual therapy recommended per Dr Lilly Cove    Hypertension  Grief    Blood pressures well controlled.  We will continue him on losartan 25.  Recurrent atrial arrhythmias.  No bleeding.  We will continue him on apixaban 5 mg.  Given his coronary disease, it was elected to keep him on long-term aspirin in addition to his apixaban.  We will continue this at 81 mg.  He is on Repatha for his hyperlipidemia.  Because of his tracking of his atrial flutter/tachycardia, we have programmed his device in the DDIR mode.  We have reviewed the potential issues related to the 10% of his atrial beats that are not paced and that there may be loss of AV synchrony with this.  It is not clear to me whether this occurs at slow rate or fast rates.  The trade off is that his heart rates will be controlled by then he is pacer when he is in the tachycardia at a  rate of about 70.  The other hope had been that we could use his device as an antitachycardia pacing system for the termination of his atrial flutter.  Unfortunately, I tried multiple different algorithms  and durations to terminate his atrial flutter; I succeeded in accelerating it and then succeeded in turn it into fibrillation for just a moment and then it settled back into a very fast atrial flutter.  He is euvolemic.  We will continue him off diuretics.     Blood pressure is well controlled.  Continue on losartan 25.  Cardiomyopathy is modestly worsened.  We will continue on his Cozaar 25; also has more ectopy on his electrocardiogram but review of his device suggests that the burden is actually quite small.  He has been on Entresto a couple of times before and has not tolerated.  There may be a role for spironolactone there have been multiple discussions in the past regarding beta-blockers.  I will reach out to Dr. [facility] and chat with him regarding drug adjustments.  It may also be prudent prior to this to repeat an echocardiogram as there may have been some hibernating myocardium at the time because of the high-grade LAD lesion     I,Mathew Stumpf,acting as a scribe for Virl Axe, MD.,have documented all relevant documentation on the behalf of Virl Axe, MD,as directed by  Virl Axe, MD while in the presence of Virl Axe, MD. I, Virl Axe, MD, have reviewed all documentation for this visit. The documentation on 12/19/21 for the exam, diagnosis, procedures, and orders are all accurate and complete.

## 2021-12-19 NOTE — Patient Instructions (Addendum)
Medication Instructions:  Your physician recommends that you continue on your current medications as directed. Please refer to the Current Medication list given to you today.  *If you need a refill on your cardiac medications before your next appointment, please call your pharmacy*   Lab Work: Lipid Panel today   If you have labs (blood work) drawn today and your tests are completely normal, you will receive your results only by: [place] (if you have MyChart) OR A paper copy in the mail If you have any lab test that is abnormal or we need to change your treatment, we will call you to review the results.   Testing/Procedures: None ordered.    Follow-Up: At [facility], you and your health needs are our priority.  As part of our continuing mission to provide you with exceptional heart care, we have created designated Provider Care Teams.  These Care Teams include your primary Cardiologist (physician) and Advanced Practice Providers (APPs -  Physician Assistants and Nurse Practitioners) who all work together to provide you with the care you need, when you need it.  We recommend signing up for the patient portal called "MyChart".  Sign up information is provided on this After Visit Summary.  MyChart is used to connect with patients for Virtual Visits (Telemedicine).  Patients are able to view lab/test results, encounter notes, upcoming appointments, etc.  Non-urgent messages can be sent to your provider as well.   To learn more about what you can do with MyChart, go to [email or web address].    Your next appointment:   6 months with Dr Caryl Comes

## 2021-12-20 LAB — LIPID PANEL
Chol/HDL Ratio: 1.9 ratio (ref 0.0–5.0)
Cholesterol, Total: 138 mg/dL (ref 100–199)
HDL: 73 mg/dL (ref >39)
LDL Chol Calc (NIH): 54 mg/dL (ref 0–99)
Triglycerides: 45 mg/dL (ref 0–149)
VLDL Cholesterol Cal: 11 mg/dL (ref 5–40)

## 2021-12-31 ENCOUNTER — Ambulatory Visit (INDEPENDENT_AMBULATORY_CARE_PROVIDER_SITE_OTHER): Payer: Medicare Other

## 2021-12-31 ENCOUNTER — Other Ambulatory Visit: Payer: Self-pay

## 2021-12-31 DIAGNOSIS — L209 Atopic dermatitis, unspecified: Secondary | ICD-10-CM

## 2022-01-14 ENCOUNTER — Ambulatory Visit (INDEPENDENT_AMBULATORY_CARE_PROVIDER_SITE_OTHER): Payer: Medicare Other

## 2022-01-14 ENCOUNTER — Other Ambulatory Visit: Payer: Self-pay

## 2022-01-14 DIAGNOSIS — L209 Atopic dermatitis, unspecified: Secondary | ICD-10-CM | POA: Diagnosis not present

## 2022-01-14 DIAGNOSIS — I495 Sick sinus syndrome: Secondary | ICD-10-CM | POA: Diagnosis not present

## 2022-01-14 LAB — CUP PACEART REMOTE DEVICE CHECK
Battery Remaining Longevity: 39 mo
Battery Voltage: 2.93 V
Brady Statistic AP VP Percent: 71.81 %
Brady Statistic AP VS Percent: 0.05 %
Brady Statistic AS VP Percent: 27.32 %
Brady Statistic AS VS Percent: 0.89 %
Brady Statistic RA Percent Paced: 11.18 %
Brady Statistic RV Percent Paced: 99.42 %
Date Time Interrogation Session: 20230301045136
Implantable Lead Implant Date: 20130517
Implantable Lead Implant Date: 20130710
Implantable Lead Implant Date: 20170802
Implantable Lead Location: 753858
Implantable Lead Location: 753859
Implantable Lead Location: 753860
Implantable Pulse Generator Implant Date: 20170802
Lead Channel Impedance Value: 1007 Ohm
Lead Channel Impedance Value: 1083 Ohm
Lead Channel Impedance Value: 1121 Ohm
Lead Channel Impedance Value: 342 Ohm
Lead Channel Impedance Value: 342 Ohm
Lead Channel Impedance Value: 418 Ohm
Lead Channel Impedance Value: 437 Ohm
Lead Channel Impedance Value: 456 Ohm
Lead Channel Impedance Value: 513 Ohm
Lead Channel Impedance Value: 570 Ohm
Lead Channel Impedance Value: 646 Ohm
Lead Channel Impedance Value: 817 Ohm
Lead Channel Impedance Value: 912 Ohm
Lead Channel Impedance Value: 931 Ohm
Lead Channel Pacing Threshold Amplitude: 0.75 V
Lead Channel Pacing Threshold Amplitude: 1 V
Lead Channel Pacing Threshold Amplitude: 1.375 V
Lead Channel Pacing Threshold Pulse Width: 0.4 ms
Lead Channel Pacing Threshold Pulse Width: 0.4 ms
Lead Channel Pacing Threshold Pulse Width: 0.8 ms
Lead Channel Sensing Intrinsic Amplitude: 1.125 mV
Lead Channel Sensing Intrinsic Amplitude: 1.125 mV
Lead Channel Sensing Intrinsic Amplitude: 14.25 mV
Lead Channel Sensing Intrinsic Amplitude: 14.25 mV
Lead Channel Setting Pacing Amplitude: 1.75 V
Lead Channel Setting Pacing Amplitude: 2 V
Lead Channel Setting Pacing Amplitude: 2.5 V
Lead Channel Setting Pacing Pulse Width: 0.4 ms
Lead Channel Setting Pacing Pulse Width: 1 ms
Lead Channel Setting Sensing Sensitivity: 4 mV

## 2022-01-15 ENCOUNTER — Ambulatory Visit: Payer: Medicare Other

## 2022-01-16 ENCOUNTER — Ambulatory Visit: Payer: Medicare Other

## 2022-01-21 NOTE — Progress Notes (Signed)
Remote pacemaker transmission.   

## 2022-01-28 ENCOUNTER — Other Ambulatory Visit: Payer: Self-pay

## 2022-01-28 ENCOUNTER — Ambulatory Visit (INDEPENDENT_AMBULATORY_CARE_PROVIDER_SITE_OTHER): Payer: Medicare Other

## 2022-01-28 DIAGNOSIS — L209 Atopic dermatitis, unspecified: Secondary | ICD-10-CM | POA: Diagnosis not present

## 2022-02-11 ENCOUNTER — Ambulatory Visit (INDEPENDENT_AMBULATORY_CARE_PROVIDER_SITE_OTHER): Payer: Medicare Other

## 2022-02-11 ENCOUNTER — Other Ambulatory Visit: Payer: Self-pay

## 2022-02-11 DIAGNOSIS — L209 Atopic dermatitis, unspecified: Secondary | ICD-10-CM | POA: Diagnosis not present

## 2022-02-25 ENCOUNTER — Ambulatory Visit (INDEPENDENT_AMBULATORY_CARE_PROVIDER_SITE_OTHER): Payer: Medicare Other | Admitting: *Deleted

## 2022-02-25 DIAGNOSIS — L209 Atopic dermatitis, unspecified: Secondary | ICD-10-CM

## 2022-03-11 ENCOUNTER — Ambulatory Visit (INDEPENDENT_AMBULATORY_CARE_PROVIDER_SITE_OTHER): Payer: Medicare Other | Admitting: *Deleted

## 2022-03-11 DIAGNOSIS — L209 Atopic dermatitis, unspecified: Secondary | ICD-10-CM

## 2022-03-26 ENCOUNTER — Ambulatory Visit (INDEPENDENT_AMBULATORY_CARE_PROVIDER_SITE_OTHER): Payer: Medicare Other

## 2022-03-26 DIAGNOSIS — L209 Atopic dermatitis, unspecified: Secondary | ICD-10-CM

## 2022-04-03 ENCOUNTER — Encounter: Payer: Self-pay | Admitting: Cardiovascular Disease

## 2022-04-03 ENCOUNTER — Ambulatory Visit (INDEPENDENT_AMBULATORY_CARE_PROVIDER_SITE_OTHER): Payer: Medicare Other | Admitting: Cardiovascular Disease

## 2022-04-03 VITALS — BP 120/84 | HR 72 | Ht 72.0 in | Wt 181.4 lb

## 2022-04-03 DIAGNOSIS — I428 Other cardiomyopathies: Secondary | ICD-10-CM

## 2022-04-03 DIAGNOSIS — I25118 Atherosclerotic heart disease of native coronary artery with other forms of angina pectoris: Secondary | ICD-10-CM

## 2022-04-03 DIAGNOSIS — E782 Mixed hyperlipidemia: Secondary | ICD-10-CM

## 2022-04-03 NOTE — Progress Notes (Signed)
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  The patient has been followed by Dr. Caryl Comes for atrial fibrillation and he has a history of A. fib ablation.  He has undergone stenting of the LAD and left circumflex in the past.  He was found to have a new lesion at the time of his most recent heart catheterization in January 2020.  He again underwent PCI using a drug-eluting stent.  He has been maintained on a combination of apixaban for atrial fibrillation and aspirin for coronary artery disease.   The patient is here alone today.  He has had episodes of left arm discomfort with walking.  He experiences pain in the back of the left arm sometimes down to the hand.  He has not had any chest pain or pressure recently.  He walks his dog with the left arm holding the leash so he is not sure if his symptoms are cardiac or musculoskeletal.  Stress testing has been very inaccurate for him in the past and he is not inclined to have a stress test at this time.  He denies dyspnea, orthopnea, or PND.  He complains of pulsatile tinnitus and feeling loud heartbeat.  No edema, orthopnea, or PND.  Past Medical History:  Diagnosis Date   Atrial flutter -atypical 2007   ABLATION x2 [facility]   Atrial tachycardia ([place]) 2007   ABLATION [facility]   Benign paroxysmal vertigo    CAD (coronary artery disease)    a. abnl mv 01/2012;  b. cath 05/23/2012 LCX 80@ bifurcation w/ OM, 95% @ AV groove, otw nonobs dzs, EF 55-65%;   Carcinoma ([place])    skin cancer "off my chest"   Familial tremor    First  degree AV block    had 4 ablasions that left py with heart block   History of blood transfusion 2010   "2nd day after prostatectomy"   Hyperlipidemia    Hypertension    Incontinence    DECREASING INCONTINENCE RELATED TO PROSTATECTOMY.   Migraines    Presence of permanent cardiac pacemaker 2013 ?   Prostate cancer ([place])    Pulsatile tinnitus    Tachy-brady syndrome ([place])    a. developed after RFCA @ [facility] 2013-> MDT PPM;  b. s/p RA lead revision 05/2012    Past Surgical History:  Procedure Laterality Date   ATRIAL ABLATION SURGERY  04/01/12   "4th time I had one"   CARDIAC CATHETERIZATION     CARDIOVERSION  12/03/2011   Procedure: CARDIOVERSION;  Surgeon: [name], MD;  Location: [place];  Service: Cardiovascular;  Laterality: [name];   CARDIOVERSION  01/13/2012   Procedure: CARDIOVERSION;  Surgeon: Lelon Perla, MD;  Location: [place];  Service: Cardiovascular;  Laterality: [name];   CARDIOVERSION  04/27/2012   Procedure: CARDIOVERSION;  Surgeon: Darlin Coco, MD;  Location: [place];  Service: Cardiovascular;  Laterality: [name];   COLONOSCOPY     COLONOSCOPY WITH PROPOFOL [name] 10/29/2016   Procedure: COLONOSCOPY WITH PROPOFOL;  Surgeon: Doran Stabler, MD;  Location: [facility];  Service: Gastroenterology;  Laterality: [name];   CORONARY ANGIOPLASTY WITH STENT PLACEMENT  06/02/12   "1; first one ever"   CORONARY STENT INTERVENTION [name] 07/21/2017   Procedure: CORONARY STENT INTERVENTION;  Surgeon: [name], MD;  Location: [place] CV [facility];  Service: Cardiovascular;  Laterality: [name];   CORONARY STENT INTERVENTION [name] 12/13/2018   Procedure: CORONARY STENT INTERVENTION;  Surgeon: [name], MD;  Location: [place] [facility];  Service: Cardiovascular;  Laterality: [name];   EP IMPLANTABLE DEVICE [name] 06/17/2016   Procedure: BiV Pacemaker upgrade;  Surgeon: [name], MD;  Location: [place] [facility];  Service: Cardiovascular;  Laterality: [name];   HERNIA REPAIR  1980's    INGUINAL HERNIA REPAIR  1945   left side   INSERT / REPLACE / REMOVE PACEMAKER  03/2012   initial placement   INSERT / REPLACE / REMOVE PACEMAKER  05/25/12   "atrial lead change"   LEAD REVISION [name] 05/25/2012   Procedure: LEAD REVISION;  Surgeon: Evans Lance, MD;  Location: [facility] CATH LAB;  Service: Cardiovascular;  Laterality: [name];   LEFT HEART CATH AND CORONARY ANGIOGRAPHY [name] 07/21/2017   Procedure: LEFT HEART CATH AND CORONARY ANGIOGRAPHY;  Surgeon: [name], MD;  Location: [place] CV [facility];  Service: Cardiovascular;  Laterality: [name];   LEFT HEART CATH AND CORONARY ANGIOGRAPHY [name] 12/13/2018   Procedure: LEFT HEART CATH AND CORONARY ANGIOGRAPHY;  Surgeon: [name], MD;  Location: [place] [facility];  Service: Cardiovascular;  Laterality: [name];   PERCUTANEOUS CORONARY STENT INTERVENTION (PCI-S) [name] 06/02/2012   Procedure: PERCUTANEOUS CORONARY STENT INTERVENTION (PCI-S);  Surgeon: Hillary Bow, MD;  Location: [facility] C[facility] LAB;  Service: Cardiovascular;  Laterality: [name];   PROSTATECTOMY  02/14/6605   COMPLICATED   SHOULDER SURGERY     Bilateral; "boney spurs removed"   SKIN CANCER EXCISION     "in situ; right chest"   TONSILLECTOMY AND ADENOIDECTOMY     "as a child"    Current Medications: Current Meds  Medication Sig   acyclovir (ZOVIRAX) 400 MG tablet Take 400 mg by mouth as needed.   apixaban (ELIQUIS) 5 MG TABS tablet Take 1 tablet (5 mg total) by mouth 2 (two) times daily.   aspirin EC 81 MG tablet Take 1 tablet (81 mg total) by mouth daily. Swallow whole.   cetirizine (ZYRTEC) 10 MG tablet Take 10 mg by mouth daily.    Cholecalciferol 25 MCG (1000 UT) tablet TAKE ONE TABLET BY MOUTH DAILY FOR LOW VITAMIN D   DUPIXENT 300 MG/2ML prefilled syringe Inject 1 pen/syringe subcutaneously every other week   EPINEPHrine 0.3 mg/0.3 mL IJ SOAJ injection Inject 0.3 mLs into the skin 1 (one) time orthopaedic injection.   losartan (COZAAR) 25 MG tablet Take 25 mg by mouth daily.    metroNIDAZOLE (METROGEL) 0.75 % gel Apply 1 application topically as needed.   nitroGLYCERIN (NITROSTAT) 0.4 MG SL tablet Place 0.4 mg under the tongue every 5 (five) minutes as needed for chest pain (x 3 tabs).   propranolol (INDERAL) 10 MG tablet 1 tablet   REPATHA SURECLICK 301 MG/ML SOAJ INJECT 1 PEN INTO THE SKIN EVERY 14 DAYS   Current Facility-Administered Medications for the 04/03/22 encounter (Office Visit) with [name], MD  Medication   dupilumab (DUPIXENT) prefilled syringe 300 mg     Allergies:   Ceclor [cefaclor], Chlorhexidine, Ace inhibitors, Amlodipine, and Xarelto [rivaroxaban]   Social History  Socioeconomic History   Marital status: Married    Spouse name: Not on file   Number of children: Not on file   Years of education: Not on file   Highest education level: Not on file  Occupational History   Occupation: Physician    Comment: Retired - ENT  Tobacco Use   Smoking status: Never   Smokeless tobacco: Never  Vaping Use   Vaping Use: Never used  Substance and Sexual Activity   Alcohol use: Yes    Alcohol/week: 3.0 standard drinks    Types: 3 Standard drinks or equivalent per week    Comment: drinks alcohol at least once every 3 months 05/25/12 "last alcohol 02/2012"   Drug use: No   Sexual activity: Not Currently  Other Topics Concern   Not on file  Social History Narrative   Retired ENT   Social Determinants of Health   Financial Resource Strain: Not on file  Food Insecurity: Not on file  Transportation Needs: Not on file  Physical Activity: Not on file  Stress: Not on file  Social Connections: Not on file     Family History: The patient's family history includes Colon cancer in his sister; Diabetes in his brother; Heart disease in his brother and father. There is no history of Colon polyps, Rectal cancer, Stomach cancer, or Esophageal cancer.  ROS:   Please see the history of present illness.    All other systems reviewed and are  negative.  EKGs/Labs/Other Studies Reviewed:    The following studies were reviewed today: Cardiac catheterization images from 2020 were personally reviewed with the patient this morning.  Last echo, also from 2020, demonstrated mild LV systolic dysfunction with LVEF 45 to 50%.  EKG:  EKG is not ordered today.   Recent Labs: 11/15/2021: ALT 25; BUN 14; Creatinine, Ser 1.04; Hemoglobin 12.2; Platelets 168; Potassium 4.4; Sodium 133  Recent Lipid Panel    Component Value Date/Time   CHOL 138 12/19/2021 1227   TRIG 45 12/19/2021 1227   HDL 73 12/19/2021 1227   CHOLHDL 1.9 12/19/2021 1227   LDLCALC 54 12/19/2021 1227   LDLDIRECT 130 (H) 09/13/2017 1358     Risk Assessment/Calculations:    CHA2DS2-VASc Score = 5   This indicates a 7.2% annual risk of stroke. The patient's score is based upon: CHF History: 1 HTN History: 1 Diabetes History: 0 Stroke History: 0 Vascular Disease History: 1 Age Score: 2 Gender Score: 0          Physical Exam:    VS:  BP 120/84   Pulse 72   Ht 6' (1.829 m)   Wt 181 lb 6.4 oz (82.3 kg)   SpO2 99%   BMI 24.60 kg/m     Wt Readings from Last 3 Encounters:  04/03/22 181 lb 6.4 oz (82.3 kg)  12/19/21 183 lb 12.8 oz (83.4 kg)  11/15/21 181 lb (82.1 kg)     GEN:  Well nourished, well developed in no acute distress HEENT: Normal NECK: No JVD; No carotid bruits LYMPHATICS: No lymphadenopathy CARDIAC: RRR, no murmurs, rubs, gallops RESPIRATORY:  Clear to auscultation without rales, wheezing or rhonchi  ABDOMEN: Soft, non-tender, non-distended MUSCULOSKELETAL:  No edema; No deformity  SKIN: Warm and dry NEUROLOGIC:  Alert and oriented x 3 PSYCHIATRIC:  Normal affect   ASSESSMENT:    1. Cardiomyopathy, nonischemic ([place])   2. Coronary artery disease of native artery of native heart with stable angina pectoris ([place])   3. Mixed hyperlipidemia  PLAN:    In order of problems listed above:  Patient treated with losartan 25 mg daily.   Has has been on Inderal.  Reviewed medications and he has been on spironolactone and Entresto in the past with some issues with each of these medications.  I think we should update an echocardiogram and consider whether he might benefit from titration of his medical program.  He does not recall a specific side effect of Entresto and I will have to go back and review the notes further to sort this out. Typical and atypical features.  We discussed relook cardiac catheterization.  The patient is not interested in stress testing as this has not been accurate even with nuclear stress testing in the past.  He will walk without his dog to see if this causes left arm discomfort.  I went back and reviewed his past presentations, all of which included some degree of chest discomfort which he is not currently experiencing.  He will continue on aspirin for antiplatelet therapy and Repatha for lipid lowering. Treated with Repatha.  LDL cholesterol is at goal.  Most recent labs reviewed with an LDL of 54.  The patient will contact me with any progressive anginal symptoms.  Otherwise I will see him back in 6 months.     Medication Adjustments/Labs and Tests Ordered: Current medicines are reviewed at length with the patient today.  Concerns regarding medicines are outlined above.  Orders Placed This Encounter  Procedures   ECHOCARDIOGRAM COMPLETE   No orders of the defined types were placed in this encounter.   Patient Instructions  Medication Instructions:  Your physician recommends that you continue on your current medications as directed. Please refer to the Current Medication list given to you today.  *If you need a refill on your cardiac medications before your next appointment, please call your pharmacy*   Lab Work: NONE If you have labs (blood work) drawn today and your tests are completely normal, you will receive your results only by: [place] (if you have MyChart) OR A paper copy in the  mail If you have any lab test that is abnormal or we need to change your treatment, we will call you to review the results.   Testing/Procedures: ECHO Your physician has requested that you have an echocardiogram. Echocardiography is a painless test that uses sound waves to create images of your heart. It provides your doctor with information about the size and shape of your heart and how well your heart's chambers and valves are working. This procedure takes approximately one hour. There are no restrictions for this procedure.    Follow-Up: At [facility], you and your health needs are our priority.  As part of our continuing mission to provide you with exceptional heart care, we have created designated Provider Care Teams.  These Care Teams include your primary Cardiologist (physician) and Advanced Practice Providers (APPs -  Physician Assistants and Nurse Practitioners) who all work together to provide you with the care you need, when you need it.  We recommend signing up for the patient portal called "MyChart".  Sign up information is provided on this After Visit Summary.  MyChart is used to connect with patients for Virtual Visits (Telemedicine).  Patients are able to view lab/test results, encounter notes, upcoming appointments, etc.  Non-urgent messages can be sent to your provider as well.   To learn more about what you can do with MyChart, go to [email or web address].    Your next appointment:  6 month(s)  The format for your next appointment:   In Person  Provider:   [name], MD       Important Information About Sugar         Signed, [name], MD  04/06/2022 10:54 AM    [place]

## 2022-04-03 NOTE — Patient Instructions (Signed)
Medication Instructions:  Your physician recommends that you continue on your current medications as directed. Please refer to the Current Medication list given to you today.  *If you need a refill on your cardiac medications before your next appointment, please call your pharmacy*   Lab Work: NONE If you have labs (blood work) drawn today and your tests are completely normal, you will receive your results only by: [place] (if you have MyChart) OR A paper copy in the mail If you have any lab test that is abnormal or we need to change your treatment, we will call you to review the results.   Testing/Procedures: ECHO Your physician has requested that you have an echocardiogram. Echocardiography is a painless test that uses sound waves to create images of your heart. It provides your doctor with information about the size and shape of your heart and how well your heart's chambers and valves are working. This procedure takes approximately one hour. There are no restrictions for this procedure.    Follow-Up: At [facility], you and your health needs are our priority.  As part of our continuing mission to provide you with exceptional heart care, we have created designated Provider Care Teams.  These Care Teams include your primary Cardiologist (physician) and Advanced Practice Providers (APPs -  Physician Assistants and Nurse Practitioners) who all work together to provide you with the care you need, when you need it.  We recommend signing up for the patient portal called "MyChart".  Sign up information is provided on this After Visit Summary.  MyChart is used to connect with patients for Virtual Visits (Telemedicine).  Patients are able to view lab/test results, encounter notes, upcoming appointments, etc.  Non-urgent messages can be sent to your provider as well.   To learn more about what you can do with MyChart, go to [email or web address].    Your next appointment:   6  month(s)  The format for your next appointment:   In Person  Provider:   [name], MD       Important Information About Sugar

## 2022-04-08 ENCOUNTER — Ambulatory Visit (INDEPENDENT_AMBULATORY_CARE_PROVIDER_SITE_OTHER): Payer: Medicare Other

## 2022-04-08 DIAGNOSIS — L209 Atopic dermatitis, unspecified: Secondary | ICD-10-CM

## 2022-04-14 ENCOUNTER — Other Ambulatory Visit: Payer: Self-pay | Admitting: *Deleted

## 2022-04-14 MED ORDER — DUPIXENT 300 MG/2ML ~~LOC~~ SOSY: PREFILLED_SYRINGE | SUBCUTANEOUS | 11 refills | Status: DC

## 2022-04-15 ENCOUNTER — Ambulatory Visit (INDEPENDENT_AMBULATORY_CARE_PROVIDER_SITE_OTHER): Payer: Medicare Other

## 2022-04-15 DIAGNOSIS — I428 Other cardiomyopathies: Secondary | ICD-10-CM | POA: Diagnosis not present

## 2022-04-16 LAB — CUP PACEART REMOTE DEVICE CHECK
Battery Remaining Longevity: 36 mo
Battery Voltage: 2.92 V
Brady Statistic AP VP Percent: 62.71 %
Brady Statistic AP VS Percent: 0.32 %
Brady Statistic AS VP Percent: 35.72 %
Brady Statistic AS VS Percent: 1.27 %
Brady Statistic RA Percent Paced: 20.62 %
Brady Statistic RV Percent Paced: 98.82 %
Date Time Interrogation Session: 20230531013800
Implantable Lead Implant Date: 20130517
Implantable Lead Implant Date: 20130710
Implantable Lead Implant Date: 20170802
Implantable Lead Location: 753858
Implantable Lead Location: 753859
Implantable Lead Location: 753860
Implantable Pulse Generator Implant Date: 20170802
Lead Channel Impedance Value: 1007 Ohm
Lead Channel Impedance Value: 1083 Ohm
Lead Channel Impedance Value: 1121 Ohm
Lead Channel Impedance Value: 342 Ohm
Lead Channel Impedance Value: 380 Ohm
Lead Channel Impedance Value: 418 Ohm
Lead Channel Impedance Value: 475 Ohm
Lead Channel Impedance Value: 475 Ohm
Lead Channel Impedance Value: 494 Ohm
Lead Channel Impedance Value: 551 Ohm
Lead Channel Impedance Value: 665 Ohm
Lead Channel Impedance Value: 798 Ohm
Lead Channel Impedance Value: 874 Ohm
Lead Channel Impedance Value: 912 Ohm
Lead Channel Pacing Threshold Amplitude: 0.75 V
Lead Channel Pacing Threshold Amplitude: 1 V
Lead Channel Pacing Threshold Amplitude: 1.375 V
Lead Channel Pacing Threshold Pulse Width: 0.4 ms
Lead Channel Pacing Threshold Pulse Width: 0.4 ms
Lead Channel Pacing Threshold Pulse Width: 0.8 ms
Lead Channel Sensing Intrinsic Amplitude: 1.25 mV
Lead Channel Sensing Intrinsic Amplitude: 1.25 mV
Lead Channel Sensing Intrinsic Amplitude: 14.25 mV
Lead Channel Sensing Intrinsic Amplitude: 14.25 mV
Lead Channel Setting Pacing Amplitude: 1.75 V
Lead Channel Setting Pacing Amplitude: 2 V
Lead Channel Setting Pacing Amplitude: 2.5 V
Lead Channel Setting Pacing Pulse Width: 0.4 ms
Lead Channel Setting Pacing Pulse Width: 1 ms
Lead Channel Setting Sensing Sensitivity: 4 mV

## 2022-04-21 ENCOUNTER — Other Ambulatory Visit: Payer: Self-pay

## 2022-04-21 ENCOUNTER — Ambulatory Visit (INDEPENDENT_AMBULATORY_CARE_PROVIDER_SITE_OTHER): Payer: Medicare Other | Admitting: Allergy and Immunology

## 2022-04-21 ENCOUNTER — Encounter: Payer: Self-pay | Admitting: Allergy and Immunology

## 2022-04-21 VITALS — BP 118/78 | HR 72 | Temp 97.1°F | Resp 18 | Ht 71.0 in | Wt 182.0 lb

## 2022-04-21 DIAGNOSIS — I25118 Atherosclerotic heart disease of native coronary artery with other forms of angina pectoris: Secondary | ICD-10-CM

## 2022-04-21 DIAGNOSIS — L2089 Other atopic dermatitis: Secondary | ICD-10-CM | POA: Diagnosis not present

## 2022-04-21 MED ORDER — EPINEPHRINE 0.3 MG/0.3ML IJ SOAJ: 0.3000 mg | INTRAMUSCULAR | 1 refills | Status: DC | PRN

## 2022-04-21 MED ORDER — CETIRIZINE HCL 10 MG PO TABS: 10.0000 mg | ORAL_TABLET | Freq: Two times a day (BID) | ORAL | 11 refills | Status: AC

## 2022-04-21 MED ORDER — FLUOCINONIDE 0.05 % EX OINT: 1.0000 "application " | TOPICAL_OINTMENT | Freq: Two times a day (BID) | CUTANEOUS | 11 refills | Status: DC

## 2022-04-21 NOTE — Patient Instructions (Signed)
  1.  Continue dupilumab injections every 2 weeks  2.  If needed:   A.  Cetirizine 10 mg - 1 tablet 1-2 times per day  B.  Topical fluocinonide ointment 1-7 times per week  3.  Return to clinic in 1 year or earlier if problem

## 2022-04-21 NOTE — Progress Notes (Unsigned)
[name]   Follow-up Note  Referring Provider: [name], MD Primary Provider: [name], MD Date of Office Visit: 04/21/2022  Subjective:   Walter Mt, MD (DOB: [birth year]) is a 76 y.o. male who returns to the [facility] [place] on 04/21/2022 in re-evaluation of the following:  HPI: [name] presents to this clinic in reevaluation of atopic dermatitis/pruritic disorder.  His last visit to this clinic was 15 Apr 2021.  While continuing to use dupilumab injections every 2 weeks he has had excellent control of his skin condition and does not require any additional topical steroid administration and overall is very satisfied with the response that he is receiving with his treatments.  Allergies as of 04/21/2022       Reactions   Ceclor [cefaclor] Hives   Chlorhexidine Itching, Rash   Ace Inhibitors Other (See Comments)   Does not take due to "angiotensin is responsible for maintaining bladder sphincter tone"   Amlodipine    Other reaction(s): lethargy   Xarelto [rivaroxaban]         Medication List    acyclovir 400 MG tablet Commonly known as: ZOVIRAX Take 400 mg by mouth as needed.   apixaban 5 MG Tabs tablet Commonly known as: ELIQUIS Take 1 tablet (5 mg total) by mouth 2 (two) times daily.   aspirin EC 81 MG tablet Take 1 tablet (81 mg total) by mouth daily. Swallow whole.   cetirizine 10 MG tablet Commonly known as: ZYRTEC Take 10 mg by mouth daily.   Cholecalciferol 25 MCG (1000 UT) tablet TAKE ONE TABLET BY MOUTH DAILY FOR LOW VITAMIN D   Dupixent 300 MG/2ML prefilled syringe Generic drug: dupilumab Inject 1 pen/syringe subcutaneously every other week   EPINEPHrine 0.3 mg/0.3 mL Soaj injection Commonly known as: EPI-PEN Inject 0.3 mLs into the skin 1 (one) time orthopaedic injection.   losartan 25 MG tablet Commonly known as: COZAAR Take 25 mg by mouth daily.   metroNIDAZOLE 0.75 %  gel Commonly known as: METROGEL Apply 1 application topically as needed.   nitroGLYCERIN 0.4 MG SL tablet Commonly known as: NITROSTAT Place 0.4 mg under the tongue every 5 (five) minutes as needed for chest pain (x 3 tabs).   propranolol 10 MG tablet Commonly known as: INDERAL 1 tablet   Repatha SureClick 623 MG/ML Soaj Generic drug: Evolocumab INJECT 1 PEN INTO THE SKIN EVERY 14 DAYS    Past Medical History:  Diagnosis Date   Atrial flutter -atypical 2007   ABLATION x2 [facility]   Atrial tachycardia ([place]) 2007   ABLATION [facility]   Benign paroxysmal vertigo    CAD (coronary artery disease)    a. abnl mv 01/2012;  b. cath 05/23/2012 LCX 80@ bifurcation w/ OM, 95% @ AV groove, otw nonobs dzs, EF 55-65%;   Carcinoma ([place])    skin cancer "off my chest"   Familial tremor    First degree AV block    had 4 ablasions that left py with heart block   History of blood transfusion 2010   "2nd day after prostatectomy"   Hyperlipidemia    Hypertension    Incontinence    DECREASING INCONTINENCE RELATED TO PROSTATECTOMY.   Migraines    Presence of permanent cardiac pacemaker 2013 ?   Prostate cancer ([place])    Pulsatile tinnitus    Tachy-brady syndrome ([place])    a. developed after RFCA @ [facility] 2013-> MDT PPM;  b. s/p RA  lead revision 05/2012    Past Surgical History:  Procedure Laterality Date   ATRIAL ABLATION SURGERY  04/01/12   "4th time I had one"   CARDIAC CATHETERIZATION     CARDIOVERSION  12/03/2011   Procedure: CARDIOVERSION;  Surgeon: [name], MD;  Location: [place];  Service: Cardiovascular;  Laterality: [name];   CARDIOVERSION  01/13/2012   Procedure: CARDIOVERSION;  Surgeon: Lelon Perla, MD;  Location: [place];  Service: Cardiovascular;  Laterality: [name];   CARDIOVERSION  04/27/2012   Procedure: CARDIOVERSION;  Surgeon: Darlin Coco, MD;  Location: [place];  Service: Cardiovascular;  Laterality: [name];   COLONOSCOPY     COLONOSCOPY WITH PROPOFOL [name] 10/29/2016   Procedure:  COLONOSCOPY WITH PROPOFOL;  Surgeon: Doran Stabler, MD;  Location: [facility];  Service: Gastroenterology;  Laterality: [name];   CORONARY ANGIOPLASTY WITH STENT PLACEMENT  06/02/12   "1; first one ever"   CORONARY STENT INTERVENTION [name] 07/21/2017   Procedure: CORONARY STENT INTERVENTION;  Surgeon: [name], MD;  Location: [place] CV LAB;  Service: Cardiovascular;  Laterality: [name];   CORONARY STENT INTERVENTION [name] 12/13/2018   Procedure: CORONARY STENT INTERVENTION;  Surgeon: [name], MD;  Location: [place] CV LAB;  Service: Cardiovascular;  Laterality: [name];   EP IMPLANTABLE DEVICE [name] 06/17/2016   Procedure: BiV Pacemaker upgrade;  Surgeon: [name], MD;  Location: [place] CV LAB;  Service: Cardiovascular;  Laterality: [name];   HERNIA REPAIR  1980's   INGUINAL HERNIA REPAIR  1945   left side   INSERT / REPLACE / REMOVE PACEMAKER  03/2012   initial placement   INSERT / REPLACE / REMOVE PACEMAKER  05/25/12   "atrial lead change"   LEAD REVISION [name] 05/25/2012   Procedure: LEAD REVISION;  Surgeon: Evans Lance, MD;  Location: [facility] CATH LAB;  Service: Cardiovascular;  Laterality: [name];   LEFT HEART CATH AND CORONARY ANGIOGRAPHY [name] 07/21/2017   Procedure: LEFT HEART CATH AND CORONARY ANGIOGRAPHY;  Surgeon: [name], MD;  Location: [place] CV LAB;  Service: Cardiovascular;  Laterality: [name];   LEFT HEART CATH AND CORONARY ANGIOGRAPHY [name] 12/13/2018   Procedure: LEFT HEART CATH AND CORONARY ANGIOGRAPHY;  Surgeon: [name], MD;  Location: [place] CV LAB;  Service: Cardiovascular;  Laterality: [name];   PERCUTANEOUS CORONARY STENT INTERVENTION (PCI-S) [name] 06/02/2012   Procedure: PERCUTANEOUS CORONARY STENT INTERVENTION (PCI-S);  Surgeon: Hillary Bow, MD;  Location: [facility] CATH LAB;  Service: Cardiovascular;  Laterality: [name];   PROSTATECTOMY  04/20/4649   COMPLICATED   SHOULDER SURGERY     Bilateral; "boney spurs removed"   SKIN CANCER EXCISION     "in  situ; right chest"   TONSILLECTOMY AND ADENOIDECTOMY     "as a child"    Review of systems negative except as noted in HPI / PMHx or noted below:  Review of Systems  Constitutional: Negative.   HENT: Negative.    Eyes: Negative.   Respiratory: Negative.    Cardiovascular: Negative.   Gastrointestinal: Negative.   Genitourinary: Negative.   Musculoskeletal: Negative.   Skin: Negative.   Neurological: Negative.   Endo/Heme/Allergies: Negative.   Psychiatric/Behavioral: Negative.      Objective:   Vitals:   04/21/22 1149  BP: 118/78  Pulse: 72  Resp: 18  Temp: (!) 97.1 F (36.2 C)  SpO2: 97%   Height: '5\' 11"'$  (180.3 cm)  Weight: 182 lb (82.6 kg)   Physical Exam Constitutional:  Appearance: He is not diaphoretic.  HENT:     Head: Normocephalic.     Right Ear: Tympanic membrane, ear canal and external ear normal.     Left Ear: Tympanic membrane, ear canal and external ear normal.     Nose: Nose normal. No mucosal edema or rhinorrhea.     Mouth/Throat:     Pharynx: Uvula midline. No oropharyngeal exudate.  Eyes:     Conjunctiva/sclera: Conjunctivae normal.  Neck:     Thyroid: No thyromegaly.     Trachea: Trachea normal. No tracheal tenderness or tracheal deviation.  Cardiovascular:     Rate and Rhythm: Normal rate and regular rhythm.     Heart sounds: Normal heart sounds, S1 normal and S2 normal. No murmur heard. Pulmonary:     Effort: No respiratory distress.     Breath sounds: Normal breath sounds. No stridor. No wheezing or rales.  Lymphadenopathy:     Head:     Right side of head: No tonsillar adenopathy.     Left side of head: No tonsillar adenopathy.     Cervical: No cervical adenopathy.  Skin:    Findings: No erythema or rash.     Nails: There is no clubbing.  Neurological:     Mental Status: He is alert.    Diagnostics: none  Assessment and Plan:   1. Other atopic dermatitis     1.  Continue dupilumab injections every 2 weeks  2.  If  needed:   A.  Cetirizine 10 mg - 1 tablet 1-2 times per day  B.  Topical fluocinonide ointment 1-7 times per week  3.  Return to clinic in 1 year or earlier if problem  Keeyon is doing quite well while using dupilumab and we will continue to have him utilize this agent and see him back in this clinic in 1 year or earlier if there is a problem.  I did mention to him today that he may want to try to stretch out the interval of dupilumab administration to every 3 weeks and possibly every 4 weeks and should this not be adequate in controlling his skin condition he can always go back to every 2-week administration.  Allena Katz, MD Allergy / Immunology [employer]

## 2022-04-22 ENCOUNTER — Ambulatory Visit (HOSPITAL_COMMUNITY): Payer: Medicare Other | Attending: Cardiovascular Disease

## 2022-04-22 ENCOUNTER — Encounter: Payer: Self-pay | Admitting: Allergy and Immunology

## 2022-04-22 ENCOUNTER — Ambulatory Visit (INDEPENDENT_AMBULATORY_CARE_PROVIDER_SITE_OTHER): Payer: Medicare Other

## 2022-04-22 DIAGNOSIS — L209 Atopic dermatitis, unspecified: Secondary | ICD-10-CM

## 2022-04-22 DIAGNOSIS — I428 Other cardiomyopathies: Secondary | ICD-10-CM | POA: Diagnosis present

## 2022-04-22 LAB — ECHOCARDIOGRAM COMPLETE
Area-P 1/2: 3.12 cm2
P 1/2 time: 351 msec
S' Lateral: 2.7 cm

## 2022-04-29 NOTE — Progress Notes (Signed)
Remote pacemaker transmission.   

## 2022-05-05 ENCOUNTER — Ambulatory Visit: Payer: Medicare Other | Admitting: Physician Assistant

## 2022-05-12 ENCOUNTER — Ambulatory Visit (INDEPENDENT_AMBULATORY_CARE_PROVIDER_SITE_OTHER): Payer: Medicare Other

## 2022-05-12 DIAGNOSIS — L209 Atopic dermatitis, unspecified: Secondary | ICD-10-CM

## 2022-05-13 ENCOUNTER — Ambulatory Visit: Payer: Medicare Other

## 2022-05-14 ENCOUNTER — Telehealth: Payer: Self-pay

## 2022-05-14 NOTE — Telephone Encounter (Signed)
Received documentation that pt had walked in on 05/05/2022 with a question re: heart pounding and pacemaker settings Attempted phone call to pt and left voicemail message for pt to contact office at [phone].  Will forward message to device clinic to be addressed if pt returns call tomorrow 05/15/2022 as RN will be out of the office.

## 2022-05-15 NOTE — Telephone Encounter (Signed)
Pt returning call from [place], [place]. Informed pt that Rosann Auerbach is out of the office today and told him to send a transmission in from his device for review.

## 2022-05-15 NOTE — Telephone Encounter (Signed)
LMOVM for patient to send a manual transmission. I left the device clinic number in case he needs help.

## 2022-05-18 NOTE — Telephone Encounter (Signed)
Spoke with pt and advised per device RN no episodes noted on 05/05/2022.  Pt verbalizes understanding and plans to keep his 06/01/2022 appointment with Dr Caryl Comes.

## 2022-05-18 NOTE — Telephone Encounter (Signed)
Transmission Received normal device function no episodes noted on day of 05/05/2022  patient is due to see SK, apt scheduled with SK on 06/01/2022

## 2022-05-18 NOTE — Telephone Encounter (Signed)
Transmission received 05-15-22.

## 2022-05-20 ENCOUNTER — Other Ambulatory Visit: Payer: Self-pay | Admitting: Internal Medicine

## 2022-05-20 DIAGNOSIS — R519 Headache, unspecified: Secondary | ICD-10-CM

## 2022-05-27 ENCOUNTER — Ambulatory Visit (INDEPENDENT_AMBULATORY_CARE_PROVIDER_SITE_OTHER): Payer: Medicare Other

## 2022-05-27 DIAGNOSIS — L209 Atopic dermatitis, unspecified: Secondary | ICD-10-CM | POA: Diagnosis not present

## 2022-05-28 ENCOUNTER — Ambulatory Visit: Payer: Medicare Other

## 2022-05-29 DIAGNOSIS — I492 Junctional premature depolarization: Secondary | ICD-10-CM | POA: Insufficient documentation

## 2022-06-01 ENCOUNTER — Encounter: Payer: Self-pay | Admitting: Internal Medicine

## 2022-06-01 ENCOUNTER — Ambulatory Visit (INDEPENDENT_AMBULATORY_CARE_PROVIDER_SITE_OTHER): Payer: Medicare Other | Admitting: Internal Medicine

## 2022-06-01 VITALS — BP 124/78 | HR 81 | Ht 71.0 in | Wt 182.2 lb

## 2022-06-01 DIAGNOSIS — I4892 Unspecified atrial flutter: Secondary | ICD-10-CM | POA: Diagnosis not present

## 2022-06-01 DIAGNOSIS — Z950 Presence of cardiac pacemaker: Secondary | ICD-10-CM | POA: Diagnosis not present

## 2022-06-01 DIAGNOSIS — I25118 Atherosclerotic heart disease of native coronary artery with other forms of angina pectoris: Secondary | ICD-10-CM | POA: Diagnosis not present

## 2022-06-01 DIAGNOSIS — I495 Sick sinus syndrome: Secondary | ICD-10-CM | POA: Diagnosis not present

## 2022-06-01 DIAGNOSIS — I492 Junctional premature depolarization: Secondary | ICD-10-CM

## 2022-06-01 NOTE — Patient Instructions (Addendum)
Medication Instructions:  Your physician recommends that you continue on your current medications as directed. Please refer to the Current Medication list given to you today.  *If you need a refill on your cardiac medications before your next appointment, please call your pharmacy*   Lab Work: CBC and BMET today  If you have labs (blood work) drawn today and your tests are completely normal, you will receive your results only by: [place] (if you have MyChart) OR A paper copy in the mail If you have any lab test that is abnormal or we need to change your treatment, we will call you to review the results.   Testing/Procedures: None ordered.    Follow-Up: At [facility], you and your health needs are our priority.  As part of our continuing mission to provide you with exceptional heart care, we have created designated Provider Care Teams.  These Care Teams include your primary Cardiologist (physician) and Advanced Practice Providers (APPs -  Physician Assistants and Nurse Practitioners) who all work together to provide you with the care you need, when you need it.  We recommend signing up for the patient portal called "MyChart".  Sign up information is provided on this After Visit Summary.  MyChart is used to connect with patients for Virtual Visits (Telemedicine).  Patients are able to view lab/test results, encounter notes, upcoming appointments, etc.  Non-urgent messages can be sent to your provider as well.   To learn more about what you can do with MyChart, go to [email or web address].    Your next appointment:   12 months with Dr Caryl Comes  Important Information About Sugar

## 2022-06-01 NOTE — Progress Notes (Signed)
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 He is s/p [facility] 2 at [facility]  he is status post pacemaker for complete heart block. He  is seen now status post CRT P upgrade  Functional status is been quite stable.  There was a recent visit with Dr[facility] regarding chest discomfort.  Felt not to be anginal.  Also some back discomfort possibly related to Repatha.  A 6-week holiday was undertaken without any change and he has resumed his Repatha.  Intercurrently however, he has suffered the tragic loss over 72-hour period of his wife from an acute abdominal catastrophe.  His son has been very positively involved.   He has a history of coronary artery disease with prior stenting; per TDS  It was elected to use long term aspirin and a NOAC and Rivaroxaban was started; no interval bleeding  In the past angina has been triggered typically by emotional events.  He had recurrent symptoms last summer.  He underwent repeat catheterization with a 95% proximal LAD lesion noted.  Treated DES  Changed BP meds ? 2/2 drug reaction to losartan Has had hx of hyponatremia in past with thiazides and concerns of hypokalemia Rx aldactazide>> his Na>>130 w low grade HA, was going to change to aldactone but blood pressures have remained in the 110-130 range.      > DATE TEST EF   1/14    ECHO   55-60 %   1/17    MYOVIEW    47 % No  ischemia  5/17 Echo  40-45%   4/18 Echo  40-45% Mild LVH  9/18 Cath   60-65% LAD 95%>>DES; Lcx Stent Patent  1/20 Echo  45-50% 15/44m  1/20 LHC 45-50% LADm 95%>>0 stented  6/23 Echo  50-55% ASH mod 16/10 mm   With deteriorating LV function and modest changes in functional status, we undertook CRT  upgrade.   Date Cr K Na Hgb  7/17 1.06   14.4  10/18  1.10 4.8 134 14.2   10/19 1.18 4.5 130    It was elected to keep him on chronic antiplatelet therapy in addition to his Rivaroxaban aspirin was selected---    Past Medical History:  Diagnosis Date   Atrial flutter -atypical 2007   ABLATION x2 [facility]   [place]   [facility] [facility]   Benign paroxysmal vertigo    CAD (coronary artery disease)    a. abnl mv 01/2012;  b. cath 05/23/2012 LCX 80@ bifurcation w/ OM, 95% @ AV groove, otw nonobs dzs, EF 55-65%;   [place]    skin cancer "off my chest"   Familial tremor    First degree AV block    had 4 ablasions that left py with heart block   History of blood transfusion 2010   "2nd day after prostatectomy"   Hyperlipidemia    Hypertension    Incontinence    DECREASING INCONTINENCE RELATED TO PROSTATECTOMY.   Migraines    Presence of permanent cardiac pacemaker 2013 ?   [place]    Pulsatile tinnitus    [place]brady [place]    a. developed after RFCA @ [facility] 2013-> MDT PPM;  b. s/p RA lead revision 05/2012    Past Surgical History:  Procedure Laterality Date   ATRIAL ABLATION SURGERY  04/01/12   "4th time I had one"   CARDIAC CATHETERIZATION     CARDIOVERSION  12/03/2011   Procedure: CARDIOVERSION;  Surgeon: Walter Sprang, MD;  Location: [place];  Service: Cardiovascular;  Laterality: [name];   CARDIOVERSION  01/13/2012   Procedure: CARDIOVERSION;  Surgeon: Walter Perla, MD;  Location: [place];  Service: Cardiovascular;  Laterality: [name];   CARDIOVERSION  04/27/2012   Procedure: CARDIOVERSION;  Surgeon: Walter Coco, MD;  Location: [place];  Service: Cardiovascular;  Laterality: [name];   COLONOSCOPY     COLONOSCOPY WITH PROPOFOL [name] 10/29/2016   Procedure: COLONOSCOPY WITH PROPOFOL;  Surgeon: Walter Stabler, MD;  Location: [facility];  Service: Gastroenterology;  Laterality: [name];   CORONARY ANGIOPLASTY WITH STENT PLACEMENT  06/02/12    "1; first one ever"   CORONARY STENT INTERVENTION [name] 07/21/2017   Procedure: CORONARY STENT INTERVENTION;  Surgeon: [name], MD;  Location: [place] CV LAB;  Service: Cardiovascular;  Laterality: [name];   CORONARY STENT INTERVENTION [name] 12/13/2018   Procedure: CORONARY STENT INTERVENTION;  Surgeon: [name], MD;  Location: [place] CV LAB;  Service: Cardiovascular;  Laterality: [name];   EP IMPLANTABLE DEVICE [name] 06/17/2016   Procedure: BiV Pacemaker upgrade;  Surgeon: Walter Sprang, MD;  Location: [place] CV LAB;  Service: Cardiovascular;  Laterality: [name];   HERNIA REPAIR  1980's   INGUINAL HERNIA REPAIR  1945   left side   INSERT / REPLACE / REMOVE PACEMAKER  03/2012   initial placement   INSERT / REPLACE / REMOVE PACEMAKER  05/25/12   "atrial lead change"   LEAD REVISION [name] 05/25/2012   Procedure: LEAD REVISION;  Surgeon: Evans Lance, MD;  Location: [facility] CATH LAB;  Service: Cardiovascular;  Laterality: [name];   LEFT HEART CATH AND CORONARY ANGIOGRAPHY [name] 07/21/2017   Procedure: LEFT HEART CATH AND CORONARY ANGIOGRAPHY;  Surgeon: [name], MD;  Location: [place] CV LAB;  Service: Cardiovascular;  Laterality: [name];   LEFT HEART CATH AND CORONARY ANGIOGRAPHY [name] 12/13/2018   Procedure: LEFT HEART CATH AND CORONARY ANGIOGRAPHY;  Surgeon: [name], MD;  Location: [place] CV LAB;  Service: Cardiovascular;  Laterality: [name];   PERCUTANEOUS CORONARY STENT INTERVENTION (PCI-S) [name] 06/02/2012   Procedure: PERCUTANEOUS CORONARY STENT INTERVENTION (PCI-S);  Surgeon: Walter Bow, MD;  Location: [facility] CATH LAB;  Service: Cardiovascular;  Laterality: [name];   PROSTATECTOMY  01/18/7424   COMPLICATED   SHOULDER SURGERY     Bilateral; "boney spurs removed"   SKIN CANCER EXCISION     "in situ; right chest"   TONSILLECTOMY AND ADENOIDECTOMY     "as a child"    Current Outpatient Medications  Medication Sig Dispense Refill   acyclovir (ZOVIRAX) 400 MG tablet Take 400 mg  by mouth as needed.     apixaban (ELIQUIS) 5 MG TABS tablet Take 1 tablet (5 mg total) by mouth 2 (two) times daily. 60 tablet 3   aspirin EC 81 MG tablet Take 1 tablet (81 mg total) by mouth daily. Swallow whole.     cetirizine (ZYRTEC) 10 MG tablet Take 1 tablet (10 mg total) by mouth 2 (two) times daily. 60 tablet 11   Cholecalciferol 25 MCG (1000 UT) tablet TAKE ONE TABLET BY MOUTH DAILY FOR LOW VITAMIN D     dupilumab (DUPIXENT) 300 MG/2ML prefilled syringe Inject 1  pen/syringe subcutaneously every other week 4 mL 11   EPINEPHrine 0.3 mg/0.3 mL IJ SOAJ injection Inject 0.3 mg into the muscle as needed. 1 each 1   fluocinonide ointment (LIDEX) 1.28 % Apply 1 application. topically 2 (two) times daily. (Patient taking differently: Apply 1 application  topically 2 (two) times daily as needed.) 60 g 11   fluorouracil (EFUDEX) 5 % cream 1 application Externally Once a day for 21 days     losartan (COZAAR) 25 MG tablet Take 25 mg by mouth daily.     metroNIDAZOLE (METROGEL) 0.75 % gel Apply 1 application topically as needed.     nitroGLYCERIN (NITROSTAT) 0.4 MG SL tablet Place 0.4 mg under the tongue every 5 (five) minutes as needed for chest pain (x 3 tabs).     propranolol (INDERAL) 10 MG tablet 1 tablet     REPATHA SURECLICK 786 MG/ML SOAJ INJECT 1 PEN INTO THE SKIN EVERY 14 DAYS 2 mL 11   Current Facility-Administered Medications  Medication Dose Route Frequency Provider Last Rate Last Admin   dupilumab (DUPIXENT) prefilled syringe 300 mg  300 mg Subcutaneous Q14 Days Kozlow, Donnamarie Poag, MD   300 mg at 05/27/22 0957    Allergies  Allergen Reactions   Ceclor [Cefaclor] Hives   Chlorhexidine Itching and Rash   Ace Inhibitors Other (See Comments)    Does not take due to "angiotensin is responsible for maintaining bladder sphincter tone"   Amlodipine     Other reaction(s): lethargy   Xarelto [Rivaroxaban]     Review of Systems negative except from HPI and PMH  Physical Exam  BP 124/78    Pulse 81   Ht '5\' 11"'$  (1.803 m)   Wt 182 lb 3.2 oz (82.6 kg)   SpO2 97%   BMI 25.41 kg/m  Well developed and well nourished in no acute distress HENT normal Neck supple with JVP-flat Clear Device pocket well healed; without hematoma or erythema.  There is no tethering  Regular rate and rhythm, no *** gallop No ***/*** murmur Abd-soft with active BS No Clubbing cyanosis *** edema Skin-warm and dry A & Oriented  Grossly normal sensory and motor function  ECG AV pacing 'upright QRS v1 And neg Lead 1    Assessment and  Plan  Atrial flutter/fib  Atrial fibrillation s/p ablation [facility] x2  Premature JUNCTIONAL BEATS/Atrial beatsPVCs  Sinus node dysfunction  Cardiomyopathy-mild-resolved  Pacemaker CRT - [employer]   Coronary artery disease s/p  DES stenting 2013/2020>>long term dual therapy recommended per Dr Lilly Cove    Hypertension  Grief     Blood pressure is well controlled.  Continue on losartan 25.  Cardiomyopathy is modestly worsened.  We will continue on his Cozaar 25; also has more ectopy on his electrocardiogram but review of his device suggests that the burden is actually quite small.  He has been on Entresto a couple of times before and has not tolerated.  There may be a role for spironolactone there have been multiple discussions in the past regarding beta-blockers.  I will reach out to Dr. [facility] and chat with him regarding drug adjustments.  It may also be prudent prior to this to repeat an echocardiogram as there may have been some hibernating myocardium at the time because of the high-grade LAD lesion

## 2022-06-02 ENCOUNTER — Telehealth: Payer: Self-pay

## 2022-06-02 ENCOUNTER — Ambulatory Visit (INDEPENDENT_AMBULATORY_CARE_PROVIDER_SITE_OTHER): Payer: Medicare Other | Admitting: Internal Medicine

## 2022-06-02 VITALS — BP 139/88 | HR 78 | Wt 182.0 lb

## 2022-06-02 DIAGNOSIS — I25118 Atherosclerotic heart disease of native coronary artery with other forms of angina pectoris: Secondary | ICD-10-CM

## 2022-06-02 DIAGNOSIS — Z950 Presence of cardiac pacemaker: Secondary | ICD-10-CM

## 2022-06-02 DIAGNOSIS — I4729 Other ventricular tachycardia: Secondary | ICD-10-CM

## 2022-06-02 LAB — CBC
Hematocrit: 39 % (ref 37.5–51.0)
Hemoglobin: 13.2 g/dL (ref 13.0–17.7)
MCH: 30.9 pg (ref 26.6–33.0)
MCHC: 33.8 g/dL (ref 31.5–35.7)
MCV: 91 fL (ref 79–97)
Platelets: 169 10*3/uL (ref 150–450)
RBC: 4.27 x10E6/uL (ref 4.14–5.80)
RDW: 12.2 % (ref 11.6–15.4)
WBC: 5.2 10*3/uL (ref 3.4–10.8)

## 2022-06-02 LAB — BASIC METABOLIC PANEL
BUN/Creatinine Ratio: 17 (ref 10–24)
BUN: 17 mg/dL (ref 8–27)
CO2: 25 mmol/L (ref 20–29)
Calcium: 9.3 mg/dL (ref 8.6–10.2)
Chloride: 99 mmol/L (ref 96–106)
Creatinine, Ser: 1 mg/dL (ref 0.76–1.27)
Glucose: 90 mg/dL (ref 70–99)
Potassium: 4.9 mmol/L (ref 3.5–5.2)
Sodium: 135 mmol/L (ref 134–144)
eGFR: 77 mL/min/{1.73_m2} (ref >59)

## 2022-06-02 NOTE — Telephone Encounter (Signed)
  Apt made on Dr. Aquilla Hacker scheudle for 06/02/22 @ 2:00 pm. Requested assistance with MDT, spoke to [facility].

## 2022-06-02 NOTE — Telephone Encounter (Signed)
Spoke with pt who reports he was seen in the office yesterday by Dr Caryl Comes and device changes were made.  Pt states he woke at 330am and was tachy for about one minute with HR 200+.  Afterwards he has had some additional arrhythmia with HR in the 80's and "feeling weird."  He denies current CP, SOB or dizziness.  Pt states he sent a device transmission around 4am this morning.  Pt advised will have device RN and Dr Caryl Comes review for further recommendation.  Pt verbalizes understanding and requests that we contact him on his cell phone number.

## 2022-06-02 NOTE — Telephone Encounter (Signed)
Routing to Dr. Caryl Comes for review. No episodes noted on device. Please advise.

## 2022-06-02 NOTE — Progress Notes (Signed)
Seen today following reprogramming with two observations 1) HR went about 200 last night 2) HR feels likes its too fast   The histogram >> marked increase in atrial pacing at his ADL rate; this was addressed by reprogramming his threshold from low--medium low.  Ambulation associated with a heart rate in the low to mid 80s.  No tachycardia was noted; however, there was only a ventricular sensed events been from June a ventricular tachycardia that was nonsustained at about 200 bpm substantiating the observation and it may have been that the for short recurrent nonsustained runs as 10 beats would have been detected and stored.  We will follow-up  BP 139/88   Pulse 78   Wt 182 lb (82.6 kg)   BMI 25.38 kg/m    Well developed and nourished in no acute distress HENT normal Neck supple   Clear Regular rate and rhythm, no murmurs or gallops Abd-soft with active BS No Clubbing cyanosis edema Skin-warm and dry A & Oriented  Grossly normal sensory and motor function

## 2022-06-02 NOTE — Patient Instructions (Signed)
Medication Instructions:  Your physician recommends that you continue on your current medications as directed. Please refer to the Current Medication list given to you today.  *If you need a refill on your cardiac medications before your next appointment, please call your pharmacy*   Lab Work: None ordered.  If you have labs (blood work) drawn today and your tests are completely normal, you will receive your results only by: [place] (if you have MyChart) OR A paper copy in the mail If you have any lab test that is abnormal or we need to change your treatment, we will call you to review the results.   Testing/Procedures: None ordered.    Follow-Up: At [facility], you and your health needs are our priority.  As part of our continuing mission to provide you with exceptional heart care, we have created designated Provider Care Teams.  These Care Teams include your primary Cardiologist (physician) and Advanced Practice Providers (APPs -  Physician Assistants and Nurse Practitioners) who all work together to provide you with the care you need, when you need it.  We recommend signing up for the patient portal called "MyChart".  Sign up information is provided on this After Visit Summary.  MyChart is used to connect with patients for Virtual Visits (Telemedicine).  Patients are able to view lab/test results, encounter notes, upcoming appointments, etc.  Non-urgent messages can be sent to your provider as well.   To learn more about what you can do with MyChart, go to [email or web address].    Your next appointment:   July 2024  Important Information About Sugar

## 2022-06-02 NOTE — Telephone Encounter (Signed)
Overzealous pacing >> lets decrease his sensitivity from Low>>med-low and walk him around

## 2022-06-02 NOTE — Telephone Encounter (Signed)
[name], I tried to call him, no answer, I think however, that we need to reprogram his pacemaker, and left a message for [name] on this

## 2022-06-03 LAB — CUP PACEART INCLINIC DEVICE CHECK
Date Time Interrogation Session: 20230717202635
Implantable Lead Implant Date: 20130517
Implantable Lead Implant Date: 20130710
Implantable Lead Implant Date: 20170802
Implantable Lead Location: 753858
Implantable Lead Location: 753859
Implantable Lead Location: 753860
Implantable Pulse Generator Implant Date: 20170802

## 2022-06-11 ENCOUNTER — Ambulatory Visit (INDEPENDENT_AMBULATORY_CARE_PROVIDER_SITE_OTHER): Payer: Medicare Other

## 2022-06-11 DIAGNOSIS — L209 Atopic dermatitis, unspecified: Secondary | ICD-10-CM

## 2022-06-18 ENCOUNTER — Ambulatory Visit
Admission: RE | Admit: 2022-06-18 | Discharge: 2022-06-18 | Disposition: A | Discharge status: Home / Self Care | Payer: Medicare Other | Source: Ambulatory Visit | Attending: Internal Medicine | Admitting: Internal Medicine

## 2022-06-18 DIAGNOSIS — R519 Headache, unspecified: Secondary | ICD-10-CM

## 2022-06-18 MED ORDER — IOPAMIDOL (ISOVUE-300) INJECTION 61%
75.0000 mL | Freq: Once | INTRAVENOUS | Status: AC | PRN
  Administered 2022-06-18: 75 mL via INTRAVENOUS

## 2022-07-01 ENCOUNTER — Ambulatory Visit (INDEPENDENT_AMBULATORY_CARE_PROVIDER_SITE_OTHER): Payer: Medicare Other

## 2022-07-01 DIAGNOSIS — L209 Atopic dermatitis, unspecified: Secondary | ICD-10-CM | POA: Diagnosis not present

## 2022-07-08 ENCOUNTER — Other Ambulatory Visit: Payer: Self-pay | Admitting: Cardiovascular Disease

## 2022-07-15 ENCOUNTER — Ambulatory Visit (INDEPENDENT_AMBULATORY_CARE_PROVIDER_SITE_OTHER): Payer: Medicare Other

## 2022-07-15 DIAGNOSIS — I495 Sick sinus syndrome: Secondary | ICD-10-CM

## 2022-07-19 LAB — CUP PACEART REMOTE DEVICE CHECK
Battery Remaining Longevity: 33 mo
Battery Voltage: 2.91 V
Brady Statistic AP VP Percent: 54.08 %
Brady Statistic AP VS Percent: 0.09 %
Brady Statistic AS VP Percent: 44.9 %
Brady Statistic AS VS Percent: 0.79 %
Brady Statistic RA Percent Paced: 2.1 %
Brady Statistic RV Percent Paced: 99.13 %
Date Time Interrogation Session: 20230830000125
Implantable Lead Implant Date: 20130517
Implantable Lead Implant Date: 20130710
Implantable Lead Implant Date: 20170802
Implantable Lead Location: 753858
Implantable Lead Location: 753859
Implantable Lead Location: 753860
Implantable Pulse Generator Implant Date: 20170802
Lead Channel Impedance Value: 1026 Ohm
Lead Channel Impedance Value: 1083 Ohm
Lead Channel Impedance Value: 1102 Ohm
Lead Channel Impedance Value: 342 Ohm
Lead Channel Impedance Value: 361 Ohm
Lead Channel Impedance Value: 437 Ohm
Lead Channel Impedance Value: 437 Ohm
Lead Channel Impedance Value: 456 Ohm
Lead Channel Impedance Value: 532 Ohm
Lead Channel Impedance Value: 551 Ohm
Lead Channel Impedance Value: 665 Ohm
Lead Channel Impedance Value: 836 Ohm
Lead Channel Impedance Value: 893 Ohm
Lead Channel Impedance Value: 931 Ohm
Lead Channel Pacing Threshold Amplitude: 0.625 V
Lead Channel Pacing Threshold Amplitude: 1 V
Lead Channel Pacing Threshold Amplitude: 1.375 V
Lead Channel Pacing Threshold Pulse Width: 0.4 ms
Lead Channel Pacing Threshold Pulse Width: 0.4 ms
Lead Channel Pacing Threshold Pulse Width: 0.8 ms
Lead Channel Sensing Intrinsic Amplitude: 0.875 mV
Lead Channel Sensing Intrinsic Amplitude: 0.875 mV
Lead Channel Sensing Intrinsic Amplitude: 14.25 mV
Lead Channel Sensing Intrinsic Amplitude: 14.25 mV
Lead Channel Setting Pacing Amplitude: 2 V
Lead Channel Setting Pacing Amplitude: 2 V
Lead Channel Setting Pacing Amplitude: 2.5 V
Lead Channel Setting Pacing Pulse Width: 0.4 ms
Lead Channel Setting Pacing Pulse Width: 1 ms
Lead Channel Setting Sensing Sensitivity: 4 mV

## 2022-07-21 ENCOUNTER — Telehealth: Payer: Self-pay | Admitting: *Deleted

## 2022-07-21 ENCOUNTER — Ambulatory Visit (INDEPENDENT_AMBULATORY_CARE_PROVIDER_SITE_OTHER): Payer: Medicare Other | Admitting: *Deleted

## 2022-07-21 DIAGNOSIS — L209 Atopic dermatitis, unspecified: Secondary | ICD-10-CM

## 2022-07-21 NOTE — Telephone Encounter (Signed)
Patient came in for his Dupixent injection and stated that he has Vitiligo on his forearms that has occurred within the last 6 months. He read that it could be caused by the Ranger, he wanted you to be aware and receive your opinion. With patients permission I have taken pictures of his arms and attached them in this encounter.

## 2022-07-24 NOTE — Telephone Encounter (Signed)
Called and spoke with the patient and informed. Patient verbalized understanding. He still sees the Dermatologist and he is going to call and reach out to them to receive advice on treating the Vitiligo.

## 2022-07-29 ENCOUNTER — Ambulatory Visit: Payer: Medicare Other

## 2022-07-31 ENCOUNTER — Ambulatory Visit: Payer: Medicare Other

## 2022-08-04 ENCOUNTER — Ambulatory Visit: Payer: Medicare Other

## 2022-08-04 ENCOUNTER — Encounter: Payer: Self-pay | Admitting: Allergy and Immunology

## 2022-08-05 ENCOUNTER — Ambulatory Visit (INDEPENDENT_AMBULATORY_CARE_PROVIDER_SITE_OTHER): Payer: Medicare Other | Admitting: *Deleted

## 2022-08-05 DIAGNOSIS — L209 Atopic dermatitis, unspecified: Secondary | ICD-10-CM

## 2022-08-07 NOTE — Progress Notes (Signed)
Remote pacemaker transmission.   

## 2022-08-19 ENCOUNTER — Ambulatory Visit (INDEPENDENT_AMBULATORY_CARE_PROVIDER_SITE_OTHER): Payer: Medicare Other

## 2022-08-19 DIAGNOSIS — L209 Atopic dermatitis, unspecified: Secondary | ICD-10-CM | POA: Diagnosis not present

## 2022-09-09 ENCOUNTER — Ambulatory Visit (INDEPENDENT_AMBULATORY_CARE_PROVIDER_SITE_OTHER): Payer: Medicare Other | Admitting: *Deleted

## 2022-09-09 DIAGNOSIS — L209 Atopic dermatitis, unspecified: Secondary | ICD-10-CM | POA: Diagnosis not present

## 2022-09-30 ENCOUNTER — Ambulatory Visit (INDEPENDENT_AMBULATORY_CARE_PROVIDER_SITE_OTHER): Payer: Medicare Other | Admitting: *Deleted

## 2022-09-30 DIAGNOSIS — L209 Atopic dermatitis, unspecified: Secondary | ICD-10-CM | POA: Diagnosis not present

## 2022-10-14 ENCOUNTER — Ambulatory Visit (INDEPENDENT_AMBULATORY_CARE_PROVIDER_SITE_OTHER): Payer: Medicare Other

## 2022-10-14 DIAGNOSIS — I442 Atrioventricular block, complete: Secondary | ICD-10-CM

## 2022-10-14 LAB — CUP PACEART REMOTE DEVICE CHECK
Battery Remaining Longevity: 30 mo
Battery Voltage: 2.9 V
Brady Statistic AP VP Percent: 52.45 %
Brady Statistic AP VS Percent: 0.04 %
Brady Statistic AS VP Percent: 46.46 %
Brady Statistic AS VS Percent: 1 %
Brady Statistic RA Percent Paced: 43.43 %
Brady Statistic RV Percent Paced: 98.94 %
Date Time Interrogation Session: 20231128225442
Implantable Lead Connection Status: 753985
Implantable Lead Connection Status: 753985
Implantable Lead Connection Status: 753985
Implantable Lead Implant Date: 20130517
Implantable Lead Implant Date: 20130710
Implantable Lead Implant Date: 20170802
Implantable Lead Location: 753858
Implantable Lead Location: 753859
Implantable Lead Location: 753860
Implantable Pulse Generator Implant Date: 20170802
Lead Channel Impedance Value: 1007 Ohm
Lead Channel Impedance Value: 1083 Ohm
Lead Channel Impedance Value: 1140 Ohm
Lead Channel Impedance Value: 323 Ohm
Lead Channel Impedance Value: 342 Ohm
Lead Channel Impedance Value: 418 Ohm
Lead Channel Impedance Value: 437 Ohm
Lead Channel Impedance Value: 456 Ohm
Lead Channel Impedance Value: 494 Ohm
Lead Channel Impedance Value: 570 Ohm
Lead Channel Impedance Value: 665 Ohm
Lead Channel Impedance Value: 798 Ohm
Lead Channel Impedance Value: 893 Ohm
Lead Channel Impedance Value: 931 Ohm
Lead Channel Pacing Threshold Amplitude: 0.75 V
Lead Channel Pacing Threshold Amplitude: 1.125 V
Lead Channel Pacing Threshold Amplitude: 1.375 V
Lead Channel Pacing Threshold Pulse Width: 0.4 ms
Lead Channel Pacing Threshold Pulse Width: 0.4 ms
Lead Channel Pacing Threshold Pulse Width: 0.8 ms
Lead Channel Sensing Intrinsic Amplitude: 1.125 mV
Lead Channel Sensing Intrinsic Amplitude: 1.125 mV
Lead Channel Sensing Intrinsic Amplitude: 14.25 mV
Lead Channel Sensing Intrinsic Amplitude: 14.25 mV
Lead Channel Setting Pacing Amplitude: 2 V
Lead Channel Setting Pacing Amplitude: 2.25 V
Lead Channel Setting Pacing Amplitude: 2.5 V
Lead Channel Setting Pacing Pulse Width: 0.4 ms
Lead Channel Setting Pacing Pulse Width: 1 ms
Lead Channel Setting Sensing Sensitivity: 4 mV
Zone Setting Status: 755011
Zone Setting Status: 755011

## 2022-10-16 ENCOUNTER — Ambulatory Visit (INDEPENDENT_AMBULATORY_CARE_PROVIDER_SITE_OTHER): Payer: Medicare Other

## 2022-10-16 ENCOUNTER — Encounter: Payer: Self-pay | Admitting: Cardiovascular Disease

## 2022-10-16 ENCOUNTER — Ambulatory Visit: Payer: Medicare Other | Attending: Cardiovascular Disease | Admitting: Cardiovascular Disease

## 2022-10-16 VITALS — BP 116/74 | HR 73 | Ht 72.0 in | Wt 181.0 lb

## 2022-10-16 DIAGNOSIS — E782 Mixed hyperlipidemia: Secondary | ICD-10-CM | POA: Diagnosis not present

## 2022-10-16 DIAGNOSIS — I495 Sick sinus syndrome: Secondary | ICD-10-CM

## 2022-10-16 DIAGNOSIS — I25118 Atherosclerotic heart disease of native coronary artery with other forms of angina pectoris: Secondary | ICD-10-CM | POA: Insufficient documentation

## 2022-10-16 DIAGNOSIS — Z950 Presence of cardiac pacemaker: Secondary | ICD-10-CM

## 2022-10-16 DIAGNOSIS — I442 Atrioventricular block, complete: Secondary | ICD-10-CM

## 2022-10-16 DIAGNOSIS — I4819 Other persistent atrial fibrillation: Secondary | ICD-10-CM

## 2022-10-16 LAB — LIPID PANEL
Chol/HDL Ratio: 1.9 ratio (ref 0.0–5.0)
Cholesterol, Total: 134 mg/dL (ref 100–199)
HDL: 69 mg/dL (ref >39)
LDL Chol Calc (NIH): 55 mg/dL (ref 0–99)
Triglycerides: 40 mg/dL (ref 0–149)
VLDL Cholesterol Cal: 10 mg/dL (ref 5–40)

## 2022-10-16 LAB — HEPATIC FUNCTION PANEL
ALT: 20 IU/L (ref 0–44)
AST: 23 IU/L (ref 0–40)
Albumin: 4.3 g/dL (ref 3.8–4.8)
Alkaline Phosphatase: 111 IU/L (ref 44–121)
Bilirubin Total: 1.5 mg/dL — ABNORMAL HIGH (ref 0.0–1.2)
Bilirubin, Direct: 0.43 mg/dL — ABNORMAL HIGH (ref 0.00–0.40)
Total Protein: 6 g/dL (ref 6.0–8.5)

## 2022-10-16 NOTE — Patient Instructions (Signed)
Medication Instructions:  Your physician recommends that you continue on your current medications as directed. Please refer to the Current Medication list given to you today.  *If you need a refill on your cardiac medications before your next appointment, please call your pharmacy*   Lab Work: Lipids, Liver today If you have labs (blood work) drawn today and your tests are completely normal, you will receive your results only by: [place] (if you have MyChart) OR A paper copy in the mail If you have any lab test that is abnormal or we need to change your treatment, we will call you to review the results.   Testing/Procedures: NONE   Follow-Up: At [facility], you and your health needs are our priority.  As part of our continuing mission to provide you with exceptional heart care, we have created designated Provider Care Teams.  These Care Teams include your primary Cardiologist (physician) and Advanced Practice Providers (APPs -  Physician Assistants and Nurse Practitioners) who all work together to provide you with the care you need, when you need it.  We recommend signing up for the patient portal called "MyChart".  Sign up information is provided on this After Visit Summary.  MyChart is used to connect with patients for Virtual Visits (Telemedicine).  Patients are able to view lab/test results, encounter notes, upcoming appointments, etc.  Non-urgent messages can be sent to your provider as well.   To learn more about what you can do with MyChart, go to [email or web address].    Your next appointment:   04/19/23 @ 8:20am  The format for your next appointment:   In Person  Provider:   [name], MD       Important Information About Sugar

## 2022-10-16 NOTE — Progress Notes (Signed)
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  The patient has been followed by Dr. Caryl Comes for atrial fibrillation and he has a history of A. fib ablation.  He has undergone stenting of the LAD and left circumflex in the past.  He was found to have a new lesion at the time of his most recent heart catheterization in January 2020.  He again underwent PCI using a drug-eluting stent.  He has been maintained on a combination of apixaban for atrial fibrillation and aspirin for coronary artery disease.    The patient is here alone today.  He describes exertional chest discomfort that radiates into the left arm during his walk when he has to go up about an 8% grade.  This has been stable in pattern.  He is able to continue walking with resolution of his symptoms.  He does not have to slow down or stop to rest.  He also experiences angina on occasion with emotional stress.  No angina or symptoms of chest discomfort with any of his routine activities.  He denies shortness of breath, orthopnea, edema, or PND.  No lightheadedness or syncope.  Past Medical History:  Diagnosis Date   Atrial flutter -atypical 2007   ABLATION x2 [facility]   Atrial tachycardia 2007   ABLATION [facility]   Benign paroxysmal vertigo    CAD (coronary artery disease)    a. abnl mv 01/2012;  b. cath 05/23/2012 LCX 80@ bifurcation w/ OM, 95% @ AV groove, otw nonobs dzs, EF 55-65%;   Carcinoma ([place])    skin cancer "off my chest"   Familial tremor    First degree AV block    had 4 ablasions that left py with  heart block   History of blood transfusion 2010   "2nd day after prostatectomy"   Hyperlipidemia    Hypertension    Incontinence    DECREASING INCONTINENCE RELATED TO PROSTATECTOMY.   Migraines    Presence of permanent cardiac pacemaker 2013 ?   Prostate cancer ([place])    Pulsatile tinnitus    Tachy-brady syndrome ([place])    a. developed after RFCA @ [facility] 2013-> MDT PPM;  b. s/p RA lead revision 05/2012    Past Surgical History:  Procedure Laterality Date   ATRIAL ABLATION SURGERY  04/01/12   "4th time I had one"   CARDIAC CATHETERIZATION     CARDIOVERSION  12/03/2011   Procedure: CARDIOVERSION;  Surgeon: [name], MD;  Location: [place];  Service: Cardiovascular;  Laterality: [name];   CARDIOVERSION  01/13/2012   Procedure: CARDIOVERSION;  Surgeon: Lelon Perla, MD;  Location: [place];  Service: Cardiovascular;  Laterality: [name];   CARDIOVERSION  04/27/2012   Procedure: CARDIOVERSION;  Surgeon: Darlin Coco, MD;  Location: [place];  Service: Cardiovascular;  Laterality: [name];   COLONOSCOPY     COLONOSCOPY WITH PROPOFOL [name] 10/29/2016   Procedure: COLONOSCOPY WITH PROPOFOL;  Surgeon: Doran Stabler, MD;  Location: [facility];  Service: Gastroenterology;  Laterality: [name];   CORONARY ANGIOPLASTY WITH STENT PLACEMENT  06/02/12   "1; first one ever"   CORONARY STENT INTERVENTION [name] 07/21/2017   Procedure: CORONARY STENT INTERVENTION;  Surgeon: [name], MD;  Location: [place] [facility];  Service: Cardiovascular;  Laterality: [name];   CORONARY STENT INTERVENTION [name] 12/13/2018   Procedure: CORONARY STENT INTERVENTION;  Surgeon: [name], MD;  Location: [place] [facility];  Service: Cardiovascular;  Laterality: [name];   EP IMPLANTABLE DEVICE [name] 06/17/2016   Procedure: BiV Pacemaker upgrade;  Surgeon: [name], MD;  Location: [place] [facility];  Service: Cardiovascular;  Laterality: [name];   HERNIA REPAIR  1980's   INGUINAL HERNIA REPAIR  1945   left side   INSERT /  REPLACE / REMOVE PACEMAKER  03/2012   initial placement   INSERT / REPLACE / REMOVE PACEMAKER  05/25/12   "atrial lead change"   LEAD REVISION [name] 05/25/2012   Procedure: LEAD REVISION;  Surgeon: Evans Lance, MD;  [facility];  Service: Cardiovascular;  Laterality: [name];   LEFT HEART CATH AND CORONARY ANGIOGRAPHY [name] 07/21/2017   Procedure: LEFT HEART CATH AND CORONARY ANGIOGRAPHY;  Surgeon: [name], MD;  Location: [place] CV [facility];  Service: Cardiovascular;  Laterality: [name];   LEFT HEART CATH AND CORONARY ANGIOGRAPHY [name] 12/13/2018   Procedure: LEFT HEART CATH AND CORONARY ANGIOGRAPHY;  Surgeon: [name], MD;  Location: [place] CV [facility];  Service: Cardiovascular;  Laterality: [name];   PERCUTANEOUS CORONARY STENT INTERVENTION (PCI-S) [name] 06/02/2012   Procedure: PERCUTANEOUS CORONARY STENT INTERVENTION (PCI-S);  Surgeon: Hillary Bow, MD;  Location: [facility];  Service: Cardiovascular;  Laterality: [name];   PROSTATECTOMY  03/17/7781   COMPLICATED   SHOULDER SURGERY     Bilateral; "boney spurs removed"   SKIN CANCER EXCISION     "in situ; right chest"   TONSILLECTOMY AND ADENOIDECTOMY     "as a child"    Current Medications: Current Meds  Medication Sig   acyclovir (ZOVIRAX) 400 MG tablet Take 400 mg by mouth as needed.   apixaban (ELIQUIS) 5 MG TABS tablet Take 1 tablet (5 mg total) by mouth 2 (two) times daily.   aspirin EC 81 MG tablet Take 1 tablet (81 mg total) by mouth daily. Swallow whole.   cetirizine (ZYRTEC) 10 MG tablet Take 1 tablet (10 mg total) by mouth 2 (two) times daily.   Cholecalciferol 25 MCG (1000 UT) tablet TAKE ONE TABLET BY MOUTH DAILY FOR LOW VITAMIN D   dupilumab (DUPIXENT) 300 MG/2ML prefilled syringe Inject 1 pen/syringe subcutaneously every other week   EPINEPHrine 0.3 mg/0.3 mL IJ SOAJ injection Inject 0.3 mg into the muscle as needed.   fluocinonide ointment (LIDEX) 4.23 % Apply 1 application. topically 2 (two) times daily. (Patient  taking differently: Apply 1 application  topically 2 (two) times daily as needed.)   losartan (COZAAR) 25 MG tablet Take 25 mg by mouth daily.   metroNIDAZOLE (METROGEL) 0.75 % gel Apply 1 application topically as needed.   nitroGLYCERIN (NITROSTAT) 0.4 MG SL tablet Place 0.4 mg under the tongue every 5 (five) minutes as needed for chest pain (x 3 tabs).   propranolol (INDERAL) 10 MG tablet 1 tablet   REPATHA SURECLICK 536 MG/ML SOAJ INJECT 1 PEN UNDER THE SKIN EVERY 14 DAYS   Ruxolitinib Phosphate (OPZELURA) 1.5 % CREA 1 application Externally Twice a day   [DISCONTINUED] fluorouracil (EFUDEX) 5 % cream 1 application Externally Once a day  for 21 days   Current Facility-Administered Medications for the 10/16/22 encounter (Office Visit) with [name], MD  Medication   dupilumab (DUPIXENT) prefilled syringe 300 mg     Allergies:   Ceclor [cefaclor], Chlorhexidine, Ace inhibitors, Amlodipine, and Xarelto [rivaroxaban]   Social History   Socioeconomic History   Marital status: Married    Spouse name: Not on file   Number of children: Not on file   Years of education: Not on file   Highest education level: Not on file  Occupational History   Occupation: Physician    Comment: Retired - ENT  Tobacco Use   Smoking status: Never   Smokeless tobacco: Never  Vaping Use   Vaping Use: Never used  Substance and Sexual Activity   Alcohol use: Yes    Alcohol/week: 3.0 standard drinks of alcohol    Types: 3 Standard drinks or equivalent per week    Comment: drinks alcohol at least once every 3 months 05/25/12 "last alcohol 02/2012"   Drug use: No   Sexual activity: Not Currently  Other Topics Concern   Not on file  Social History Narrative   Retired ENT   Social Determinants of Health   Financial Resource Strain: Not on file  Food Insecurity: Not on file  Transportation Needs: Not on file  Physical Activity: Not on file  Stress: Not on file  Social Connections: Not on file      Family History: The patient's family history includes Colon cancer in his sister; Diabetes in his brother; Heart disease in his brother and father. There is no history of Colon polyps, Rectal cancer, Stomach cancer, or Esophageal cancer.  ROS:   Please see the history of present illness.    All other systems reviewed and are negative.  EKGs/Labs/Other Studies Reviewed:    The following studies were reviewed today: Cardiac catheterization from 2020 was reviewed: 1.  Severe mid LAD stenosis treated successfully with PCI using a 2.5 x 26 mm resolute Onyx DES, postdilated to high pressure with a 3.0 mm noncompliant balloon 2.  Continued patency of the stented segment in the proximal LAD with no significant restenosis 3.  Continued patency of the stented segment in the left circumflex with chronic occlusion of the circumflex subbranch collateralized by left to left collaterals 4.  Moderate stenosis of the distal RCA, unchanged from previous studies, estimated at 50% 5.  Mild global LV systolic dysfunction with LVEF estimated at 50 to 55%   Recommendations: Resume apixaban tomorrow, continue aspirin 81 mg daily x1 month, continue clopidogrel 75 mg daily x6 months.  The patient has had multiple ACS events.  If he is tolerating a combination of apixaban and clopidogrel without significant bleeding problems, it might be reasonable to continue longer than 6 months.  Echo 04/22/22: 1. Left ventricular ejection fraction, by estimation, is 50 to 55%. The  left ventricle has low normal function. The left ventricle has no regional  wall motion abnormalities. There is moderate asymmetric left ventricular  hypertrophy of the septal  segment. Left ventricular diastolic function could not be evaluated.   2. Right ventricular systolic function is normal. The right ventricular  size is mildly enlarged. There is normal pulmonary artery systolic  pressure. The estimated right ventricular systolic pressure is  25.8 mmHg.   3. Right atrial size was mild to moderately dilated.   4. The mitral valve is grossly normal. Trivial mitral valve  regurgitation. No evidence of mitral stenosis.   5. The aortic valve  is tricuspid. There is mild calcification of the  aortic valve. Aortic valve regurgitation is mild. No aortic stenosis is  present.   6. The inferior vena cava is dilated in size with >50% respiratory  variability, suggesting right atrial pressure of 8 mmHg.   Comparison(s): Changes from prior study are noted. The left ventricular  function has improved.   EKG:  EKG is not ordered today.    Recent Labs: 11/15/2021: ALT 25 06/01/2022: BUN 17; Creatinine, Ser 1.00; Hemoglobin 13.2; Platelets 169; Potassium 4.9; Sodium 135  Recent Lipid Panel    Component Value Date/Time   CHOL 138 12/19/2021 1227   TRIG 45 12/19/2021 1227   HDL 73 12/19/2021 1227   CHOLHDL 1.9 12/19/2021 1227   LDLCALC 54 12/19/2021 1227   LDLDIRECT 130 (H) 09/13/2017 1358     Risk Assessment/Calculations:              Physical Exam:    VS:  BP 116/74   Pulse 73   Ht 6' (1.829 m)   Wt 181 lb (82.1 kg)   SpO2 99%   BMI 24.55 kg/m     Wt Readings from Last 3 Encounters:  10/16/22 181 lb (82.1 kg)  06/02/22 182 lb (82.6 kg)  06/01/22 182 lb 3.2 oz (82.6 kg)     GEN:  Well nourished, well developed in no acute distress HEENT: Normal NECK: No JVD; No carotid bruits LYMPHATICS: No lymphadenopathy CARDIAC: RRR, no murmurs, rubs, gallops RESPIRATORY:  Clear to auscultation without rales, wheezing or rhonchi  ABDOMEN: Soft, non-tender, non-distended MUSCULOSKELETAL:  No edema; No deformity  SKIN: Warm and dry NEUROLOGIC:  Alert and oriented x 3 PSYCHIATRIC:  Normal affect   ASSESSMENT:    1. Coronary artery disease of native artery of native heart with stable angina pectoris (HCC)   2. Persistent atrial fibrillation ([place])   3. Complete heart block ([place])   4. Mixed hyperlipidemia    PLAN:    In  order of problems listed above:  The patient is stable, CCS functional class II anginal symptoms.  We discussed his situation at length.  We reviewed his last cardiac catheterization study from 2020.  He reports no change in his functional capacity or anginal threshold over the past 6 months.  I offered him titration for initiation of antianginal therapy with findings.  Any more medications at this time.  He will continue on aspirin for antiplatelet therapy.  With mild symptoms in a stable pattern, I think continued medical therapy is appropriate.  He understands to contact me if there is any change in his anginal threshold and we would proceed with repeat cardiac catheterization.  Functional testing has not been reliable for him in the past. Anticoagulated with apixaban.  Stable symptoms.  Most recent echo reviewed.  LVEF 50 to 55%. Status post permanent pacemaker.  Patient followed by Dr. Caryl Comes. Treated with Repatha.  Last lipids with an LDL cholesterol of 54.  Patient statin intolerant.  Repeat lipids and LFTs today.      Medication Adjustments/Labs and Tests Ordered: Current medicines are reviewed at length with the patient today.  Concerns regarding medicines are outlined above.  Orders Placed This Encounter  Procedures   Lipid panel   Hepatic function panel   No orders of the defined types were placed in this encounter.   Patient Instructions  Medication Instructions:  Your physician recommends that you continue on your current medications as directed. Please refer to the Current Medication list given  to you today.  *If you need a refill on your cardiac medications before your next appointment, please call your pharmacy*   Lab Work: Lipids, Liver today If you have labs (blood work) drawn today and your tests are completely normal, you will receive your results only by: [place] (if you have MyChart) OR A paper copy in the mail If you have any lab test that is abnormal or  we need to change your treatment, we will call you to review the results.   Testing/Procedures: NONE   Follow-Up: At [facility], you and your health needs are our priority.  As part of our continuing mission to provide you with exceptional heart care, we have created designated Provider Care Teams.  These Care Teams include your primary Cardiologist (physician) and Advanced Practice Providers (APPs -  Physician Assistants and Nurse Practitioners) who all work together to provide you with the care you need, when you need it.  We recommend signing up for the patient portal called "MyChart".  Sign up information is provided on this After Visit Summary.  MyChart is used to connect with patients for Virtual Visits (Telemedicine).  Patients are able to view lab/test results, encounter notes, upcoming appointments, etc.  Non-urgent messages can be sent to your provider as well.   To learn more about what you can do with MyChart, go to [email or web address].    Your next appointment:   04/19/23 @ 8:20am  The format for your next appointment:   In Person  Provider:   [name], MD       Important Information About Sugar         Signed, [name], MD  10/16/2022 1:33 PM    [place]

## 2022-10-17 LAB — CUP PACEART REMOTE DEVICE CHECK
Battery Remaining Longevity: 30 mo
Battery Voltage: 2.9 V
Brady Statistic RA Percent Paced: 9.45 %
Brady Statistic RV Percent Paced: 98.99 %
Date Time Interrogation Session: 20231130225344
Implantable Lead Connection Status: 753985
Implantable Lead Connection Status: 753985
Implantable Lead Connection Status: 753985
Implantable Lead Implant Date: 20130517
Implantable Lead Implant Date: 20130710
Implantable Lead Implant Date: 20170802
Implantable Lead Location: 753858
Implantable Lead Location: 753859
Implantable Lead Location: 753860
Implantable Pulse Generator Implant Date: 20170802
Lead Channel Impedance Value: 1007 Ohm
Lead Channel Impedance Value: 1064 Ohm
Lead Channel Impedance Value: 1121 Ohm
Lead Channel Impedance Value: 342 Ohm
Lead Channel Impedance Value: 361 Ohm
Lead Channel Impedance Value: 418 Ohm
Lead Channel Impedance Value: 418 Ohm
Lead Channel Impedance Value: 456 Ohm
Lead Channel Impedance Value: 494 Ohm
Lead Channel Impedance Value: 551 Ohm
Lead Channel Impedance Value: 665 Ohm
Lead Channel Impedance Value: 779 Ohm
Lead Channel Impedance Value: 893 Ohm
Lead Channel Impedance Value: 931 Ohm
Lead Channel Pacing Threshold Amplitude: 0.75 V
Lead Channel Pacing Threshold Amplitude: 1.125 V
Lead Channel Pacing Threshold Amplitude: 1.375 V
Lead Channel Pacing Threshold Pulse Width: 0.4 ms
Lead Channel Pacing Threshold Pulse Width: 0.4 ms
Lead Channel Pacing Threshold Pulse Width: 0.8 ms
Lead Channel Sensing Intrinsic Amplitude: 1 mV
Lead Channel Sensing Intrinsic Amplitude: 1 mV
Lead Channel Sensing Intrinsic Amplitude: 14.25 mV
Lead Channel Sensing Intrinsic Amplitude: 14.25 mV
Lead Channel Setting Pacing Amplitude: 2 V
Lead Channel Setting Pacing Amplitude: 2.25 V
Lead Channel Setting Pacing Amplitude: 2.5 V
Lead Channel Setting Pacing Pulse Width: 0.4 ms
Lead Channel Setting Pacing Pulse Width: 1 ms
Lead Channel Setting Sensing Sensitivity: 4 mV
Zone Setting Status: 755011
Zone Setting Status: 755011

## 2022-10-21 ENCOUNTER — Ambulatory Visit (INDEPENDENT_AMBULATORY_CARE_PROVIDER_SITE_OTHER): Payer: Medicare Other

## 2022-10-21 DIAGNOSIS — L209 Atopic dermatitis, unspecified: Secondary | ICD-10-CM | POA: Diagnosis not present

## 2022-11-03 NOTE — Progress Notes (Signed)
Remote pacemaker transmission.   

## 2022-11-10 NOTE — Progress Notes (Signed)
Remote pacemaker transmission.   

## 2022-11-12 ENCOUNTER — Ambulatory Visit (INDEPENDENT_AMBULATORY_CARE_PROVIDER_SITE_OTHER): Payer: Medicare Other

## 2022-11-12 DIAGNOSIS — L209 Atopic dermatitis, unspecified: Secondary | ICD-10-CM

## 2022-12-02 ENCOUNTER — Ambulatory Visit (INDEPENDENT_AMBULATORY_CARE_PROVIDER_SITE_OTHER): Payer: Medicare Other

## 2022-12-02 DIAGNOSIS — L209 Atopic dermatitis, unspecified: Secondary | ICD-10-CM | POA: Diagnosis not present

## 2022-12-23 ENCOUNTER — Ambulatory Visit (INDEPENDENT_AMBULATORY_CARE_PROVIDER_SITE_OTHER): Payer: Medicare Other

## 2022-12-23 DIAGNOSIS — L209 Atopic dermatitis, unspecified: Secondary | ICD-10-CM | POA: Diagnosis not present

## 2023-01-13 ENCOUNTER — Ambulatory Visit: Payer: Medicare Other

## 2023-01-13 DIAGNOSIS — I495 Sick sinus syndrome: Secondary | ICD-10-CM | POA: Diagnosis not present

## 2023-01-13 LAB — CUP PACEART REMOTE DEVICE CHECK
Battery Remaining Longevity: 27 mo
Battery Voltage: 2.89 V
Brady Statistic AP VP Percent: 54.71 %
Brady Statistic AP VS Percent: 0.06 %
Brady Statistic AS VP Percent: 44.45 %
Brady Statistic AS VS Percent: 0.69 %
Brady Statistic RA Percent Paced: 38.85 %
Brady Statistic RV Percent Paced: 99.13 %
Date Time Interrogation Session: 20240227224825
Implantable Lead Connection Status: 753985
Implantable Lead Connection Status: 753985
Implantable Lead Connection Status: 753985
Implantable Lead Implant Date: 20130517
Implantable Lead Implant Date: 20130710
Implantable Lead Implant Date: 20170802
Implantable Lead Location: 753858
Implantable Lead Location: 753859
Implantable Lead Location: 753860
Implantable Pulse Generator Implant Date: 20170802
Lead Channel Impedance Value: 1007 Ohm
Lead Channel Impedance Value: 1064 Ohm
Lead Channel Impedance Value: 1102 Ohm
Lead Channel Impedance Value: 342 Ohm
Lead Channel Impedance Value: 361 Ohm
Lead Channel Impedance Value: 418 Ohm
Lead Channel Impedance Value: 437 Ohm
Lead Channel Impedance Value: 456 Ohm
Lead Channel Impedance Value: 494 Ohm
Lead Channel Impedance Value: 532 Ohm
Lead Channel Impedance Value: 665 Ohm
Lead Channel Impedance Value: 798 Ohm
Lead Channel Impedance Value: 855 Ohm
Lead Channel Impedance Value: 893 Ohm
Lead Channel Pacing Threshold Amplitude: 0.75 V
Lead Channel Pacing Threshold Amplitude: 1.125 V
Lead Channel Pacing Threshold Amplitude: 1.375 V
Lead Channel Pacing Threshold Pulse Width: 0.4 ms
Lead Channel Pacing Threshold Pulse Width: 0.4 ms
Lead Channel Pacing Threshold Pulse Width: 0.8 ms
Lead Channel Sensing Intrinsic Amplitude: 1.375 mV
Lead Channel Sensing Intrinsic Amplitude: 1.375 mV
Lead Channel Sensing Intrinsic Amplitude: 14.25 mV
Lead Channel Sensing Intrinsic Amplitude: 14.25 mV
Lead Channel Setting Pacing Amplitude: 2 V
Lead Channel Setting Pacing Amplitude: 2.25 V
Lead Channel Setting Pacing Amplitude: 2.5 V
Lead Channel Setting Pacing Pulse Width: 0.4 ms
Lead Channel Setting Pacing Pulse Width: 1 ms
Lead Channel Setting Sensing Sensitivity: 4 mV
Zone Setting Status: 755011
Zone Setting Status: 755011

## 2023-01-20 ENCOUNTER — Ambulatory Visit (INDEPENDENT_AMBULATORY_CARE_PROVIDER_SITE_OTHER): Payer: Medicare Other

## 2023-01-20 DIAGNOSIS — L209 Atopic dermatitis, unspecified: Secondary | ICD-10-CM | POA: Diagnosis not present

## 2023-02-10 ENCOUNTER — Ambulatory Visit (INDEPENDENT_AMBULATORY_CARE_PROVIDER_SITE_OTHER): Payer: Medicare Other

## 2023-02-10 DIAGNOSIS — L209 Atopic dermatitis, unspecified: Secondary | ICD-10-CM | POA: Diagnosis not present

## 2023-02-17 NOTE — Progress Notes (Signed)
Remote pacemaker transmission.   

## 2023-02-19 ENCOUNTER — Telehealth: Payer: Self-pay | Admitting: Internal Medicine

## 2023-02-19 NOTE — Telephone Encounter (Signed)
Pt is requesting a callback in regards to him having a periodontal surgery coming up. Please advise.

## 2023-02-19 NOTE — Telephone Encounter (Signed)
   Pre-operative Risk Assessment    Patient Name: Walter SHIPES, MD  DOB: [birth year] MRN: [id number]     Request for Surgical Clearance    Procedure:  Dental Extraction - Amount of Teeth to be Pulled:  NO TEETH TO BE PULLED, PER DENTIST OFFICE, PT IS HAVING SEVERAL GINGIVAL GRAFTS AND COULD LEAD TO SOME POST OP BLEEDING FROM THE PALATAL DONOR SIDE.   Date of Surgery:  Clearance TBD                                 Surgeon:  DR. Filbert Berthold, DDS Surgeon's Group or Practice Name:  [facility] Phone number:  [phone] Fax number:  [phone]   Type of Clearance Requested:   - Pharmacy:  Hold Apixaban (Eliquis) A FEW DAYS PRIOR, AND IF SO, HOW LONG CAN HE HOLD IT?   Type of Anesthesia:  Not Indicated   Additional requests/questions:    Signed, Elliot Cousin   02/19/2023, 10:25 AM

## 2023-02-19 NOTE — Telephone Encounter (Signed)
Placed a call to Dr. Lacretia Leigh office, they will send over Preop Clearance request for pt.

## 2023-02-19 NOTE — Telephone Encounter (Signed)
Spoke with pt who states he is planning to have periodontal surgery by Dr Lucky Cowboy. Office is requesting Eliquis hold.  Pt advised office will need to fax a clearance request to our office for further advisement.  Pt verbalizes understanding but states he has never had to do this before that Dr Graciela Husbands has always just advised on holding Eliquis.

## 2023-02-22 NOTE — Telephone Encounter (Signed)
Patient with diagnosis of atrial fibrillation on Eliquis for anticoagulation.    Procedure:  Dental Extraction - Amount of Teeth to be Pulled:  NO TEETH TO BE PULLED, PER DENTIST OFFICE, PT IS HAVING SEVERAL GINGIVAL GRAFTS AND COULD LEAD TO SOME POST OP BLEEDING FROM THE PALATAL DONOR SIDE.    Date of Surgery:  Clearance TBD      CHA2DS2-VASc Score = 5   This indicates a 7.2% annual risk of stroke. The patient's score is based upon: CHF History: 1 HTN History: 1 Diabetes History: 0 Stroke History: 0 Vascular Disease History: 1 Age Score: 2 Gender Score: 0   CrCl 71 Platelet count 169  Patient does not require pre-op antibiotics for dental procedure.  Per office protocol, patient can hold Eliquis for 2 days prior to procedure.   Patient will not need bridging with Lovenox (enoxaparin) around procedure.  **This guidance is not considered finalized until pre-operative APP has relayed final recommendations.**

## 2023-02-22 NOTE — Telephone Encounter (Signed)
     Primary Cardiologist: Tonny Bollman, MD  Clinical pharmacist have reviewed patient chart as part of pre-operative protocol coverage.  The following recommendations were made for, Alfonse Flavors, MD   Patient does not require pre-op antibiotics for dental procedure.   Per office protocol, patient can hold Eliquis for 2 days prior to procedure.   Patient will not need bridging with Lovenox (enoxaparin) around procedure.  I will route this recommendation to the requesting party via Epic fax function and remove from pre-op pool.  Please call with questions.  Thomasene Ripple. Quashon Jesus NP-C     02/22/2023, 11:30 AM [facility] [address] Office [phone] Fax [phone]

## 2023-02-24 ENCOUNTER — Telehealth: Payer: Self-pay | Admitting: Cardiovascular Disease

## 2023-02-24 NOTE — Telephone Encounter (Signed)
Pt dropped off his Lab results today the forms will be in [facility] box. Thanks

## 2023-02-25 NOTE — Telephone Encounter (Signed)
Copies of lab work received and sent to be scanned into chart.

## 2023-03-03 ENCOUNTER — Ambulatory Visit (INDEPENDENT_AMBULATORY_CARE_PROVIDER_SITE_OTHER): Payer: Medicare Other

## 2023-03-03 DIAGNOSIS — L209 Atopic dermatitis, unspecified: Secondary | ICD-10-CM

## 2023-03-24 ENCOUNTER — Ambulatory Visit: Payer: Medicare Other

## 2023-03-25 ENCOUNTER — Telehealth: Payer: Self-pay | Admitting: *Deleted

## 2023-03-25 NOTE — Telephone Encounter (Signed)
L/m for patient needs MD appt for Dupixent reapproval 

## 2023-03-29 ENCOUNTER — Ambulatory Visit (INDEPENDENT_AMBULATORY_CARE_PROVIDER_SITE_OTHER): Payer: Medicare Other | Admitting: *Deleted

## 2023-03-29 DIAGNOSIS — L209 Atopic dermatitis, unspecified: Secondary | ICD-10-CM

## 2023-04-05 ENCOUNTER — Other Ambulatory Visit: Payer: Self-pay | Admitting: Allergy and Immunology

## 2023-04-06 ENCOUNTER — Encounter: Payer: Self-pay | Admitting: Allergy and Immunology

## 2023-04-06 ENCOUNTER — Ambulatory Visit (INDEPENDENT_AMBULATORY_CARE_PROVIDER_SITE_OTHER): Payer: Medicare Other | Admitting: Allergy and Immunology

## 2023-04-06 VITALS — BP 110/82 | HR 88 | Temp 98.2°F | Resp 18 | Ht 72.25 in | Wt 179.7 lb

## 2023-04-06 DIAGNOSIS — L2089 Other atopic dermatitis: Secondary | ICD-10-CM

## 2023-04-06 MED ORDER — FLUOCINONIDE 0.05 % EX OINT: TOPICAL_OINTMENT | CUTANEOUS | 11 refills | Status: AC

## 2023-04-06 NOTE — Progress Notes (Unsigned)
[place] [facility] [place] [facility] [place] - [place] - [place]   Follow-up Note  Referring Provider: Kirby Funk, MD Primary Provider: Kirby Funk, MD Date of Office Visit: 04/06/2023  Subjective:   Alfonse Flavors, MD (DOB: [birth year]) is a 77 y.o. male who [facility] in re-evaluation of the following:  HPI: Ajani returns to this clinic in evaluation of atopic dermatitis.  I last saw him in this clinic 21 April 2022.  He continues to have very good control of his atopic dermatitis as long as he remains on dupilumab.  He is attempted to stretch out the interval of use from every 2 weeks to every 3 weeks and he does okay at every 3 weeks but if he goes every 4 weeks he starts to develop some significant itchiness and some inflammation of his skin.  There is some vitiligo developing on his arms and to some degree his left leg and neck and there does appear to be a correlation between the injections of dupilumab and the onset of this vitiligo.  Certainly there is not a causal relationship but there is a relationship in time.  This does not appear to be getting any worse at this point.  Allergies as of 04/06/2023       Reactions   Ace Inhibitors Other (See Comments)   Does not take due to "angiotensin is responsible for maintaining bladder sphincter tone"   Amlodipine Other (See Comments)   Other reaction(s): lethargy   Cefaclor Hives, Nausea And Vomiting, Rash   Chlorhexidine Itching, Rash   Statins Other (See Comments), Rash   Muscle weakness Other reaction(s): Other (See Comments)   Xarelto [rivaroxaban] Other (See Comments)   Unknown        Medication List    acyclovir 400 MG tablet Commonly known as: ZOVIRAX Take 400 mg by mouth as needed.   apixaban 5 MG Tabs tablet Commonly known as: ELIQUIS Take 1 tablet (5 mg total) by mouth 2 (two) times daily.   aspirin EC 81 MG tablet Take 1 tablet (81 mg total) by mouth daily. Swallow  whole.   cetirizine 10 MG tablet Commonly known as: ZYRTEC Take 1 tablet (10 mg total) by mouth 2 (two) times daily.   Cholecalciferol 25 MCG (1000 UT) tablet TAKE ONE TABLET BY MOUTH DAILY FOR LOW VITAMIN D   Dupixent 300 MG/2ML prefilled syringe Generic drug: dupilumab INJECT 300 MG UNDER THE SKIN EVERY 2 WEEKS   EPINEPHrine 0.3 mg/0.3 mL Soaj injection Commonly known as: EPI-PEN Inject 0.3 mg into the muscle as needed.   losartan 25 MG tablet Commonly known as: COZAAR Take 25 mg by mouth daily.   metroNIDAZOLE 0.75 % gel Commonly known as: METROGEL Apply 1 application topically as needed.   nitroGLYCERIN 0.4 MG SL tablet Commonly known as: NITROSTAT Place 0.4 mg under the tongue every 5 (five) minutes as needed for chest pain (x 3 tabs).   propranolol 10 MG tablet Commonly known as: INDERAL 1 tablet   Repatha SureClick 140 MG/ML Soaj Generic drug: Evolocumab INJECT 1 PEN UNDER THE SKIN EVERY 14 DAYS    Past Medical History:  Diagnosis Date   Atrial flutter -atypical 2007   ABLATION x2 [facility]   Atrial tachycardia 2007   ABLATION [facility]   Benign paroxysmal vertigo    CAD (coronary artery disease)    a. abnl mv 01/2012;  b. cath 05/23/2012 LCX 80@ bifurcation w/ OM, 95% @ AV groove,  otw nonobs dzs, EF 55-65%;   Carcinoma (HCC)    skin cancer "off my chest"   Familial tremor    First degree AV block    had 4 ablasions that left py with heart block   History of blood transfusion 2010   "2nd day after prostatectomy"   Hyperlipidemia    Hypertension    Incontinence    DECREASING INCONTINENCE RELATED TO PROSTATECTOMY.   Migraines    Presence of permanent cardiac pacemaker 2013 ?   Prostate cancer (HCC)    Pulsatile tinnitus    Tachy-brady syndrome (HCC)    a. developed after RFCA @ [facility] 2013-> MDT PPM;  b. s/p RA lead revision 05/2012    Past Surgical History:  Procedure Laterality Date   ATRIAL ABLATION SURGERY  04/01/12   "4th time I had one"   CARDIAC  CATHETERIZATION     CARDIOVERSION  12/03/2011   Procedure: CARDIOVERSION;  Surgeon: Duke Salvia, MD;  Location: [facility] OR;  Service: Cardiovascular;  Laterality: [name];   CARDIOVERSION  01/13/2012   Procedure: CARDIOVERSION;  Surgeon: Lewayne Bunting, MD;  Location: [facility] OR;  Service: Cardiovascular;  Laterality: [name];   CARDIOVERSION  04/27/2012   Procedure: CARDIOVERSION;  Surgeon: Cassell Clement, MD;  Location: [facility] OR;  Service: Cardiovascular;  Laterality: [name];   COLONOSCOPY     COLONOSCOPY WITH PROPOFOL [name] 10/29/2016   Procedure: COLONOSCOPY WITH PROPOFOL;  Surgeon: Sherrilyn Rist, MD;  Location: [facility]COPY;  Service: Gastroenterology;  Laterality: [name];   CORONARY ANGIOPLASTY WITH STENT PLACEMENT  06/02/12   "1; first one ever"   CORONARY STENT INTERVENTION [name] 07/21/2017   Procedure: CORONARY STENT INTERVENTION;  Surgeon: Tonny Bollman, MD;  Location: [facility] INVASIVE CV LAB;  Service: Cardiovascular;  Laterality: [name];   CORONARY STENT INTERVENTION [name] 12/13/2018   Procedure: CORONARY STENT INTERVENTION;  Surgeon: Tonny Bollman, MD;  Location: [facility] INVASIVE CV LAB;  Service: Cardiovascular;  Laterality: [name];   EP IMPLANTABLE DEVICE [name] 06/17/2016   Procedure: BiV Pacemaker upgrade;  Surgeon: Duke Salvia, MD;  Location: [facility] INVASIVE CV LAB;  Service: Cardiovascular;  Laterality: [name];   HERNIA REPAIR  1980's   INGUINAL HERNIA REPAIR  1945   left side   INSERT / REPLACE / REMOVE PACEMAKER  03/2012   initial placement   INSERT / REPLACE / REMOVE PACEMAKER  05/25/12   "atrial lead change"   LEAD REVISION [name] 05/25/2012   Procedure: LEAD REVISION;  Surgeon: Marinus Maw, MD;  Location: [facility] CATH LAB;  Service: Cardiovascular;  Laterality: [name];   LEFT HEART CATH AND CORONARY ANGIOGRAPHY [name] 07/21/2017   Procedure: LEFT HEART CATH AND CORONARY ANGIOGRAPHY;  Surgeon: Tonny Bollman, MD;  Location: [facility] INVASIVE CV LAB;  Service: Cardiovascular;  Laterality: [name];   LEFT HEART CATH AND CORONARY ANGIOGRAPHY  [name] 12/13/2018   Procedure: LEFT HEART CATH AND CORONARY ANGIOGRAPHY;  Surgeon: Tonny Bollman, MD;  Location: [facility] INVASIVE CV LAB;  Service: Cardiovascular;  Laterality: [name];   PERCUTANEOUS CORONARY STENT INTERVENTION (PCI-S) [name] 06/02/2012   Procedure: PERCUTANEOUS CORONARY STENT INTERVENTION (PCI-S);  Surgeon: Herby Abraham, MD;  Location: [facility] CATH LAB;  Service: Cardiovascular;  Laterality: [name];   PROSTATECTOMY  02/20/2009   COMPLICATED   SHOULDER SURGERY     Bilateral; "boney spurs removed"   SKIN CANCER EXCISION     "in situ; right chest"   TONSILLECTOMY AND ADENOIDECTOMY     "as a child"    Review of systems negative except  as noted in HPI / PMHx or noted below:  Review of Systems  Constitutional: Negative.   HENT: Negative.    Eyes: Negative.   Respiratory: Negative.    Cardiovascular: Negative.   Gastrointestinal: Negative.   Genitourinary: Negative.   Musculoskeletal: Negative.   Skin: Negative.   Neurological: Negative.   Endo/Heme/Allergies: Negative.   Psychiatric/Behavioral: Negative.       Objective:   Vitals:   04/06/23 1029  BP: 110/82  Pulse: 88  Resp: 18  Temp: 98.2 F (36.8 C)  SpO2: 97%   Height: 6' 0.25" (183.5 cm)  Weight: 179 lb 11.2 oz (81.5 kg)   Physical Exam Skin:    Findings: Rash (Vitiligo arms) present.     Diagnostics: none  Assessment and Plan:   1. Other atopic dermatitis     1.  Continue dupilumab injections every 2 weeks  2.  If needed:   A.  Cetirizine 10 mg - 1 tablet 1-2 times per day  B.  Topical fluocinonide ointment 1-7 times per week  3.  Return to clinic in 1 year or earlier if problem  Paytin appears to be doing wonderful while using his dupilumab and he will continue to utilize this agent over the course of this upcoming year and we will see him back in this clinic at that point time or earlier if there is a problem.  Laurette Schimke, MD Allergy / Immunology [facility]

## 2023-04-06 NOTE — Patient Instructions (Signed)
  1.  Continue dupilumab injections every 2 weeks  2.  If needed:   A.  Cetirizine 10 mg - 1 tablet 1-2 times per day  B.  Topical fluocinonide ointment 1-7 times per week  3.  Return to clinic in 1 year or earlier if problem   

## 2023-04-07 ENCOUNTER — Encounter: Payer: Self-pay | Admitting: Allergy and Immunology

## 2023-04-14 ENCOUNTER — Ambulatory Visit (INDEPENDENT_AMBULATORY_CARE_PROVIDER_SITE_OTHER): Payer: Medicare Other

## 2023-04-14 DIAGNOSIS — I495 Sick sinus syndrome: Secondary | ICD-10-CM

## 2023-04-15 LAB — CUP PACEART REMOTE DEVICE CHECK
Battery Remaining Longevity: 26 mo
Battery Voltage: 2.87 V
Brady Statistic AP VP Percent: 72.44 %
Brady Statistic AP VS Percent: 0.01 %
Brady Statistic AS VP Percent: 26.93 %
Brady Statistic AS VS Percent: 0.55 %
Brady Statistic RA Percent Paced: 34.06 %
Brady Statistic RV Percent Paced: 99.33 %
Date Time Interrogation Session: 20240528211753
Implantable Lead Connection Status: 753985
Implantable Lead Connection Status: 753985
Implantable Lead Connection Status: 753985
Implantable Lead Implant Date: 20130517
Implantable Lead Implant Date: 20130710
Implantable Lead Implant Date: 20170802
Implantable Lead Location: 753858
Implantable Lead Location: 753859
Implantable Lead Location: 753860
Implantable Pulse Generator Implant Date: 20170802
Lead Channel Impedance Value: 1102 Ohm
Lead Channel Impedance Value: 1121 Ohm
Lead Channel Impedance Value: 1178 Ohm
Lead Channel Impedance Value: 342 Ohm
Lead Channel Impedance Value: 361 Ohm
Lead Channel Impedance Value: 418 Ohm
Lead Channel Impedance Value: 437 Ohm
Lead Channel Impedance Value: 437 Ohm
Lead Channel Impedance Value: 475 Ohm
Lead Channel Impedance Value: 532 Ohm
Lead Channel Impedance Value: 760 Ohm
Lead Channel Impedance Value: 779 Ohm
Lead Channel Impedance Value: 855 Ohm
Lead Channel Impedance Value: 874 Ohm
Lead Channel Pacing Threshold Amplitude: 0.75 V
Lead Channel Pacing Threshold Amplitude: 1.125 V
Lead Channel Pacing Threshold Amplitude: 1.375 V
Lead Channel Pacing Threshold Pulse Width: 0.4 ms
Lead Channel Pacing Threshold Pulse Width: 0.4 ms
Lead Channel Pacing Threshold Pulse Width: 0.8 ms
Lead Channel Sensing Intrinsic Amplitude: 0.75 mV
Lead Channel Sensing Intrinsic Amplitude: 0.75 mV
Lead Channel Sensing Intrinsic Amplitude: 14.25 mV
Lead Channel Sensing Intrinsic Amplitude: 14.25 mV
Lead Channel Setting Pacing Amplitude: 2 V
Lead Channel Setting Pacing Amplitude: 2.25 V
Lead Channel Setting Pacing Amplitude: 2.5 V
Lead Channel Setting Pacing Pulse Width: 0.4 ms
Lead Channel Setting Pacing Pulse Width: 1 ms
Lead Channel Setting Sensing Sensitivity: 4 mV
Zone Setting Status: 755011
Zone Setting Status: 755011

## 2023-04-19 ENCOUNTER — Encounter: Payer: Self-pay | Admitting: Cardiovascular Disease

## 2023-04-19 ENCOUNTER — Ambulatory Visit: Payer: Medicare Other | Attending: Cardiovascular Disease | Admitting: Cardiovascular Disease

## 2023-04-19 VITALS — BP 130/86 | HR 73 | Ht 72.0 in | Wt 179.2 lb

## 2023-04-19 DIAGNOSIS — I428 Other cardiomyopathies: Secondary | ICD-10-CM | POA: Insufficient documentation

## 2023-04-19 DIAGNOSIS — E782 Mixed hyperlipidemia: Secondary | ICD-10-CM | POA: Diagnosis not present

## 2023-04-19 DIAGNOSIS — I25118 Atherosclerotic heart disease of native coronary artery with other forms of angina pectoris: Secondary | ICD-10-CM | POA: Diagnosis present

## 2023-04-19 DIAGNOSIS — I4819 Other persistent atrial fibrillation: Secondary | ICD-10-CM | POA: Diagnosis present

## 2023-04-19 MED ORDER — REPATHA SURECLICK 140 MG/ML ~~LOC~~ SOAJ: SUBCUTANEOUS | 11 refills | Status: DC

## 2023-04-19 NOTE — Patient Instructions (Signed)
Medication Instructions:  REFILLED Repatha *If you need a refill on your cardiac medications before your next appointment, please call your pharmacy*   Lab Work: CBC, CMET, Lipids prior to next visit If you have labs (blood work) drawn today and your tests are completely normal, you will receive your results only by: MyChart Message (if you have MyChart) OR A paper copy in the mail If you have any lab test that is abnormal or we need to change your treatment, we will call you to review the results.   Testing/Procedures: NONE   Follow-Up: At [facility], you and your health needs are our priority.  As part of our continuing mission to provide you with exceptional heart care, we have created designated Provider Care Teams.  These Care Teams include your primary Cardiologist (physician) and Advanced Practice Providers (APPs -  Physician Assistants and Nurse Practitioners) who all work together to provide you with the care you need, when you need it.  We recommend signing up for the patient portal called "MyChart".  Sign up information is provided on this After Visit Summary.  MyChart is used to connect with patients for Virtual Visits (Telemedicine).  Patients are able to view lab/test results, encounter notes, upcoming appointments, etc.  Non-urgent messages can be sent to your provider as well.   To learn more about what you can do with MyChart, go to [email or web address].    Your next appointment:   6 month(s)  Provider:   Tonny Bollman, MD

## 2023-04-19 NOTE — Progress Notes (Signed)
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  The patient was last seen here October 16, 2022.  The patient has been followed by Dr. Graciela Husbands for atrial fibrillation and he has a history of A. fib ablation.  He has undergone stenting of the LAD and left circumflex in the past.  He was found to have a new lesion at the time of his most recent heart catheterization in January 2020.  He again underwent PCI using a drug-eluting stent.  He has been maintained on a combination of apixaban for atrial fibrillation and aspirin for coronary artery disease.    He is here alone today.  He has been doing fairly well from a cardiac perspective.  He has occasional chest and arm discomfort when walking up the hill, but reports no recent change in symptoms.  His primary complaint is generalized fatigue.  He denies orthopnea, PND, heart palpitations, or shortness of breath.  Denies any recent problems with angina.  No bleeding problems on a combination of apixaban and low-dose aspirin.  Past Medical History:  Diagnosis Date   Atrial flutter -atypical 2007   ABLATION x2 [facility]   Atrial tachycardia 2007   ABLATION [facility]   Benign paroxysmal vertigo    CAD (coronary artery disease)    a. abnl mv 01/2012;  b. cath 05/23/2012 LCX 80@ bifurcation w/ OM, 95% @ AV groove, otw nonobs dzs, EF 55-65%;   Carcinoma (HCC)    skin cancer "off my chest"   Familial tremor    First degree AV block    had 4 ablasions that left py with heart block   History of blood transfusion 2010   "2nd day after  prostatectomy"   Hyperlipidemia    Hypertension    Incontinence    DECREASING INCONTINENCE RELATED TO PROSTATECTOMY.   Migraines    Presence of permanent cardiac pacemaker 2013 ?   Prostate cancer (HCC)    Pulsatile tinnitus    Tachy-brady syndrome (HCC)    a. developed after RFCA @ [facility] 2013-> MDT PPM;  b. s/p RA lead revision 05/2012    Past Surgical History:  Procedure Laterality Date   ATRIAL ABLATION SURGERY  04/01/12   "4th time I had one"   CARDIAC CATHETERIZATION     CARDIOVERSION  12/03/2011   Procedure: CARDIOVERSION;  Surgeon: Duke Salvia, MD;  Location: [facility] OR;  Service: Cardiovascular;  Laterality: [name];   CARDIOVERSION  01/13/2012   Procedure: CARDIOVERSION;  Surgeon: Lewayne Bunting, MD;  Location: [facility] OR;  Service: Cardiovascular;  Laterality: [name];   CARDIOVERSION  04/27/2012   Procedure: CARDIOVERSION;  Surgeon: Cassell Clement, MD;  Location: [facility] OR;  Service: Cardiovascular;  Laterality: [name];   COLONOSCOPY     COLONOSCOPY WITH PROPOFOL [name] 10/29/2016   Procedure: COLONOSCOPY WITH PROPOFOL;  Surgeon: Sherrilyn Rist, MD;  Location: [facility] ENDOSCOPY;  Service: Gastroenterology;  Laterality: [name];   CORONARY ANGIOPLASTY WITH STENT PLACEMENT  06/02/12   "1; first one ever"   CORONARY STENT  INTERVENTION [name] 07/21/2017   Procedure: CORONARY STENT INTERVENTION;  Surgeon: Tonny Bollman, MD;  Location: [facility] INVASIVE CV LAB;  Service: Cardiovascular;  Laterality: [name];   CORONARY STENT INTERVENTION [name] 12/13/2018   Procedure: CORONARY STENT INTERVENTION;  Surgeon: Tonny Bollman, MD;  Location: [facility] INVASIVE CV LAB;  Service: Cardiovascular;  Laterality: [name];   EP IMPLANTABLE DEVICE [name] 06/17/2016   Procedure: BiV Pacemaker upgrade;  Surgeon: Duke Salvia, MD;  Location: [facility] INVASIVE CV LAB;  Service: Cardiovascular;  Laterality: [name];   HERNIA REPAIR  1980's   INGUINAL HERNIA REPAIR  1945   left side   INSERT / REPLACE / REMOVE PACEMAKER  03/2012   initial placement   INSERT /  REPLACE / REMOVE PACEMAKER  05/25/12   "atrial lead change"   LEAD REVISION [name] 05/25/2012   Procedure: LEAD REVISION;  Surgeon: Marinus Maw, MD;  Location: [facility] CATH LAB;  Service: Cardiovascular;  Laterality: [name];   LEFT HEART CATH AND CORONARY ANGIOGRAPHY [name] 07/21/2017   Procedure: LEFT HEART CATH AND CORONARY ANGIOGRAPHY;  Surgeon: Tonny Bollman, MD;  Location: [facility] INVASIVE CV LAB;  Service: Cardiovascular;  Laterality: [name];   LEFT HEART CATH AND CORONARY ANGIOGRAPHY [name] 12/13/2018   Procedure: LEFT HEART CATH AND CORONARY ANGIOGRAPHY;  Surgeon: Tonny Bollman, MD;  Location: [facility] INVASIVE CV LAB;  Service: Cardiovascular;  Laterality: [name];   PERCUTANEOUS CORONARY STENT INTERVENTION (PCI-S) [name] 06/02/2012   Procedure: PERCUTANEOUS CORONARY STENT INTERVENTION (PCI-S);  Surgeon: Herby Abraham, MD;  Location: [facility] CATH LAB;  Service: Cardiovascular;  Laterality: [name];   PROSTATECTOMY  02/20/2009   COMPLICATED   SHOULDER SURGERY     Bilateral; "boney spurs removed"   SKIN CANCER EXCISION     "in situ; right chest"   TONSILLECTOMY AND ADENOIDECTOMY     "as a child"    Current Medications: Current Meds  Medication Sig   acyclovir (ZOVIRAX) 400 MG tablet Take 400 mg by mouth as needed.   apixaban (ELIQUIS) 5 MG TABS tablet Take 1 tablet (5 mg total) by mouth 2 (two) times daily.   aspirin EC 81 MG tablet Take 1 tablet (81 mg total) by mouth daily. Swallow whole.   cetirizine (ZYRTEC) 10 MG tablet Take 1 tablet (10 mg total) by mouth 2 (two) times daily.   Cholecalciferol 25 MCG (1000 UT) tablet TAKE ONE TABLET BY MOUTH DAILY FOR LOW VITAMIN D   dupilumab (DUPIXENT) 300 MG/2ML prefilled syringe INJECT 300 MG UNDER THE SKIN EVERY 2 WEEKS   EPINEPHrine 0.3 mg/0.3 mL IJ SOAJ injection Inject 0.3 mg into the muscle as needed.   fluocinonide ointment (LIDEX) 0.05 % 1 application 1-7 times per week as needed.   losartan (COZAAR) 25 MG tablet Take 25 mg by mouth daily.   metroNIDAZOLE (METROGEL) 0.75  % gel Apply 1 application topically as needed.   nitroGLYCERIN (NITROSTAT) 0.4 MG SL tablet Place 0.4 mg under the tongue every 5 (five) minutes as needed for chest pain (x 3 tabs).   propranolol (INDERAL) 10 MG tablet 1 tablet   [DISCONTINUED] REPATHA SURECLICK 140 MG/ML SOAJ INJECT 1 PEN UNDER THE SKIN EVERY 14 DAYS   Current Facility-Administered Medications for the 04/19/23 encounter (Office Visit) with Tonny Bollman, MD  Medication   dupilumab (DUPIXENT) prefilled syringe 300 mg     Allergies:   Ace inhibitors, Amlodipine, Cefaclor, Chlorhexidine, Statins, and Xarelto [rivaroxaban]   Social History   Socioeconomic History   Marital status: Married    Spouse name: Not  on file   Number of children: Not on file   Years of education: Not on file   Highest education level: Not on file  Occupational History   Occupation: Physician    Comment: Retired - ENT  Tobacco Use   Smoking status: Never   Smokeless tobacco: Never  Vaping Use   Vaping Use: Never used  Substance and Sexual Activity   Alcohol use: Yes    Alcohol/week: 3.0 standard drinks of alcohol    Types: 3 Standard drinks or equivalent per week    Comment: drinks alcohol at least once every 3 months 05/25/12 "last alcohol 02/2012"   Drug use: No   Sexual activity: Not Currently  Other Topics Concern   Not on file  Social History Narrative   Retired ENT   Social Determinants of Health   Financial Resource Strain: Not on file  Food Insecurity: Not on file  Transportation Needs: Not on file  Physical Activity: Not on file  Stress: Not on file  Social Connections: Not on file     Family History: The patient's family history includes Colon cancer in his sister; Diabetes in his brother; Heart disease in his brother and father. There is no history of Colon polyps, Rectal cancer, Stomach cancer, or Esophageal cancer.  ROS:   Please see the history of present illness.    All other systems reviewed and are  negative.  EKGs/Labs/Other Studies Reviewed:    The following studies were reviewed today: Cardiac Studies & Procedures   CARDIAC CATHETERIZATION  CARDIAC CATHETERIZATION 12/13/2018  Narrative  Previously placed Prox LAD drug eluting stent is widely patent.  Balloon angioplasty was performed.  Mid LAD to Dist LAD lesion is 95% stenosed.  Dist LAD lesion is 50% stenosed.  Prox Cx to Mid Cx lesion is 10% stenosed.  Dist Cx lesion is 100% stenosed.  Dist RCA lesion is 50% stenosed.  A drug-eluting stent was successfully placed using a STENT RESOLUTE ONYX 2.5X26.  Post intervention, there is a 0% residual stenosis.  There is mild left ventricular systolic dysfunction.  The left ventricular ejection fraction is 50-55% by visual estimate.  1.  Severe mid LAD stenosis treated successfully with PCI using a 2.5 x 26 mm resolute Onyx DES, postdilated to high pressure with a 3.0 mm noncompliant balloon 2.  Continued patency of the stented segment in the proximal LAD with no significant restenosis 3.  Continued patency of the stented segment in the left circumflex with chronic occlusion of the circumflex subbranch collateralized by left to left collaterals 4.  Moderate stenosis of the distal RCA, unchanged from previous studies, estimated at 50% 5.  Mild global LV systolic dysfunction with LVEF estimated at 50 to 55%  Recommendations: Resume apixaban tomorrow, continue aspirin 81 mg daily x1 month, continue clopidogrel 75 mg daily x6 months.  The patient has had multiple ACS events.  If he is tolerating a combination of apixaban and clopidogrel without significant bleeding problems, it might be reasonable to continue longer than 6 months.  Findings Coronary Findings Diagnostic  Dominance: Right  Left Anterior Descending Previously placed Prox LAD drug eluting stent is widely patent. The lesion is discrete. Proximal LAD stent remains widely patent. Mid LAD to Dist LAD lesion is 95%  stenosed. The lesion is eccentric and irregular. The lesion is calcified. diffuse stenosis Dist LAD lesion is 50% stenosed.  Left Circumflex Occluded as the AV circumflex arises from the stent site (unable to accurately depict in the diagram) Collaterals  Dist Cx filled by collaterals from 2nd Mrg.  Prox Cx to Mid Cx lesion is 10% stenosed. The lesion was previously treated using a drug eluting stent over 2 years ago. The AV circumflex is occluded at the stented segment. The stent is widely patent and extends into the OM which divides into 2 branches. The distal circumflex is collateralized from the OM branches. This is unchanged from the last cath study. Dist Cx lesion is 100% stenosed. chronic occlusion, unchanged from prior studies. Circumflex stent remains patent.  Right Coronary Artery There is mild diffuse disease throughout the vessel. Dist RCA lesion is 50% stenosed. Unchanged from the previous studies in 2013 and 2018  Intervention  Mid LAD to Dist LAD lesion Stent CATHETER LAUNCHER 6FREBU 3.5 guide catheter was inserted. Lesion crossed with guidewire using a WIRE COUGAR XT STRL 190CM. Pre-stent angioplasty was performed using a BALLOON SAPPHIRE 2.0X12. A drug-eluting stent was successfully placed using a STENT RESOLUTE ONYX 2.5X26. Post-stent angioplasty was performed using a BALLOON SAPPHIRE [place] 3.0X8. Maximum pressure:  20 atm. After stent deployment, there is no reflow in the vessel.  Intracoronary verapamil was administered.  Flow returns to normal, TIMI-3.  Angiography is performed and the midportion of the stent appears underexpanded.  The stent had initially been postdilated with a 3.0 x 15 mm noncompliant balloon.  A 3.0 x 8 mm noncompliant balloon was used to dilate the midportion of the stent to 20 atm on 2 inflations.  This greatly improved the angiographic appearance of stent expansion throughout the stent with 0% residual stenosis and TIMI-3 flow at the completion of the  procedure. Post-Intervention Lesion Assessment The intervention was successful. Pre-interventional TIMI flow is 3. Post-intervention TIMI flow is 3. No complications occurred at this lesion. There is a 0% residual stenosis post intervention.   CARDIAC CATHETERIZATION  CARDIAC CATHETERIZATION 07/21/2017  Narrative 1. Severe single-vessel coronary artery disease with critical stenosis of the proximal LAD, treated successfully with PCI using a 3.5 x 15 mm Xience [place] drug-eluting stent 2. Moderate residual coronary artery disease involving the mid right coronary artery and mid LAD 3. 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 Normal LV systolic function (improved from previous)  Recommendations: The patient is chronically anticoagulated with Xarelto for atrial fibrillation. This can be restarted tomorrow. Recommend triple therapy with aspirin 81 mg, Plavix 75 mg, and Xarelto 20 mg for a period of one month. After that time I would recommend discontinuing aspirin and remaining on a combination of Plavix and Xarelto for total of 6 months from the time of this PCI procedure.  Findings Coronary Findings Diagnostic  Dominance: Right  Left Anterior Descending The lesion is discrete. diffuse stenosis  Left Circumflex Occluded as the AV circumflex arises from the stent site (unable to accurately depict in the diagram) Collaterals Dist Cx filled by collaterals from 2nd Mrg.  The lesion was previously treated using a drug eluting stent over 2 years ago. The AV circumflex is occluded at the stented segment. The stent is widely patent and extends into the OM which divides into 2 branches. The distal circumflex is collateralized from the OM branches. This is unchanged from the last cath study.  Right Coronary Artery There is mild the vessel. Unchanged from the previous study in  2013  Intervention  Prox LAD lesion Angioplasty Lesion crossed with guidewire using a WIRE COUGAR XT STRL 190CM. Pre-stent  angioplasty was performed using a BALLOON EUPHORA RX2.5X12. A STENT SIERRA 3.50 X 15 MM drug eluting stent was successfully placed. Post-stent angioplasty was performed using a BALLOON SAPPHIRE [place] [id number]. Maximum pressure: 16 atm. The pre-interventional distal flow is normal (TIMI 3).  The post-interventional distal flow is normal (TIMI 3). The intervention was successful . No complications occurred at this lesion. Uncomplicated PCI of severe proximal LAD stenosis. Plavix 600 mg is given on the table. Heparin is used for anticoagulation with an ACT &gt; 250 seconds. There is moderate diffuse disease further down the LAD - medical therapy recommended for residual CAD. There is a 0% residual stenosis post intervention.   STRESS TESTS  MYOCARDIAL PERFUSION IMAGING 12/05/2015  Narrative  Nuclear stress EF: 47%.  This is a low risk study.  The left ventricular ejection fraction is mildly decreased (45-54%).  Low risk stress nuclear study with no ischemia or infarction; EF 47 with septal hypokinesis; mild LVE.   ECHOCARDIOGRAM  ECHOCARDIOGRAM COMPLETE 04/22/2022  Narrative ECHOCARDIOGRAM REPORT    Patient Name:   DR. Alfonse [name] Date of Exam: 04/22/2022 Medical Rec #:  [id number]        Height:       71.0 in Accession #:    [id number]       Weight:       182.0 lb Date of Birth:  Oct 20, 1946         BSA:          2.026 m Patient Age:    75 years         BP:           127/86 mmHg Patient Gender: M                HR:           77 bpm. Exam Location:  [address]  Procedure: 2D Echo, 3D Echo, Cardiac Doppler and Color Doppler  Indications:    I42.8 Cardiomyopathy, nonischemic  History:        Patient has prior history of Echocardiogram examinations, most recent 12/14/2018. CAD, Arrythmias:Atrial Fibrillation; Risk Factors:Hypertension and HLD.  Sonographer:     Clearence Ped RCS Referring Phys: [phone] Lirio Bach  IMPRESSIONS   1. Left ventricular ejection fraction, by estimation, is 50 to 55%. The left ventricle has low normal function. The left ventricle has no regional wall motion abnormalities. There is moderate asymmetric left ventricular hypertrophy of the septal segment. Left ventricular diastolic function could not be evaluated. 2. Right ventricular systolic function is normal. The right ventricular size is mildly enlarged. There is normal pulmonary artery systolic pressure. The estimated right ventricular systolic pressure is 30.5 mmHg. 3. Right atrial size was mild to moderately dilated. 4. The mitral valve is grossly normal. Trivial mitral valve regurgitation. No evidence of mitral stenosis. 5. The aortic valve is tricuspid. There is mild calcification of the aortic valve. Aortic valve regurgitation is mild. No aortic stenosis is present. 6. The inferior vena cava is dilated in size with >50% respiratory variability, suggesting right atrial pressure of 8 mmHg.  Comparison(s): Changes from prior study are noted. The left ventricular function has improved.  FINDINGS Left Ventricle: Left ventricular ejection fraction, by estimation, is 50 to 55%. The left ventricle has low normal function. The left ventricle has no regional wall motion abnormalities. 3D left ventricular ejection fraction analysis performed but not reported based on interpreter judgement due to suboptimal tracking. The left ventricular internal cavity size was normal in  size. There is moderate asymmetric left ventricular hypertrophy of the septal segment. Left ventricular diastolic function could not be evaluated due to atrial fibrillation. Left ventricular diastolic function could not be evaluated.  Right Ventricle: The right ventricular size is mildly enlarged. No increase in right ventricular wall thickness. Right ventricular systolic function is normal. There is normal  pulmonary artery systolic pressure. The tricuspid regurgitant velocity is 2.37 m/s, and with an assumed right atrial pressure of 8 mmHg, the estimated right ventricular systolic pressure is 30.5 mmHg.  Left Atrium: Left atrial size was normal in size.  Right Atrium: Right atrial size was mild to moderately dilated.  Pericardium: There is no evidence of pericardial effusion.  Mitral Valve: The mitral valve is grossly normal. Trivial mitral valve regurgitation. No evidence of mitral valve stenosis.  Tricuspid Valve: The tricuspid valve is grossly normal. Tricuspid valve regurgitation is mild . No evidence of tricuspid stenosis.  Aortic Valve: The aortic valve is tricuspid. There is mild calcification of the aortic valve. Aortic valve regurgitation is mild. Aortic regurgitation PHT measures 351 msec. No aortic stenosis is present.  Pulmonic Valve: The pulmonic valve was grossly normal. Pulmonic valve regurgitation is mild. No evidence of pulmonic stenosis.  Aorta: The aortic root and ascending aorta are structurally normal, with no evidence of dilitation.  Venous: The inferior vena cava is dilated in size with greater than 50% respiratory variability, suggesting right atrial pressure of 8 mmHg.  IAS/Shunts: The atrial septum is grossly normal.  Additional Comments: A device lead is visualized in the right atrium and right ventricle.   LEFT VENTRICLE PLAX 2D LVIDd:         4.10 cm   Diastology LVIDs:         2.70 cm   LV e' medial:    5.77 cm/s LV PW:         1.00 cm   LV E/e' medial:  12.3 LV IVS:        1.60 cm   LV e' lateral:   14.10 cm/s LVOT diam:     2.10 cm   LV E/e' lateral: 5.0 LV SV:         70 LV SV Index:   34 LVOT Area:     3.46 cm  3D Volume EF: 3D EF:        54 % LV EDV:       153 ml LV ESV:       70 ml LV SV:        83 ml  RIGHT VENTRICLE RV Basal diam:  4.50 cm RV Mid diam:    3.30 cm RV S prime:     10.10 cm/s TAPSE (M-mode): 1.5 cm RVSP:           25.5  mmHg  LEFT ATRIUM             Index        RIGHT ATRIUM           Index LA diam:        5.30 cm 2.62 cm/m   RA Pressure: 3.00 mmHg LA Vol (A2C):   59.3 ml 29.27 ml/m  RA Area:     30.00 cm LA Vol (A4C):   68.4 ml 33.77 ml/m  RA Volume:   101.00 ml 49.86 ml/m LA Biplane Vol: 66.6 ml 32.88 ml/m AORTIC VALVE LVOT Vmax:   98.10 cm/s LVOT Vmean:  65.750 cm/s LVOT VTI:    0.202 m AI PHT:  351 msec  AORTA Ao Root diam: 4.00 cm Ao Asc diam:  4.10 cm  MITRAL VALVE               TRICUSPID VALVE MV Area (PHT):             TR Peak grad:   22.5 mmHg MV Decel Time:             TR Vmax:        237.00 cm/s MV E velocity: 70.75 cm/s  Estimated RAP:  3.00 mmHg RVSP:           25.5 mmHg  SHUNTS Systemic VTI:  0.20 m Systemic Diam: 2.10 cm  Lennie Odor MD Electronically signed by Lennie Odor MD Signature Date/Time: 04/22/2022/10:01:27 AM    Final              EKG:  EKG is ordered today.  The ekg ordered today demonstrates ventricular paced rhythm 73 bpm, underlying atrial flutter, biventricular pacing pattern  Recent Labs: 06/01/2022: BUN 17; Creatinine, Ser 1.00; Hemoglobin 13.2; Platelets 169; Potassium 4.9; Sodium 135 10/16/2022: ALT 20  Recent Lipid Panel    Component Value Date/Time   CHOL 134 10/16/2022 0831   TRIG 40 10/16/2022 0831   HDL 69 10/16/2022 0831   CHOLHDL 1.9 10/16/2022 0831   LDLCALC 55 10/16/2022 0831   LDLDIRECT 130 (H) 09/13/2017 1358     Risk Assessment/Calculations:    CHA2DS2-VASc Score = 5   This indicates a 7.2% annual risk of stroke. The patient's score is based upon: CHF History: 1 HTN History: 1 Diabetes History: 0 Stroke History: 0 Vascular Disease History: 1 Age Score: 2 Gender Score: 0               Physical Exam:    VS:  BP 130/86   Pulse 73   Ht 6' (1.829 m)   Wt 179 lb 3.2 oz (81.3 kg)   SpO2 99%   BMI 24.30 kg/m     Wt Readings from Last 3 Encounters:  04/19/23 179 lb 3.2 oz (81.3 kg)  04/06/23 179 lb  11.2 oz (81.5 kg)  10/16/22 181 lb (82.1 kg)     GEN:  Well nourished, well developed in no acute distress HEENT: Normal NECK: No JVD; No carotid bruits LYMPHATICS: No lymphadenopathy CARDIAC: RRR, no murmurs, rubs, gallops RESPIRATORY:  Clear to auscultation without rales, wheezing or rhonchi  ABDOMEN: Soft, non-tender, non-distended MUSCULOSKELETAL:  No edema; No deformity  SKIN: Warm and dry NEUROLOGIC:  Alert and oriented x 3 PSYCHIATRIC:  Normal affect   ASSESSMENT:    1. Mixed hyperlipidemia   2. Cardiomyopathy, nonischemic (HCC)   3. Persistent atrial fibrillation (HCC)   4. Coronary artery disease of native artery of native heart with stable angina pectoris (HCC)    PLAN:    In order of problems listed above:  Treated with Repatha.  Last lipids with an LDL cholesterol of 55 mg/dL.  Repeat lipids prior to his next office visit in 6 months. Echo from last year reviewed.  LVEF 50 to 55%, improved from previous.  Patient with intolerance to ACE inhibitors.  He tolerates a low-dose of losartan.  Also treated with propranolol.  Continue current therapy. Anticoagulated with apixaban.  I reviewed his device interrogations and he has had a high burden of atrial fibrillation and atrial flutter for a long time.  I am not sure if this is related to his generalized fatigue or not.  He is followed  by Dr. Graciela Husbands. Stable symptoms.  No changes recommended today.  Continue 20-month follow-up.           Medication Adjustments/Labs and Tests Ordered: Current medicines are reviewed at length with the patient today.  Concerns regarding medicines are outlined above.  Orders Placed This Encounter  Procedures   Lipid panel   Comprehensive metabolic panel   CBC   EKG 12-Lead   Meds ordered this encounter  Medications   Evolocumab (REPATHA SURECLICK) 140 MG/ML SOAJ    Sig: INJECT 1 PEN UNDER THE SKIN EVERY 14 DAYS    Dispense:  2 mL    Refill:  11    Patient Instructions  Medication  Instructions:  REFILLED Repatha *If you need a refill on your cardiac medications before your next appointment, please call your pharmacy*   Lab Work: CBC, CMET, Lipids prior to next visit If you have labs (blood work) drawn today and your tests are completely normal, you will receive your results only by: MyChart Message (if you have MyChart) OR A paper copy in the mail If you have any lab test that is abnormal or we need to change your treatment, we will call you to review the results.   Testing/Procedures: NONE   Follow-Up: At [facility], you and your health needs are our priority.  As part of our continuing mission to provide you with exceptional heart care, we have created designated Provider Care Teams.  These Care Teams include your primary Cardiologist (physician) and Advanced Practice Providers (APPs -  Physician Assistants and Nurse Practitioners) who all work together to provide you with the care you need, when you need it.  We recommend signing up for the patient portal called "MyChart".  Sign up information is provided on this After Visit Summary.  MyChart is used to connect with patients for Virtual Visits (Telemedicine).  Patients are able to view lab/test results, encounter notes, upcoming appointments, etc.  Non-urgent messages can be sent to your provider as well.   To learn more about what you can do with MyChart, go to [email or web address].    Your next appointment:   6 month(s)  Provider:   Tonny Bollman, MD        Signed, Tonny Bollman, MD  04/19/2023 12:45 PM    [facility]

## 2023-04-26 ENCOUNTER — Ambulatory Visit: Payer: Medicare Other

## 2023-05-06 ENCOUNTER — Ambulatory Visit (INDEPENDENT_AMBULATORY_CARE_PROVIDER_SITE_OTHER): Payer: Medicare Other

## 2023-05-06 DIAGNOSIS — L209 Atopic dermatitis, unspecified: Secondary | ICD-10-CM | POA: Diagnosis not present

## 2023-05-06 NOTE — Progress Notes (Signed)
Remote pacemaker transmission.   

## 2023-05-14 ENCOUNTER — Ambulatory Visit (HOSPITAL_COMMUNITY)
Admission: EM | Admit: 2023-05-14 | Discharge: 2023-05-14 | Disposition: A | Discharge status: Home / Self Care | Payer: Medicare Other | Attending: Physician Assistant | Admitting: Physician Assistant

## 2023-05-14 ENCOUNTER — Encounter (HOSPITAL_COMMUNITY): Payer: Self-pay

## 2023-05-14 ENCOUNTER — Ambulatory Visit (INDEPENDENT_AMBULATORY_CARE_PROVIDER_SITE_OTHER): Payer: Medicare Other

## 2023-05-14 DIAGNOSIS — J329 Chronic sinusitis, unspecified: Secondary | ICD-10-CM | POA: Diagnosis not present

## 2023-05-14 DIAGNOSIS — J40 Bronchitis, not specified as acute or chronic: Secondary | ICD-10-CM

## 2023-05-14 MED ORDER — DOXYCYCLINE HYCLATE 100 MG PO CAPS: 100.0000 mg | ORAL_CAPSULE | Freq: Two times a day (BID) | ORAL | 0 refills | Status: DC

## 2023-05-14 MED ORDER — IPRATROPIUM-ALBUTEROL 0.5-2.5 (3) MG/3ML IN SOLN
RESPIRATORY_TRACT | Status: AC
  Filled 2023-05-14: qty 3

## 2023-05-14 MED ORDER — AEROCHAMBER PLUS FLO-VU LARGE MISC: 1.0000 | Freq: Once | 0 refills | Status: AC

## 2023-05-14 MED ORDER — ALBUTEROL SULFATE HFA 108 (90 BASE) MCG/ACT IN AERS: 1.0000 | INHALATION_SPRAY | Freq: Four times a day (QID) | RESPIRATORY_TRACT | 0 refills | Status: DC | PRN

## 2023-05-14 MED ORDER — PREDNISONE 20 MG PO TABS: 40.0000 mg | ORAL_TABLET | Freq: Every day | ORAL | 0 refills | Status: AC

## 2023-05-14 MED ORDER — IPRATROPIUM-ALBUTEROL 0.5-2.5 (3) MG/3ML IN SOLN
3.0000 mL | Freq: Once | RESPIRATORY_TRACT | Status: AC
  Administered 2023-05-14: 3 mL via RESPIRATORY_TRACT

## 2023-05-14 NOTE — Discharge Instructions (Signed)
It was wonderful to meet you today.  Start doxycycline 100 mg twice daily for 10 days.  Stay out of the sun while on this medication.  Start prednisone 40 mg for 5 days.  Do not take NSAIDs with this medication.  Use albuterol every 4-6 hours as needed.  If your symptoms or not improving within a few days please return.  If you have any worsening symptoms including worsening cough, fever, shortness of breath you should be seen immediately.

## 2023-05-14 NOTE — ED Provider Notes (Signed)
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   Patient presents today with a 78-day history of URI symptoms.  Initially had clear congestion and sputum but over the past several days this has increased and viscosity and become discolored.  Overnight he had severe symptoms and was unable to sleep as result of the symptoms.  Denies any known sick contacts.  He did take a COVID test approximately 3 days ago that was negative.  He has never had COVID in the past but has had COVID-19 vaccinations.  He has tried over-the-counter medications including Coricidin and guaifenesin without improvement of symptoms.  Denies history of asthma, COPD, smoking.  He did have a few doses of prednisone as well as a few doses of ampicillin that he took when symptoms began but these have been ineffective.  He has not taken any courses of antibiotics or steroids in the past 90 days.  He denies any fever, chest pain, shortness of breath, nausea, vomiting.  Denies history of diabetes.  He was monitoring his oxygen saturation overnight and this would drop as low as 89% but improved following coughing fit.    Past Medical History:  Diagnosis Date   Atrial flutter -atypical 2007   ABLATION x2 [facility]   Atrial tachycardia 2007   ABLATION [facility]   Benign paroxysmal vertigo    CAD (coronary artery disease)    a. abnl mv 01/2012;  b. cath 05/23/2012 LCX 80@ bifurcation w/ OM, 95% @ AV groove, otw nonobs dzs, EF 55-65%;   Carcinoma (HCC)    skin cancer "off my chest"   Familial tremor    First degree AV block    had 4 ablasions that left py with heart block   History of blood transfusion 2010   "2nd day after prostatectomy"   Hyperlipidemia    Hypertension    Incontinence    DECREASING INCONTINENCE RELATED TO PROSTATECTOMY.   Migraines    Presence of permanent  cardiac pacemaker 2013 ?   Prostate cancer (HCC)    Pulsatile tinnitus    Tachy-brady syndrome (HCC)    a. developed after RFCA @ [facility] 2013-> MDT PPM;  b. s/p RA lead revision 05/2012    Patient Active Problem List   Diagnosis Date Noted   Junctional premature beats (HCC) 05/29/2022   Unstable angina (HCC) 12/13/2018   Family history of colon cancer    Rectal polyp    Cardiomyopathy, nonischemic (HCC) 06/17/2016   Malignant tumor of prostate (HCC) 05/11/2016   Dyspnea on exertion 11/25/2012   Hyponatremia 09/06/2012   CAD (coronary artery disease) 05/26/2012   Biventricular cardiac pacemaker in situ 04/15/2012   Benign positional vertigo 03/31/2012   First degree AV block 03/31/2012   Persistent atrial fibrillation (HCC) 03/31/2012   Sinus node dysfunction (HCC) 03/31/2012   Complete heart block (HCC)    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   Stress incontinence 11/02/2011   Hyperlipidemia    Pulsatile tinnitus    Atrial flutter/fibrillation    Familial tremor    Atrial tachycardia (HCC)    Hypertension     Past Surgical History:  Procedure Laterality Date   ATRIAL ABLATION SURGERY  04/01/12   "4th time I had one"   CARDIAC CATHETERIZATION  CARDIOVERSION  12/03/2011   Procedure: CARDIOVERSION;  Surgeon: Duke Salvia, MD;  Location: [facility] OR;  Service: Cardiovascular;  Laterality: [name];   CARDIOVERSION  01/13/2012   Procedure: CARDIOVERSION;  Surgeon: Lewayne Bunting, MD;  Location: [facility] OR;  Service: Cardiovascular;  Laterality: [name];   CARDIOVERSION  04/27/2012   Procedure: CARDIOVERSION;  Surgeon: Cassell Clement, MD;  Location: [facility] OR;  Service: Cardiovascular;  Laterality: [name];   COLONOSCOPY     COLONOSCOPY WITH PROPOFOL [name] 10/29/2016   Procedure: COLONOSCOPY WITH PROPOFOL;  Surgeon: Sherrilyn Rist, MD;  Location: [facility];  Service: Gastroenterology;  Laterality: [name];   CORONARY ANGIOPLASTY WITH STENT PLACEMENT  06/02/12   "1; first one ever"    CORONARY STENT INTERVENTION [name] 07/21/2017   Procedure: CORONARY STENT INTERVENTION;  Surgeon: Tonny Bollman, MD;  Location: [facility] INVASIVE CV LAB;  Service: Cardiovascular;  Laterality: [name];   CORONARY STENT INTERVENTION [name] 12/13/2018   Procedure: CORONARY STENT INTERVENTION;  Surgeon: Tonny Bollman, MD;  Location: [facility] INVASIVE CV LAB;  Service: Cardiovascular;  Laterality: [name];   EP IMPLANTABLE DEVICE [name] 06/17/2016   Procedure: BiV Pacemaker upgrade;  Surgeon: Duke Salvia, MD;  Location: [facility] INVASIVE CV LAB;  Service: Cardiovascular;  Laterality: [name];   HERNIA REPAIR  1980's   INGUINAL HERNIA REPAIR  1945   left side   INSERT / REPLACE / REMOVE PACEMAKER  03/2012   initial placement   INSERT / REPLACE / REMOVE PACEMAKER  05/25/12   "atrial lead change"   LEAD REVISION [name] 05/25/2012   Procedure: LEAD REVISION;  Surgeon: Marinus Maw, MD;  Location: [facility] CATH LAB;  Service: Cardiovascular;  Laterality: [name];   LEFT HEART CATH AND CORONARY ANGIOGRAPHY [name] 07/21/2017   Procedure: LEFT HEART CATH AND CORONARY ANGIOGRAPHY;  Surgeon: Tonny Bollman, MD;  Location: [facility] INVASIVE CV LAB;  Service: Cardiovascular;  Laterality: [name];   LEFT HEART CATH AND CORONARY ANGIOGRAPHY [name] 12/13/2018   Procedure: LEFT HEART CATH AND CORONARY ANGIOGRAPHY;  Surgeon: Tonny Bollman, MD;  Location: [facility] INVASIVE CV LAB;  Service: Cardiovascular;  Laterality: [name];   PERCUTANEOUS CORONARY STENT INTERVENTION (PCI-S) [name] 06/02/2012   Procedure: PERCUTANEOUS CORONARY STENT INTERVENTION (PCI-S);  Surgeon: Herby Abraham, MD;  Location: [facility] CATH LAB;  Service: Cardiovascular;  Laterality: [name];   PROSTATECTOMY  02/20/2009   COMPLICATED   SHOULDER SURGERY     Bilateral; "boney spurs removed"   SKIN CANCER EXCISION     "in situ; right chest"   TONSILLECTOMY AND ADENOIDECTOMY     "as a child"       Home Medications    Prior to Admission medications   Medication Sig Start Date End Date Taking? Authorizing Provider   albuterol (VENTOLIN HFA) 108 (90 Base) MCG/ACT inhaler Inhale 1-2 puffs into the lungs every 6 (six) hours as needed for wheezing or shortness of breath. 05/14/23  Yes Binnie Droessler K, PA-C  doxycycline (VIBRAMYCIN) 100 MG capsule Take 1 capsule (100 mg total) by mouth 2 (two) times daily. 05/14/23  Yes Nica Friske K, PA-C  predniSONE (DELTASONE) 20 MG tablet Take 2 tablets (40 mg total) by mouth daily for 5 days. 05/14/23 05/19/23 Yes Gerre Ranum, Noberto Retort, PA-C  Spacer/Aero-Holding Chambers (AEROCHAMBER PLUS FLO-VU LARGE) MISC 1 each by Other route once for 1 dose. 05/14/23 05/14/23 Yes Deatrice Spanbauer, Noberto Retort, PA-C  acyclovir (ZOVIRAX) 400 MG tablet Take 400 mg by mouth as needed. 07/01/20   [provider]  apixaban (ELIQUIS) 5 MG TABS tablet  Take 1 tablet (5 mg total) by mouth 2 (two) times daily. 11/15/18   Duke Salvia, MD  aspirin EC 81 MG tablet Take 1 tablet (81 mg total) by mouth daily. Swallow whole. 04/29/20   Tonny Bollman, MD  cetirizine (ZYRTEC) 10 MG tablet Take 1 tablet (10 mg total) by mouth 2 (two) times daily. 04/21/22   Kozlow, Alvira Philips, MD  Cholecalciferol 25 MCG (1000 UT) tablet TAKE ONE TABLET BY MOUTH DAILY FOR LOW VITAMIN D 11/05/21   [provider]  dupilumab (DUPIXENT) 300 MG/2ML prefilled syringe INJECT 300 MG UNDER THE SKIN EVERY 2 WEEKS 04/06/23   Kozlow, Alvira Philips, MD  EPINEPHrine 0.3 mg/0.3 mL IJ SOAJ injection Inject 0.3 mg into the muscle as needed. 04/21/22   Kozlow, Alvira Philips, MD  Evolocumab (REPATHA SURECLICK) 140 MG/ML SOAJ INJECT 1 PEN UNDER THE SKIN EVERY 14 DAYS 04/19/23   Tonny Bollman, MD  fluocinonide ointment (LIDEX) 0.05 % 1 application 1-7 times per week as needed. 04/06/23   Kozlow, Alvira Philips, MD  losartan (COZAAR) 25 MG tablet Take 25 mg by mouth daily.    [provider]  nitroGLYCERIN (NITROSTAT) 0.4 MG SL tablet Place 0.4 mg under the tongue every 5 (five) minutes as needed for chest pain (x 3 tabs).    [provider]  propranolol (INDERAL) 10  MG tablet 1 tablet 10/17/20   [provider]    Family History Family History  Problem Relation Age of Onset   Heart disease Father    Colon cancer Sister    Heart disease Brother        only one brother   Diabetes Brother        only one brother   Colon polyps Neg Hx    Rectal cancer Neg Hx    Stomach cancer Neg Hx    Esophageal cancer Neg Hx     Social History Social History   Tobacco Use   Smoking status: Never   Smokeless tobacco: Never  Vaping Use   Vaping Use: Never used  Substance Use Topics   Alcohol use: Yes    Alcohol/week: 3.0 standard drinks of alcohol    Types: 3 Standard drinks or equivalent per week    Comment: drinks alcohol at least once every 3 months 05/25/12 "last alcohol 02/2012"   Drug use: No     Allergies   Ace inhibitors, Amlodipine, Cefaclor, Chlorhexidine, Statins, and Xarelto [rivaroxaban]   Review of Systems Review of Systems  Constitutional:  Positive for activity change. Negative for appetite change, fatigue and fever.  HENT:  Positive for congestion. Negative for sinus pressure, sneezing and sore throat.   Respiratory:  Positive for cough, chest tightness and wheezing. Negative for shortness of breath.   Cardiovascular:  Negative for chest pain.  Gastrointestinal:  Negative for abdominal pain, diarrhea, nausea and vomiting.     Physical Exam Triage Vital Signs ED Triage Vitals  Enc Vitals Group     BP 05/14/23 0909 (!) 164/97     Pulse Rate 05/14/23 0909 75     Resp 05/14/23 0909 18     Temp 05/14/23 0909 99.8 F (37.7 C)     Temp Source 05/14/23 0909 Oral     SpO2 05/14/23 0909 94 %     Weight --      Height --      Head Circumference --      Peak Flow --      Pain Score 05/14/23  0912 0     Pain Loc --      Pain Edu? --      Excl. in GC? --    No data found.  Updated Vital Signs BP (!) 151/80 (BP Location: Right Arm)   Pulse 75   Temp 99.8 F (37.7 C) (Oral)   Resp 18   SpO2 94%   Visual  Acuity Right Eye Distance:   Left Eye Distance:   Bilateral Distance:    Right Eye Near:   Left Eye Near:    Bilateral Near:     Physical Exam Vitals reviewed.  Constitutional:      General: He is awake.     Appearance: Normal appearance. He is well-developed. He is not ill-appearing.     Comments: Very pleasant male appears stated age in no acute distress sitting comfortably in exam room  HENT:     Head: Normocephalic and atraumatic.     Right Ear: Tympanic membrane, ear canal and external ear normal. Tympanic membrane is not erythematous or bulging.     Left Ear: Tympanic membrane, ear canal and external ear normal. Tympanic membrane is not erythematous or bulging.     Nose: Nose normal.     Mouth/Throat:     Pharynx: Uvula midline. No oropharyngeal exudate or posterior oropharyngeal erythema.  Cardiovascular:     Rate and Rhythm: Normal rate and regular rhythm.     Heart sounds: Normal heart sounds, S1 normal and S2 normal. No murmur heard. Pulmonary:     Effort: Pulmonary effort is normal. No accessory muscle usage or respiratory distress.     Breath sounds: No stridor. Wheezing and rhonchi present. No rales.     Comments: Widespread wheezing and rhonchi. Neurological:     Mental Status: He is alert.  Psychiatric:        Behavior: Behavior is cooperative.      UC Treatments / Results  Labs (all labs ordered are listed, but only abnormal results are displayed) Labs Reviewed - No data to display  EKG   Radiology DG Chest 2 View  Result Date: 05/14/2023 CLINICAL DATA:  Worsening cough. EXAM: CHEST - 2 VIEW COMPARISON:  Chest radiographs 12/12/2018 FINDINGS: A 3 lead pacemaker remains in place. The cardiomediastinal silhouette is unchanged with the heart remains slightly enlarged. Aortic atherosclerosis is noted. There is mild left basilar scarring with chronic blunting of the left costophrenic angle. No acute airspace consolidation, edema, sizable pleural effusion, or  pneumothorax is identified. No acute osseous abnormality is seen. IMPRESSION: No active cardiopulmonary disease. Electronically Signed   By: Sebastian Ache M.D.   On: 05/14/2023 09:49    Procedures Procedures (including critical care time)  Medications Ordered in UC Medications  ipratropium-albuterol (DUONEB) 0.5-2.5 (3) MG/3ML nebulizer solution 3 mL (3 mLs Nebulization Given 05/14/23 0956)    Initial Impression / Assessment and Plan / UC Course  I have reviewed the triage vital signs and the nursing notes.  Pertinent labs & imaging results that were available during my care of the patient were reviewed by me and considered in my medical decision making (see chart for details).     Patient is well-appearing, afebrile, nontoxic, nontachycardic.  He did have wheezing on exam and this improved but did not resolve with including DuoNeb.  He was sent home with prescription for albuterol inhaler and spacer with instruction to use this every 4-6 hours as needed for shortness of breath and coughing fits.  Chest x-ray was obtained that  showed no acute cardiopulmonary disease but did show some scarring in the left lower lung.  Will treat with doxycycline 100 mg twice daily for 10 days and he was instructed to avoid prolonged sun exposure while on this medication due to associated photosensitivity.  Will also start prednisone burst of 40 mg for 5 days.  Discussed that he is not to take NSAIDs with this medication due to risk of GI bleeding.  He can use over-the-counter medication.  If his symptoms or not improving within a few days he is to return for reevaluation.  We discussed that if he has any worsening symptoms including worsening cough, fever, shortness of breath, chest pain he should be seen in the emergency room.  He does have a pulse oximeter at home and was encouraged to monitor his oxygen saturation and be evaluated if this is not improving to his baseline with current medication regimen.    Final  Clinical Impressions(s) / UC Diagnoses   Final diagnoses:  Sinobronchitis     Discharge Instructions      It was wonderful to meet you today.  Start doxycycline 100 mg twice daily for 10 days.  Stay out of the sun while on this medication.  Start prednisone 40 mg for 5 days.  Do not take NSAIDs with this medication.  Use albuterol every 4-6 hours as needed.  If your symptoms or not improving within a few days please return.  If you have any worsening symptoms including worsening cough, fever, shortness of breath you should be seen immediately.     ED Prescriptions     Medication Sig Dispense Auth. Provider   doxycycline (VIBRAMYCIN) 100 MG capsule Take 1 capsule (100 mg total) by mouth 2 (two) times daily. 20 capsule Ahava Kissoon K, PA-C   predniSONE (DELTASONE) 20 MG tablet Take 2 tablets (40 mg total) by mouth daily for 5 days. 10 tablet Maricela Kawahara K, PA-C   Spacer/Aero-Holding Chambers (AEROCHAMBER PLUS FLO-VU LARGE) MISC 1 each by Other route once for 1 dose. 1 each Kyani Simkin K, PA-C   albuterol (VENTOLIN HFA) 108 (90 Base) MCG/ACT inhaler Inhale 1-2 puffs into the lungs every 6 (six) hours as needed for wheezing or shortness of breath. 18 g Ronika Kelson K, PA-C      PDMP not reviewed this encounter.   Jeani Hawking, PA-C 05/14/23 1103

## 2023-05-14 NOTE — ED Triage Notes (Signed)
Patient reports that he has had a productive cough with green sputum x 7-8 days. Patient states cough is worse when he lies down. Patient states, he has had "rough breathing and squeaking sounds."  Patient states he has been taking Coricidin with relief.

## 2023-05-27 ENCOUNTER — Ambulatory Visit: Payer: Medicare Other

## 2023-06-02 ENCOUNTER — Ambulatory Visit: Payer: Medicare Other

## 2023-06-21 ENCOUNTER — Telehealth: Payer: Self-pay

## 2023-06-21 ENCOUNTER — Other Ambulatory Visit (HOSPITAL_COMMUNITY): Payer: Self-pay

## 2023-06-21 ENCOUNTER — Telehealth: Payer: Self-pay | Admitting: Cardiovascular Disease

## 2023-06-21 NOTE — Telephone Encounter (Signed)
Pharmacy Patient Advocate Encounter   Received notification from Physician's Office that prior authorization for REPATHA is required/requested.   Insurance verification completed.   The patient is insured through [employer]  .   Per test claim: PA required; PA submitted to [id number] via CoverMyMeds Key/confirmation #/EOC [id number] Status is pending

## 2023-06-21 NOTE — Telephone Encounter (Signed)
Pt needs to talk with a nurse about getting his insurance to reauthorize his medication. Please advise.

## 2023-06-21 NOTE — Telephone Encounter (Signed)
Returned call to patient who states that when he tried to refill his Repatha prescription this morning, he was told that his prior authorization on it has expired. He will need his next dose in 2 weeks. Forwarding to pharmD team & prior auth dept to make aware.

## 2023-06-21 NOTE — Telephone Encounter (Signed)
PA request has been Submitted. New Encounter created for follow up. For additional info see Pharmacy Prior Auth telephone encounter from 06/21/23.

## 2023-06-24 NOTE — Telephone Encounter (Signed)
Clinical Questions submitted

## 2023-06-25 ENCOUNTER — Other Ambulatory Visit (HOSPITAL_COMMUNITY): Payer: Self-pay

## 2023-06-25 NOTE — Telephone Encounter (Signed)
[name] from [facility] [phone] option 5) called to inform office that they've APPROVED the PA for REPATHA.

## 2023-06-25 NOTE — Telephone Encounter (Signed)
Pharmacy Patient Advocate Encounter   Received notification from CoverMyMeds that prior authorization for Repatha SureClick 140MG /ML auto-injectors is required/requested.   Insurance verification completed.   The patient is insured through [facility] .   Per test claim: APPROVED from 06/25/2023 to 06/23/2024

## 2023-07-14 ENCOUNTER — Ambulatory Visit (INDEPENDENT_AMBULATORY_CARE_PROVIDER_SITE_OTHER): Payer: Medicare Other

## 2023-07-14 DIAGNOSIS — I428 Other cardiomyopathies: Secondary | ICD-10-CM

## 2023-07-14 LAB — CUP PACEART REMOTE DEVICE CHECK
Battery Remaining Longevity: 19 mo
Battery Voltage: 2.84 V
Brady Statistic AP VP Percent: 91.78 %
Brady Statistic AP VS Percent: 0.02 %
Brady Statistic AS VP Percent: 8 %
Brady Statistic AS VS Percent: 0.18 %
Brady Statistic RA Percent Paced: 27.49 %
Brady Statistic RV Percent Paced: 99.55 %
Date Time Interrogation Session: 20240828042605
Implantable Lead Connection Status: 753985
Implantable Lead Connection Status: 753985
Implantable Lead Connection Status: 753985
Implantable Lead Implant Date: 20130517
Implantable Lead Implant Date: 20130710
Implantable Lead Implant Date: 20170802
Implantable Lead Location: 753858
Implantable Lead Location: 753859
Implantable Lead Location: 753860
Implantable Pulse Generator Implant Date: 20170802
Lead Channel Impedance Value: 1064 Ohm
Lead Channel Impedance Value: 1102 Ohm
Lead Channel Impedance Value: 1121 Ohm
Lead Channel Impedance Value: 342 Ohm
Lead Channel Impedance Value: 361 Ohm
Lead Channel Impedance Value: 418 Ohm
Lead Channel Impedance Value: 437 Ohm
Lead Channel Impedance Value: 437 Ohm
Lead Channel Impedance Value: 456 Ohm
Lead Channel Impedance Value: 494 Ohm
Lead Channel Impedance Value: 722 Ohm
Lead Channel Impedance Value: 760 Ohm
Lead Channel Impedance Value: 817 Ohm
Lead Channel Impedance Value: 836 Ohm
Lead Channel Pacing Threshold Amplitude: 0.75 V
Lead Channel Pacing Threshold Amplitude: 1.125 V
Lead Channel Pacing Threshold Amplitude: 1.375 V
Lead Channel Pacing Threshold Pulse Width: 0.4 ms
Lead Channel Pacing Threshold Pulse Width: 0.4 ms
Lead Channel Pacing Threshold Pulse Width: 0.8 ms
Lead Channel Sensing Intrinsic Amplitude: 1 mV
Lead Channel Sensing Intrinsic Amplitude: 1 mV
Lead Channel Sensing Intrinsic Amplitude: 14.25 mV
Lead Channel Sensing Intrinsic Amplitude: 14.25 mV
Lead Channel Setting Pacing Amplitude: 2 V
Lead Channel Setting Pacing Amplitude: 2.25 V
Lead Channel Setting Pacing Amplitude: 2.5 V
Lead Channel Setting Pacing Pulse Width: 0.4 ms
Lead Channel Setting Pacing Pulse Width: 1 ms
Lead Channel Setting Sensing Sensitivity: 4 mV
Zone Setting Status: 755011
Zone Setting Status: 755011

## 2023-07-23 NOTE — Progress Notes (Signed)
Remote pacemaker transmission.   

## 2023-08-27 ENCOUNTER — Telehealth: Payer: Self-pay | Admitting: Cardiovascular Disease

## 2023-08-27 NOTE — Telephone Encounter (Signed)
Patient called to talk with Dr. Excell Seltzer or nurse and wanted to let them know that he is about to start a medication that will increase his lipids. Please call back to confirm

## 2023-08-27 NOTE — Telephone Encounter (Signed)
Returned call to patient who states he is going to start Rinvoq and was told it will increase his lipids. He's scheduled for labs in January and wanted to make [facility] aware so he would know why they had jumped.

## 2023-09-22 ENCOUNTER — Telehealth: Payer: Self-pay | Admitting: Internal Medicine

## 2023-09-22 NOTE — Telephone Encounter (Signed)
Patient is asking that the nurse gives him a call. Please advise

## 2023-09-22 NOTE — Telephone Encounter (Signed)
Spoke with pt who complains of increasing exercise intolerance as well as mild lower extremity edema.  Pt denies CP, SOB or dizziness.  Appointment scheduled with Otilio Saber, PA-C on 10/07/2023 at 9am.  Pt verbalizes understanding and agrees with current plan.

## 2023-10-06 NOTE — H&P (View-Only) (Signed)
  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 He has also had decreased exercise tolerance and mid sternal chest pain walking up hills, that relieves with rest, even with less exertion such as walking down the backside of the hill.  Denies syncope or chest pain at rest.   Review of systems complete and found to be negative unless listed in HPI.   EP Information / Studies Reviewed:    EKG is ordered today. Personal review as below.  EKG Interpretation Date/Time:  Thursday October 07 2023 09:06:51 EST Ventricular Rate:  79 PR Interval:  128 QRS Duration:  136 QT Interval:  412 QTC Calculation: 472 R Axis:   134  Text Interpretation: AV dual-paced rhythm Biventricular pacemaker detected When compared with ECG of 15-Nov-2021 08:12, PREVIOUS ECG IS PRESENT Confirmed by Maxine Glenn [phone]) on 10/07/2023 9:33:20 AM    PPM Interrogation-  reviewed in detail today,  See PACEART report.  Device History Medtronic CRT Device -> Implanted Dual chamber 2013, upgrade 2017- with St Jude lead NOT MRI safe  DATE TEST EF    1/14    ECHO   55-60 %    1/17    MYOVIEW    47 % No  ischemia  5/17 Echo  40-45%    4/18 Echo  40-45% Mild LVH  9/18 Cath   60-65% LAD 95%>>DES; Lcx Stent Patent  1/20 Echo  45-50% 15/60mm  1/20 LHC 45-50% LADm 95%>>0 stented  6/23 Echo  50-55% ASH mod 16/10 mm     Physical Exam:   VS:  BP 138/88   Pulse 79   Ht 6' (1.829 m)   Wt 178 lb (80.7 kg)   SpO2 98%   BMI 24.14 kg/m    Wt Readings from Last 3  Encounters:  10/07/23 178 lb (80.7 kg)  04/19/23 179 lb 3.2 oz (81.3 kg)  04/06/23 179 lb 11.2 oz (81.5 kg)     GEN: Well nourished, well developed in no acute distress NECK: No JVD; No carotid bruits CARDIAC: Regular rate and rhythm, no murmurs, rubs, gallops RESPIRATORY:  Clear to auscultation without rales, wheezing or rhonchi  ABDOMEN: Soft, non-tender, non-distended EXTREMITIES:  No edema; No deformity   ASSESSMENT AND PLAN:    CHB s/p Medtronic PPM  SND Normal PPM function See Pace Art report No changes today If cath unremarkable, would plan to make rate resposne more aggressive.   AF/AFL With history of ablations distantly at [facility]  PVCs/PJCs/PACs EKG today without ectopy.  CAD s/p stenting in 2013 and 2020 Stable angina His symptoms and presentation are concerning for again worsening CAD.  Will give NTG refill and schedule for LHC, next available with Dr. Excell Seltzer. Alarm symptoms reviewed.   HTN Stable on current regimen   Disposition:   Follow up with EP APP s/p cath.   Signed, Graciella Freer, PA-C

## 2023-10-06 NOTE — Progress Notes (Unsigned)
  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 male with h/o PAF s/p PVI x 2 at [facility], CHB s/p CRTP, CAD with prior stenting, SND, PJC/PVCs, and HTN  seen today for acute visit due to mild peripheral edema and decreased exercise tolerance  Patient reports ***.  he denies chest pain, palpitations, dyspnea, PND, orthopnea, nausea, vomiting, dizziness, syncope, edema, weight gain, or early satiety.   Review of systems complete and found to be negative unless listed in HPI.   EP Information / Studies Reviewed:    EKG is ordered today. Personal review as below.       PPM Interrogation-  reviewed in detail today,  See PACEART report.  Device History Medtronic CRT Device -> Implanted Dual chamber 2013, upgrade 2017- with St Jude lead NOT MRI safe  DATE TEST EF    1/14    ECHO   55-60 %    1/17    MYOVIEW    47 % No  ischemia  5/17 Echo  40-45%    4/18 Echo  40-45% Mild LVH  9/18 Cath   60-65% LAD 95%>>DES; Lcx Stent Patent  1/20 Echo  45-50% 15/8mm  1/20 LHC 45-50% LADm 95%>>0 stented  6/23 Echo  50-55% ASH mod 16/10 mm     Physical Exam:   VS:  There were no vitals taken for this visit.   Wt Readings from Last 3 Encounters:  04/19/23 179 lb 3.2 oz (81.3 kg)  04/06/23 179 lb 11.2 oz (81.5 kg)  10/16/22 181 lb (82.1 kg)     GEN: Well nourished, well developed in no acute distress NECK: No JVD; No carotid bruits CARDIAC: {EPRHYTHM:28826}, no murmurs, rubs, gallops RESPIRATORY:  Clear to auscultation without rales, wheezing or rhonchi  ABDOMEN: Soft, non-tender, non-distended EXTREMITIES:  No edema; No deformity   ASSESSMENT AND PLAN:    CHB s/p Medtronic PPM  SND Normal PPM function See Pace Art report No changes  today  AF/AFL With history of ablations distantly at [facility]  PVCs/PJCs/PACs EKG today ***  CAD s/p stenting in 2013 and 2020 ***   HTN Stable on current regimen   {Click here to Review PMH, Prob List, Meds, Allergies, SHx, FHx  :1}   Disposition:   Follow up with {EPPROVIDERS:28135} {EPFOLLOW UP:28173}  Signed, Graciella Freer, PA-C

## 2023-10-07 ENCOUNTER — Telehealth: Payer: Self-pay | Admitting: Cardiovascular Disease

## 2023-10-07 ENCOUNTER — Encounter: Payer: Self-pay | Admitting: Student

## 2023-10-07 ENCOUNTER — Ambulatory Visit: Payer: Medicare Other | Attending: Student | Admitting: Student

## 2023-10-07 VITALS — BP 138/88 | HR 79 | Ht 72.0 in | Wt 178.0 lb

## 2023-10-07 DIAGNOSIS — I442 Atrioventricular block, complete: Secondary | ICD-10-CM | POA: Diagnosis present

## 2023-10-07 DIAGNOSIS — I495 Sick sinus syndrome: Secondary | ICD-10-CM | POA: Insufficient documentation

## 2023-10-07 DIAGNOSIS — I428 Other cardiomyopathies: Secondary | ICD-10-CM | POA: Diagnosis not present

## 2023-10-07 DIAGNOSIS — I4819 Other persistent atrial fibrillation: Secondary | ICD-10-CM | POA: Insufficient documentation

## 2023-10-07 DIAGNOSIS — E782 Mixed hyperlipidemia: Secondary | ICD-10-CM | POA: Diagnosis present

## 2023-10-07 DIAGNOSIS — I25118 Atherosclerotic heart disease of native coronary artery with other forms of angina pectoris: Secondary | ICD-10-CM | POA: Diagnosis present

## 2023-10-07 LAB — CUP PACEART INCLINIC DEVICE CHECK
Battery Remaining Longevity: 14 mo
Battery Voltage: 2.81 V
Brady Statistic AP VP Percent: 67.36 %
Brady Statistic AP VS Percent: 0.04 %
Brady Statistic AS VP Percent: 31.84 %
Brady Statistic AS VS Percent: 0.68 %
Brady Statistic RA Percent Paced: 36.92 %
Brady Statistic RV Percent Paced: 99.24 %
Date Time Interrogation Session: 20241121131415
Implantable Lead Connection Status: 753985
Implantable Lead Connection Status: 753985
Implantable Lead Connection Status: 753985
Implantable Lead Implant Date: 20130517
Implantable Lead Implant Date: 20130710
Implantable Lead Implant Date: 20170802
Implantable Lead Location: 753858
Implantable Lead Location: 753859
Implantable Lead Location: 753860
Implantable Pulse Generator Implant Date: 20170802
Lead Channel Impedance Value: 1064 Ohm
Lead Channel Impedance Value: 1083 Ohm
Lead Channel Impedance Value: 1121 Ohm
Lead Channel Impedance Value: 380 Ohm
Lead Channel Impedance Value: 399 Ohm
Lead Channel Impedance Value: 418 Ohm
Lead Channel Impedance Value: 456 Ohm
Lead Channel Impedance Value: 475 Ohm
Lead Channel Impedance Value: 494 Ohm
Lead Channel Impedance Value: 551 Ohm
Lead Channel Impedance Value: 722 Ohm
Lead Channel Impedance Value: 779 Ohm
Lead Channel Impedance Value: 874 Ohm
Lead Channel Impedance Value: 893 Ohm
Lead Channel Pacing Threshold Amplitude: 0.75 V
Lead Channel Pacing Threshold Amplitude: 1.125 V
Lead Channel Pacing Threshold Amplitude: 1.375 V
Lead Channel Pacing Threshold Pulse Width: 0.4 ms
Lead Channel Pacing Threshold Pulse Width: 0.4 ms
Lead Channel Pacing Threshold Pulse Width: 0.8 ms
Lead Channel Sensing Intrinsic Amplitude: 1.125 mV
Lead Channel Sensing Intrinsic Amplitude: 1.125 mV
Lead Channel Sensing Intrinsic Amplitude: 14.25 mV
Lead Channel Sensing Intrinsic Amplitude: 14.25 mV
Lead Channel Setting Pacing Amplitude: 2 V
Lead Channel Setting Pacing Amplitude: 2.25 V
Lead Channel Setting Pacing Amplitude: 2.5 V
Lead Channel Setting Pacing Pulse Width: 0.4 ms
Lead Channel Setting Pacing Pulse Width: 1 ms
Lead Channel Setting Sensing Sensitivity: 4 mV
Zone Setting Status: 755011
Zone Setting Status: 755011

## 2023-10-07 MED ORDER — NITROGLYCERIN 0.4 MG SL SUBL: 0.4000 mg | SUBLINGUAL_TABLET | SUBLINGUAL | 3 refills | Status: AC | PRN

## 2023-10-07 NOTE — Telephone Encounter (Signed)
Patient is requesting to speak with Dr. Excell Seltzer or nurse. Please call back to discuss

## 2023-10-07 NOTE — Telephone Encounter (Signed)
Pt would like cath to be by Dr. Excell Seltzer. Discussed with Otilio Saber, PA who saw pt today and scheduled this procedure.  OK for pt to wait and have done by Dr. Excell Seltzer on 12/5. Rescheduled to that date. Pt made aware and that time to arrive on 12/5 is 10:30 am, all other procedure instructions remain the same. Pt very grateful for all the help with this. Forwarding to MD for his FYI.

## 2023-10-07 NOTE — Patient Instructions (Signed)
Medication Instructions:  Your physician recommends that you continue on your current medications as directed. Please refer to the Current Medication list given to you today.  *If you need a refill on your cardiac medications before your next appointment, please call your pharmacy*  Lab Work: BMET, CBC-TODAY If you have labs (blood work) drawn today and your tests are completely normal, you will receive your results only by: MyChart Message (if you have MyChart) OR A paper copy in the mail If you have any lab test that is abnormal or we need to change your treatment, we will call you to review the results.   Testing/Procedures:       Cardiac/Peripheral Catheterization   You are scheduled for a Cardiac Catheterization on Tuesday, November 26 with Dr.  Jacinto Halim .  1. Please arrive at the [facility] (Main Entrance A) at [facility]: [address] [place], [place] [address] at 5:30 AM (This time is 2 hour(s) before your procedure to ensure your preparation).   Free valet parking service is available. You will check in at ADMITTING. The support person will be asked to wait in the waiting room.  It is OK to have someone drop you off and come back when you are ready to be discharged.        Special note: Every effort is made to have your procedure done on time. Please understand that emergencies sometimes delay scheduled procedures.  2. Diet: Do not eat solid foods after midnight.  You may have clear liquids until 5 AM the day of the procedure.  3. Labs: You will need to have blood drawn on TODAY  4. Medication instructions in preparation for your procedure:   Contrast Allergy: No   Stop taking Eliquis (Apixiban) for 2 days prior.  On the morning of your procedure, take Aspirin 81 mg and any morning medicines NOT listed above.  You may use sips of water.  5. Plan to go home the same day, you will only stay overnight if medically necessary. 6. You MUST have a responsible  adult to drive you home. 7. An adult MUST be with you the first 24 hours after you arrive home. 8. Bring a current list of your medications, and the last time and date medication taken. 9. Bring ID and current insurance cards. 10.Please wear clothes that are easy to get on and off and wear slip-on shoes.  Thank you for allowing [place] to care for you!   -- [facility]    Follow-Up: At [facility], you and your health needs are our priority.  As part of our continuing mission to provide you with exceptional heart care, we have created designated Provider Care Teams.  These Care Teams include your primary Cardiologist (physician) and Advanced Practice Providers (APPs -  Physician Assistants and Nurse Practitioners) who all work together to provide you with the care you need, when you need it.  Your next appointment:   2-3 weeks  Provider:   Casimiro Needle "Otilio Saber, PA-C

## 2023-10-08 LAB — BASIC METABOLIC PANEL
BUN/Creatinine Ratio: 19 (ref 10–24)
BUN: 18 mg/dL (ref 8–27)
CO2: 25 mmol/L (ref 20–29)
Calcium: 9.3 mg/dL (ref 8.6–10.2)
Chloride: 97 mmol/L (ref 96–106)
Creatinine, Ser: 0.97 mg/dL (ref 0.76–1.27)
Glucose: 90 mg/dL (ref 70–99)
Potassium: 5.1 mmol/L (ref 3.5–5.2)
Sodium: 133 mmol/L — ABNORMAL LOW (ref 134–144)
eGFR: 79 mL/min/{1.73_m2} (ref >59)

## 2023-10-08 LAB — CBC
Hematocrit: 40.7 % (ref 37.5–51.0)
Hemoglobin: 13.7 g/dL (ref 13.0–17.7)
MCH: 30.9 pg (ref 26.6–33.0)
MCHC: 33.7 g/dL (ref 31.5–35.7)
MCV: 92 fL (ref 79–97)
Platelets: 183 10*3/uL (ref 150–450)
RBC: 4.44 x10E6/uL (ref 4.14–5.80)
RDW: 12.2 % (ref 11.6–15.4)
WBC: 4.5 10*3/uL (ref 3.4–10.8)

## 2023-10-13 ENCOUNTER — Ambulatory Visit: Payer: Medicare Other

## 2023-10-13 DIAGNOSIS — I428 Other cardiomyopathies: Secondary | ICD-10-CM

## 2023-10-13 LAB — CUP PACEART REMOTE DEVICE CHECK
Battery Remaining Longevity: 13 mo
Battery Voltage: 2.8 V
Brady Statistic AP VP Percent: 97.99 %
Brady Statistic AP VS Percent: 0.01 %
Brady Statistic AS VP Percent: 0.77 %
Brady Statistic AS VS Percent: 1.22 %
Brady Statistic RA Percent Paced: 61.42 %
Brady Statistic RV Percent Paced: 98.92 %
Date Time Interrogation Session: 20241127043334
Implantable Lead Connection Status: 753985
Implantable Lead Connection Status: 753985
Implantable Lead Connection Status: 753985
Implantable Lead Implant Date: 20130517
Implantable Lead Implant Date: 20130710
Implantable Lead Implant Date: 20170802
Implantable Lead Location: 753858
Implantable Lead Location: 753859
Implantable Lead Location: 753860
Implantable Pulse Generator Implant Date: 20170802
Lead Channel Impedance Value: 1026 Ohm
Lead Channel Impedance Value: 1064 Ohm
Lead Channel Impedance Value: 1102 Ohm
Lead Channel Impedance Value: 342 Ohm
Lead Channel Impedance Value: 361 Ohm
Lead Channel Impedance Value: 418 Ohm
Lead Channel Impedance Value: 437 Ohm
Lead Channel Impedance Value: 456 Ohm
Lead Channel Impedance Value: 494 Ohm
Lead Channel Impedance Value: 532 Ohm
Lead Channel Impedance Value: 665 Ohm
Lead Channel Impedance Value: 817 Ohm
Lead Channel Impedance Value: 893 Ohm
Lead Channel Impedance Value: 893 Ohm
Lead Channel Pacing Threshold Amplitude: 0.75 V
Lead Channel Pacing Threshold Amplitude: 1.125 V
Lead Channel Pacing Threshold Amplitude: 1.375 V
Lead Channel Pacing Threshold Pulse Width: 0.4 ms
Lead Channel Pacing Threshold Pulse Width: 0.4 ms
Lead Channel Pacing Threshold Pulse Width: 0.8 ms
Lead Channel Sensing Intrinsic Amplitude: 1 mV
Lead Channel Sensing Intrinsic Amplitude: 1 mV
Lead Channel Sensing Intrinsic Amplitude: 14.25 mV
Lead Channel Sensing Intrinsic Amplitude: 14.25 mV
Lead Channel Setting Pacing Amplitude: 2 V
Lead Channel Setting Pacing Amplitude: 2.25 V
Lead Channel Setting Pacing Amplitude: 2.5 V
Lead Channel Setting Pacing Pulse Width: 0.4 ms
Lead Channel Setting Pacing Pulse Width: 1 ms
Lead Channel Setting Sensing Sensitivity: 4 mV
Zone Setting Status: 755011
Zone Setting Status: 755011

## 2023-10-20 ENCOUNTER — Other Ambulatory Visit: Payer: Medicare Other

## 2023-10-20 ENCOUNTER — Telehealth: Payer: Self-pay | Admitting: *Deleted

## 2023-10-20 NOTE — Telephone Encounter (Addendum)
Cardiac Catheterization scheduled at [facility] for: Thursday October 21, 2023 12:30 PM Arrival time [facility] Main Entrance A at: 10:30 AM  Nothing to eat after midnight prior to procedure, clear liquids until 5 AM day of procedure  Medication instructions: -Hold:  Eliquis-none 10/19/23 until post procedure -Other usual morning medications can be taken with sips of water including aspirin 81 mg.  Plan to go home the same day, you will only stay overnight if medically necessary.  You must have responsible adult to drive you home.  Someone must be with you the first 24 hours after you arrive home.  Reviewed procedure instructions with patient.

## 2023-10-21 ENCOUNTER — Ambulatory Visit (HOSPITAL_COMMUNITY)
Admission: RE | Admit: 2023-10-21 | Discharge: 2023-10-21 | Disposition: A | Discharge status: Home / Self Care | Payer: Medicare Other | Attending: Cardiovascular Disease | Admitting: Cardiovascular Disease

## 2023-10-21 ENCOUNTER — Encounter (HOSPITAL_COMMUNITY)
Admission: RE | Disposition: A | Discharge status: Home / Self Care | Payer: Self-pay | Source: Home / Self Care | Attending: Cardiovascular Disease

## 2023-10-21 DIAGNOSIS — Z950 Presence of cardiac pacemaker: Secondary | ICD-10-CM | POA: Diagnosis not present

## 2023-10-21 DIAGNOSIS — I25119 Atherosclerotic heart disease of native coronary artery with unspecified angina pectoris: Secondary | ICD-10-CM | POA: Diagnosis not present

## 2023-10-21 DIAGNOSIS — Z79899 Other long term (current) drug therapy: Secondary | ICD-10-CM | POA: Diagnosis not present

## 2023-10-21 DIAGNOSIS — I2582 Chronic total occlusion of coronary artery: Secondary | ICD-10-CM | POA: Diagnosis not present

## 2023-10-21 DIAGNOSIS — Z955 Presence of coronary angioplasty implant and graft: Secondary | ICD-10-CM | POA: Diagnosis not present

## 2023-10-21 DIAGNOSIS — I10 Essential (primary) hypertension: Secondary | ICD-10-CM | POA: Insufficient documentation

## 2023-10-21 DIAGNOSIS — I442 Atrioventricular block, complete: Secondary | ICD-10-CM | POA: Diagnosis not present

## 2023-10-21 DIAGNOSIS — I495 Sick sinus syndrome: Secondary | ICD-10-CM | POA: Insufficient documentation

## 2023-10-21 DIAGNOSIS — I480 Paroxysmal atrial fibrillation: Secondary | ICD-10-CM | POA: Insufficient documentation

## 2023-10-21 DIAGNOSIS — R600 Localized edema: Secondary | ICD-10-CM | POA: Diagnosis not present

## 2023-10-21 DIAGNOSIS — I493 Ventricular premature depolarization: Secondary | ICD-10-CM | POA: Diagnosis not present

## 2023-10-21 HISTORY — PX: LEFT HEART CATH AND CORONARY ANGIOGRAPHY: CATH118249

## 2023-10-21 SURGERY — LEFT HEART CATH AND CORONARY ANGIOGRAPHY
Anesthesia: LOCAL

## 2023-10-21 MED ORDER — ONDANSETRON HCL 4 MG/2ML IJ SOLN: 4.0000 mg | Freq: Four times a day (QID) | INTRAMUSCULAR | Status: DC | PRN

## 2023-10-21 MED ORDER — FENTANYL CITRATE (PF) 100 MCG/2ML IJ SOLN
INTRAMUSCULAR | Status: AC
  Filled 2023-10-21: qty 2

## 2023-10-21 MED ORDER — LIDOCAINE HCL (PF) 1 % IJ SOLN
INTRAMUSCULAR | Status: DC | PRN
  Administered 2023-10-21: 2 mL

## 2023-10-21 MED ORDER — SODIUM CHLORIDE 0.9 % WEIGHT BASED INFUSION
3.0000 mL/kg/h | INTRAVENOUS | Status: AC
  Administered 2023-10-21: 3 mL/kg/h via INTRAVENOUS

## 2023-10-21 MED ORDER — FENTANYL CITRATE (PF) 100 MCG/2ML IJ SOLN
INTRAMUSCULAR | Status: DC | PRN
  Administered 2023-10-21: 25 ug via INTRAVENOUS

## 2023-10-21 MED ORDER — ACETAMINOPHEN 325 MG PO TABS: 650.0000 mg | ORAL_TABLET | ORAL | Status: DC | PRN

## 2023-10-21 MED ORDER — IOHEXOL 350 MG/ML SOLN
INTRAVENOUS | Status: DC | PRN
  Administered 2023-10-21: 50 mL

## 2023-10-21 MED ORDER — HEPARIN (PORCINE) IN NACL 1000-0.9 UT/500ML-% IV SOLN
INTRAVENOUS | Status: DC | PRN
  Administered 2023-10-21 (×2): 500 mL

## 2023-10-21 MED ORDER — ASPIRIN 81 MG PO CHEW: 81.0000 mg | CHEWABLE_TABLET | ORAL | Status: DC

## 2023-10-21 MED ORDER — LIDOCAINE HCL (PF) 1 % IJ SOLN
INTRAMUSCULAR | Status: AC
  Filled 2023-10-21: qty 30

## 2023-10-21 MED ORDER — VERAPAMIL HCL 2.5 MG/ML IV SOLN
INTRAVENOUS | Status: AC
  Filled 2023-10-21: qty 2

## 2023-10-21 MED ORDER — HYDRALAZINE HCL 20 MG/ML IJ SOLN: 10.0000 mg | INTRAMUSCULAR | Status: DC | PRN

## 2023-10-21 MED ORDER — HEPARIN SODIUM (PORCINE) 1000 UNIT/ML IJ SOLN
INTRAMUSCULAR | Status: DC | PRN
  Administered 2023-10-21: 5000 [IU] via INTRAVENOUS

## 2023-10-21 MED ORDER — VERAPAMIL HCL 2.5 MG/ML IV SOLN
INTRAVENOUS | Status: DC | PRN
  Administered 2023-10-21: 10 mL via INTRA_ARTERIAL

## 2023-10-21 MED ORDER — SODIUM CHLORIDE 0.9 % IV SOLN: 250.0000 mL | INTRAVENOUS | Status: DC | PRN

## 2023-10-21 MED ORDER — SODIUM CHLORIDE 0.9% FLUSH
3.0000 mL | Freq: Two times a day (BID) | INTRAVENOUS | Status: DC
Start: 2023-10-21 — End: 2023-10-21

## 2023-10-21 MED ORDER — MIDAZOLAM HCL 2 MG/2ML IJ SOLN
INTRAMUSCULAR | Status: DC | PRN
  Administered 2023-10-21: 2 mg via INTRAVENOUS

## 2023-10-21 MED ORDER — LABETALOL HCL 5 MG/ML IV SOLN: 10.0000 mg | INTRAVENOUS | Status: DC | PRN

## 2023-10-21 MED ORDER — SODIUM CHLORIDE 0.9 % WEIGHT BASED INFUSION: 1.0000 mL/kg/h | INTRAVENOUS | Status: DC

## 2023-10-21 MED ORDER — SODIUM CHLORIDE 0.9% FLUSH: 3.0000 mL | INTRAVENOUS | Status: DC | PRN

## 2023-10-21 MED ORDER — HEPARIN SODIUM (PORCINE) 1000 UNIT/ML IJ SOLN
INTRAMUSCULAR | Status: AC
  Filled 2023-10-21: qty 10

## 2023-10-21 MED ORDER — MIDAZOLAM HCL 2 MG/2ML IJ SOLN
INTRAMUSCULAR | Status: AC
Start: 2023-10-21 — End: ?
  Filled 2023-10-21: qty 2

## 2023-10-21 SURGICAL SUPPLY — 7 items
CATH 5FR JL3.5 JR4 ANG PIG MP (CATHETERS) IMPLANT
DEVICE RAD COMP TR BAND LRG (VASCULAR PRODUCTS) IMPLANT
GLIDESHEATH SLEND SS 6F .021 (SHEATH) IMPLANT
GUIDEWIRE INQWIRE 1.5J.035X260 (WIRE) IMPLANT
INQWIRE 1.5J .035X260CM (WIRE) ×1
PACK CARDIAC CATHETERIZATION (CUSTOM PROCEDURE TRAY) ×2 IMPLANT
SET ATX-X65L (MISCELLANEOUS) IMPLANT

## 2023-10-21 NOTE — Interval H&P Note (Signed)
History and Physical Interval Note:  10/21/2023 12:59 PM  Alfonse Flavors, MD  has presented today for surgery, with the diagnosis of chest pain, coronary artery disease.  The various methods of treatment have been discussed with the patient and family. After consideration of risks, benefits and other options for treatment, the patient has consented to  Procedure(s): LEFT HEART CATH AND CORONARY ANGIOGRAPHY ([name]) as a surgical intervention.  The patient's history has been reviewed, patient examined, no change in status, stable for surgery.  I have reviewed the patient's chart and labs.  Questions were answered to the patient's satisfaction.     Tonny Bollman

## 2023-10-21 NOTE — Discharge Instructions (Signed)
Radial Site Care  This sheet gives you information about how to care for yourself after your procedure. Your health care provider may also give you more specific instructions. If you have problems or questions, contact your health care provider. What can I expect after the procedure? After the procedure, it is common to have: Bruising and tenderness at the catheter insertion area. Follow these instructions at home: Medicines Take over-the-counter and prescription medicines only as told by your health care provider. Insertion site care Follow instructions from your health care provider about how to take care of your insertion site. Make sure you: Wash your hands with soap and water before you remove your bandage (dressing). If soap and water are not available, use hand sanitizer. May remove dressing in 24 hours. Check your insertion site every day for signs of infection. Check for: Redness, swelling, or pain. Fluid or blood. Pus or a bad smell. Warmth. Do no take baths, swim, or use a hot tub for 5 days. You may shower 24-48 hours after the procedure. Remove the dressing and gently wash the site with plain soap and water. Pat the area dry with a clean towel. Do not rub the site. That could cause bleeding. Do not apply powder or lotion to the site. Activity  For 24 hours after the procedure, or as directed by your health care provider: Do not flex or bend the affected arm. Do not push or pull heavy objects with the affected arm. Do not drive yourself home from the hospital or clinic. You may drive 24 hours after the procedure. Do not operate machinery or power tools. KEEP ARM ELEVATED THE REMAINDER OF THE DAY. Do not push, pull or lift anything that is heavier than 10 lb for 5 days. Ask your health care provider when it is okay to: Return to work or school. Resume usual physical activities or sports. Resume sexual activity. General instructions If the catheter site starts to  bleed, raise your arm and put firm pressure on the site. If the bleeding does not stop, get help right away. This is a medical emergency. DRINK PLENTY OF FLUIDS FOR THE NEXT 2-3 DAYS. No alcohol consumption for 24 hours after receiving sedation. If you went home on the same day as your procedure, a responsible adult should be with you for the first 24 hours after you arrive home. Keep all follow-up visits as told by your health care provider. This is important. Contact a health care provider if: You have a fever. You have redness, swelling, or yellow drainage around your insertion site. Get help right away if: You have unusual pain at the radial site. The catheter insertion area swells very fast. The insertion area is bleeding, and the bleeding does not stop when you hold steady pressure on the area. Your arm or hand becomes pale, cool, tingly, or numb. These symptoms may represent a serious problem that is an emergency. Do not wait to see if the symptoms will go away. Get medical help right away. Call your local emergency services (911 in the [place] Do not drive yourself to the hospital. Summary After the procedure, it is common to have bruising and tenderness at the site. Follow instructions from your health care provider about how to take care of your radial site wound. Check the wound every day for signs of infection.  This information is not intended to replace advice given to you by your health care provider. Make sure you discuss any questions you have with   your health care provider. Document Revised: 12/08/2017 Document Reviewed: 12/08/2017 Elsevier Patient Education  2020 [employer].  

## 2023-10-21 NOTE — Progress Notes (Signed)
Assumed care at 1445. Report received from [facility].

## 2023-10-22 ENCOUNTER — Encounter (HOSPITAL_COMMUNITY): Payer: Self-pay | Admitting: Cardiovascular Disease

## 2023-10-25 ENCOUNTER — Telehealth: Payer: Self-pay | Admitting: Cardiovascular Disease

## 2023-10-25 NOTE — Telephone Encounter (Signed)
Since he has an appt with me in early Jan, we could cancel the appt with Mardelle Matte on 12/30. I think it was originally set up for post-cath follow-up. thx

## 2023-10-25 NOTE — Telephone Encounter (Signed)
Pt would like to know if he needs all of his appointments or if he can cancel. Please advise

## 2023-10-26 NOTE — Telephone Encounter (Signed)
Returned call to patient and advised that he can cancel his appt with Otilio Saber. Lab appt also cancelled since patient had CBC, BMET updated while in hospital. Dermatologist Elmon Else) also checked his lipids and are viewable in system (10/25/23) under care everywhere.

## 2023-10-27 ENCOUNTER — Ambulatory Visit: Payer: Medicare Other | Admitting: Cardiovascular Disease

## 2023-11-15 ENCOUNTER — Ambulatory Visit: Payer: Medicare Other | Admitting: Student

## 2023-11-18 ENCOUNTER — Telehealth: Payer: Self-pay

## 2023-11-18 ENCOUNTER — Other Ambulatory Visit: Payer: Medicare Other

## 2023-11-18 NOTE — Telephone Encounter (Signed)
 Due to provider schedule change, rescheduled the patient's visit with Dr. Excell Seltzer to 1/13 at 10:00AM. Per patient request, called [facility] [phone]) and asked that they send recent lab work.

## 2023-11-18 NOTE — Telephone Encounter (Signed)
 Labs received and sent to [facility] to scan in to Epic.

## 2023-11-23 ENCOUNTER — Ambulatory Visit: Payer: Medicare Other | Admitting: Cardiovascular Disease

## 2023-11-24 ENCOUNTER — Ambulatory Visit: Payer: Medicare Other | Admitting: Cardiovascular Disease

## 2023-11-29 ENCOUNTER — Encounter: Payer: Self-pay | Admitting: Cardiovascular Disease

## 2023-11-29 ENCOUNTER — Ambulatory Visit: Payer: Medicare Other | Attending: Cardiovascular Disease | Admitting: Cardiovascular Disease

## 2023-11-29 VITALS — BP 120/84 | HR 73 | Ht 72.0 in | Wt 181.6 lb

## 2023-11-29 DIAGNOSIS — M25472 Effusion, left ankle: Secondary | ICD-10-CM | POA: Insufficient documentation

## 2023-11-29 DIAGNOSIS — E782 Mixed hyperlipidemia: Secondary | ICD-10-CM | POA: Insufficient documentation

## 2023-11-29 DIAGNOSIS — I4819 Other persistent atrial fibrillation: Secondary | ICD-10-CM | POA: Insufficient documentation

## 2023-11-29 DIAGNOSIS — M25471 Effusion, right ankle: Secondary | ICD-10-CM | POA: Diagnosis not present

## 2023-11-29 DIAGNOSIS — I25118 Atherosclerotic heart disease of native coronary artery with other forms of angina pectoris: Secondary | ICD-10-CM | POA: Diagnosis not present

## 2023-11-29 NOTE — Assessment & Plan Note (Signed)
 Anticoagulated with apixaban and denies bleeding problems.

## 2023-11-29 NOTE — Assessment & Plan Note (Signed)
 Stable on low-dose aspirin  which I recommend continuing after his multiple ACS and PCI procedures.  Seems to have stabilized on Repatha .  We reviewed his cardiac cath films from 2020 and compared them to his recent 2024 study.  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  Continue current management.

## 2023-11-29 NOTE — Progress Notes (Signed)
 Cardiology Office Note:    Date:  11/29/2023   ID:  Walter JAYSON Doyne, MD, DOB 12/17/1945, MRN [id number]  PCP:  Signa Norleen, MD (Inactive)   [facility] Providers Cardiologist:  [name], MD Electrophysiologist:  [name], MD     Referring MD: No ref. 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  The patient was seen by Jodie Passey in November 2024 and he noted exertional chest discomfort of recent onset over the preceding few months.  He also noticed decreased exercise tolerance.  Because of the symptoms he was referred for cardiac catheterization which was performed October 21, 2023.  Catheterization demonstrated stable CAD with patent stents in the circumflex and LAD, moderate stable stenosis in the distal RCA, and no significant changes from the prior study.  Ongoing medical therapy was recommended.  He is here alone today.  No recent problems with angina, but we again discussed the fact that at times when walking uphill he will have chest discomfort that goes away with rest.  This is what prompted his cardiac catheterization as outlined above.  He complains of some generalized fatigue.  The only new symptom he reports is ankle edema.  He has not been sure if this is related to venous insufficiency or concerns about heart failure.  He denies orthopnea or PND.  No shortness of breath.  He does not eat excess sodium.  He occasionally uses compression socks and this helps with his swelling.  No other complaints today.   Current Medications: Current Meds  Medication Sig   acyclovir  (ZOVIRAX ) 400 MG tablet Take 400 mg by mouth 2 (two) times daily as needed (outbreaks).   apixaban  (ELIQUIS ) 5 MG TABS tablet Take 1 tablet (5 mg total) by mouth 2 (two) times daily.   aspirin  EC 81 MG tablet  Take 1 tablet (81 mg total) by mouth daily. Swallow whole.   cetirizine  (ZYRTEC ) 10 MG tablet Take 1 tablet (10 mg total) by mouth 2 (two) times daily. (Patient taking differently: Take 10 mg by mouth daily.)   Cholecalciferol  25 MCG (1000 UT) tablet Take 1,000 Units by mouth daily.   Evolocumab  (REPATHA  SURECLICK) 140 MG/ML SOAJ INJECT 1 PEN UNDER THE SKIN EVERY 14 DAYS   fluocinonide  ointment (LIDEX ) 0.05 % 1 application 1-7 times per week as needed.   losartan  (COZAAR ) 25 MG tablet Take 25 mg by mouth daily.   metroNIDAZOLE (METROGEL) 0.75 % gel Apply 1 Application topically 2 (two) times daily as needed (rosacea).   nitroGLYCERIN  (NITROSTAT ) 0.4 MG SL tablet Place 1 tablet (0.4 mg total) under the tongue every 5 (five) minutes as needed for chest pain (x 3 tabs).   propranolol  (INDERAL ) 10 MG tablet Take 10 mg by mouth 3 (three) times daily as needed (migraines / essential tremors).   [DISCONTINUED] Upadacitinib ER (RINVOQ) 15 MG TB24 Take 1 tablet by mouth daily.   Current Facility-Administered Medications for the 11/29/23 encounter (Office Visit) with Fell Ozell, MD  Medication   dupilumab  (DUPIXENT ) prefilled syringe 300 mg     Allergies:   Ace inhibitors, Amlodipine , Cefaclor, Chlorhexidine , Statins, Dupixent  [dupilumab ], and Xarelto  [rivaroxaban ]   ROS:   Please see the history of present illness.    All other systems reviewed and are negative.  EKGs/Labs/Other Studies Reviewed:    The following studies were reviewed today: Cardiac Studies & Procedures   CARDIAC CATHETERIZATION  CARDIAC CATHETERIZATION 10/21/2023  Narrative   Dist LAD lesion is 50% stenosed.   Prox Cx to Mid Cx lesion is 10% stenosed.   Dist Cx lesion is 100% stenosed.   Dist RCA lesion is 50% stenosed.   Prox LAD to Mid LAD lesion is 40% stenosed.   Non-stenotic Prox LAD lesion was previously treated.   Non-stenotic Mid LAD to Dist LAD lesion was previously treated.   LV end diastolic pressure is  normal.   The left ventricular ejection fraction is 50-55% by visual estimate.  1.  Widely patent left main with no stenosis 2.  Patent LAD with patent stents in the proximal and mid vessel, mild nonobstructive plaquing in the segment between the stents, all unchanged from the prior study 4 years earlier 3.  Patent left circumflex stent with no stenosis.  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  Moderate distal RCA stenosis, angiographically stable 5.  Normal LV function with LVEF estimated at 55% without regional wall motion abnormalities.  Recommendations: Continue medical therapy.  Resume apixaban  tomorrow morning at normal dosing schedule.  Findings Coronary Findings Diagnostic  Dominance: Right  Left Anterior Descending The LAD remains patent, with widely patent stents in the proximal and mid-vessel. The intervening segment between the stents has mild diffuse disease of 40% stenosis between the 2 stents. Non-stenotic Prox LAD lesion was previously treated. The lesion is discrete. Proximal LAD stent remains widely patent. Prox LAD to Mid LAD lesion is 40% stenosed. Non-stenotic Mid LAD to Dist LAD lesion was previously treated. The lesion is eccentric and irregular. The lesion is calcified. diffuse stenosis Dist LAD lesion is 50% stenosed.  Left Circumflex Occluded as the AV circumflex arises from the stent site (unable to accurately depict in the diagram). The mid-circumflex stented segment remains widely patent. Collaterals Dist Cx filled by collaterals from 2nd Mrg.  Prox Cx to Mid Cx lesion is 10% stenosed. The lesion was previously treated using a drug eluting stent over 2 years ago. The AV circumflex is occluded at the stented segment. The stent is widely patent and extends into the OM which divides into 2 branches. The distal circumflex is collateralized from the OM branches. This is unchanged from  the last cath study. Dist Cx lesion is 100% stenosed. chronic occlusion, unchanged from prior studies. Circumflex stent remains patent.  Right Coronary Artery There is mild diffuse disease throughout the vessel. Dist RCA lesion is 50% stenosed. Unchanged from previous cath studies  Intervention  No interventions have been documented.   CARDIAC CATHETERIZATION  CARDIAC CATHETERIZATION 12/13/2018  Narrative  Previously placed Prox LAD drug eluting stent is widely patent.  Balloon angioplasty was performed.  Mid LAD to Dist LAD lesion is 95% stenosed.  Dist LAD lesion is 50% stenosed.  Prox Cx to Mid Cx lesion is 10% stenosed.  Dist Cx lesion is 100% stenosed.  Dist RCA lesion is 50% stenosed.  A drug-eluting stent was successfully placed using a STENT RESOLUTE ONYX 2.5X26.  Post intervention, there is a 0% residual stenosis.  There is mild left ventricular systolic dysfunction.  The left ventricular ejection fraction is 50-55% by visual estimate.  1.  Severe mid LAD stenosis treated successfully with PCI using a 2.5 x 26 mm resolute Onyx DES, postdilated to high pressure with a  3.0 mm noncompliant balloon 2.  Continued patency of the stented segment in the proximal LAD with no significant restenosis 3.  Continued patency of the stented segment in the left circumflex with chronic occlusion of the circumflex subbranch collateralized by left to left collaterals 4.  Moderate stenosis of the distal RCA, unchanged from previous studies, estimated at 50% 5.  Mild global LV systolic dysfunction with LVEF estimated at 50 to 55%  Recommendations: Resume apixaban  tomorrow, continue aspirin  81 mg daily x1 month, continue clopidogrel  75 mg daily x6 months.  The patient has had multiple ACS events.  If he is tolerating a combination of apixaban  and clopidogrel  without significant bleeding problems, it might be reasonable to continue longer than 6 months.  Findings Coronary  Findings Diagnostic  Dominance: Right  Left Anterior Descending Previously placed Prox LAD drug eluting stent is widely patent. The lesion is discrete. Proximal LAD stent remains widely patent. Mid LAD to Dist LAD lesion is 95% stenosed. The lesion is eccentric and irregular. The lesion is calcified. diffuse stenosis Dist LAD lesion is 50% stenosed.  Left Circumflex Occluded as the AV circumflex arises from the stent site (unable to accurately depict in the diagram) Collaterals Dist Cx filled by collaterals from 2nd Mrg.  Prox Cx to Mid Cx lesion is 10% stenosed. The lesion was previously treated using a drug eluting stent over 2 years ago. The AV circumflex is occluded at the stented segment. The stent is widely patent and extends into the OM which divides into 2 branches. The distal circumflex is collateralized from the OM branches. This is unchanged from the last cath study. Dist Cx lesion is 100% stenosed. chronic occlusion, unchanged from prior studies. Circumflex stent remains patent.  Right Coronary Artery There is mild diffuse disease throughout the vessel. Dist RCA lesion is 50% stenosed. Unchanged from the previous studies in 2013 and 2018  Intervention  Mid LAD to Dist LAD lesion Stent CATHETER LAUNCHER 6FREBU 3.5 guide catheter was inserted. Lesion crossed with guidewire using a WIRE COUGAR XT STRL 190CM. Pre-stent angioplasty was performed using a BALLOON SAPPHIRE 2.0X12. A drug-eluting stent was successfully placed using a STENT RESOLUTE ONYX 2.5X26. Post-stent angioplasty was performed using a BALLOON SAPPHIRE [place] 3.0X8. Maximum pressure:  20 atm. After stent deployment, there is no reflow in the vessel.  Intracoronary verapamil  was administered.  Flow returns to normal, TIMI-3.  Angiography is performed and the midportion of the stent appears underexpanded.  The stent had initially been postdilated with a 3.0 x 15 mm noncompliant balloon.  A 3.0 x 8 mm noncompliant balloon was  used to dilate the midportion of the stent to 20 atm on 2 inflations.  This greatly improved the angiographic appearance of stent expansion throughout the stent with 0% residual stenosis and TIMI-3 flow at the completion of the procedure. Post-Intervention Lesion Assessment The intervention was successful. Pre-interventional TIMI flow is 3. Post-intervention TIMI flow is 3. No complications occurred at this lesion. There is a 0% residual stenosis post intervention.   STRESS TESTS  MYOCARDIAL PERFUSION IMAGING 12/05/2015  Narrative  Nuclear stress EF: 47%.  This is a low risk study.  The left ventricular ejection fraction is mildly decreased (45-54%).  Low risk stress nuclear study with no ischemia or infarction; EF 47 with septal hypokinesis; mild LVE.  ECHOCARDIOGRAM  ECHOCARDIOGRAM COMPLETE 04/22/2022  Narrative ECHOCARDIOGRAM REPORT    Patient Name:   DR. NORLEEN JAYSON [name] Date of Exam: 04/22/2022 Medical Rec #:  [id number]  Height:       71.0 in Accession #:    [id number]       Weight:       182.0 lb Date of Birth:  09/17/1946         BSA:          2.026 m Patient Age:    75 years         BP:           127/86 mmHg Patient Gender: M                HR:           77 bpm. Exam Location:  [address]  Procedure: 2D Echo, 3D Echo, Cardiac Doppler and Color Doppler  Indications:    I42.8 Cardiomyopathy, nonischemic  History:        Patient has prior history of Echocardiogram examinations, most recent 12/14/2018. CAD, Arrythmias:Atrial Fibrillation; Risk Factors:Hypertension and HLD.  Sonographer:    Waldo Guadalajara RCS Referring Phys: [phone] [place]  IMPRESSIONS   1. Left ventricular ejection fraction, by estimation, is 50 to 55%. The left ventricle has low normal function. The left ventricle has no regional wall motion abnormalities. There is moderate asymmetric left ventricular hypertrophy of the septal segment. Left ventricular diastolic function could not be  evaluated. 2. Right ventricular systolic function is normal. The right ventricular size is mildly enlarged. There is normal pulmonary artery systolic pressure. The estimated right ventricular systolic pressure is 30.5 mmHg. 3. Right atrial size was mild to moderately dilated. 4. The mitral valve is grossly normal. Trivial mitral valve regurgitation. No evidence of mitral stenosis. 5. The aortic valve is tricuspid. There is mild calcification of the aortic valve. Aortic valve regurgitation is mild. No aortic stenosis is present. 6. The inferior vena cava is dilated in size with >50% respiratory variability, suggesting right atrial pressure of 8 mmHg.  Comparison(s): Changes from prior study are noted. The left ventricular function has improved.  FINDINGS Left Ventricle: Left ventricular ejection fraction, by estimation, is 50 to 55%. The left ventricle has low normal function. The left ventricle has no regional wall motion abnormalities. 3D left ventricular ejection fraction analysis performed but not reported based on interpreter judgement due to suboptimal tracking. The left ventricular internal cavity size was normal in size. There is moderate asymmetric left ventricular hypertrophy of the septal segment. Left ventricular diastolic function could not be evaluated due to atrial fibrillation. Left ventricular diastolic function could not be evaluated.  Right Ventricle: The right ventricular size is mildly enlarged. No increase in right ventricular wall thickness. Right ventricular systolic function is normal. There is normal pulmonary artery systolic pressure. The tricuspid regurgitant velocity is 2.37 m/s, and with an assumed right atrial pressure of 8 mmHg, the estimated right ventricular systolic pressure is 30.5 mmHg.  Left Atrium: Left atrial size was normal in size.  Right Atrium: Right atrial size was mild to moderately dilated.  Pericardium: There is no evidence of pericardial  effusion.  Mitral Valve: The mitral valve is grossly normal. Trivial mitral valve regurgitation. No evidence of mitral valve stenosis.  Tricuspid Valve: The tricuspid valve is grossly normal. Tricuspid valve regurgitation is mild . No evidence of tricuspid stenosis.  Aortic Valve: The aortic valve is tricuspid. There is mild calcification of the aortic valve. Aortic valve regurgitation is mild. Aortic regurgitation PHT measures 351 msec. No aortic stenosis is present.  Pulmonic Valve: The pulmonic valve was grossly normal. Pulmonic valve regurgitation  is mild. No evidence of pulmonic stenosis.  Aorta: The aortic root and ascending aorta are structurally normal, with no evidence of dilitation.  Venous: The inferior vena cava is dilated in size with greater than 50% respiratory variability, suggesting right atrial pressure of 8 mmHg.  IAS/Shunts: The atrial septum is grossly normal.  Additional Comments: A device lead is visualized in the right atrium and right ventricle.   LEFT VENTRICLE PLAX 2D LVIDd:         4.10 cm   Diastology LVIDs:         2.70 cm   LV e' medial:    5.77 cm/s LV PW:         1.00 cm   LV E/e' medial:  12.3 LV IVS:        1.60 cm   LV e' lateral:   14.10 cm/s LVOT diam:     2.10 cm   LV E/e' lateral: 5.0 LV SV:         70 LV SV Index:   34 LVOT Area:     3.46 cm  3D Volume EF: 3D EF:        54 % LV EDV:       153 ml LV ESV:       70 ml LV SV:        83 ml  RIGHT VENTRICLE RV Basal diam:  4.50 cm RV Mid diam:    3.30 cm RV S prime:     10.10 cm/s TAPSE (M-mode): 1.5 cm RVSP:           25.5 mmHg  LEFT ATRIUM             Index        RIGHT ATRIUM           Index LA diam:        5.30 cm 2.62 cm/m   RA Pressure: 3.00 mmHg LA Vol (A2C):   59.3 ml 29.27 ml/m  RA Area:     30.00 cm LA Vol (A4C):   68.4 ml 33.77 ml/m  RA Volume:   101.00 ml 49.86 ml/m LA Biplane Vol: 66.6 ml 32.88 ml/m AORTIC VALVE LVOT Vmax:   98.10 cm/s LVOT Vmean:  65.750  cm/s LVOT VTI:    0.202 m AI PHT:      351 msec  AORTA Ao Root diam: 4.00 cm Ao Asc diam:  4.10 cm  MITRAL VALVE               TRICUSPID VALVE MV Area (PHT):             TR Peak grad:   22.5 mmHg MV Decel Time:             TR Vmax:        237.00 cm/s MV E velocity: 70.75 cm/s  Estimated RAP:  3.00 mmHg RVSP:           25.5 mmHg  SHUNTS Systemic VTI:  0.20 m Systemic Diam: 2.10 cm  Darryle Decent MD Electronically signed by Darryle Decent MD Signature Date/Time: 04/22/2022/10:01:27 AM    Final             EKG:        Recent Labs: 10/07/2023: BUN 18; Creatinine, Ser 0.97; Hemoglobin 13.7; Platelets 183; Potassium 5.1; Sodium 133  Recent Lipid Panel    Component Value Date/Time   CHOL 134 10/16/2022 0831   TRIG 40 10/16/2022 0831   HDL 69  10/16/2022 0831   CHOLHDL 1.9 10/16/2022 0831   LDLCALC 55 10/16/2022 0831   LDLDIRECT 130 (H) 09/13/2017 1358     Risk Assessment/Calculations:    CHA2DS2-VASc Score = 5   This indicates a 7.2% annual risk of stroke. The patient's score is based upon: CHF History: 1 HTN History: 1 Diabetes History: 0 Stroke History: 0 Vascular Disease History: 1 Age Score: 2 Gender Score: 0               Physical Exam:    VS:  BP 120/84   Pulse 73   Ht 6' (1.829 m)   Wt 181 lb 9.6 oz (82.4 kg)   SpO2 97%   BMI 24.63 kg/m     Wt Readings from Last 3 Encounters:  11/29/23 181 lb 9.6 oz (82.4 kg)  10/21/23 180 lb (81.6 kg)  10/07/23 178 lb (80.7 kg)     GEN:  Well nourished, well developed in no acute distress HEENT: Normal NECK: No JVD; No carotid bruits LYMPHATICS: No lymphadenopathy CARDIAC: RRR, no murmurs, rubs, gallops RESPIRATORY:  Clear to auscultation without rales, wheezing or rhonchi  ABDOMEN: Soft, non-tender, non-distended MUSCULOSKELETAL:  No edema; No deformity  SKIN: Warm and dry NEUROLOGIC:  Alert and oriented x 3 PSYCHIATRIC:  Normal affect   Assessment & Plan Persistent atrial fibrillation  (HCC) Anticoagulated with apixaban  and denies bleeding problems.   Coronary artery disease of native artery of native heart with stable angina pectoris (HCC) Stable on low-dose aspirin  which I recommend continuing after his multiple ACS and PCI procedures.  Seems to have stabilized on Repatha .  We reviewed his cardiac cath films from 2020 and compared them to his recent 2024 study.  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  Continue current management. Mixed hyperlipidemia Treated with Repatha .  Patient statin intolerant.  I reviewed his most recent labs which show a total cholesterol 166, HDL 81, triglycerides 64, LDL 71.  Will continue current management.  Suspect with the lipid-lowering therapy, his CAD has stabilized. Ankle edema, bilateral I went back and reviewed previous echo studies which have showed mild to moderate LV dysfunction in the past.  His most recent echo from 2023 showed low normal LVEF of 50 to 55% without regional wall motion abnormalities.  Diastolic dysfunction could not be evaluated.  I recommended an updated echocardiogram to reassess this new symptom.      Medication Adjustments/Labs and Tests Ordered: Current medicines are reviewed at length with the patient today.  Concerns regarding medicines are outlined above.  Orders Placed This Encounter  Procedures   ECHOCARDIOGRAM COMPLETE   No orders of the defined types were placed in this encounter.   Patient Instructions  Testing/Procedures: ECHO Your physician has requested that you have an echocardiogram. Echocardiography is a painless test that uses sound waves to create images of your heart. It provides your doctor with information about the size and shape of your heart and how well your heart's chambers and valves are working. This procedure takes approximately one hour. There are no  restrictions for this procedure. Please do NOT wear cologne, perfume, aftershave, or lotions (deodorant is allowed). Please arrive 15 minutes prior to your appointment time.  Please note: We ask at that you not bring children with you during ultrasound (echo/ vascular) testing. Due to room size and safety concerns, children are not allowed in the  ultrasound rooms during exams. Our front office staff cannot provide observation of children in our lobby area while testing is being conducted. An adult accompanying a patient to their appointment will only be allowed in the ultrasound room at the discretion of the ultrasound technician under special circumstances. We apologize for any inconvenience.  Follow-Up: At [facility], you and your health needs are our priority.  As part of our continuing mission to provide you with exceptional heart care, we have created designated Provider Care Teams.  These Care Teams include your primary Cardiologist (physician) and Advanced Practice Providers (APPs -  Physician Assistants and Nurse Practitioners) who all work together to provide you with the care you need, when you need it.  We recommend signing up for the patient portal called MyChart.  Sign up information is provided on this After Visit Summary.  MyChart is used to connect with patients for Virtual Visits (Telemedicine).  Patients are able to view lab/test results, encounter notes, upcoming appointments, etc.  Non-urgent messages can be sent to your provider as well.   To learn more about what you can do with MyChart, go to [email or web address].    Your next appointment:   6 month(s)  Provider:   [name], MD             Signed, [name], MD  11/29/2023 3:34 PM    [facility]

## 2023-11-29 NOTE — Assessment & Plan Note (Signed)
 Treated with Repatha.  Patient statin intolerant.  I reviewed his most recent labs which show a total cholesterol 166, HDL 81, triglycerides 64, LDL 71.  Will continue current management.  Suspect with the lipid-lowering therapy, his CAD has stabilized.

## 2023-11-29 NOTE — Patient Instructions (Signed)
 Testing/Procedures: ECHO Your physician has requested that you have an echocardiogram. Echocardiography is a painless test that uses sound waves to create images of your heart. It provides your doctor with information about the size and shape of your heart and how well your heart's chambers and valves are working. This procedure takes approximately one hour. There are no restrictions for this procedure. Please do NOT wear cologne, perfume, aftershave, or lotions (deodorant is allowed). Please arrive 15 minutes prior to your appointment time.  Please note: We ask at that you not bring children with you during ultrasound (echo/ vascular) testing. Due to room size and safety concerns, children are not allowed in the ultrasound rooms during exams. Our front office staff cannot provide observation of children in our lobby area while testing is being conducted. An adult accompanying a patient to their appointment will only be allowed in the ultrasound room at the discretion of the ultrasound technician under special circumstances. We apologize for any inconvenience.  Follow-Up: At [facility], you and your health needs are our priority.  As part of our continuing mission to provide you with exceptional heart care, we have created designated Provider Care Teams.  These Care Teams include your primary Cardiologist (physician) and Advanced Practice Providers (APPs -  Physician Assistants and Nurse Practitioners) who all work together to provide you with the care you need, when you need it.  We recommend signing up for the patient portal called MyChart.  Sign up information is provided on this After Visit Summary.  MyChart is used to connect with patients for Virtual Visits (Telemedicine).  Patients are able to view lab/test results, encounter notes, upcoming appointments, etc.  Non-urgent messages can be sent to your provider as well.   To learn more about what you can do with MyChart, go to  [email or web address].    Your next appointment:   6 month(s)  Provider:   [name], MD

## 2023-12-15 ENCOUNTER — Other Ambulatory Visit (HOSPITAL_COMMUNITY): Payer: Medicare Other

## 2023-12-16 ENCOUNTER — Ambulatory Visit (HOSPITAL_COMMUNITY): Payer: Medicare Other

## 2023-12-16 DIAGNOSIS — I4819 Other persistent atrial fibrillation: Secondary | ICD-10-CM | POA: Insufficient documentation

## 2023-12-16 LAB — ECHOCARDIOGRAM COMPLETE: S' Lateral: 2.5 cm

## 2024-01-12 ENCOUNTER — Ambulatory Visit (INDEPENDENT_AMBULATORY_CARE_PROVIDER_SITE_OTHER): Payer: Medicare Other

## 2024-01-12 DIAGNOSIS — I495 Sick sinus syndrome: Secondary | ICD-10-CM

## 2024-01-13 LAB — CUP PACEART REMOTE DEVICE CHECK
Battery Remaining Longevity: 10 mo
Battery Voltage: 2.74 V
Brady Statistic AP VP Percent: 86.99 %
Brady Statistic AP VS Percent: 0 %
Brady Statistic AS VP Percent: 11.42 %
Brady Statistic AS VS Percent: 1.56 %
Brady Statistic RA Percent Paced: 52.95 %
Brady Statistic RV Percent Paced: 98.65 %
Date Time Interrogation Session: 20250226054254
Implantable Lead Connection Status: 753985
Implantable Lead Connection Status: 753985
Implantable Lead Connection Status: 753985
Implantable Lead Implant Date: 20130517
Implantable Lead Implant Date: 20130710
Implantable Lead Implant Date: 20170802
Implantable Lead Location: 753858
Implantable Lead Location: 753859
Implantable Lead Location: 753860
Implantable Pulse Generator Implant Date: 20170802
Lead Channel Impedance Value: 1026 Ohm
Lead Channel Impedance Value: 1064 Ohm
Lead Channel Impedance Value: 1102 Ohm
Lead Channel Impedance Value: 342 Ohm
Lead Channel Impedance Value: 361 Ohm
Lead Channel Impedance Value: 418 Ohm
Lead Channel Impedance Value: 418 Ohm
Lead Channel Impedance Value: 456 Ohm
Lead Channel Impedance Value: 475 Ohm
Lead Channel Impedance Value: 532 Ohm
Lead Channel Impedance Value: 684 Ohm
Lead Channel Impedance Value: 779 Ohm
Lead Channel Impedance Value: 855 Ohm
Lead Channel Impedance Value: 855 Ohm
Lead Channel Pacing Threshold Amplitude: 0.625 V
Lead Channel Pacing Threshold Amplitude: 1.125 V
Lead Channel Pacing Threshold Amplitude: 1.375 V
Lead Channel Pacing Threshold Pulse Width: 0.4 ms
Lead Channel Pacing Threshold Pulse Width: 0.4 ms
Lead Channel Pacing Threshold Pulse Width: 0.8 ms
Lead Channel Sensing Intrinsic Amplitude: 1.5 mV
Lead Channel Sensing Intrinsic Amplitude: 1.5 mV
Lead Channel Sensing Intrinsic Amplitude: 14.25 mV
Lead Channel Sensing Intrinsic Amplitude: 14.25 mV
Lead Channel Setting Pacing Amplitude: 2 V
Lead Channel Setting Pacing Amplitude: 2.25 V
Lead Channel Setting Pacing Amplitude: 2.5 V
Lead Channel Setting Pacing Pulse Width: 0.4 ms
Lead Channel Setting Pacing Pulse Width: 1 ms
Lead Channel Setting Sensing Sensitivity: 4 mV
Zone Setting Status: 755011
Zone Setting Status: 755011

## 2024-02-01 ENCOUNTER — Encounter: Payer: Self-pay | Admitting: Internal Medicine

## 2024-02-16 NOTE — Addendum Note (Signed)
 Addended by: Elease Etienne A on: 02/16/2024 09:53 AM   Modules accepted: Orders

## 2024-02-29 ENCOUNTER — Telehealth: Payer: Self-pay | Admitting: Allergy and Immunology

## 2024-02-29 NOTE — Telephone Encounter (Signed)
 Called patient to schedule Dupixent reapproval. Patient states he is no longer on Dupxient. He is on Rinvoq through his Dermatologist.

## 2024-03-23 ENCOUNTER — Telehealth: Payer: Self-pay | Admitting: Cardiovascular Disease

## 2024-03-23 MED ORDER — REPATHA SURECLICK 140 MG/ML ~~LOC~~ SOAJ: SUBCUTANEOUS | 11 refills | Status: DC

## 2024-03-23 MED ORDER — REPATHA SURECLICK 140 MG/ML ~~LOC~~ SOAJ: 140.0000 mg | SUBCUTANEOUS | 11 refills | Status: DC

## 2024-03-23 NOTE — Telephone Encounter (Signed)
 Spoke with patient and he states he will need a hard copy of repatha  and his last office note to get Rx paid for by the [place].   Last office note and Rx printed for Dr. Arlester Ladd to sign.

## 2024-03-23 NOTE — Telephone Encounter (Signed)
 Pt c/o medication issue:  1. Name of Medication:   Evolocumab  (REPATHA  SURECLICK) 140 MG/ML SOAJ   2. How are you currently taking this medication (dosage and times per day)?   3. Are you having a reaction (difficulty breathing--STAT)?   4. What is your medication issue?    Patient stated he gets most of his medication from the [place] but has been paying for Repatha  out-of-pocket.  Patient wants to pick up a prescription from Dr. Arlester Ladd and a copy of his last [facility] medication [facility] to [facility] medication list.

## 2024-03-27 MED ORDER — REPATHA SURECLICK 140 MG/ML ~~LOC~~ SOAJ: 140.0000 mg | SUBCUTANEOUS | 3 refills | Status: DC

## 2024-03-27 NOTE — Addendum Note (Signed)
 Addended by: Keller Patella on: 03/27/2024 09:34 AM   Modules accepted: Orders

## 2024-03-27 NOTE — Telephone Encounter (Signed)
 Rx for Repatha  was accidentally deleted by triage RN. New one entered and printed at this time.

## 2024-03-29 NOTE — Telephone Encounter (Signed)
 Called and spoke with patient who states he would like to pick up copies of office notes and hardcopy rx for Repatha . He will come tomorrow and I will place at front desk.

## 2024-04-12 ENCOUNTER — Ambulatory Visit: Payer: Medicare Other

## 2024-04-13 LAB — CUP PACEART REMOTE DEVICE CHECK
Battery Remaining Longevity: 5 mo
Battery Voltage: 2.66 V
Brady Statistic AP VP Percent: 82.59 %
Brady Statistic AP VS Percent: 0.01 %
Brady Statistic AS VP Percent: 16.82 %
Brady Statistic AS VS Percent: 0.55 %
Brady Statistic RA Percent Paced: 30.41 %
Brady Statistic RV Percent Paced: 99.24 %
Date Time Interrogation Session: 20250528010922
Implantable Lead Connection Status: 753985
Implantable Lead Connection Status: 753985
Implantable Lead Connection Status: 753985
Implantable Lead Implant Date: 20130517
Implantable Lead Implant Date: 20130710
Implantable Lead Implant Date: 20170802
Implantable Lead Location: 753858
Implantable Lead Location: 753859
Implantable Lead Location: 753860
Implantable Pulse Generator Implant Date: 20170802
Lead Channel Impedance Value: 1045 Ohm
Lead Channel Impedance Value: 1064 Ohm
Lead Channel Impedance Value: 342 Ohm
Lead Channel Impedance Value: 361 Ohm
Lead Channel Impedance Value: 399 Ohm
Lead Channel Impedance Value: 418 Ohm
Lead Channel Impedance Value: 456 Ohm
Lead Channel Impedance Value: 456 Ohm
Lead Channel Impedance Value: 494 Ohm
Lead Channel Impedance Value: 665 Ohm
Lead Channel Impedance Value: 760 Ohm
Lead Channel Impedance Value: 798 Ohm
Lead Channel Impedance Value: 817 Ohm
Lead Channel Impedance Value: 988 Ohm
Lead Channel Pacing Threshold Amplitude: 0.75 V
Lead Channel Pacing Threshold Amplitude: 1.125 V
Lead Channel Pacing Threshold Amplitude: 1.375 V
Lead Channel Pacing Threshold Pulse Width: 0.4 ms
Lead Channel Pacing Threshold Pulse Width: 0.4 ms
Lead Channel Pacing Threshold Pulse Width: 0.8 ms
Lead Channel Sensing Intrinsic Amplitude: 1.625 mV
Lead Channel Sensing Intrinsic Amplitude: 1.625 mV
Lead Channel Sensing Intrinsic Amplitude: 14.25 mV
Lead Channel Sensing Intrinsic Amplitude: 14.25 mV
Lead Channel Setting Pacing Amplitude: 2 V
Lead Channel Setting Pacing Amplitude: 2.25 V
Lead Channel Setting Pacing Amplitude: 2.5 V
Lead Channel Setting Pacing Pulse Width: 0.4 ms
Lead Channel Setting Pacing Pulse Width: 1 ms
Lead Channel Setting Sensing Sensitivity: 4 mV
Zone Setting Status: 755011
Zone Setting Status: 755011

## 2024-04-16 ENCOUNTER — Ambulatory Visit: Payer: Self-pay | Admitting: Cardiology

## 2024-04-22 ENCOUNTER — Other Ambulatory Visit: Payer: Self-pay | Admitting: Cardiovascular Disease

## 2024-04-24 ENCOUNTER — Encounter: Payer: Self-pay | Admitting: Pharmacist

## 2024-05-10 ENCOUNTER — Telehealth: Payer: Self-pay | Admitting: Diagnostic Neuroimaging

## 2024-05-10 ENCOUNTER — Ambulatory Visit (INDEPENDENT_AMBULATORY_CARE_PROVIDER_SITE_OTHER): Admitting: Diagnostic Neuroimaging

## 2024-05-10 ENCOUNTER — Encounter: Payer: Self-pay | Admitting: Diagnostic Neuroimaging

## 2024-05-10 VITALS — BP 129/80 | HR 70 | Ht 73.0 in | Wt 179.0 lb

## 2024-05-10 DIAGNOSIS — G629 Polyneuropathy, unspecified: Secondary | ICD-10-CM | POA: Diagnosis not present

## 2024-05-10 DIAGNOSIS — R269 Unspecified abnormalities of gait and mobility: Secondary | ICD-10-CM

## 2024-05-10 NOTE — Progress Notes (Signed)
 [facility]  PATIENT: Walter JAYSON Doyne, MD DOB: 09-Apr-1946  REFERRING CLINICIAN: Joshua Alm Hamilton, MD HISTORY FROM: patient  REASON FOR VISIT: new consult   HISTORICAL  CHIEF COMPLAINT:  Chief Complaint  Patient presents with   Numbness    RM 7 alone Pt is well, reports he has been having numbness and occasional tingling in feet for about 6 months.  He also reports imbalance and weakness in bilateral legs.  Concerned for normal pressure hydrocephalus.     HISTORY OF PRESENT ILLNESS:   78-year-old male here for evaluation of numbness coldness feeling in the feet.  Symptom started about 6 months ago.  Also has some balance issues, wide-based gait, intermittent leg weakness also over the same timeframe.  Went to neurosurgery clinic and then had EMG nerve conduction study demonstrating sensorimotor polyneuropathy.  Here for evaluation.  Patient also has had some issues related to incontinence following prostatectomy in 2010.  Has history of essential tremor on propranolol .  Has a brother with NPH to had a shunt placed.  Also with some chronic thoracic and lumbar spine pain issues.    REVIEW OF SYSTEMS: Full 14 system review of systems performed and negative with exception of: as per HPI.  ALLERGIES: Allergies  Allergen Reactions   Ace Inhibitors Other (See Comments)    Does not take due to angiotensin is responsible for maintaining bladder sphincter tone   Amlodipine  Other (See Comments)    lethargy   Cefaclor Hives, Nausea And Vomiting and Rash   Chlorhexidine  Itching and Rash   Statins Other (See Comments)    Muscle weakness    Dupixent  [Dupilumab ]     vitiligo   Xarelto  [Rivaroxaban ] Other (See Comments)    Bleeding    HOME MEDICATIONS: Outpatient Medications Prior to Visit  Medication Sig Dispense Refill   acyclovir  (ZOVIRAX ) 400 MG tablet Take 400 mg by mouth 2 (two) times daily as needed (outbreaks).     apixaban  (ELIQUIS ) 5 MG TABS tablet  Take 1 tablet (5 mg total) by mouth 2 (two) times daily. 60 tablet 3   aspirin  EC 81 MG tablet Take 1 tablet (81 mg total) by mouth daily. Swallow whole.     Cholecalciferol  25 MCG (1000 UT) tablet Take 1,000 Units by mouth daily.     Evolocumab  (REPATHA  SURECLICK) 140 MG/ML SOAJ INJECT 1 PEN UNDER THE SKIN EVERY 14 DAYS 2 mL 11   fluocinonide  ointment (LIDEX ) 0.05 % 1 application 1-7 times per week as needed. 30 g 11   losartan  (COZAAR ) 25 MG tablet Take 25 mg by mouth daily.     metroNIDAZOLE (METROGEL) 0.75 % gel Apply 1 Application topically 2 (two) times daily as needed (rosacea).     nitroGLYCERIN  (NITROSTAT ) 0.4 MG SL tablet Place 1 tablet (0.4 mg total) under the tongue every 5 (five) minutes as needed for chest pain (x 3 tabs). 25 tablet 3   propranolol  (INDERAL ) 10 MG tablet Take 10 mg by mouth 3 (three) times daily as needed (migraines / essential tremors).     cetirizine  (ZYRTEC ) 10 MG tablet Take 1 tablet (10 mg total) by mouth 2 (two) times daily. (Patient not taking: Reported on 05/10/2024) 60 tablet 11   Facility-Administered Medications Prior to Visit  Medication Dose Route Frequency Provider Last Rate Last Admin   dupilumab  (DUPIXENT ) prefilled syringe 300 mg  300 mg Subcutaneous Q14 Days Kozlow, Eric J, MD   300 mg at 05/06/23 1052    PAST MEDICAL HISTORY:  Past Medical History:  Diagnosis Date   Atrial flutter -atypical 2007   ABLATION x2 [facility]   Atrial tachycardia (HCC) 2007   ABLATION [facility]   Benign paroxysmal vertigo    CAD (coronary artery disease)    a. abnl mv 01/2012;  b. cath 05/23/2012 LCX 80@ bifurcation w/ OM, 95% @ AV groove, otw nonobs dzs, EF 55-65%;   Carcinoma (HCC)    skin cancer off my chest   Familial tremor    First degree AV block    had 4 ablasions that left py with heart block   History of blood transfusion 2010   2nd day after prostatectomy   Hyperlipidemia    Hypertension    Incontinence    DECREASING INCONTINENCE RELATED TO PROSTATECTOMY.    Migraines    Presence of permanent cardiac pacemaker 2013 ?   Prostate cancer (HCC)    Pulsatile tinnitus    Tachy-brady syndrome (HCC)    a. developed after RFCA @ [facility] 2013-> MDT PPM;  b. s/p RA lead revision 05/2012    PAST SURGICAL HISTORY: Past Surgical History:  Procedure Laterality Date   ATRIAL ABLATION SURGERY  04/01/12   4th time I had one   CARDIAC CATHETERIZATION     CARDIOVERSION  12/03/2011   Procedure: CARDIOVERSION;  Surgeon: Elspeth JAYSON Sage, MD;  Location: [facility] OR;  Service: Cardiovascular;  Laterality: [name];   CARDIOVERSION  01/13/2012   Procedure: CARDIOVERSION;  Surgeon: Redell GORMAN Shallow, MD;  Location: [facility] OR;  Service: Cardiovascular;  Laterality: [name];   CARDIOVERSION  04/27/2012   Procedure: CARDIOVERSION;  Surgeon: Debby Gallop, MD;  Location: [facility] OR;  Service: Cardiovascular;  Laterality: [name];   COLONOSCOPY     COLONOSCOPY WITH PROPOFOL  [name] 10/29/2016   Procedure: COLONOSCOPY WITH PROPOFOL ;  Surgeon: Victory LITTIE Legrand DOUGLAS, MD;  Location: [facility];  Service: Gastroenterology;  Laterality: [name];   CORONARY ANGIOPLASTY WITH STENT PLACEMENT  06/02/12   1; first one ever   CORONARY STENT INTERVENTION [name] 07/21/2017   Procedure: CORONARY STENT INTERVENTION;  Surgeon: Wonda Sharper, MD;  Location: [facility] INVASIVE CV LAB;  Service: Cardiovascular;  Laterality: [name];   CORONARY STENT INTERVENTION [name] 12/13/2018   Procedure: CORONARY STENT INTERVENTION;  Surgeon: Wonda Sharper, MD;  Location: [facility] INVASIVE CV LAB;  Service: Cardiovascular;  Laterality: [name];   EP IMPLANTABLE DEVICE [name] 06/17/2016   Procedure: BiV Pacemaker upgrade;  Surgeon: Elspeth JAYSON Sage, MD;  Location: [facility] INVASIVE CV LAB;  Service: Cardiovascular;  Laterality: [name];   HERNIA REPAIR  1980's   INGUINAL HERNIA REPAIR  1945   left side   INSERT / REPLACE / REMOVE PACEMAKER  03/2012   initial placement   INSERT / REPLACE / REMOVE PACEMAKER  05/25/12   atrial lead change   LEAD REVISION [name] 05/25/2012    Procedure: LEAD REVISION;  Surgeon: Danelle LELON Birmingham, MD;  Location[facility] C[facility] LAB;  Service: Cardiovascular;  Laterality: [name];   LEFT HEART CATH AND CORONARY ANGIOGRAPHY [name] 07/21/2017   Procedure: LEFT HEART CATH AND CORONARY ANGIOGRAPHY;  Surgeon: Wonda Sharper, MD;  Location: [facility] INVASIVE CV LAB;  Service: Cardiovascular;  Laterality: [name];   LEFT HEART CATH AND CORONARY ANGIOGRAPHY [name] 12/13/2018   Procedure: LEFT HEART CATH AND CORONARY ANGIOGRAPHY;  Surgeon: Wonda Sharper, MD;  Location: [facility] INVASIVE CV LAB;  Service: Cardiovascular;  Laterality: [name];   LEFT HEART CATH AND CORONARY ANGIOGRAPHY [name] 10/21/2023   Procedure: LEFT HEART CATH AND CORONARY ANGIOGRAPHY;  Surgeon: Wonda Sharper, MD;  Location: [facility] INVASIVE CV LAB;  Service: Cardiovascular;  Laterality: [name];   PERCUTANEOUS CORONARY STENT INTERVENTION (PCI-S) [name] 06/02/2012   Procedure: PERCUTANEOUS CORONARY STENT INTERVENTION (PCI-S);  Surgeon: Debby JONETTA Como, MD;  Location: [facility] CATH LAB;  Service: Cardiovascular;  Laterality: [name];   PROSTATECTOMY  02/20/2009   COMPLICATED   SHOULDER SURGERY     Bilateral; boney spurs removed   SKIN CANCER EXCISION     in situ; right chest   TONSILLECTOMY AND ADENOIDECTOMY     as a child    FAMILY HISTORY: Family History  Problem Relation Age of Onset   Heart disease Father    Colon cancer Sister    Heart disease Brother        only one brother   Diabetes Brother        only one brother   Hydrocephalus Brother    Colon polyps Neg Hx    Rectal cancer Neg Hx    Stomach cancer Neg Hx    Esophageal cancer Neg Hx     SOCIAL HISTORY: Social History   Socioeconomic History   Marital status: Married    Spouse name: Not on file   Number of children: Not on file   Years of education: Not on file   Highest education level: Not on file  Occupational History   Occupation: Physician    Comment: Retired - ENT  Tobacco Use   Smoking status: Never   Smokeless tobacco: Never  Vaping Use    Vaping status: Never Used  Substance and Sexual Activity   Alcohol use: Yes    Alcohol/week: 3.0 standard drinks of alcohol    Types: 3 Standard drinks or equivalent per week    Comment: drinks alcohol at least once every 3 months 05/25/12 last alcohol 02/2012   Drug use: No   Sexual activity: Not Currently  Other Topics Concern   Not on file  Social History Narrative   Retired ENT   Social Drivers of Corporate investment banker Strain: Not on file  Food Insecurity: Low Risk  (07/15/2023)   Received from [facility]   Hunger Vital Sign    Within the past 12 months, you worried that your food would run out before you got money to buy more: Never true    Within the past 12 months, the food you bought just didn't last and you didn't have money to get more. : Never true  Transportation Needs: No Transportation Needs (07/15/2023)   Received from [facility]    In the past 12 months, has lack of reliable transportation kept you from medical appointments, meetings, work or from getting things needed for daily living? : No  Physical Activity: Not on file  Stress: Not on file  Social Connections: Not on file  Intimate Partner Violence: Not on file     PHYSICAL EXAM  GENERAL EXAM/CONSTITUTIONAL: Vitals:  Vitals:   05/10/24 0843  BP: 129/80  Pulse: 70  Weight: 179 lb (81.2 kg)  Height: 6' 1 (1.854 m)   Body mass index is 23.62 kg/m. Wt Readings from Last 3 Encounters:  05/10/24 179 lb (81.2 kg)  11/29/23 181 lb 9.6 oz (82.4 kg)  10/21/23 180 lb (81.6 kg)   Patient is in no distress; well developed, nourished and groomed; neck is supple  CARDIOVASCULAR: Examination of carotid arteries is normal; no carotid bruits Regular rate and rhythm, no murmurs Examination of peripheral vascular system by observation and palpation is normal  EYES: Ophthalmoscopic exam of optic discs and posterior segments is normal; no papilledema or hemorrhages No results  found.  MUSCULOSKELETAL: Gait, strength, tone, movements noted in Neurologic exam below  NEUROLOGIC: MENTAL STATUS:      No data to display         awake, alert, oriented to person, place and time recent and remote memory intact normal attention and concentration language fluent, comprehension intact, naming intact fund of knowledge appropriate  CRANIAL NERVE:  2nd - no papilledema on fundoscopic exam 2nd, 3rd, 4th, 6th - pupils equal and reactive to light, visual fields full to confrontation, extraocular muscles intact, no nystagmus 5th - facial sensation symmetric 7th - facial strength symmetric 8th - hearing intact 9th - palate elevates symmetrically, uvula midline 11th - shoulder shrug symmetric 12th - tongue protrusion midline  MOTOR:  normal bulk and tone, full strength in the BUE, BLE  SENSORY:  normal and symmetric to light touch; DECR PP, VIB, TEMP IN FEET / TOES  COORDINATION:  finger-nose-finger, fine finger movements --> INTENTION TREMOR WITH LUE > RUE  REFLEXES:  deep tendon reflexes --> BUE 2; KNEES 2; ANKLES ABSENT  GAIT/STATION:  WIDE BASED, SLOW GAIT; SHORT STEPS; EN BLOC TURNING; KYPHOTIC POSTURE; able to walk on toes, heels WITH HOLDING WALL; romberg is negative (BUT NEEDS INCREASED FOCUS / ATTENTION)     DIAGNOSTIC DATA (LABS, IMAGING, TESTING) - I reviewed patient records, labs, notes, testing and imaging myself where available.  Lab Results  Component Value Date   WBC 4.5 10/07/2023   HGB 13.7 10/07/2023   HCT 40.7 10/07/2023   MCV 92 10/07/2023   PLT 183 10/07/2023      Component Value Date/Time   NA 133 (L) 10/07/2023 1101   K 5.1 10/07/2023 1101   CL 97 10/07/2023 1101   CO2 25 10/07/2023 1101   GLUCOSE 90 10/07/2023 1101   GLUCOSE 109 (H) 11/15/2021 0543   BUN 18 10/07/2023 1101   CREATININE 0.97 10/07/2023 1101   CREATININE 1.06 06/15/2016 1002   CALCIUM  9.3 10/07/2023 1101   PROT 6.0 10/16/2022 0831   ALBUMIN 4.3  10/16/2022 0831   AST 23 10/16/2022 0831   ALT 20 10/16/2022 0831   ALKPHOS 111 10/16/2022 0831   BILITOT 1.5 (H) 10/16/2022 0831   GFRNONAA >60 11/15/2021 0543   GFRAA >60 12/14/2018 0251   Lab Results  Component Value Date   CHOL 134 10/16/2022   HDL 69 10/16/2022   LDLCALC 55 10/16/2022   LDLDIRECT 130 (H) 09/13/2017   TRIG 40 10/16/2022   CHOLHDL 1.9 10/16/2022   No results found for: HGBA1C No results found for: VITAMINB12 No results found for: TSH    ASSESSMENT AND PLAN  78 y.o. year old male here with:  Dx:  1. Neuropathy   2. Gait difficulty     PLAN:  GAIT DIFF, numbness in feet (progressive sicne early 2025; ddx: neuropathy, lumbar radiculopathy, cervical myelopathy, stroke, vascular, autoimmune causes) - check neuropathy labs - check MRI brain, cervical spine - fall precautions reviewed  Orders Placed This Encounter  Procedures   MR BRAIN W WO CONTRAST   MR CERVICAL SPINE WO CONTRAST   Vitamin B12   SPEP with IFE   ANA w/Reflex   SSA, SSB   Vitamin B6   Vitamin B1   TSH Rfx on Abnormal to Free T4   Return for pending if symptoms worsen or fail to improve, pending test results.    EDUARD FABIENE HANLON, MD  05/10/2024, 9:59 AM Certified in Neurology, Neurophysiology and Neuroimaging  [facility] [address] [place], [place] [address] [phone]

## 2024-05-10 NOTE — Telephone Encounter (Signed)
 No auth required sent to [facility] to schedule [phone]. He can't go to GI since he has a pacemaker.

## 2024-05-11 LAB — MULTIPLE MYELOMA PANEL, SERUM

## 2024-05-15 ENCOUNTER — Ambulatory Visit (INDEPENDENT_AMBULATORY_CARE_PROVIDER_SITE_OTHER)

## 2024-05-15 DIAGNOSIS — I495 Sick sinus syndrome: Secondary | ICD-10-CM

## 2024-05-15 LAB — VITAMIN B1: Thiamine: 92.1 nmol/L (ref 66.5–200.0)

## 2024-05-15 LAB — VITAMIN B6: Vitamin B6: 18.4 ug/L (ref 3.4–65.2)

## 2024-05-15 LAB — ANA W/REFLEX

## 2024-05-15 LAB — MULTIPLE MYELOMA PANEL, SERUM
Albumin SerPl Elph-Mcnc: 4.2 g/dL (ref 2.9–4.4)
Albumin/Glob SerPl: 2 — AB (ref 0.7–1.7)
Alpha 1: 0.2 g/dL (ref 0.0–0.4)
B-Globulin SerPl Elph-Mcnc: 0.8 g/dL (ref 0.7–1.3)
Gamma Glob SerPl Elph-Mcnc: 0.8 g/dL (ref 0.4–1.8)
IgA/Immunoglobulin A, Serum: 196 mg/dL (ref 61–437)
IgM (Immunoglobulin M), Srm: 35 mg/dL (ref 15–143)

## 2024-05-15 LAB — SJOGREN'S SYNDROME ANTIBODS(SSA + SSB)
ENA SSA (RO) Ab: 0.2 AI (ref 0.0–0.9)
ENA SSB (LA) Ab: <0.2 AI (ref 0.0–0.9)

## 2024-05-15 LAB — TSH RFX ON ABNORMAL TO FREE T4: TSH: 1.47 u[IU]/mL (ref 0.450–4.500)

## 2024-05-15 LAB — VITAMIN B12: Vitamin B-12: 1387 pg/mL — ABNORMAL HIGH (ref 232–1245)

## 2024-05-16 LAB — CUP PACEART REMOTE DEVICE CHECK
Battery Remaining Longevity: 5 mo
Battery Voltage: 2.64 V
Brady Statistic AP VP Percent: 98.31 %
Brady Statistic AP VS Percent: 0 %
Brady Statistic AS VP Percent: 1.31 %
Brady Statistic AS VS Percent: 0.38 %
Brady Statistic RA Percent Paced: 94.86 %
Brady Statistic RV Percent Paced: 99.6 %
Date Time Interrogation Session: 20250630010356
Implantable Lead Connection Status: 753985
Implantable Lead Connection Status: 753985
Implantable Lead Connection Status: 753985
Implantable Lead Implant Date: 20130517
Implantable Lead Implant Date: 20130710
Implantable Lead Implant Date: 20170802
Implantable Lead Location: 753858
Implantable Lead Location: 753859
Implantable Lead Location: 753860
Implantable Pulse Generator Implant Date: 20170802
Lead Channel Impedance Value: 1026 Ohm
Lead Channel Impedance Value: 1083 Ohm
Lead Channel Impedance Value: 1102 Ohm
Lead Channel Impedance Value: 323 Ohm
Lead Channel Impedance Value: 361 Ohm
Lead Channel Impedance Value: 399 Ohm
Lead Channel Impedance Value: 418 Ohm
Lead Channel Impedance Value: 456 Ohm
Lead Channel Impedance Value: 475 Ohm
Lead Channel Impedance Value: 513 Ohm
Lead Channel Impedance Value: 684 Ohm
Lead Channel Impedance Value: 779 Ohm
Lead Channel Impedance Value: 836 Ohm
Lead Channel Impedance Value: 855 Ohm
Lead Channel Pacing Threshold Amplitude: 0.75 V
Lead Channel Pacing Threshold Amplitude: 1.125 V
Lead Channel Pacing Threshold Amplitude: 1.375 V
Lead Channel Pacing Threshold Pulse Width: 0.4 ms
Lead Channel Pacing Threshold Pulse Width: 0.4 ms
Lead Channel Pacing Threshold Pulse Width: 0.8 ms
Lead Channel Sensing Intrinsic Amplitude: 0.5 mV
Lead Channel Sensing Intrinsic Amplitude: 0.5 mV
Lead Channel Sensing Intrinsic Amplitude: 14.25 mV
Lead Channel Sensing Intrinsic Amplitude: 14.25 mV
Lead Channel Setting Pacing Amplitude: 2 V
Lead Channel Setting Pacing Amplitude: 2.25 V
Lead Channel Setting Pacing Amplitude: 2.5 V
Lead Channel Setting Pacing Pulse Width: 0.4 ms
Lead Channel Setting Pacing Pulse Width: 1 ms
Lead Channel Setting Sensing Sensitivity: 4 mV
Zone Setting Status: 755011
Zone Setting Status: 755011

## 2024-05-17 NOTE — Telephone Encounter (Signed)
 Pacemaker is not MRI safe per Andree Ang at [facility].

## 2024-05-18 ENCOUNTER — Ambulatory Visit: Payer: Self-pay | Admitting: Neurology

## 2024-05-18 NOTE — Telephone Encounter (Signed)
 The MRI team at [facility] said his pacemaker is not MRI safe. Can we order a brain and cervical CT for him to get done instead?

## 2024-05-18 NOTE — Telephone Encounter (Signed)
 I had sent the info the patient sent below this morning to the MRI scheduling team at [facility]. Their MRI tech Bernardino Meissner sent this message: Not MR Conditional The patient's cardiac system is not MR Conditional due to the following reason: The cardiac system does not have MR conditional leads or configuration. There may be additional reasons the patient is ineligible for MRI. Pt has a Medtronic generator w/ St. Jude leads. This info is from [employer]   Their scheduler Andree Ang called the patient to relay the above message and she said:  I called him to try to explain and he still is insisting it can be done tried to tell him because different manufacture.    She sent this message to her boss Consuelo Peasant and asked her to call the patient.   This was the last message sent in the Epic chat.

## 2024-05-21 ENCOUNTER — Ambulatory Visit: Payer: Self-pay | Admitting: Cardiology

## 2024-05-24 NOTE — Telephone Encounter (Signed)
 approved

## 2024-05-24 NOTE — Addendum Note (Signed)
 Addended by: Klayton Monie B on: 05/24/2024 08:55 AM   Modules accepted: Orders

## 2024-05-24 NOTE — Telephone Encounter (Signed)
 Dr Margaret ordered MRI Brain w/wo and Cervical. I am placing order for CT w/wo head and cervical to GI. Pacemaker is not MRI safe.

## 2024-05-24 NOTE — Addendum Note (Signed)
 Addended by: ROBBERT GOSLING D on: 05/24/2024 08:40 AM   Modules accepted: Orders

## 2024-05-25 NOTE — Telephone Encounter (Signed)
 CT scans were sent to GI and they left him a voice mail to schedule yesterday. [phone]

## 2024-06-07 ENCOUNTER — Ambulatory Visit
Admission: RE | Admit: 2024-06-07 | Discharge: 2024-06-07 | Disposition: A | Discharge status: Home / Self Care | Source: Ambulatory Visit | Attending: Neurology | Admitting: Neurology

## 2024-06-07 DIAGNOSIS — R269 Unspecified abnormalities of gait and mobility: Secondary | ICD-10-CM

## 2024-06-07 MED ORDER — IOPAMIDOL (ISOVUE-300) INJECTION 61%
75.0000 mL | Freq: Once | INTRAVENOUS | Status: AC | PRN
  Administered 2024-06-07: 75 mL via INTRAVENOUS

## 2024-06-11 NOTE — Progress Notes (Unsigned)
  Electrophysiology Office Note:   ID:  Walter JAYSON Doyne, MD, DOB Apr 13, 1946, MRN [id number]  Primary Cardiologist: [name], MD Electrophysiologist: [name], MD  {Click to update primary MD,subspecialty MD or APP then REFRESH:1}    History of Present Illness:   Walter JAYSON Doyne, MD is a 78 y.o. male with h/o PAF s/p PVI x 2 at [facility] CHB s/p CRTP, CAD with prior stenting, SND, PJC/PVCs, and HTN seen today for routine electrophysiology followup.   Since last being seen in our clinic the patient reports doing ***.  he denies chest pain, palpitations, dyspnea, PND, orthopnea, nausea, vomiting, dizziness, syncope, edema, weight gain, or early satiety.   Review of systems complete and found to be negative unless listed in HPI.   EP Information / Studies Reviewed:    EKG is ordered today. Personal review as below.       PPM Interrogation-  reviewed in detail today,  See PACEART report.  Arrhythmia/Device History BiV PPM-Medtronic-Carelink   Physical Exam:   VS:  There were no vitals taken for this visit.   Wt Readings from Last 3 Encounters:  05/10/24 179 lb (81.2 kg)  11/29/23 181 lb 9.6 oz (82.4 kg)  10/21/23 180 lb (81.6 kg)     GEN: No acute distress  NECK: No JVD; No carotid bruits CARDIAC: {EPRHYTHM:28826}, no murmurs, rubs, gallops RESPIRATORY:  Clear to auscultation without rales, wheezing or rhonchi  ABDOMEN: Soft, non-tender, non-distended EXTREMITIES:  {EDEMA LEVEL:28147::No} edema; No deformity   ASSESSMENT AND PLAN:    CHB s/p Medtronic PPM  Normal PPM function See Pace Art report No changes today  AF/AFL Remote ablations at [facility]  PVCs/PJCs/PACs Stable  CAD October 21, 2023 catheterization demonstrated stable CAD with patent stents in the circumflex and LAD, moderate stable stenosis in the distal RCA, and no significant changes from the prior stud   {Click here to Review PMH, Prob List, Meds, Allergies, SHx, FHx  :1}   Disposition:   Follow up with  {EPPROVIDERS:28135::EP Team} {EPFOLLOW UP:28173}  Signed, Ozell Prentice Passey, PA-C

## 2024-06-12 ENCOUNTER — Ambulatory Visit: Attending: Student | Admitting: Student

## 2024-06-12 ENCOUNTER — Encounter: Payer: Self-pay | Admitting: Student

## 2024-06-12 VITALS — BP 118/72 | HR 72 | Ht 73.0 in | Wt 179.9 lb

## 2024-06-12 DIAGNOSIS — I25118 Atherosclerotic heart disease of native coronary artery with other forms of angina pectoris: Secondary | ICD-10-CM | POA: Diagnosis present

## 2024-06-12 DIAGNOSIS — I4819 Other persistent atrial fibrillation: Secondary | ICD-10-CM | POA: Diagnosis present

## 2024-06-12 DIAGNOSIS — I495 Sick sinus syndrome: Secondary | ICD-10-CM | POA: Insufficient documentation

## 2024-06-12 DIAGNOSIS — I442 Atrioventricular block, complete: Secondary | ICD-10-CM | POA: Insufficient documentation

## 2024-06-12 LAB — CUP PACEART INCLINIC DEVICE CHECK
Battery Remaining Longevity: 5 mo
Battery Voltage: 2.63 V
Brady Statistic AP VP Percent: 91.38 %
Brady Statistic AP VS Percent: 0 %
Brady Statistic AS VP Percent: 7.75 %
Brady Statistic AS VS Percent: 0.86 %
Brady Statistic RA Percent Paced: 55.38 %
Brady Statistic RV Percent Paced: 99.1 %
Date Time Interrogation Session: 20250728083753
Implantable Lead Connection Status: 753985
Implantable Lead Connection Status: 753985
Implantable Lead Connection Status: 753985
Implantable Lead Implant Date: 20130517
Implantable Lead Implant Date: 20130710
Implantable Lead Implant Date: 20170802
Implantable Lead Location: 753858
Implantable Lead Location: 753859
Implantable Lead Location: 753860
Implantable Pulse Generator Implant Date: 20170802
Lead Channel Impedance Value: 1026 Ohm
Lead Channel Impedance Value: 1026 Ohm
Lead Channel Impedance Value: 1083 Ohm
Lead Channel Impedance Value: 342 Ohm
Lead Channel Impedance Value: 380 Ohm
Lead Channel Impedance Value: 418 Ohm
Lead Channel Impedance Value: 418 Ohm
Lead Channel Impedance Value: 456 Ohm
Lead Channel Impedance Value: 475 Ohm
Lead Channel Impedance Value: 494 Ohm
Lead Channel Impedance Value: 703 Ohm
Lead Channel Impedance Value: 779 Ohm
Lead Channel Impedance Value: 817 Ohm
Lead Channel Impedance Value: 836 Ohm
Lead Channel Pacing Threshold Amplitude: 0.75 V
Lead Channel Pacing Threshold Amplitude: 1.125 V
Lead Channel Pacing Threshold Amplitude: 1.375 V
Lead Channel Pacing Threshold Pulse Width: 0.4 ms
Lead Channel Pacing Threshold Pulse Width: 0.4 ms
Lead Channel Pacing Threshold Pulse Width: 0.8 ms
Lead Channel Sensing Intrinsic Amplitude: 0.375 mV
Lead Channel Sensing Intrinsic Amplitude: 0.375 mV
Lead Channel Sensing Intrinsic Amplitude: 14.25 mV
Lead Channel Sensing Intrinsic Amplitude: 14.25 mV
Lead Channel Setting Pacing Amplitude: 2 V
Lead Channel Setting Pacing Amplitude: 2.25 V
Lead Channel Setting Pacing Amplitude: 2.5 V
Lead Channel Setting Pacing Pulse Width: 0.4 ms
Lead Channel Setting Pacing Pulse Width: 1 ms
Lead Channel Setting Sensing Sensitivity: 4 mV
Zone Setting Status: 755011
Zone Setting Status: 755011

## 2024-06-12 NOTE — Patient Instructions (Signed)
 Medication Instructions:  Your physician recommends that you continue on your current medications as directed. Please refer to the Current Medication list given to you today.  *If you need a refill on your cardiac medications before your next appointment, please call your pharmacy*  Lab Work: None ordered If you have labs (blood work) drawn today and your tests are completely normal, you will receive your results only by: MyChart Message (if you have MyChart) OR A paper copy in the mail If you have any lab test that is abnormal or we need to change your treatment, we will call you to review the results.  Follow-Up: At [facility], you and your health needs are our priority.  As part of our continuing mission to provide you with exceptional heart care, our providers are all part of one team.  This team includes your primary Cardiologist (physician) and Advanced Practice Providers or APPs (Physician Assistants and Nurse Practitioners) who all work together to provide you with the care you need, when you need it.  Your next appointment:   5-6 month(s)  Provider:   Fonda Kitty, MD

## 2024-06-15 ENCOUNTER — Ambulatory Visit

## 2024-06-15 DIAGNOSIS — I495 Sick sinus syndrome: Secondary | ICD-10-CM

## 2024-06-16 LAB — CUP PACEART REMOTE DEVICE CHECK
Battery Remaining Longevity: 5 mo
Battery Voltage: 2.62 V
Brady Statistic AP VP Percent: 99.24 %
Brady Statistic AP VS Percent: 0.01 %
Brady Statistic AS VP Percent: 0.4 %
Brady Statistic AS VS Percent: 0.35 %
Brady Statistic RA Percent Paced: 81.66 %
Brady Statistic RV Percent Paced: 99.64 %
Date Time Interrogation Session: 20250730205119
Implantable Lead Connection Status: 753985
Implantable Lead Connection Status: 753985
Implantable Lead Connection Status: 753985
Implantable Lead Implant Date: 20130517
Implantable Lead Implant Date: 20130710
Implantable Lead Implant Date: 20170802
Implantable Lead Location: 753858
Implantable Lead Location: 753859
Implantable Lead Location: 753860
Implantable Pulse Generator Implant Date: 20170802
Lead Channel Impedance Value: 1064 Ohm
Lead Channel Impedance Value: 1102 Ohm
Lead Channel Impedance Value: 1121 Ohm
Lead Channel Impedance Value: 342 Ohm
Lead Channel Impedance Value: 361 Ohm
Lead Channel Impedance Value: 399 Ohm
Lead Channel Impedance Value: 418 Ohm
Lead Channel Impedance Value: 437 Ohm
Lead Channel Impedance Value: 475 Ohm
Lead Channel Impedance Value: 513 Ohm
Lead Channel Impedance Value: 722 Ohm
Lead Channel Impedance Value: 779 Ohm
Lead Channel Impedance Value: 836 Ohm
Lead Channel Impedance Value: 855 Ohm
Lead Channel Pacing Threshold Amplitude: 0.625 V
Lead Channel Pacing Threshold Amplitude: 1.125 V
Lead Channel Pacing Threshold Amplitude: 1.375 V
Lead Channel Pacing Threshold Pulse Width: 0.4 ms
Lead Channel Pacing Threshold Pulse Width: 0.4 ms
Lead Channel Pacing Threshold Pulse Width: 0.8 ms
Lead Channel Sensing Intrinsic Amplitude: 0.375 mV
Lead Channel Sensing Intrinsic Amplitude: 0.375 mV
Lead Channel Sensing Intrinsic Amplitude: 14.25 mV
Lead Channel Sensing Intrinsic Amplitude: 14.25 mV
Lead Channel Setting Pacing Amplitude: 2 V
Lead Channel Setting Pacing Amplitude: 2.25 V
Lead Channel Setting Pacing Amplitude: 2.5 V
Lead Channel Setting Pacing Pulse Width: 0.4 ms
Lead Channel Setting Pacing Pulse Width: 1 ms
Lead Channel Setting Sensing Sensitivity: 4 mV
Zone Setting Status: 755011
Zone Setting Status: 755011

## 2024-06-22 ENCOUNTER — Ambulatory Visit: Payer: Self-pay | Admitting: Diagnostic Neuroimaging

## 2024-06-22 ENCOUNTER — Ambulatory Visit: Payer: Self-pay | Admitting: Cardiology

## 2024-06-25 ENCOUNTER — Ambulatory Visit: Payer: Self-pay | Admitting: Cardiology

## 2024-07-11 ENCOUNTER — Other Ambulatory Visit (HOSPITAL_COMMUNITY): Payer: Self-pay

## 2024-07-11 ENCOUNTER — Telehealth: Payer: Self-pay | Admitting: Pharmacy Technician

## 2024-07-11 NOTE — Telephone Encounter (Signed)
 Pharmacy Patient Advocate Encounter   Received notification from Fax that prior authorization for repatha  is required/requested.   Insurance verification completed.   The patient is insured through [employer] medic[employer] .   Per test claim: PA required; PA submitted to above mentioned insurance via Latent Key/confirmation #/EOC [id number] Status is pending

## 2024-07-11 NOTE — Telephone Encounter (Signed)
 [employer] calling to inform Repatha  is approved for 1 yr starting today's date. Will be faxing documents today as well.

## 2024-07-11 NOTE — Telephone Encounter (Signed)
 Pharmacy Patient Advocate Encounter  Received notification from [employer] that Prior Authorization for repatha  has been APPROVED from 07/11/24 to 07/11/25. Ran test claim, Copay is $45.00- one month. This test claim was processed through [facility]- copay amounts may vary at other pharmacies due to pharmacy/plan contracts, or as the patient moves through the different stages of their insurance plan.   PA #/Case ID/Reference #: [id number]

## 2024-07-12 ENCOUNTER — Ambulatory Visit (INDEPENDENT_AMBULATORY_CARE_PROVIDER_SITE_OTHER)

## 2024-07-12 ENCOUNTER — Ambulatory Visit: Payer: Medicare Other

## 2024-07-12 DIAGNOSIS — I495 Sick sinus syndrome: Secondary | ICD-10-CM | POA: Diagnosis not present

## 2024-07-13 LAB — CUP PACEART REMOTE DEVICE CHECK
Battery Remaining Longevity: 4 mo
Battery Voltage: 2.62 V
Brady Statistic AP VP Percent: 99.45 %
Brady Statistic AP VS Percent: 0.01 %
Brady Statistic AS VP Percent: 0.25 %
Brady Statistic AS VS Percent: 0.3 %
Brady Statistic RA Percent Paced: 91.54 %
Brady Statistic RV Percent Paced: 99.69 %
Date Time Interrogation Session: 20250826223743
Implantable Lead Connection Status: 753985
Implantable Lead Connection Status: 753985
Implantable Lead Connection Status: 753985
Implantable Lead Implant Date: 20130517
Implantable Lead Implant Date: 20130710
Implantable Lead Implant Date: 20170802
Implantable Lead Location: 753858
Implantable Lead Location: 753859
Implantable Lead Location: 753860
Implantable Pulse Generator Implant Date: 20170802
Lead Channel Impedance Value: 1007 Ohm
Lead Channel Impedance Value: 1026 Ohm
Lead Channel Impedance Value: 323 Ohm
Lead Channel Impedance Value: 342 Ohm
Lead Channel Impedance Value: 418 Ohm
Lead Channel Impedance Value: 418 Ohm
Lead Channel Impedance Value: 437 Ohm
Lead Channel Impedance Value: 475 Ohm
Lead Channel Impedance Value: 475 Ohm
Lead Channel Impedance Value: 627 Ohm
Lead Channel Impedance Value: 760 Ohm
Lead Channel Impedance Value: 817 Ohm
Lead Channel Impedance Value: 817 Ohm
Lead Channel Impedance Value: 969 Ohm
Lead Channel Pacing Threshold Amplitude: 0.75 V
Lead Channel Pacing Threshold Amplitude: 1.125 V
Lead Channel Pacing Threshold Amplitude: 1.375 V
Lead Channel Pacing Threshold Pulse Width: 0.4 ms
Lead Channel Pacing Threshold Pulse Width: 0.4 ms
Lead Channel Pacing Threshold Pulse Width: 0.8 ms
Lead Channel Sensing Intrinsic Amplitude: 0.25 mV
Lead Channel Sensing Intrinsic Amplitude: 0.25 mV
Lead Channel Sensing Intrinsic Amplitude: 14.25 mV
Lead Channel Sensing Intrinsic Amplitude: 14.25 mV
Lead Channel Setting Pacing Amplitude: 2 V
Lead Channel Setting Pacing Amplitude: 2.25 V
Lead Channel Setting Pacing Amplitude: 2.5 V
Lead Channel Setting Pacing Pulse Width: 0.4 ms
Lead Channel Setting Pacing Pulse Width: 1 ms
Lead Channel Setting Sensing Sensitivity: 4 mV
Zone Setting Status: 755011
Zone Setting Status: 755011

## 2024-07-17 ENCOUNTER — Encounter

## 2024-07-23 ENCOUNTER — Ambulatory Visit: Payer: Self-pay | Admitting: Cardiology

## 2024-07-31 NOTE — Progress Notes (Signed)
 Remote PPM Transmission

## 2024-08-04 ENCOUNTER — Ambulatory Visit: Attending: Cardiovascular Disease | Admitting: Cardiovascular Disease

## 2024-08-04 ENCOUNTER — Encounter: Payer: Self-pay | Admitting: Cardiovascular Disease

## 2024-08-04 VITALS — BP 130/90 | HR 71 | Ht 72.0 in | Wt 178.2 lb

## 2024-08-04 DIAGNOSIS — E782 Mixed hyperlipidemia: Secondary | ICD-10-CM | POA: Insufficient documentation

## 2024-08-04 DIAGNOSIS — I25118 Atherosclerotic heart disease of native coronary artery with other forms of angina pectoris: Secondary | ICD-10-CM | POA: Diagnosis present

## 2024-08-04 DIAGNOSIS — Z950 Presence of cardiac pacemaker: Secondary | ICD-10-CM | POA: Diagnosis present

## 2024-08-04 DIAGNOSIS — I4819 Other persistent atrial fibrillation: Secondary | ICD-10-CM | POA: Insufficient documentation

## 2024-08-04 NOTE — Assessment & Plan Note (Signed)
 Continue apixaban  for anticoagulation.  Doing well from a heart rhythm perspective.

## 2024-08-04 NOTE — Progress Notes (Signed)
 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 He has undergone multiple PCI procedures.   The patient is here alone today.  He is been doing well from a cardiac perspective.  He is had no recent anginal problems.  He denies chest pain or pressure, shortness of breath, or heart palpitations.  He was having some issues with leg edema and is wearing compression socks with good results.  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  He has seen neurology for this.  Reports no recent changes in his medications.  Reports that he is due for a generator change for his pacemaker in the near future.  He was wanting to have a brain MRI for evaluation of normal pressure hydrocephalus but he has pacemaker leads from different manufacturers and apparently this can be a concern with MRI safety.  Current Medications: Current Meds  Medication Sig   acyclovir  (ZOVIRAX ) 400 MG tablet Take 400 mg by mouth 2 (two) times daily as needed (outbreaks).   apixaban  (ELIQUIS ) 5 MG TABS tablet Take 1 tablet (5 mg total) by mouth 2 (two) times daily.   aspirin  EC 81 MG tablet Take 1 tablet (81 mg total) by mouth daily. Swallow whole.   cetirizine  (ZYRTEC ) 10 MG tablet Take 1 tablet (10 mg total) by mouth 2 (two) times daily. (Patient taking differently: Take 10 mg by mouth daily.)   Cholecalciferol  25 MCG (1000 UT) tablet Take 1,000 Units by mouth daily.   Evolocumab  (REPATHA  SURECLICK) 140 MG/ML SOAJ INJECT 1 PEN UNDER THE SKIN EVERY  14 DAYS   fluocinonide  ointment (LIDEX ) 0.05 % 1 application 1-7 times per week as needed.   losartan  (COZAAR ) 25 MG tablet Take 25 mg by mouth daily.   metroNIDAZOLE (METROGEL) 0.75 % gel Apply 1 Application topically 2 (two) times daily as needed (rosacea).   nitroGLYCERIN  (NITROSTAT ) 0.4 MG SL tablet Place 1 tablet (0.4 mg total) under the tongue every 5 (five) minutes as needed for chest pain (x 3 tabs).   propranolol  (INDERAL ) 10 MG tablet Take 10 mg by mouth 3 (three) times daily as needed (migraines / essential tremors).   Current Facility-Administered Medications for the 08/04/24 encounter (Office Visit) with Fell Ozell, MD  Medication   dupilumab  (DUPIXENT ) prefilled syringe 300 mg     Allergies:   Ace inhibitors, Amlodipine , Cefaclor, Chlorhexidine , Statins, Dupixent  [dupilumab ], and Xarelto  [rivaroxaban ]   Past Medical History:  Diagnosis Date   Atrial flutter -atypical 2007   ABLATION x2 [facility]   Atrial tachycardia (HCC) 2007   ABLATION [facility]   Benign paroxysmal vertigo    CAD (coronary artery disease)    a. abnl mv 01/2012;  b. cath 05/23/2012 LCX 80@ bifurcation w/ OM, 95% @ AV groove, otw nonobs dzs, EF 55-65%;   Carcinoma (HCC)    skin cancer off my chest   Familial tremor    First degree AV block    had 4 ablasions that  left py with heart block   History of blood transfusion 2010   2nd day after prostatectomy   Hyperlipidemia    Hypertension    Incontinence    DECREASING INCONTINENCE RELATED TO PROSTATECTOMY.   Migraines    Presence of permanent cardiac pacemaker 2013 ?   Prostate cancer (HCC)    Pulsatile tinnitus    Tachy-brady syndrome (HCC)    a. developed after RFCA @ [facility] 2013-> MDT PPM;  b. s/p RA lead revision 05/2012    ROS:   Please see the history of present illness.    All other systems reviewed and are negative.  EKGs/Labs/Other Studies Reviewed:    The following studies were reviewed today: Cardiac Studies & Procedures    ______________________________________________________________________________________________ CARDIAC CATHETERIZATION  CARDIAC CATHETERIZATION 10/21/2023  Conclusion   Dist LAD lesion is 50% stenosed.   Prox Cx to Mid Cx lesion is 10% stenosed.   Dist Cx lesion is 100% stenosed.   Dist RCA lesion is 50% stenosed.   Prox LAD to Mid LAD lesion is 40% stenosed.   Non-stenotic Prox LAD lesion was previously treated.   Non-stenotic Mid LAD to Dist LAD lesion was previously treated.   LV end diastolic pressure is normal.   The left ventricular ejection fraction is 50-55% by visual estimate.  1.  Widely patent left main with no stenosis 2.  Patent LAD with patent stents in the proximal and mid vessel, mild nonobstructive plaquing in the segment between the stents, all unchanged from the prior study 4 years earlier 3.  Patent left circumflex stent with no stenosis.  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  Moderate distal RCA stenosis, angiographically stable 5.  Normal LV function with LVEF estimated at 55% without regional wall motion abnormalities.  Recommendations: Continue medical therapy.  Resume apixaban  tomorrow morning at normal dosing schedule.  Findings Coronary Findings Diagnostic  Dominance: Right  Left Anterior Descending The LAD remains patent, with widely patent stents in the proximal and mid-vessel. The intervening segment between the stents has mild diffuse disease of 40% stenosis between the 2 stents. Non-stenotic Prox LAD lesion was previously treated. The lesion is discrete. Proximal LAD stent remains widely patent. Prox LAD to Mid LAD lesion is 40% stenosed. Non-stenotic Mid LAD to Dist LAD lesion was previously treated. The lesion is eccentric and irregular. The lesion is calcified. diffuse stenosis Dist LAD lesion is 50% stenosed.  Left Circumflex Occluded as the AV circumflex arises  from the stent site (unable to accurately depict in the diagram). The mid-circumflex stented segment remains widely patent. Collaterals Dist Cx filled by collaterals from 2nd Mrg.  Prox Cx to Mid Cx lesion is 10% stenosed. The lesion was previously treated using a drug eluting stent over 2 years ago. The AV circumflex is occluded at the stented segment. The stent is widely patent and extends into the OM which divides into 2 branches. The distal circumflex is collateralized from the OM branches. This is unchanged from the last cath study. Dist Cx lesion is 100% stenosed. chronic occlusion, unchanged from prior studies. Circumflex stent remains patent.  Right Coronary Artery There is mild diffuse disease throughout the vessel. Dist RCA lesion is 50% stenosed. Unchanged from previous cath studies  Intervention  No interventions have been documented.   CARDIAC CATHETERIZATION  CARDIAC CATHETERIZATION 12/13/2018  Conclusion  Previously placed Prox LAD drug eluting stent is widely patent.  Balloon angioplasty  was performed.  Mid LAD to Dist LAD lesion is 95% stenosed.  Dist LAD lesion is 50% stenosed.  Prox Cx to Mid Cx lesion is 10% stenosed.  Dist Cx lesion is 100% stenosed.  Dist RCA lesion is 50% stenosed.  A drug-eluting stent was successfully placed using a STENT RESOLUTE ONYX 2.5X26.  Post intervention, there is a 0% residual stenosis.  There is mild left ventricular systolic dysfunction.  The left ventricular ejection fraction is 50-55% by visual estimate.  1.  Severe mid LAD stenosis treated successfully with PCI using a 2.5 x 26 mm resolute Onyx DES, postdilated to high pressure with a 3.0 mm noncompliant balloon 2.  Continued patency of the stented segment in the proximal LAD with no significant restenosis 3.  Continued patency of the stented segment in the left circumflex with chronic occlusion of the circumflex subbranch collateralized by left to left  collaterals 4.  Moderate stenosis of the distal RCA, unchanged from previous studies, estimated at 50% 5.  Mild global LV systolic dysfunction with LVEF estimated at 50 to 55%  Recommendations: Resume apixaban  tomorrow, continue aspirin  81 mg daily x1 month, continue clopidogrel  75 mg daily x6 months.  The patient has had multiple ACS events.  If he is tolerating a combination of apixaban  and clopidogrel  without significant bleeding problems, it might be reasonable to continue longer than 6 months.  Findings Coronary Findings Diagnostic  Dominance: Right  Left Anterior Descending Previously placed Prox LAD drug eluting stent is widely patent. The lesion is discrete. Proximal LAD stent remains widely patent. Mid LAD to Dist LAD lesion is 95% stenosed. The lesion is eccentric and irregular. The lesion is calcified. diffuse stenosis Dist LAD lesion is 50% stenosed.  Left Circumflex Occluded as the AV circumflex arises from the stent site (unable to accurately depict in the diagram) Collaterals Dist Cx filled by collaterals from 2nd Mrg.  Prox Cx to Mid Cx lesion is 10% stenosed. The lesion was previously treated using a drug eluting stent over 2 years ago. The AV circumflex is occluded at the stented segment. The stent is widely patent and extends into the OM which divides into 2 branches. The distal circumflex is collateralized from the OM branches. This is unchanged from the last cath study. Dist Cx lesion is 100% stenosed. chronic occlusion, unchanged from prior studies. Circumflex stent remains patent.  Right Coronary Artery There is mild diffuse disease throughout the vessel. Dist RCA lesion is 50% stenosed. Unchanged from the previous studies in 2013 and 2018  Intervention  Mid LAD to Dist LAD lesion Stent CATHETER LAUNCHER 6FREBU 3.5 guide catheter was inserted. Lesion crossed with guidewire using a WIRE COUGAR XT STRL 190CM. Pre-stent angioplasty was performed using a BALLOON  SAPPHIRE 2.0X12. A drug-eluting stent was successfully placed using a STENT RESOLUTE ONYX 2.5X26. Post-stent angioplasty was performed using a BALLOON SAPPHIRE [place] 3.0X8. Maximum pressure:  20 atm. After stent deployment, there is no reflow in the vessel.  Intracoronary verapamil  was administered.  Flow returns to normal, TIMI-3.  Angiography is performed and the midportion of the stent appears underexpanded.  The stent had initially been postdilated with a 3.0 x 15 mm noncompliant balloon.  A 3.0 x 8 mm noncompliant balloon was used to dilate the midportion of the stent to 20 atm on 2 inflations.  This greatly improved the angiographic appearance of stent expansion throughout the stent with 0% residual stenosis and TIMI-3 flow at the completion of the procedure. Post-Intervention Lesion Assessment The  intervention was successful. Pre-interventional TIMI flow is 3. Post-intervention TIMI flow is 3. No complications occurred at this lesion. There is a 0% residual stenosis post intervention.   STRESS TESTS  MYOCARDIAL PERFUSION IMAGING 12/05/2015  Interpretation Summary  Nuclear stress EF: 47%.  This is a low risk study.  The left ventricular ejection fraction is mildly decreased (45-54%).  Low risk stress nuclear study with no ischemia or infarction; EF 47 with septal hypokinesis; mild LVE.   ECHOCARDIOGRAM  ECHOCARDIOGRAM COMPLETE 12/16/2023  Narrative ECHOCARDIOGRAM REPORT    Patient Name:   DR. NORLEEN JAYSON [name] Date of Exam: 12/16/2023 Medical Rec #:  [id number]        Height:       72.0 in Accession #:    [id number]       Weight:       181.6 lb Date of Birth:  01-01-1946         BSA:          2.045 m Patient Age:    77 years         BP:           120/84 mmHg Patient Gender: M                HR:           76 bpm. Exam Location:  [address]  Procedure: 2D Echo, Cardiac Doppler, Color Doppler and Strain Analysis  Indications:    I48.1 Persistent atrial fibrillation  History:         Patient has prior history of Echocardiogram examinations, most recent 04/22/2022. CAD, Pacemaker, Arrythmias:Atrial Flutter, Signs/Symptoms:Fatigue; Risk Factors:Hypertension and Dyslipidemia. Chest discomfort. Junctional premature beats. Atrial tachycardia. First degree AV block. Complete heart block. NICM. Bilateral ankle edema.  Sonographer:    Jon Hacker RCS Referring Phys: [phone] Catrell Morrone  IMPRESSIONS   1. Left ventricular ejection fraction, by estimation, is 60 to 65%. The left ventricle has normal function. The left ventricle has no regional wall motion abnormalities. There is moderate concentric left ventricular hypertrophy. Left ventricular diastolic parameters are indeterminate. The average left ventricular global longitudinal strain is -20.3 %. The global longitudinal strain is normal. 2. Right ventricular systolic function is normal. The right ventricular size is normal. There is normal pulmonary artery systolic pressure. 3. Left atrial size was severely dilated. 4. Right atrial size was moderately dilated. 5. The mitral valve is normal in structure. Trivial mitral valve regurgitation. No evidence of mitral stenosis. 6. Tricuspid valve regurgitation is mild to moderate. 7. The aortic valve is tricuspid. Aortic valve regurgitation is trivial. No aortic stenosis is present. 8. Aortic dilatation noted. There is borderline dilatation of the aortic root, measuring 39 mm. There is mild dilatation of the ascending aorta, measuring 41 mm. 9. The inferior vena cava is normal in size with greater than 50% respiratory variability, suggesting right atrial pressure of 3 mmHg.  FINDINGS Left Ventricle: Left ventricular ejection fraction, by estimation, is 60 to 65%. The left ventricle has normal function. The left ventricle has no regional wall motion abnormalities. The average left ventricular global longitudinal strain is -20.3 %. The global longitudinal strain is normal. The left  ventricular internal cavity size was normal in size. There is moderate concentric left ventricular hypertrophy. Left ventricular diastolic parameters are indeterminate.  Right Ventricle: The right ventricular size is normal. No increase in right ventricular wall thickness. Right ventricular systolic function is normal. There is normal pulmonary artery systolic pressure. The tricuspid regurgitant velocity  is 2.29 m/s, and with an assumed right atrial pressure of 3 mmHg, the estimated right ventricular systolic pressure is 24.0 mmHg.  Left Atrium: Left atrial size was severely dilated.  Right Atrium: Right atrial size was moderately dilated.  Pericardium: There is no evidence of pericardial effusion.  Mitral Valve: The mitral valve is normal in structure. Trivial mitral valve regurgitation. No evidence of mitral valve stenosis.  Tricuspid Valve: The tricuspid valve is normal in structure. Tricuspid valve regurgitation is mild to moderate. No evidence of tricuspid stenosis.  Aortic Valve: The aortic valve is tricuspid. Aortic valve regurgitation is trivial. No aortic stenosis is present.  Pulmonic Valve: The pulmonic valve was normal in structure. Pulmonic valve regurgitation is mild. No evidence of pulmonic stenosis.  Aorta: Aortic dilatation noted. There is borderline dilatation of the aortic root, measuring 39 mm. There is mild dilatation of the ascending aorta, measuring 41 mm.  Venous: The inferior vena cava is normal in size with greater than 50% respiratory variability, suggesting right atrial pressure of 3 mmHg.  IAS/Shunts: No atrial level shunt detected by color flow Doppler.  Additional Comments: A device lead is visualized.   LEFT VENTRICLE PLAX 2D LVIDd:         3.80 cm LVIDs:         2.50 cm   2D Longitudinal Strain LV PW:         1.40 cm   2D Strain GLS (A2C):   -21.5 % LV IVS:        1.40 cm   2D Strain GLS (A3C):   -19.6 % LVOT diam:     2.60 cm   2D Strain GLS (A4C):    -19.7 % LV SV:         87        2D Strain GLS Avg:     -20.3 % LV SV Index:   42 LVOT Area:     5.31 cm   RIGHT VENTRICLE RV Basal diam:  3.70 cm RV S prime:     10.16 cm/s TAPSE (M-mode): 0.8 cm RVSP:           24.0 mmHg  LEFT ATRIUM             Index        RIGHT ATRIUM           Index LA diam:        5.20 cm 2.54 cm/m   RA Pressure: 3.00 mmHg LA Vol (A2C):   66.9 ml 32.71 ml/m  RA Area:     26.10 cm LA Vol (A4C):   86.7 ml 42.40 ml/m  RA Volume:   78.20 ml  38.24 ml/m LA Biplane Vol: 76.4 ml 37.36 ml/m AORTIC VALVE             PULMONIC VALVE LVOT Vmax:   85.20 cm/s  PR End Diast Vel: 5.57 msec LVOT Vmean:  51.733 cm/s LVOT VTI:    0.163 m  AORTA Ao Root diam: 3.90 cm Ao Asc diam:  4.10 cm  TRICUSPID VALVE TR Peak grad:   21.0 mmHg TR Vmax:        229.00 cm/s Estimated RAP:  3.00 mmHg RVSP:           24.0 mmHg  SHUNTS Systemic VTI:  0.16 m Systemic Diam: 2.60 cm  Annabella Scarce MD Electronically signed by Annabella Scarce MD Signature Date/Time: 12/16/2023/7:48:14 PM    Final  ______________________________________________________________________________________________      EKG:        Recent Labs: 10/07/2023: BUN 18; Creatinine, Ser 0.97; Hemoglobin 13.7; Platelets 183; Potassium 5.1; Sodium 133 05/10/2024: TSH 1.470  Recent Lipid Panel    Component Value Date/Time   CHOL 134 10/16/2022 0831   TRIG 40 10/16/2022 0831   HDL 69 10/16/2022 0831   CHOLHDL 1.9 10/16/2022 0831   LDLCALC 55 10/16/2022 0831   LDLDIRECT 130 (H) 09/13/2017 1358     Risk Assessment/Calculations:    CHA2DS2-VASc Score = 3   This indicates a 3.2% annual risk of stroke. The patient's score is based upon: CHF History: 0 HTN History: 0 Diabetes History: 0 Stroke History: 0 Vascular Disease History: 1 Age Score: 2 Gender Score: 0            Physical Exam:    VS:  BP (!) 130/90   Pulse 71   Ht 6' (1.829 m)   Wt 178 lb 3.2 oz (80.8 kg)    SpO2 98%   BMI 24.17 kg/m     Wt Readings from Last 3 Encounters:  08/04/24 178 lb 3.2 oz (80.8 kg)  06/12/24 179 lb 14.4 oz (81.6 kg)  05/10/24 179 lb (81.2 kg)     GEN:  Well nourished, well developed in no acute distress HEENT: Normal NECK: No JVD; No carotid bruits LYMPHATICS: No lymphadenopathy CARDIAC: RRR, no murmurs, rubs, gallops RESPIRATORY:  Clear to auscultation without rales, wheezing or rhonchi  ABDOMEN: Soft, non-tender, non-distended MUSCULOSKELETAL:  No edema; No deformity  SKIN: Warm and dry NEUROLOGIC:  Alert and oriented x 3 PSYCHIATRIC:  Normal affect   Assessment & Plan Persistent atrial fibrillation (HCC) Continue apixaban  for anticoagulation.  Doing well from a heart rhythm perspective. Coronary artery disease of native artery of native heart with stable angina pectoris [facility]) Patient with no angina on aspirin  and evolocumab .  Continue the same. Mixed hyperlipidemia Treated with evolocumab .  Patient statin intolerant.  Recent lipids with an LDL cholesterol of 68.  Continue current management. Status post biventricular pacemaker Patient followed by EP.  Upcoming appoint with Dr. [name].  Likely will need generator change.  Will route his question about MRI safety.      Medication Adjustments/Labs and Tests Ordered: Current medicines are reviewed at length with the patient today.  Concerns regarding medicines are outlined above.  No orders of the defined types were placed in this encounter.  No orders of the defined types were placed in this encounter.   There are no Patient Instructions on file for this visit.   Signed, [name], MD  08/04/2024 12:51 PM    [facility]

## 2024-08-04 NOTE — Assessment & Plan Note (Signed)
 Patient with no angina on aspirin  and evolocumab .  Continue the same.

## 2024-08-04 NOTE — Assessment & Plan Note (Signed)
 Treated with evolocumab .  Patient statin intolerant.  Recent lipids with an LDL cholesterol of 68.  Continue current management.

## 2024-08-14 ENCOUNTER — Encounter

## 2024-08-14 LAB — CUP PACEART REMOTE DEVICE CHECK
Battery Remaining Longevity: 3 mo
Battery Voltage: 2.61 V
Brady Statistic AP VP Percent: 98.55 %
Brady Statistic AP VS Percent: 0.01 %
Brady Statistic AS VP Percent: 0.43 %
Brady Statistic AS VS Percent: 1.03 %
Brady Statistic RA Percent Paced: 91.87 %
Brady Statistic RV Percent Paced: 98.98 %
Date Time Interrogation Session: 20250928235244
Implantable Lead Connection Status: 753985
Implantable Lead Connection Status: 753985
Implantable Lead Connection Status: 753985
Implantable Lead Implant Date: 20130517
Implantable Lead Implant Date: 20130710
Implantable Lead Implant Date: 20170802
Implantable Lead Location: 753858
Implantable Lead Location: 753859
Implantable Lead Location: 753860
Implantable Pulse Generator Implant Date: 20170802
Lead Channel Impedance Value: 1064 Ohm
Lead Channel Impedance Value: 1102 Ohm
Lead Channel Impedance Value: 342 Ohm
Lead Channel Impedance Value: 361 Ohm
Lead Channel Impedance Value: 399 Ohm
Lead Channel Impedance Value: 418 Ohm
Lead Channel Impedance Value: 456 Ohm
Lead Channel Impedance Value: 456 Ohm
Lead Channel Impedance Value: 532 Ohm
Lead Channel Impedance Value: 665 Ohm
Lead Channel Impedance Value: 760 Ohm
Lead Channel Impedance Value: 855 Ohm
Lead Channel Impedance Value: 874 Ohm
Lead Channel Impedance Value: 988 Ohm
Lead Channel Pacing Threshold Amplitude: 0.75 V
Lead Channel Pacing Threshold Amplitude: 1.125 V
Lead Channel Pacing Threshold Amplitude: 1.375 V
Lead Channel Pacing Threshold Pulse Width: 0.4 ms
Lead Channel Pacing Threshold Pulse Width: 0.4 ms
Lead Channel Pacing Threshold Pulse Width: 0.8 ms
Lead Channel Sensing Intrinsic Amplitude: 0.375 mV
Lead Channel Sensing Intrinsic Amplitude: 0.375 mV
Lead Channel Sensing Intrinsic Amplitude: 14.25 mV
Lead Channel Sensing Intrinsic Amplitude: 14.25 mV
Lead Channel Setting Pacing Amplitude: 2 V
Lead Channel Setting Pacing Amplitude: 2.25 V
Lead Channel Setting Pacing Amplitude: 2.5 V
Lead Channel Setting Pacing Pulse Width: 0.4 ms
Lead Channel Setting Pacing Pulse Width: 1 ms
Lead Channel Setting Sensing Sensitivity: 4 mV
Zone Setting Status: 755011
Zone Setting Status: 755011

## 2024-08-16 NOTE — Progress Notes (Signed)
 Remote PPM Transmission

## 2024-08-17 ENCOUNTER — Encounter

## 2024-08-30 ENCOUNTER — Ambulatory Visit: Admitting: Cardiovascular Disease

## 2024-09-09 ENCOUNTER — Ambulatory Visit: Payer: Self-pay | Admitting: Cardiology

## 2024-09-13 NOTE — Addendum Note (Signed)
 Addended by: VICCI SELLER A on: 09/13/2024 09:38 AM   Modules accepted: Orders, Level of Service

## 2024-09-13 NOTE — Progress Notes (Signed)
 Remote pacemaker transmission.

## 2024-09-14 ENCOUNTER — Ambulatory Visit

## 2024-09-15 ENCOUNTER — Telehealth: Payer: Self-pay | Admitting: Cardiology

## 2024-09-15 LAB — CUP PACEART REMOTE DEVICE CHECK
Battery Remaining Longevity: 2 mo
Battery Voltage: 2.61 V
Brady Statistic AP VP Percent: 98.66 %
Brady Statistic AP VS Percent: 0 %
Brady Statistic AS VP Percent: 0.72 %
Brady Statistic AS VS Percent: 0.62 %
Brady Statistic RA Percent Paced: 98.7 %
Brady Statistic RV Percent Paced: 99.38 %
Date Time Interrogation Session: 20251029235112
Implantable Lead Connection Status: 753985
Implantable Lead Connection Status: 753985
Implantable Lead Connection Status: 753985
Implantable Lead Implant Date: 20130517
Implantable Lead Implant Date: 20130710
Implantable Lead Implant Date: 20170802
Implantable Lead Location: 753858
Implantable Lead Location: 753859
Implantable Lead Location: 753860
Implantable Pulse Generator Implant Date: 20170802
Lead Channel Impedance Value: 1045 Ohm
Lead Channel Impedance Value: 1102 Ohm
Lead Channel Impedance Value: 1197 Ohm
Lead Channel Impedance Value: 342 Ohm
Lead Channel Impedance Value: 361 Ohm
Lead Channel Impedance Value: 399 Ohm
Lead Channel Impedance Value: 418 Ohm
Lead Channel Impedance Value: 437 Ohm
Lead Channel Impedance Value: 456 Ohm
Lead Channel Impedance Value: 570 Ohm
Lead Channel Impedance Value: 703 Ohm
Lead Channel Impedance Value: 760 Ohm
Lead Channel Impedance Value: 893 Ohm
Lead Channel Impedance Value: 912 Ohm
Lead Channel Pacing Threshold Amplitude: 0.75 V
Lead Channel Pacing Threshold Amplitude: 1.125 V
Lead Channel Pacing Threshold Amplitude: 1.375 V
Lead Channel Pacing Threshold Pulse Width: 0.4 ms
Lead Channel Pacing Threshold Pulse Width: 0.4 ms
Lead Channel Pacing Threshold Pulse Width: 0.8 ms
Lead Channel Sensing Intrinsic Amplitude: 0.375 mV
Lead Channel Sensing Intrinsic Amplitude: 0.375 mV
Lead Channel Sensing Intrinsic Amplitude: 14.25 mV
Lead Channel Sensing Intrinsic Amplitude: 14.25 mV
Lead Channel Setting Pacing Amplitude: 2 V
Lead Channel Setting Pacing Amplitude: 2.25 V
Lead Channel Setting Pacing Amplitude: 2.5 V
Lead Channel Setting Pacing Pulse Width: 0.4 ms
Lead Channel Setting Pacing Pulse Width: 1 ms
Lead Channel Setting Sensing Sensitivity: 4 mV
Zone Setting Status: 755011
Zone Setting Status: 755011

## 2024-09-15 NOTE — Telephone Encounter (Signed)
 Patient stated his most recent pacemaker check shows he has 2 months left on his battery.  Patient wants a call back to arrange the battery replacement.

## 2024-09-15 NOTE — Telephone Encounter (Signed)
 Spoke with pt and advised insurance prevent scheduling generator change prior to device reaching ERI.  Pt reassured with monthly monitoring he will be notified once this happens and then there will still be a 3 month period to have generator replaced.  Pt is currently scheduled to see Dr [name] 10/27/24.  Pt verbalizes understanding and thanked CHARITY FUNDRAISER for the callback.

## 2024-09-17 ENCOUNTER — Ambulatory Visit: Payer: Self-pay | Admitting: Cardiology

## 2024-09-18 ENCOUNTER — Encounter

## 2024-10-11 ENCOUNTER — Ambulatory Visit: Payer: Medicare Other

## 2024-10-11 ENCOUNTER — Encounter

## 2024-10-15 ENCOUNTER — Ambulatory Visit

## 2024-10-15 DIAGNOSIS — I4819 Other persistent atrial fibrillation: Secondary | ICD-10-CM

## 2024-10-16 ENCOUNTER — Ambulatory Visit: Payer: Self-pay | Admitting: Cardiology

## 2024-10-16 LAB — CUP PACEART REMOTE DEVICE CHECK
Battery Remaining Longevity: 1 mo
Battery Voltage: 2.61 V
Brady Statistic AP VP Percent: 98.81 %
Brady Statistic AP VS Percent: 0 %
Brady Statistic AS VP Percent: 1.11 %
Brady Statistic AS VS Percent: 0.08 %
Brady Statistic RA Percent Paced: 97.79 %
Brady Statistic RV Percent Paced: 99.92 %
Date Time Interrogation Session: 20251129224948
Implantable Lead Connection Status: 753985
Implantable Lead Connection Status: 753985
Implantable Lead Connection Status: 753985
Implantable Lead Implant Date: 20130517
Implantable Lead Implant Date: 20130710
Implantable Lead Implant Date: 20170802
Implantable Lead Location: 753858
Implantable Lead Location: 753859
Implantable Lead Location: 753860
Implantable Pulse Generator Implant Date: 20170802
Lead Channel Impedance Value: 1083 Ohm
Lead Channel Impedance Value: 323 Ohm
Lead Channel Impedance Value: 342 Ohm
Lead Channel Impedance Value: 399 Ohm
Lead Channel Impedance Value: 418 Ohm
Lead Channel Impedance Value: 437 Ohm
Lead Channel Impedance Value: 456 Ohm
Lead Channel Impedance Value: 570 Ohm
Lead Channel Impedance Value: 627 Ohm
Lead Channel Impedance Value: 760 Ohm
Lead Channel Impedance Value: 893 Ohm
Lead Channel Impedance Value: 893 Ohm
Lead Channel Impedance Value: 950 Ohm
Lead Channel Impedance Value: 988 Ohm
Lead Channel Pacing Threshold Amplitude: 0.875 V
Lead Channel Pacing Threshold Amplitude: 1.125 V
Lead Channel Pacing Threshold Amplitude: 1.375 V
Lead Channel Pacing Threshold Pulse Width: 0.4 ms
Lead Channel Pacing Threshold Pulse Width: 0.4 ms
Lead Channel Pacing Threshold Pulse Width: 0.8 ms
Lead Channel Sensing Intrinsic Amplitude: 0.25 mV
Lead Channel Sensing Intrinsic Amplitude: 0.25 mV
Lead Channel Sensing Intrinsic Amplitude: 14.25 mV
Lead Channel Sensing Intrinsic Amplitude: 14.25 mV
Lead Channel Setting Pacing Amplitude: 2 V
Lead Channel Setting Pacing Amplitude: 2.25 V
Lead Channel Setting Pacing Amplitude: 2.5 V
Lead Channel Setting Pacing Pulse Width: 0.4 ms
Lead Channel Setting Pacing Pulse Width: 1 ms
Lead Channel Setting Sensing Sensitivity: 4 mV
Zone Setting Status: 755011
Zone Setting Status: 755011

## 2024-10-18 NOTE — Progress Notes (Signed)
 Remote PPM Transmission

## 2024-10-19 ENCOUNTER — Encounter

## 2024-10-26 NOTE — Progress Notes (Unsigned)
 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  Discussed the use of AI scribe software for clinical note transcription with the patient, who gave verbal consent to proceed.  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  He has a pacemaker defibrillator nearing the elective replacement interval (ERI) for its battery, expected to reach this point in less than a month. Once the ERI is reached, he will have approximately three months to replace the battery. He has experienced a single brief episode of non-sustained ventricular tachycardia (NSVT) but otherwise has had stable device function.  He has a history of requiring a second lead for his pacemaker after experiencing a decline in cardiac function with a single lead, which improved after the upgrade. This issue did not arise until about ten years after the initial placement.  He has undergone multiple cardiac ablations, initially at [facility] and later at [facility], for arrhythmias that began with an ectopic pacemaker approximately fifteen years ago. 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  Patient reports doing relatively well.  No new or acute complaints today.   Review of systems complete and found to be negative unless listed in HPI.   EP Information / Studies Reviewed:    EKG is not ordered today. EKG from 06/12/24 reviewed which showed AP-BiV paced.      Echo 12/16/23:   1. Left  ventricular ejection fraction, by estimation, is 60 to 65%. The  left ventricle has normal function. The left ventricle has no regional  wall motion abnormalities. There is moderate concentric left ventricular  hypertrophy. Left ventricular  diastolic parameters are indeterminate. The average left ventricular  global longitudinal strain is -20.3 %. The global longitudinal strain is  normal.   2. Right ventricular systolic function is normal. The right ventricular  size is normal. There is normal pulmonary artery systolic pressure.   3. Left atrial size was severely dilated.   4. Right atrial size was moderately dilated.   5. The mitral valve is normal in structure. Trivial mitral valve  regurgitation. No evidence of mitral stenosis.   6. Tricuspid valve regurgitation is mild to moderate.   7. The aortic valve is tricuspid. Aortic valve regurgitation is trivial.  No aortic stenosis is present.   8. Aortic dilatation noted. There is borderline dilatation of the aortic  root, measuring 39 mm. There is mild dilatation of the ascending aorta,  measuring 41 mm.   9. The inferior vena cava is normal in size with greater than 50%  respiratory variability, suggesting right atrial pressure of 3 mmHg.   Risk Assessment/Calculations:    CHA2DS2-VASc Score = 3   This indicates a 3.2% annual risk of stroke. The patient's score is based upon: CHF History: 0 HTN History: 0 Diabetes History: 0 Stroke History: 0 Vascular Disease History: 1 Age Score: 2 Gender Score: 0         Physical Exam:   VS:  BP (!) 152/100 (BP Location: Left Arm, Patient Position: Sitting, Cuff Size: Normal)   Pulse 76   Ht 6' (1.829 m)   Wt 177 lb (80.3 kg)   SpO2 97%   BMI 24.01 kg/m    Wt Readings from Last 3 Encounters:  10/27/24 177 lb (80.3 kg)  08/04/24 178 lb 3.2 oz (80.8 kg)  06/12/24 179 lb 14.4 oz (81.6 kg)     GEN: Well nourished, well developed in no acute distress NECK: No JVD CARDIAC: Normal  rate, regular rhythm.  Well-healed pacer pocket. RESPIRATORY:  Clear to auscultation without rales, wheezing or rhonchi  ABDOMEN: Soft, non-distended EXTREMITIES:  No edema; No deformity   ASSESSMENT AND PLAN:    CHB s/p CRT-P - In-clinic device interrogation was performed today.  Appropriate device function and stable lead parameters.  Device is less than 1 month from ERI.  Continue monthly remote monitoring. -Risks, benefits, and alternatives to ICD pulse generator replacement were discussed in detail today.  The patient understands these risks and wishes to proceed.  We will therefore schedule the procedure at the next available time.  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 -Monitor burden with device.   CAD: S/p multiple PCIs.  Denies chest pain. -Continue follow-up with Dr. Wonda.   Follow up with Dr. [name] for generator change.   Signed, Fonda [name], MD

## 2024-10-27 ENCOUNTER — Encounter: Payer: Self-pay | Admitting: Cardiology

## 2024-10-27 ENCOUNTER — Ambulatory Visit: Attending: Cardiology | Admitting: Cardiology

## 2024-10-27 VITALS — BP 152/100 | HR 76 | Ht 72.0 in | Wt 177.0 lb

## 2024-10-27 DIAGNOSIS — I4892 Unspecified atrial flutter: Secondary | ICD-10-CM | POA: Diagnosis not present

## 2024-10-27 DIAGNOSIS — D6869 Other thrombophilia: Secondary | ICD-10-CM | POA: Diagnosis present

## 2024-10-27 DIAGNOSIS — I442 Atrioventricular block, complete: Secondary | ICD-10-CM

## 2024-10-27 DIAGNOSIS — Z950 Presence of cardiac pacemaker: Secondary | ICD-10-CM | POA: Diagnosis present

## 2024-10-27 DIAGNOSIS — I2510 Atherosclerotic heart disease of native coronary artery without angina pectoris: Secondary | ICD-10-CM | POA: Diagnosis not present

## 2024-10-27 DIAGNOSIS — I4891 Unspecified atrial fibrillation: Secondary | ICD-10-CM

## 2024-10-27 LAB — CUP PACEART INCLINIC DEVICE CHECK
Date Time Interrogation Session: 20251212094214
Implantable Lead Connection Status: 753985
Implantable Lead Connection Status: 753985
Implantable Lead Connection Status: 753985
Implantable Lead Implant Date: 20130517
Implantable Lead Implant Date: 20130710
Implantable Lead Implant Date: 20170802
Implantable Lead Location: 753858
Implantable Lead Location: 753859
Implantable Lead Location: 753860
Implantable Pulse Generator Implant Date: 20170802

## 2024-10-27 NOTE — Patient Instructions (Signed)
 Medication Instructions:  Your physician recommends that you continue on your current medications as directed. Please refer to the Current Medication list given to you today.  *If you need a refill on your cardiac medications before your next appointment, please call your pharmacy*  Testing/Procedures: Pacemaker Generator Change - we will call you to schedule once your battery hits replacement time.  Follow-Up: At [facility], you and your health needs are our priority.  As part of our continuing mission to provide you with exceptional heart care, our providers are all part of one team.  This team includes your primary Cardiologist (physician) and Advanced Practice Providers or APPs (Physician Assistants and Nurse Practitioners) who all work together to provide you with the care you need, when you need it.

## 2024-11-01 ENCOUNTER — Ambulatory Visit: Payer: Self-pay | Admitting: Cardiology

## 2024-11-02 ENCOUNTER — Telehealth: Payer: Self-pay | Admitting: Cardiology

## 2024-11-02 NOTE — Telephone Encounter (Signed)
 Pt c/o BP issue: STAT if pt c/o blurred vision, one-sided weakness or slurred speech  1. What are your last 5 BP readings? 124/94 about 30 mins ago but pt stated it's been around 160/94 the last 2 days.   2. Are you having any other symptoms (ex. Dizziness, headache, blurred vision, passed out)? Pt stated it's just an unpleasant feeling.   3. What is your BP issue? He has concerns due to his pacemaker battery getting low and he wants to make sure this doesn't tie into what's going on. He's requesting a callback from the nurse. Please advise

## 2024-11-02 NOTE — Telephone Encounter (Signed)
 Patient called back after reviewing transmission  No new episodes  AF burden 0%   PVC burden 0%  No anomalies detected   Patient appreciative of call back with transmission findings

## 2024-11-02 NOTE — Telephone Encounter (Signed)
 Pt understands his device has not triggered ERI yet.  He thinks he may have been having some fib/flutter the past few days.  Advised to send in a transmission and will forward to device clinic for review of transmission and to follow up with findings.

## 2024-11-12 ENCOUNTER — Encounter (HOSPITAL_COMMUNITY): Payer: Self-pay

## 2024-11-12 ENCOUNTER — Ambulatory Visit (HOSPITAL_COMMUNITY)
Admission: EM | Admit: 2024-11-12 | Discharge: 2024-11-12 | Disposition: A | Discharge status: Home / Self Care | Attending: Family Medicine | Admitting: Family Medicine

## 2024-11-12 ENCOUNTER — Other Ambulatory Visit: Payer: Self-pay

## 2024-11-12 ENCOUNTER — Telehealth: Payer: Self-pay | Admitting: Cardiology

## 2024-11-12 DIAGNOSIS — I10 Essential (primary) hypertension: Secondary | ICD-10-CM | POA: Diagnosis not present

## 2024-11-12 MED ORDER — AMLODIPINE BESYLATE 5 MG PO TABS: 5.0000 mg | ORAL_TABLET | Freq: Every day | ORAL | 3 refills | Status: DC

## 2024-11-12 MED ORDER — LOSARTAN POTASSIUM 50 MG PO TABS: 50.0000 mg | ORAL_TABLET | Freq: Every day | ORAL | 3 refills | Status: AC

## 2024-11-12 NOTE — Telephone Encounter (Signed)
 Outpatient service line:  Patient called today concerned about his blood pressure.  Reports at home he is having measurements between 130-160 systolics.  Went to the urgent care today concerned about his blood pressure but has no associated symptoms or any other acute complaints today.  He would like something new to start.  Prescribing amlodipine  5 mg to be taken once a day.  He is already on losartan  50 mg, but [facility] 25 mg tablets that he has been taking 2 of so we will just consolidate to 50 mg tablet.  Additionally, he reports there are multiple allergies listed on his chart that are not accurate, amlodipine  being one of them.  He has had no intolerances to the past and wants to try it so we will start.  He will log his blood pressure for the next 1 to 2 weeks or so until he is seen in follow-up.  Scheduling can you please arrange follow-up for him to be seen in about 2 weeks for follow-up of his blood pressure, thanks.

## 2024-11-12 NOTE — ED Triage Notes (Signed)
 Patient has been on low dose losartan .  A couple of weeks ago, blood pressure started climbing.  SABRA  PCP started a double dose of losartan  on 12/24.  Am readings improved, but evening blood pressures climbed.

## 2024-11-12 NOTE — Discharge Instructions (Addendum)
 Your blood pressure reading is elevated in clinic today.  Please continue to take your losartan  as prescribed.  Please follow-up with your cardiologist and primary care provider tomorrow morning.  If you develop red flag symptoms such as slurred speech, vision changes, dizziness, numbness, tingling, weakness, chest pain, shortness of breath please go to the emergency room.

## 2024-11-12 NOTE — ED Provider Notes (Signed)
 " [facility]    CSN: [id number] Arrival date & time: 11/12/24  0820      History   Chief Complaint No chief complaint on file.   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   This 78 year old male is being seen for concerns of hypertension.  He reports his blood pressure has been increasing over the last several weeks.  He has been in contact with his cardiologist and his primary care provider.  He reports approximately 2 weeks ago his losartan  was increased from 25 mg a day to 50 mg/day.  He keeps a blood pressure log, and his blood pressures continue to be elevated in the afternoons.  He reports when his blood pressure gets high in the afternoons, he develops a headache.  He denies dizziness, vision changes.  He denies chest pain, shortness of breath.    Past Medical History:  Diagnosis Date   Atrial flutter -atypical 2007   ABLATION x2 [facility]   Atrial tachycardia 2007   ABLATION [facility]   Benign paroxysmal vertigo    CAD (coronary artery disease)    a. abnl mv 01/2012;  b. cath 05/23/2012 LCX 80@ bifurcation w/ OM, 95% @ AV groove, otw nonobs dzs, EF 55-65%;   Carcinoma (HCC)    skin cancer off my chest   Familial tremor    First degree AV block    had 4 ablasions that left py with heart block   History of blood transfusion 2010   2nd day after prostatectomy   Hyperlipidemia    Hypertension    Incontinence    DECREASING INCONTINENCE RELATED TO PROSTATECTOMY.   Migraines    Presence of permanent cardiac pacemaker 2013 ?   Prostate cancer (HCC)    Pulsatile tinnitus    Tachy-brady syndrome (HCC)    a. developed after RFCA @ [facility] 2013-> MDT PPM;  b. s/p RA lead revision 05/2012    Patient Active Problem List   Diagnosis Date Noted   Junctional premature beats (HCC) 05/29/2022   Unstable angina (HCC) 12/13/2018   Family history of colon cancer    Rectal polyp    Cardiomyopathy, nonischemic (HCC) 06/17/2016   Malignant tumor of prostate (HCC) 05/11/2016    Dyspnea on exertion 11/25/2012   Hyponatremia 09/06/2012   CAD (coronary artery disease) 05/26/2012   Biventricular cardiac pacemaker in situ 04/15/2012   Benign positional vertigo 03/31/2012   First degree AV block 03/31/2012   Persistent atrial fibrillation (HCC) 03/31/2012   Sinus node dysfunction (HCC) 03/31/2012   Complete heart block (HCC)    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   Stress incontinence 11/02/2011   Hyperlipidemia    Pulsatile tinnitus    Atrial flutter/fibrillation    Familial tremor    Atrial tachycardia (HCC)    Hypertension     Past Surgical History:  Procedure Laterality Date   ATRIAL ABLATION SURGERY  04/01/12   4th time I had one   CARDIAC CATHETERIZATION     CARDIOVERSION  12/03/2011   Procedure: CARDIOVERSION;  Surgeon: Elspeth JAYSON Sage, MD;  Location: [facility] OR;  Service: Cardiovascular;  Laterality: [name];   CARDIOVERSION  01/13/2012   Procedure: CARDIOVERSION;  Surgeon: Redell GORMAN Shallow, MD;  Location: [facility] OR;  Service: Cardiovascular;  Laterality: [name];   CARDIOVERSION  04/27/2012   Procedure: CARDIOVERSION;  Surgeon: Debby Gallop, MD;  Location: [facility] OR;  Service: Cardiovascular;  Laterality: [name];   COLONOSCOPY     COLONOSCOPY WITH PROPOFOL   [name] 10/29/2016   Procedure: COLONOSCOPY WITH PROPOFOL ;  Surgeon: Victory LITTIE Legrand DOUGLAS, MD;  Location: [facility] END[facility]COPY;  Service: Gastroenterology;  Laterality: [name];   CORONARY ANGIOPLASTY WITH STENT PLACEMENT  06/02/12   1; first one ever   CORONARY STENT INTERVENTION [name] 07/21/2017   Procedure: CORONARY STENT INTERVENTION;  Surgeon: Wonda Sharper, MD;  Location: [facility] INVASIVE CV LAB;  Service: Cardiovascular;  Laterality: [name];   CORONARY STENT INTERVENTION [name] 12/13/2018   Procedure: CORONARY STENT INTERVENTION;  Surgeon: Wonda Sharper, MD;  Location: [facility] INVASIVE CV LAB;  Service: Cardiovascular;  Laterality: [name];   EP IMPLANTABLE DEVICE [name] 06/17/2016   Procedure: BiV Pacemaker upgrade;  Surgeon: Elspeth JAYSON Sage, MD;  Location: [facility] INVASIVE CV LAB;  Service: Cardiovascular;  Laterality: [name];   HERNIA REPAIR  1980's   INGUINAL HERNIA REPAIR  1945   left side   INSERT / REPLACE / REMOVE PACEMAKER  03/2012   initial placement   INSERT / REPLACE / REMOVE PACEMAKER  05/25/12   atrial lead change   LEAD REVISION [name] 05/25/2012   Procedure: LEAD REVISION;  Surgeon: Danelle LELON Birmingham, MD;  Location: [facility] CATH LAB;  Service: Cardiovascular;  Laterality: [name];   LEFT HEART CATH AND CORONARY ANGIOGRAPHY [name] 07/21/2017   Procedure: LEFT HEART CATH AND CORONARY ANGIOGRAPHY;  Surgeon: Wonda Sharper, MD;  Location: [facility] INVASIVE CV LAB;  Service: Cardiovascular;  Laterality: [name];   LEFT HEART CATH AND CORONARY ANGIOGRAPHY [name] 12/13/2018   Procedure: LEFT HEART CATH AND CORONARY ANGIOGRAPHY;  Surgeon: Wonda Sharper, MD;  Location: [facility] INVASIVE CV LAB;  Service: Cardiovascular;  Laterality: [name];   LEFT HEART CATH AND CORONARY ANGIOGRAPHY [name] 10/21/2023   Procedure: LEFT HEART CATH AND CORONARY ANGIOGRAPHY;  Surgeon: Wonda Sharper, MD;  Location: [facility] INVASIVE CV LAB;  Service: Cardiovascular;  Laterality: [name];   PERCUTANEOUS CORONARY STENT INTERVENTION (PCI-S) [name] 06/02/2012   Procedure: PERCUTANEOUS CORONARY STENT INTERVENTION (PCI-S);  Surgeon: Debby JONETTA Como, MD;  Location: [facility] CATH LAB;  Service: Cardiovascular;  Laterality: [name];   PROSTATECTOMY  02/20/2009   COMPLICATED   SHOULDER SURGERY     Bilateral; boney spurs removed   SKIN CANCER EXCISION     in situ; right chest   TONSILLECTOMY AND ADENOIDECTOMY     as a child       Home Medications    Prior to Admission medications  Medication Sig Start Date End Date Taking? Authorizing Provider  acyclovir  (ZOVIRAX ) 400 MG tablet Take 400 mg by mouth 2 (two) times daily as needed (outbreaks). 07/01/20   [provider]  apixaban  (ELIQUIS ) 5 MG TABS tablet Take 1 tablet (5 mg total) by mouth 2 (two) times daily. 11/15/18   Sage Elspeth JAYSON, MD  aspirin   EC 81 MG tablet Take 1 tablet (81 mg total) by mouth daily. Swallow whole. 04/29/20   Wonda Sharper, MD  cetirizine  (ZYRTEC ) 10 MG tablet Take 1 tablet (10 mg total) by mouth 2 (two) times daily. Patient taking differently: Take 10 mg by mouth daily. 04/21/22   Kozlow, Camellia PARAS, MD  Cholecalciferol  25 MCG (1000 UT) tablet Take 1,000 Units by mouth daily. 11/05/21   [provider]  Evolocumab  (REPATHA  SURECLICK) 140 MG/ML SOAJ INJECT 1 PEN UNDER THE SKIN EVERY 14 DAYS 04/24/24   Wonda Sharper, MD  fluocinonide  ointment (LIDEX ) 0.05 % 1 application 1-7 times per week as needed. 04/06/23   Kozlow, Camellia PARAS, MD  losartan  (COZAAR ) 25 MG tablet Take 50 mg by mouth daily.  [provider]  metroNIDAZOLE (METROGEL) 0.75 % gel Apply 1 Application topically 2 (two) times daily as needed (rosacea).    [provider]  nitroGLYCERIN  (NITROSTAT ) 0.4 MG SL tablet Place 1 tablet (0.4 mg total) under the tongue every 5 (five) minutes as needed for chest pain (x 3 tabs). 10/07/23   Lesia Ozell Barter, PA-C  propranolol  (INDERAL ) 10 MG tablet Take 10 mg by mouth 3 (three) times daily as needed (migraines / essential tremors). 10/17/20   [provider]    Family History Family History  Problem Relation Age of Onset   Heart disease Father    Colon cancer Sister    Heart disease Brother        only one brother   Diabetes Brother        only one brother   Hydrocephalus Brother    Colon polyps Neg Hx    Rectal cancer Neg Hx    Stomach cancer Neg Hx    Esophageal cancer Neg Hx     Social History Social History[1]   Allergies   Ace inhibitors, Amlodipine , Cefaclor, Chlorhexidine , Statins, and Dupixent  [dupilumab ]   Review of Systems Review of Systems  Constitutional:  Negative for activity change.  Eyes:  Negative for visual disturbance.  Respiratory:  Negative for shortness of breath.   Cardiovascular:  Negative for chest pain and palpitations.  Skin:  Negative  for color change.  Neurological:  Positive for headaches. Negative for dizziness.  All other systems reviewed and are negative.    Physical Exam Triage Vital Signs ED Triage Vitals  Encounter Vitals Group     BP      Girls Systolic BP Percentile      Girls Diastolic BP Percentile      Boys Systolic BP Percentile      Boys Diastolic BP Percentile      Pulse      Resp      Temp      Temp src      SpO2      Weight      Height      Head Circumference      Peak Flow      Pain Score      Pain Loc      Pain Education      Exclude from Growth Chart    No data found.  Updated Vital Signs BP (!) 157/97 (BP Location: Right Arm)   Pulse 73   Temp 98.6 F (37 C) (Oral)   Resp 18   SpO2 97%   Visual Acuity Right Eye Distance:   Left Eye Distance:   Bilateral Distance:    Right Eye Near:   Left Eye Near:    Bilateral Near:     Physical Exam Vitals and nursing note reviewed.  Constitutional:      General: He is not in acute distress.    Appearance: He is well-developed. He is not ill-appearing or toxic-appearing.     Comments: Pleasant male appearing stated age found sitting in chair in no acute distress.  HENT:     Head: Normocephalic and atraumatic.  Eyes:     Conjunctiva/sclera: Conjunctivae normal.  Cardiovascular:     Rate and Rhythm: Normal rate and regular rhythm.     Heart sounds: Normal heart sounds. No murmur heard. Pulmonary:     Effort: Pulmonary effort is normal. No respiratory distress.     Breath sounds: Normal breath sounds.  Abdominal:  Palpations: Abdomen is soft.     Tenderness: There is no abdominal tenderness.  Skin:    General: Skin is warm and dry.  Neurological:     Mental Status: He is alert and oriented to person, place, and time.  Psychiatric:        Mood and Affect: Mood normal.        Speech: Speech normal.      UC Treatments / Results  Labs (all labs ordered are listed, but only abnormal results are displayed) Labs  Reviewed - No data to display  EKG   Radiology No results found.  Procedures Procedures (including critical care time)  Medications Ordered in UC Medications - No data to display  Initial Impression / Assessment and Plan / UC Course  I have reviewed the triage vital signs and the nursing notes.  Pertinent labs & imaging results that were available during my care of the patient were reviewed by me and considered in my medical decision making (see chart for details).     Vitals and triage reviewed, patient is hemodynamically stable.  Low concern for hypertensive urgency.  Patient advised to continue losartan  as prescribed.  Patient advised to follow-up with cardiology and PCP in the morning.  Patient advised if he develops numbness, tingling, weakness, vision changes, slurred speech, chest pain, shortness of breath then he should proceed to the emergency department immediately.  Plan of care, follow-up care, return precautions given, no questions at this time. Final Clinical Impressions(s) / UC Diagnoses   Final diagnoses:  Hypertension, unspecified type     Discharge Instructions      Your blood pressure reading is elevated in clinic today.  Please continue to take your losartan  as prescribed.  Please follow-up with your cardiologist and primary care provider tomorrow morning.  If you develop any new or worsening symptoms or if your symptoms do not start to improve, please follow-up with your primary care provider.  If your symptoms are severe, please go to the emergency room.       ED Prescriptions   None    PDMP not reviewed this encounter.     [1]  Social History Tobacco Use   Smoking status: Never   Smokeless tobacco: Never  Vaping Use   Vaping status: Never Used  Substance Use Topics   Alcohol use: Yes    Alcohol/week: 3.0 standard drinks of alcohol    Types: 3 Standard drinks or equivalent per week    Comment: drinks alcohol at least once every 3  months 05/25/12 last alcohol 02/2012   Drug use: No     Lennice Jon BROCKS, FNP 11/12/24 1041  "

## 2024-11-15 ENCOUNTER — Ambulatory Visit: Attending: Cardiology

## 2024-11-17 LAB — CUP PACEART REMOTE DEVICE CHECK
Battery Remaining Longevity: 1 mo — CL
Battery Voltage: 2.61 V
Brady Statistic AP VP Percent: 95.56 %
Brady Statistic AP VS Percent: 0 %
Brady Statistic AS VP Percent: 4.39 %
Brady Statistic AS VS Percent: 0.06 %
Brady Statistic RA Percent Paced: 95.48 %
Brady Statistic RV Percent Paced: 99.94 %
Date Time Interrogation Session: 20251230224722
Implantable Lead Connection Status: 753985
Implantable Lead Connection Status: 753985
Implantable Lead Connection Status: 753985
Implantable Lead Implant Date: 20130517
Implantable Lead Implant Date: 20130710
Implantable Lead Implant Date: 20170802
Implantable Lead Location: 753858
Implantable Lead Location: 753859
Implantable Lead Location: 753860
Implantable Pulse Generator Implant Date: 20170802
Lead Channel Impedance Value: 1026 Ohm
Lead Channel Impedance Value: 1121 Ohm
Lead Channel Impedance Value: 323 Ohm
Lead Channel Impedance Value: 323 Ohm
Lead Channel Impedance Value: 399 Ohm
Lead Channel Impedance Value: 399 Ohm
Lead Channel Impedance Value: 418 Ohm
Lead Channel Impedance Value: 456 Ohm
Lead Channel Impedance Value: 570 Ohm
Lead Channel Impedance Value: 665 Ohm
Lead Channel Impedance Value: 760 Ohm
Lead Channel Impedance Value: 893 Ohm
Lead Channel Impedance Value: 893 Ohm
Lead Channel Impedance Value: 988 Ohm
Lead Channel Pacing Threshold Amplitude: 0.875 V
Lead Channel Pacing Threshold Amplitude: 1.125 V
Lead Channel Pacing Threshold Amplitude: 1.375 V
Lead Channel Pacing Threshold Pulse Width: 0.4 ms
Lead Channel Pacing Threshold Pulse Width: 0.4 ms
Lead Channel Pacing Threshold Pulse Width: 0.8 ms
Lead Channel Sensing Intrinsic Amplitude: 0.25 mV
Lead Channel Sensing Intrinsic Amplitude: 0.25 mV
Lead Channel Sensing Intrinsic Amplitude: 14.25 mV
Lead Channel Sensing Intrinsic Amplitude: 14.25 mV
Lead Channel Setting Pacing Amplitude: 2 V
Lead Channel Setting Pacing Amplitude: 2.25 V
Lead Channel Setting Pacing Amplitude: 2.5 V
Lead Channel Setting Pacing Pulse Width: 0.4 ms
Lead Channel Setting Pacing Pulse Width: 1 ms
Lead Channel Setting Sensing Sensitivity: 4 mV
Zone Setting Status: 755011
Zone Setting Status: 755011

## 2024-11-18 ENCOUNTER — Ambulatory Visit: Payer: Self-pay | Admitting: Cardiology

## 2024-11-20 ENCOUNTER — Telehealth: Payer: Self-pay | Admitting: Cardiovascular Disease

## 2024-11-20 NOTE — Telephone Encounter (Signed)
 Spoke to patient he stated he is concerned his B/P has been elevated the past 1 month.Stated he started taking Losartan  50 mg daily on 12/24.He started taking Amlodipine  5 mg every pm on 12/29.B/P this morning 138/96 Pulse 74. B/P has been ranging 150 to160 /92. Appointment scheduled with Josefa Beauvais NP 1/14 at 8:00 am.Advised to bring a list of all medications and B/P readings.I will make Dr.Cooper aware.

## 2024-11-20 NOTE — Telephone Encounter (Signed)
 Pt c/o BP issue: STAT if pt c/o blurred vision, one-sided weakness or slurred speech.  STAT if BP is GREATER than 180/120 TODAY.  STAT if BP is LESS than 90/60 and SYMPTOMATIC TODAY  1. What is your BP concern? Hypertension   2. Have you taken any BP medication today?Yes   3. What are your last 5 BP readings?138/96 today  159/98 yesterday  155/90 on 1/3  4. Are you having any other symptoms (ex. Dizziness, headache, blurred vision, passed out)? Headache   Pt states even after medication change his BP has still been trending high. Please advise.

## 2024-11-27 NOTE — Progress Notes (Unsigned)
 " Cardiology Office Note   Date:  11/29/2024  ID:  Walter JAYSON Doyne, MD, DOB 1946-04-10, MRN [id number] PCP: [name], PA  [facility] Providers Cardiologist:  [name], MD Electrophysiologist:  [name], MD (Inactive)     History of Present Illness Walter JAYSON Doyne, MD is a 79 y.o. male with history of CAD s/p PCI (LCx 2013, proximal LAD for 95% stenosis 2018), NICM (lowest EF 35-40% 10/2016 and now 60-65% 11/2023), CHB s/p CRTP, a-fib/a flutter s/p ablations X4, hypertension, and hyperlipidemia with statin intolerance.     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 Fell for CAD and Dr. [name] for [facility] and A-fib. 11/2015 Lexi stress test that was a low risk study with no ischemia or infarction and decreased stress EF of 47%.    Echo 02/2017 LVEF 40-45%, mild LVH, systolic function mildly to moderately reduced, no RWMA, trivial AR, aortic root dilation of 39 mm, mild MR, LA moderately dilated, RA severely dilated, RV mildly dilated, and mild TR. Cardiac catheterization 07/2017 showed severe single-vessel CAD with critical stenosis to proximal LAD.  Successful PCI using 3.5 X 15 mm Xience [place] DES.  Moderate residual CAD of RCA and mid LAD, widely patent DES in LCx with CTO of the distal AV circumflex collateralized by the OM branches.  Cardiac catheterization 11/2018 shows severe mid LAD stenosis treated successfully with PCI using a 2.5 X20 6 mm Onyx DES.  Widely patent proximal LAD and LCx DES with CTO of the circumflex subbranch collaterals by left to left collaterals. Moderate stenosis of the distal RCA is unchanged with 50% stenosis.  Echo 04/2022 LVEF 50-55%, no RWMA, moderate LVH, RV normal, RA mild to moderately dilated, trivial MR, and mild AR.  Cardiac catheterization 10/2023 shows proximal and mid LAD stents widely patent, patent LCx stent, CTO of the distal AV circumflex fed by left to left collaterals, and  moderate distal RCA stenosis.  Recommendations to continue medical therapy.  Most recent echo 11/2023 LVEF 60 to 65%, no RWMA, moderate LVH, LA severely dilated, RA mildly dilated, trivial MR, mild to moderate TR, mild dilation of ascending aorta measuring 41 mm, and trivial AR. He was last seen in office 07/2024 by Dr. Fell and appeared to be doing well from a cardiac perspective.  He was seen by Dr. [name] 10/2024 and was stable.    He presents today for follow-up in the setting of hypertension. He just started amlodipine  5mg  11/12/24 at dinner after calling about episodes of hypertension at home. His BP continues to be elevated in the evening. He notes taking his BP one hour after his amlodipine  evening dose. He notes doing well from a cardiac perspective. He denies chest pain, shortness of breath, lower extremity edema, fatigue, palpitations, melena, hematuria, hemoptysis, diaphoresis, weakness, presyncope, syncope, orthopnea, and PND.  ROS: All systems negative unless otherwise indicated in HPI.   Studies Reviewed EKG Interpretation Date/Time:  Wednesday November 29 2024 08:11:08 EST Ventricular Rate:  75 PR Interval:  160 QRS Duration:  100 QT Interval:  384 QTC Calculation: 428 R Axis:   131  Text Interpretation: AV dual-paced rhythm Biventricular pacemaker detected When compared with ECG of 12-Jun-2024 08:12, Vent. rate has increased BY   3 BPM Confirmed by Teresa Fish [id number] on 11/29/2024 8:12:23 AM    Cardiac Studies & Procedures   ______________________________________________________________________________________________ CARDIAC CATHETERIZATION  CARDIAC CATHETERIZATION 10/21/2023  Conclusion   Dist LAD lesion is  50% stenosed.   Prox Cx to Mid Cx lesion is 10% stenosed.   Dist Cx lesion is 100% stenosed.   Dist RCA lesion is 50% stenosed.   Prox LAD to Mid LAD lesion is 40% stenosed.   Non-stenotic Prox LAD lesion was previously treated.   Non-stenotic Mid LAD to Dist  LAD lesion was previously treated.   LV end diastolic pressure is normal.   The left ventricular ejection fraction is 50-55% by visual estimate.  1.  Widely patent left main with no stenosis 2.  Patent LAD with patent stents in the proximal and mid vessel, mild nonobstructive plaquing in the segment between the stents, all unchanged from the prior study 4 years earlier 3.  Patent left circumflex stent with no stenosis.  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  Moderate distal RCA stenosis, angiographically stable 5.  Normal LV function with LVEF estimated at 55% without regional wall motion abnormalities.  Recommendations: Continue medical therapy.  Resume apixaban  tomorrow morning at normal dosing schedule.  Findings Coronary Findings Diagnostic  Dominance: Right  Left Anterior Descending The LAD remains patent, with widely patent stents in the proximal and mid-vessel. The intervening segment between the stents has mild diffuse disease of 40% stenosis between the 2 stents. Non-stenotic Prox LAD lesion was previously treated. The lesion is discrete. Proximal LAD stent remains widely patent. Prox LAD to Mid LAD lesion is 40% stenosed. Non-stenotic Mid LAD to Dist LAD lesion was previously treated. The lesion is eccentric and irregular. The lesion is calcified. diffuse stenosis Dist LAD lesion is 50% stenosed.  Left Circumflex Occluded as the AV circumflex arises from the stent site (unable to accurately depict in the diagram). The mid-circumflex stented segment remains widely patent. Collaterals Dist Cx filled by collaterals from 2nd Mrg.  Prox Cx to Mid Cx lesion is 10% stenosed. The lesion was previously treated using a drug eluting stent over 2 years ago. The AV circumflex is occluded at the stented segment. The stent is widely patent and extends into the OM which divides into 2 branches. The distal  circumflex is collateralized from the OM branches. This is unchanged from the last cath study. Dist Cx lesion is 100% stenosed. chronic occlusion, unchanged from prior studies. Circumflex stent remains patent.  Right Coronary Artery There is mild diffuse disease throughout the vessel. Dist RCA lesion is 50% stenosed. Unchanged from previous cath studies  Intervention  No interventions have been documented.   CARDIAC CATHETERIZATION  CARDIAC CATHETERIZATION 12/13/2018  Conclusion  Previously placed Prox LAD drug eluting stent is widely patent.  Balloon angioplasty was performed.  Mid LAD to Dist LAD lesion is 95% stenosed.  Dist LAD lesion is 50% stenosed.  Prox Cx to Mid Cx lesion is 10% stenosed.  Dist Cx lesion is 100% stenosed.  Dist RCA lesion is 50% stenosed.  A drug-eluting stent was successfully placed using a STENT RESOLUTE ONYX 2.5X26.  Post intervention, there is a 0% residual stenosis.  There is mild left ventricular systolic dysfunction.  The left ventricular ejection fraction is 50-55% by visual estimate.  1.  Severe mid LAD stenosis treated successfully with PCI using a 2.5 x 26 mm resolute Onyx DES, postdilated to high pressure with a 3.0 mm noncompliant balloon 2.  Continued patency of the stented segment in the proximal LAD with no significant restenosis 3.  Continued patency of the stented segment in the left circumflex  with chronic occlusion of the circumflex subbranch collateralized by left to left collaterals 4.  Moderate stenosis of the distal RCA, unchanged from previous studies, estimated at 50% 5.  Mild global LV systolic dysfunction with LVEF estimated at 50 to 55%  Recommendations: Resume apixaban  tomorrow, continue aspirin  81 mg daily x1 month, continue clopidogrel  75 mg daily x6 months.  The patient has had multiple ACS events.  If he is tolerating a combination of apixaban  and clopidogrel  without significant bleeding problems, it might be  reasonable to continue longer than 6 months.  Findings Coronary Findings Diagnostic  Dominance: Right  Left Anterior Descending Previously placed Prox LAD drug eluting stent is widely patent. The lesion is discrete. Proximal LAD stent remains widely patent. Mid LAD to Dist LAD lesion is 95% stenosed. The lesion is eccentric and irregular. The lesion is calcified. diffuse stenosis Dist LAD lesion is 50% stenosed.  Left Circumflex Occluded as the AV circumflex arises from the stent site (unable to accurately depict in the diagram) Collaterals Dist Cx filled by collaterals from 2nd Mrg.  Prox Cx to Mid Cx lesion is 10% stenosed. The lesion was previously treated using a drug eluting stent over 2 years ago. The AV circumflex is occluded at the stented segment. The stent is widely patent and extends into the OM which divides into 2 branches. The distal circumflex is collateralized from the OM branches. This is unchanged from the last cath study. Dist Cx lesion is 100% stenosed. chronic occlusion, unchanged from prior studies. Circumflex stent remains patent.  Right Coronary Artery There is mild diffuse disease throughout the vessel. Dist RCA lesion is 50% stenosed. Unchanged from the previous studies in 2013 and 2018  Intervention  Mid LAD to Dist LAD lesion Stent CATHETER LAUNCHER 6FREBU 3.5 guide catheter was inserted. Lesion crossed with guidewire using a WIRE COUGAR XT STRL 190CM. Pre-stent angioplasty was performed using a BALLOON SAPPHIRE 2.0X12. A drug-eluting stent was successfully placed using a STENT RESOLUTE ONYX 2.5X26. Post-stent angioplasty was performed using a BALLOON SAPPHIRE [place] 3.0X8. Maximum pressure:  20 atm. After stent deployment, there is no reflow in the vessel.  Intracoronary verapamil  was administered.  Flow returns to normal, TIMI-3.  Angiography is performed and the midportion of the stent appears underexpanded.  The stent had initially been postdilated with a 3.0 x  15 mm noncompliant balloon.  A 3.0 x 8 mm noncompliant balloon was used to dilate the midportion of the stent to 20 atm on 2 inflations.  This greatly improved the angiographic appearance of stent expansion throughout the stent with 0% residual stenosis and TIMI-3 flow at the completion of the procedure. Post-Intervention Lesion Assessment The intervention was successful. Pre-interventional TIMI flow is 3. Post-intervention TIMI flow is 3. No complications occurred at this lesion. There is a 0% residual stenosis post intervention.   STRESS TESTS  MYOCARDIAL PERFUSION IMAGING 12/05/2015  Interpretation Summary  Nuclear stress EF: 47%.  This is a low risk study.  The left ventricular ejection fraction is mildly decreased (45-54%).  Low risk stress nuclear study with no ischemia or infarction; EF 47 with septal hypokinesis; mild LVE.   ECHOCARDIOGRAM  ECHOCARDIOGRAM COMPLETE 12/16/2023  Narrative ECHOCARDIOGRAM REPORT    Patient Name:   DR. NORLEEN JAYSON [name] Date of Exam: 12/16/2023 Medical Rec #:  [id number]        Height:       72.0 in Accession #:    [id number]       Weight:  181.6 lb Date of Birth:  [birth year]         BSA:          2.045 m Patient Age:    33 years         BP:           120/84 mmHg Patient Gender: M                HR:           76 bpm. Exam Location:  [address]  Procedure: 2D Echo, Cardiac Doppler, Color Doppler and Strain Analysis  Indications:    I48.1 Persistent atrial fibrillation  History:        Patient has prior history of Echocardiogram examinations, most recent 04/22/2022. CAD, Pacemaker, Arrythmias:Atrial Flutter, Signs/Symptoms:Fatigue; Risk Factors:Hypertension and Dyslipidemia. Chest discomfort. Junctional premature beats. Atrial tachycardia. First degree AV block. Complete heart block. NICM. Bilateral ankle edema.  Sonographer:    Jon Hacker RCS Referring Phys: [phone] MICHAEL COOPER  IMPRESSIONS   1. Left ventricular ejection  fraction, by estimation, is 60 to 65%. The left ventricle has normal function. The left ventricle has no regional wall motion abnormalities. There is moderate concentric left ventricular hypertrophy. Left ventricular diastolic parameters are indeterminate. The average left ventricular global longitudinal strain is -20.3 %. The global longitudinal strain is normal. 2. Right ventricular systolic function is normal. The right ventricular size is normal. There is normal pulmonary artery systolic pressure. 3. Left atrial size was severely dilated. 4. Right atrial size was moderately dilated. 5. The mitral valve is normal in structure. Trivial mitral valve regurgitation. No evidence of mitral stenosis. 6. Tricuspid valve regurgitation is mild to moderate. 7. The aortic valve is tricuspid. Aortic valve regurgitation is trivial. No aortic stenosis is present. 8. Aortic dilatation noted. There is borderline dilatation of the aortic root, measuring 39 mm. There is mild dilatation of the ascending aorta, measuring 41 mm. 9. The inferior vena cava is normal in size with greater than 50% respiratory variability, suggesting right atrial pressure of 3 mmHg.  FINDINGS Left Ventricle: Left ventricular ejection fraction, by estimation, is 60 to 65%. The left ventricle has normal function. The left ventricle has no regional wall motion abnormalities. The average left ventricular global longitudinal strain is -20.3 %. The global longitudinal strain is normal. The left ventricular internal cavity size was normal in size. There is moderate concentric left ventricular hypertrophy. Left ventricular diastolic parameters are indeterminate.  Right Ventricle: The right ventricular size is normal. No increase in right ventricular wall thickness. Right ventricular systolic function is normal. There is normal pulmonary artery systolic pressure. The tricuspid regurgitant velocity is 2.29 m/s, and with an assumed right atrial  pressure of 3 mmHg, the estimated right ventricular systolic pressure is 24.0 mmHg.  Left Atrium: Left atrial size was severely dilated.  Right Atrium: Right atrial size was moderately dilated.  Pericardium: There is no evidence of pericardial effusion.  Mitral Valve: The mitral valve is normal in structure. Trivial mitral valve regurgitation. No evidence of mitral valve stenosis.  Tricuspid Valve: The tricuspid valve is normal in structure. Tricuspid valve regurgitation is mild to moderate. No evidence of tricuspid stenosis.  Aortic Valve: The aortic valve is tricuspid. Aortic valve regurgitation is trivial. No aortic stenosis is present.  Pulmonic Valve: The pulmonic valve was normal in structure. Pulmonic valve regurgitation is mild. No evidence of pulmonic stenosis.  Aorta: Aortic dilatation noted. There is borderline dilatation of the aortic root, measuring 39 mm.  There is mild dilatation of the ascending aorta, measuring 41 mm.  Venous: The inferior vena cava is normal in size with greater than 50% respiratory variability, suggesting right atrial pressure of 3 mmHg.  IAS/Shunts: No atrial level shunt detected by color flow Doppler.  Additional Comments: A device lead is visualized.   LEFT VENTRICLE PLAX 2D LVIDd:         3.80 cm LVIDs:         2.50 cm   2D Longitudinal Strain LV PW:         1.40 cm   2D Strain GLS (A2C):   -21.5 % LV IVS:        1.40 cm   2D Strain GLS (A3C):   -19.6 % LVOT diam:     2.60 cm   2D Strain GLS (A4C):   -19.7 % LV SV:         87        2D Strain GLS Avg:     -20.3 % LV SV Index:   42 LVOT Area:     5.31 cm   RIGHT VENTRICLE RV Basal diam:  3.70 cm RV S prime:     10.16 cm/s TAPSE (M-mode): 0.8 cm RVSP:           24.0 mmHg  LEFT ATRIUM             Index        RIGHT ATRIUM           Index LA diam:        5.20 cm 2.54 cm/m   RA Pressure: 3.00 mmHg LA Vol (A2C):   66.9 ml 32.71 ml/m  RA Area:     26.10 cm LA Vol (A4C):   86.7 ml 42.40  ml/m  RA Volume:   78.20 ml  38.24 ml/m LA Biplane Vol: 76.4 ml 37.36 ml/m AORTIC VALVE             PULMONIC VALVE LVOT Vmax:   85.20 cm/s  PR End Diast Vel: 5.57 msec LVOT Vmean:  51.733 cm/s LVOT VTI:    0.163 m  AORTA Ao Root diam: 3.90 cm Ao Asc diam:  4.10 cm  TRICUSPID VALVE TR Peak grad:   21.0 mmHg TR Vmax:        229.00 cm/s Estimated RAP:  3.00 mmHg RVSP:           24.0 mmHg  SHUNTS Systemic VTI:  0.16 m Systemic Diam: 2.60 cm  Annabella Scarce MD Electronically signed by Annabella Scarce MD Signature Date/Time: 12/16/2023/7:48:14 PM    Final          ______________________________________________________________________________________________      Risk Assessment/Calculations           Physical Exam VS:  BP 124/78 (BP Location: Left Arm, Patient Position: Sitting, Cuff Size: Normal)   Pulse 75   Ht 5' 11 (1.803 m)   Wt 179 lb 6.4 oz (81.4 kg)   SpO2 98%   BMI 25.02 kg/m        Wt Readings from Last 3 Encounters:  11/29/24 179 lb 6.4 oz (81.4 kg)  10/27/24 177 lb (80.3 kg)  08/04/24 178 lb 3.2 oz (80.8 kg)    GEN: Well nourished, well developed in no acute distress NECK: No JVD; No carotid bruits CARDIAC: Paced rhythm, no murmurs, rubs, gallops RESPIRATORY:  Clear to auscultation without rales, wheezing or rhonchi  ABDOMEN: Soft, non-tender, non-distended EXTREMITIES:  No edema; No deformity  ASSESSMENT AND PLAN  Hypertension- He called the office with high BP of 130-140's in the evening. He checks his BP in the morning after taking his losartan  with SBP readings of low 100's and in the evenings after taking amlodipine  with SBP readings in the 130-140's.  Continue losartan  50 mg in AM.  Increase amlodipine  7.5 mg PM.  Recheck BMP today. In case losartan  needs to be titrated in future.   CAD s/p PCI- Cardiac catheterization 10/2023 shows proximal and mid LAD stents widely patent, patent LCx stent, CTO of the distal AV circumflex fed  by left to left collaterals, and moderate distal RCA stenosis. Recommendations to continue medical therapy. EKG today shows AV dual-paced rhythm. Today he denies CP, shortness of breath, and edema. Continue aspirin  81 mg, Repatha , and amlodipine  7.5 mg.   NICM- Lowest EF 35-40% 10/2016. Echo 11/2023 LVEF 60 to 65%, no RWMA, moderate LVH, LA severely dilated, RA mildly dilated, trivial MR, mild to moderate TR, mild dilation of ascending aorta measuring 41 mm, and trivial AR. Today he denies DOE, shortness of breath, and CP.   CHB s/p CRTP- Followed by Dr. [name]. Will have gen. change out within the next month.   A-fib/A flutter- Underwent 4 ablations. Followed by EP.  Continue Eliquis  5 mg BID and amlodipine  5 mg.   HLD LDL goal <55- Last LDL 55 87/8/76. Currently not on statin therapy due to intolerance. Continue Repatha .        Dispo: Follow up with APP in 8 weeks.   Signed, Mardy KATHEE Pizza, FNP  "

## 2024-11-29 ENCOUNTER — Encounter: Payer: Self-pay | Admitting: General Practice

## 2024-11-29 ENCOUNTER — Ambulatory Visit: Attending: General Practice

## 2024-11-29 VITALS — BP 124/78 | HR 75 | Ht 71.0 in | Wt 179.4 lb

## 2024-11-29 DIAGNOSIS — I4892 Unspecified atrial flutter: Secondary | ICD-10-CM | POA: Diagnosis not present

## 2024-11-29 DIAGNOSIS — I25118 Atherosclerotic heart disease of native coronary artery with other forms of angina pectoris: Secondary | ICD-10-CM | POA: Diagnosis not present

## 2024-11-29 DIAGNOSIS — I428 Other cardiomyopathies: Secondary | ICD-10-CM | POA: Insufficient documentation

## 2024-11-29 DIAGNOSIS — E785 Hyperlipidemia, unspecified: Secondary | ICD-10-CM | POA: Diagnosis not present

## 2024-11-29 DIAGNOSIS — I4891 Unspecified atrial fibrillation: Secondary | ICD-10-CM | POA: Insufficient documentation

## 2024-11-29 DIAGNOSIS — I442 Atrioventricular block, complete: Secondary | ICD-10-CM | POA: Diagnosis not present

## 2024-11-29 DIAGNOSIS — I10 Essential (primary) hypertension: Secondary | ICD-10-CM | POA: Insufficient documentation

## 2024-11-29 LAB — BASIC METABOLIC PANEL WITH GFR
BUN/Creatinine Ratio: 17 (ref 10–24)
BUN: 18 mg/dL (ref 8–27)
CO2: 23 mmol/L (ref 20–29)
Calcium: 9.4 mg/dL (ref 8.6–10.2)
Chloride: 99 mmol/L (ref 96–106)
Creatinine, Ser: 1.08 mg/dL (ref 0.76–1.27)
Glucose: 95 mg/dL (ref 70–99)
Potassium: 4.6 mmol/L (ref 3.5–5.2)
Sodium: 136 mmol/L (ref 134–144)
eGFR: 69 mL/min/1.73

## 2024-11-29 MED ORDER — AMLODIPINE BESYLATE 5 MG PO TABS: 7.5000 mg | ORAL_TABLET | Freq: Every day | ORAL | Status: DC

## 2024-11-29 MED ORDER — AMLODIPINE BESYLATE 10 MG PO TABS: 10.0000 mg | ORAL_TABLET | Freq: Every day | ORAL | 3 refills | Status: DC

## 2024-11-29 NOTE — Patient Instructions (Signed)
 Medication Instructions:  INCREASE AMLODIPINE  TO 7.5 MG DAILY *If you need a refill on your cardiac medications before your next appointment, please call your pharmacy*  Lab Work: BMP TODAY  If you have labs (blood work) drawn today and your tests are completely normal, you will receive your results only by: MyChart Message (if you have MyChart) OR A paper copy in the mail If you have any lab test that is abnormal or we need to change your treatment, we will call you to review the results.  Testing/Procedures: NO TESTING  Follow-Up: At [facility], you and your health needs are our priority.  As part of our continuing mission to provide you with exceptional heart care, our providers are all part of one team.  This team includes your primary Cardiologist (physician) and Advanced Practice Providers or APPs (Physician Assistants and Nurse Practitioners) who all work together to provide you with the care you need, when you need it.  Your next appointment:   8 week(s)  Provider:   [name], MD  or Josefa Beauvais, NP

## 2024-11-30 ENCOUNTER — Ambulatory Visit: Payer: Self-pay

## 2024-12-04 ENCOUNTER — Telehealth: Payer: Self-pay

## 2024-12-04 DIAGNOSIS — I10 Essential (primary) hypertension: Secondary | ICD-10-CM

## 2024-12-04 MED ORDER — AMLODIPINE BESYLATE 5 MG PO TABS: 7.5000 mg | ORAL_TABLET | Freq: Every day | ORAL | 1 refills | Status: AC

## 2024-12-04 NOTE — Telephone Encounter (Signed)
 Pt c/o medication issue:  1. Name of Medication:  amLODipine  (NORVASC ) 5 MG tablet   2. How are you currently taking this medication (dosage and times per day)? Patient states he has not taken it yet due to wrong prescription   3. Are you having a reaction (difficulty breathing--STAT)? No  4. What is your medication issue? Patient states he was suppose to receive amLODipine  (NORVASC ) 5 MG tablet but the prescription was amLODipine  (NORVASC ) 10mg  Tablet when he picked it up from the pharmacy

## 2024-12-04 NOTE — Telephone Encounter (Signed)
 Med list was updated after last visit but no new Rx sent. Rx(s) sent to pharmacy electronically. MyChart message sent to patient.

## 2024-12-14 ENCOUNTER — Telehealth: Payer: Self-pay

## 2024-12-14 DIAGNOSIS — I442 Atrioventricular block, complete: Secondary | ICD-10-CM

## 2024-12-14 NOTE — Telephone Encounter (Signed)
 [employer] CRTP ALERT:   ERI REACHED ON 12/14/24  LM for patient to call back and discuss (make him aware he has officially reached RRT). Forwarding to [employer] and April Garrison to contact him regarding scheduling gen change.  Saw Dr. [name] in December and pre-op discussion has been performed.

## 2024-12-14 NOTE — Telephone Encounter (Signed)
Pt left vm returning call. °

## 2024-12-14 NOTE — Telephone Encounter (Signed)
 Spoke with the patient and scheduled him for a gen change on 2/27 with Dr. [name].

## 2024-12-15 NOTE — Telephone Encounter (Signed)
 Per Dr. [name] - hold for 3 days.

## 2024-12-16 ENCOUNTER — Ambulatory Visit: Attending: Cardiology

## 2024-12-18 ENCOUNTER — Telehealth: Payer: Self-pay

## 2024-12-19 LAB — CUP PACEART REMOTE DEVICE CHECK
Battery Remaining Longevity: 1 mo — CL
Battery Voltage: 2.6 V
Brady Statistic AP VP Percent: 98.89 %
Brady Statistic AP VS Percent: 0 %
Brady Statistic AS VP Percent: 0.95 %
Brady Statistic AS VS Percent: 0.15 %
Brady Statistic RA Percent Paced: 99.08 %
Brady Statistic RV Percent Paced: 99.84 %
Date Time Interrogation Session: 20260131034326
Implantable Lead Connection Status: 753985
Implantable Lead Connection Status: 753985
Implantable Lead Connection Status: 753985
Implantable Lead Implant Date: 20130517
Implantable Lead Implant Date: 20130710
Implantable Lead Implant Date: 20170802
Implantable Lead Location: 753858
Implantable Lead Location: 753859
Implantable Lead Location: 753860
Implantable Pulse Generator Implant Date: 20170802
Lead Channel Impedance Value: 1045 Ohm
Lead Channel Impedance Value: 1083 Ohm
Lead Channel Impedance Value: 1159 Ohm
Lead Channel Impedance Value: 323 Ohm
Lead Channel Impedance Value: 361 Ohm
Lead Channel Impedance Value: 399 Ohm
Lead Channel Impedance Value: 418 Ohm
Lead Channel Impedance Value: 437 Ohm
Lead Channel Impedance Value: 456 Ohm
Lead Channel Impedance Value: 570 Ohm
Lead Channel Impedance Value: 703 Ohm
Lead Channel Impedance Value: 760 Ohm
Lead Channel Impedance Value: 893 Ohm
Lead Channel Impedance Value: 893 Ohm
Lead Channel Pacing Threshold Amplitude: 0.75 V
Lead Channel Pacing Threshold Amplitude: 1.125 V
Lead Channel Pacing Threshold Amplitude: 1.375 V
Lead Channel Pacing Threshold Pulse Width: 0.4 ms
Lead Channel Pacing Threshold Pulse Width: 0.4 ms
Lead Channel Pacing Threshold Pulse Width: 0.8 ms
Lead Channel Sensing Intrinsic Amplitude: 0.25 mV
Lead Channel Sensing Intrinsic Amplitude: 0.25 mV
Lead Channel Sensing Intrinsic Amplitude: 14.25 mV
Lead Channel Sensing Intrinsic Amplitude: 14.25 mV
Lead Channel Setting Pacing Amplitude: 2 V
Lead Channel Setting Pacing Amplitude: 2.25 V
Lead Channel Setting Pacing Amplitude: 2.5 V
Lead Channel Setting Pacing Pulse Width: 0.4 ms
Lead Channel Setting Pacing Pulse Width: 1 ms
Lead Channel Setting Sensing Sensitivity: 4 mV
Zone Setting Status: 755011
Zone Setting Status: 755011

## 2025-01-10 ENCOUNTER — Ambulatory Visit: Payer: Medicare Other

## 2025-01-12 ENCOUNTER — Encounter (HOSPITAL_COMMUNITY): Payer: Self-pay

## 2025-01-12 ENCOUNTER — Ambulatory Visit (HOSPITAL_COMMUNITY): Admit: 2025-01-12 | Admitting: Cardiology

## 2025-01-16 ENCOUNTER — Ambulatory Visit

## 2025-02-08 ENCOUNTER — Ambulatory Visit: Admitting: General Practice

## 2025-04-11 ENCOUNTER — Ambulatory Visit: Payer: Medicare Other
